# Patient Record
Sex: Female | Born: 1945 | Race: White | Hispanic: No | Smoking: Never smoker
Health system: Southern US, Community
[De-identification: ages and names within clinical notes are randomized; demographics above are authoritative.]

## PROBLEM LIST (undated history)

## (undated) DIAGNOSIS — M1990 Unspecified osteoarthritis, unspecified site: Secondary | ICD-10-CM

## (undated) DIAGNOSIS — G629 Polyneuropathy, unspecified: Secondary | ICD-10-CM

## (undated) DIAGNOSIS — H409 Unspecified glaucoma: Secondary | ICD-10-CM

## (undated) DIAGNOSIS — E785 Hyperlipidemia, unspecified: Secondary | ICD-10-CM

## (undated) DIAGNOSIS — M5412 Radiculopathy, cervical region: Secondary | ICD-10-CM

## (undated) DIAGNOSIS — F419 Anxiety disorder, unspecified: Secondary | ICD-10-CM

## (undated) DIAGNOSIS — E669 Obesity, unspecified: Secondary | ICD-10-CM

## (undated) DIAGNOSIS — F32A Depression, unspecified: Secondary | ICD-10-CM

## (undated) DIAGNOSIS — I10 Essential (primary) hypertension: Secondary | ICD-10-CM

## (undated) DIAGNOSIS — E119 Type 2 diabetes mellitus without complications: Secondary | ICD-10-CM

## (undated) DIAGNOSIS — T7840XA Allergy, unspecified, initial encounter: Secondary | ICD-10-CM

## (undated) DIAGNOSIS — M109 Gout, unspecified: Secondary | ICD-10-CM

## (undated) DIAGNOSIS — G4730 Sleep apnea, unspecified: Secondary | ICD-10-CM

## (undated) DIAGNOSIS — I129 Hypertensive chronic kidney disease with stage 1 through stage 4 chronic kidney disease, or unspecified chronic kidney disease: Secondary | ICD-10-CM

## (undated) DIAGNOSIS — K219 Gastro-esophageal reflux disease without esophagitis: Secondary | ICD-10-CM

## (undated) DIAGNOSIS — E1129 Type 2 diabetes mellitus with other diabetic kidney complication: Secondary | ICD-10-CM

## (undated) DIAGNOSIS — E042 Nontoxic multinodular goiter: Secondary | ICD-10-CM

## (undated) DIAGNOSIS — N189 Chronic kidney disease, unspecified: Secondary | ICD-10-CM

## (undated) HISTORY — DX: Type 2 diabetes mellitus without complications: E11.9

## (undated) HISTORY — PX: EYE SURGERY: SHX253

## (undated) HISTORY — DX: Depression, unspecified: F32.A

## (undated) HISTORY — DX: Essential (primary) hypertension: I10

## (undated) HISTORY — DX: Unspecified glaucoma: H40.9

## (undated) HISTORY — DX: Type 2 diabetes mellitus with other diabetic kidney complication: E11.29

## (undated) HISTORY — PX: CHOLECYSTECTOMY: SHX55

## (undated) HISTORY — DX: Chronic kidney disease, unspecified: N18.9

## (undated) HISTORY — DX: Hyperlipidemia, unspecified: E78.5

## (undated) HISTORY — PX: BREAST CYST ASPIRATION: SHX578

## (undated) HISTORY — PX: HERNIA REPAIR: SHX51

## (undated) HISTORY — DX: Sleep apnea, unspecified: G47.30

## (undated) HISTORY — DX: Anxiety disorder, unspecified: F41.9

## (undated) HISTORY — DX: Obesity, unspecified: E66.9

## (undated) HISTORY — PX: ABDOMINAL HYSTERECTOMY: SHX81

## (undated) HISTORY — DX: Gout, unspecified: M10.9

## (undated) HISTORY — DX: Allergy, unspecified, initial encounter: T78.40XA

## (undated) HISTORY — PX: SPINE SURGERY: SHX786

## (undated) HISTORY — DX: Hypertensive chronic kidney disease with stage 1 through stage 4 chronic kidney disease, or unspecified chronic kidney disease: I12.9

## (undated) HISTORY — PX: THUMB FUSION: SUR636

## (undated) HISTORY — DX: Gastro-esophageal reflux disease without esophagitis: K21.9

---

## 2009-08-09 LAB — HM COLONOSCOPY

## 2011-06-15 ENCOUNTER — Ambulatory Visit: Payer: Self-pay | Admitting: Nephrology

## 2011-12-17 ENCOUNTER — Ambulatory Visit: Payer: Self-pay

## 2012-06-16 ENCOUNTER — Ambulatory Visit: Payer: Self-pay

## 2013-01-29 DIAGNOSIS — H5200 Hypermetropia, unspecified eye: Secondary | ICD-10-CM | POA: Diagnosis not present

## 2013-01-29 DIAGNOSIS — H00029 Hordeolum internum unspecified eye, unspecified eyelid: Secondary | ICD-10-CM | POA: Diagnosis not present

## 2013-01-29 DIAGNOSIS — E119 Type 2 diabetes mellitus without complications: Secondary | ICD-10-CM | POA: Diagnosis not present

## 2013-04-15 DIAGNOSIS — Z23 Encounter for immunization: Secondary | ICD-10-CM | POA: Diagnosis not present

## 2013-04-15 DIAGNOSIS — Z1331 Encounter for screening for depression: Secondary | ICD-10-CM | POA: Diagnosis not present

## 2013-04-15 DIAGNOSIS — Z0000 Encounter for general adult medical examination without abnormal findings: Secondary | ICD-10-CM | POA: Diagnosis not present

## 2013-04-15 DIAGNOSIS — E119 Type 2 diabetes mellitus without complications: Secondary | ICD-10-CM | POA: Diagnosis not present

## 2013-04-15 DIAGNOSIS — E785 Hyperlipidemia, unspecified: Secondary | ICD-10-CM | POA: Diagnosis not present

## 2013-04-15 DIAGNOSIS — I10 Essential (primary) hypertension: Secondary | ICD-10-CM | POA: Diagnosis not present

## 2013-06-16 DIAGNOSIS — H3530 Unspecified macular degeneration: Secondary | ICD-10-CM | POA: Diagnosis not present

## 2013-06-17 ENCOUNTER — Ambulatory Visit: Payer: Self-pay

## 2013-06-17 DIAGNOSIS — Z1231 Encounter for screening mammogram for malignant neoplasm of breast: Secondary | ICD-10-CM | POA: Diagnosis not present

## 2013-09-21 DIAGNOSIS — I10 Essential (primary) hypertension: Secondary | ICD-10-CM | POA: Diagnosis not present

## 2013-09-21 DIAGNOSIS — E1165 Type 2 diabetes mellitus with hyperglycemia: Secondary | ICD-10-CM | POA: Diagnosis not present

## 2013-09-21 DIAGNOSIS — E1129 Type 2 diabetes mellitus with other diabetic kidney complication: Secondary | ICD-10-CM | POA: Diagnosis not present

## 2013-09-21 DIAGNOSIS — E119 Type 2 diabetes mellitus without complications: Secondary | ICD-10-CM | POA: Diagnosis not present

## 2013-09-21 DIAGNOSIS — I129 Hypertensive chronic kidney disease with stage 1 through stage 4 chronic kidney disease, or unspecified chronic kidney disease: Secondary | ICD-10-CM | POA: Diagnosis not present

## 2013-09-21 DIAGNOSIS — N181 Chronic kidney disease, stage 1: Secondary | ICD-10-CM | POA: Diagnosis not present

## 2013-09-21 DIAGNOSIS — E785 Hyperlipidemia, unspecified: Secondary | ICD-10-CM | POA: Diagnosis not present

## 2013-12-02 DIAGNOSIS — E119 Type 2 diabetes mellitus without complications: Secondary | ICD-10-CM | POA: Diagnosis not present

## 2013-12-02 DIAGNOSIS — H353190 Nonexudative age-related macular degeneration, unspecified eye, stage unspecified: Secondary | ICD-10-CM | POA: Diagnosis not present

## 2013-12-02 DIAGNOSIS — H2510 Age-related nuclear cataract, unspecified eye: Secondary | ICD-10-CM | POA: Diagnosis not present

## 2013-12-02 DIAGNOSIS — H25019 Cortical age-related cataract, unspecified eye: Secondary | ICD-10-CM | POA: Diagnosis not present

## 2013-12-22 DIAGNOSIS — H2510 Age-related nuclear cataract, unspecified eye: Secondary | ICD-10-CM | POA: Diagnosis not present

## 2013-12-22 DIAGNOSIS — H259 Unspecified age-related cataract: Secondary | ICD-10-CM | POA: Diagnosis not present

## 2013-12-22 DIAGNOSIS — H269 Unspecified cataract: Secondary | ICD-10-CM | POA: Diagnosis not present

## 2013-12-23 DIAGNOSIS — H2510 Age-related nuclear cataract, unspecified eye: Secondary | ICD-10-CM | POA: Diagnosis not present

## 2013-12-24 DIAGNOSIS — M79609 Pain in unspecified limb: Secondary | ICD-10-CM | POA: Diagnosis not present

## 2013-12-24 DIAGNOSIS — M659 Synovitis and tenosynovitis, unspecified: Secondary | ICD-10-CM | POA: Diagnosis not present

## 2014-01-18 DIAGNOSIS — H2510 Age-related nuclear cataract, unspecified eye: Secondary | ICD-10-CM | POA: Diagnosis not present

## 2014-01-26 DIAGNOSIS — H2510 Age-related nuclear cataract, unspecified eye: Secondary | ICD-10-CM | POA: Diagnosis not present

## 2014-02-16 DIAGNOSIS — H35359 Cystoid macular degeneration, unspecified eye: Secondary | ICD-10-CM | POA: Diagnosis not present

## 2014-04-20 DIAGNOSIS — Z23 Encounter for immunization: Secondary | ICD-10-CM | POA: Diagnosis not present

## 2014-04-20 DIAGNOSIS — Z0000 Encounter for general adult medical examination without abnormal findings: Secondary | ICD-10-CM | POA: Diagnosis not present

## 2014-04-20 DIAGNOSIS — E1121 Type 2 diabetes mellitus with diabetic nephropathy: Secondary | ICD-10-CM | POA: Diagnosis not present

## 2014-04-20 DIAGNOSIS — R000 Tachycardia, unspecified: Secondary | ICD-10-CM | POA: Diagnosis not present

## 2014-04-20 DIAGNOSIS — E669 Obesity, unspecified: Secondary | ICD-10-CM | POA: Diagnosis not present

## 2014-04-20 DIAGNOSIS — I129 Hypertensive chronic kidney disease with stage 1 through stage 4 chronic kidney disease, or unspecified chronic kidney disease: Secondary | ICD-10-CM | POA: Diagnosis not present

## 2014-06-08 DIAGNOSIS — H40013 Open angle with borderline findings, low risk, bilateral: Secondary | ICD-10-CM | POA: Diagnosis not present

## 2014-06-08 DIAGNOSIS — H3531 Nonexudative age-related macular degeneration: Secondary | ICD-10-CM | POA: Diagnosis not present

## 2014-06-08 LAB — HM DIABETES EYE EXAM

## 2014-07-20 DIAGNOSIS — G4733 Obstructive sleep apnea (adult) (pediatric): Secondary | ICD-10-CM | POA: Diagnosis not present

## 2014-07-20 DIAGNOSIS — I129 Hypertensive chronic kidney disease with stage 1 through stage 4 chronic kidney disease, or unspecified chronic kidney disease: Secondary | ICD-10-CM | POA: Diagnosis not present

## 2014-07-29 ENCOUNTER — Ambulatory Visit: Payer: Self-pay

## 2014-07-29 DIAGNOSIS — Z1231 Encounter for screening mammogram for malignant neoplasm of breast: Secondary | ICD-10-CM | POA: Diagnosis not present

## 2014-07-29 LAB — HM MAMMOGRAPHY

## 2014-11-02 DIAGNOSIS — E1121 Type 2 diabetes mellitus with diabetic nephropathy: Secondary | ICD-10-CM | POA: Diagnosis not present

## 2014-11-02 DIAGNOSIS — I129 Hypertensive chronic kidney disease with stage 1 through stage 4 chronic kidney disease, or unspecified chronic kidney disease: Secondary | ICD-10-CM | POA: Diagnosis not present

## 2014-11-02 DIAGNOSIS — E785 Hyperlipidemia, unspecified: Secondary | ICD-10-CM | POA: Diagnosis not present

## 2014-11-02 DIAGNOSIS — N181 Chronic kidney disease, stage 1: Secondary | ICD-10-CM | POA: Diagnosis not present

## 2014-11-02 LAB — HM DIABETES FOOT EXAM

## 2014-11-02 LAB — HEMOGLOBIN A1C: HEMOGLOBIN A1C: 6.7 % — AB (ref 4.0–6.0)

## 2014-11-02 LAB — BASIC METABOLIC PANEL: Glucose: 163 mg/dL

## 2014-12-01 ENCOUNTER — Encounter: Payer: Self-pay | Admitting: *Deleted

## 2014-12-01 ENCOUNTER — Encounter: Payer: Medicare Other | Attending: Unknown Physician Specialty | Admitting: *Deleted

## 2014-12-01 VITALS — BP 116/80 | Ht 65.0 in | Wt 222.3 lb

## 2014-12-01 DIAGNOSIS — E119 Type 2 diabetes mellitus without complications: Secondary | ICD-10-CM | POA: Diagnosis not present

## 2014-12-01 NOTE — Patient Instructions (Signed)
Check blood sugars 1 x day before breakfast or 2 hrs after supper every day  Exercise:  Continue walking for 15-30  minutes 1-2 days a week and gradually increase to 30 minutes 5 x week  Eat 3 meals day,   1-2  snacks a day Space meals 4-6 hours apart  Bring blood sugar records to the next class  Return for appointment/classes on:

## 2014-12-01 NOTE — Progress Notes (Signed)
Diabetes Self-Management Education  Visit Type: First/Initial  Appt. Start Time: 1020 Appt. End Time: 1130  12/01/2014  Catherine Hinton, identified by name and date of birth, is a 69 y.o. female with a diagnosis of Diabetes: Type 2.    ASSESSMENT Blood pressure 116/80, height 5\' 5"  (1.651 m), weight 222 lb 4.8 oz (100.835 kg). Body mass index is 36.99 kg/(m^2).  Initial Visit Information: Are you currently following a meal plan?: Yes What type of meal plan do you follow?: Limiting sweets and carbs Are you taking your medications as prescribed?: Yes Are you checking your feet?: Yes How many days per week are you checking your feet?: 7 How often do you need to have someone help you when you read instructions, pamphlets, or other written materials from your doctor or pharmacy?: 1 - Never What is the last grade level you completed in school?: college  Psychosocial: Patient Belief/Attitude about Diabetes: Other (comment) (Reports she needs to start making changes to help her health) Self-care barriers: None Self-management support: Family Patient Concerns: Nutrition/Meal planning, Monitoring, Healthy Lifestyle, Problem Solving, Glycemic Control, Weight Control Special Needs: None Preferred Learning Style: Auditory, Visual Learning Readiness: Ready  Complications:   Last HgB A1C per patient/outside source: 6.7 % (11/02/14) How often do you check your blood sugar?: 1-2 times/day Fasting Blood glucose range (mg/dL): 130-179 (Pt reports FBG's 130-140's with reading of 136 today) Have you had a dilated eye exam in the past 12 months?: Yes Have you had a dental exam in the past 12 months?: Yes  Diet Intake: Breakfast: cereal and milk; oatmeal; eggs bacon or sausage with toast Lunch: chicken, spaghetti, vegetables Dinner: left overs or cereal and milk; boiled egg Snack (evening): 0-1 snacks/day Beverage(s): mostly water  Exercise: Exercise: Light (walking) Light Exercise  amount of time (min / week): 40  Individualized Plan for Diabetes Self-Management Training:  Learning Objective:  Patient will have a greater understanding of diabetes self-management.  Education Topics Reviewed with Patient Today: Explored patient's options for treatment of their diabetes Role of diet in the treatment of diabetes and the relationship between the three main macronutrients and blood glucose level, Food label reading, portion sizes and measuring food. Role of exercise on diabetes management, blood pressure control and cardiac health. Reviewed patients medication for diabetes, action, purpose, timing of dose and side effects. Purpose and frequency of SMBG., Identified appropriate SMBG and/or A1C goals. Relationship between chronic complications and blood glucose control Identified and addressed patients feelings and concerns about diabetes  PATIENTS GOALS/Plan (Developed by the patient): Improve blood sugars Prevent diabetes complications Lose weight Lead a healthier lifestyle Become more fit  Plan:   Patient Instructions  Check blood sugars 1 x day before breakfast or 2 hrs after supper every day Exercise:  Continue walking for 15-30  minutes 1-2 days a week and gradually increase to 30 minutes 5 x week Eat 3 meals day,   1-2  snacks a day Space meals 4-6 hours apart Bring blood sugar records to the next class  Expected Outcomes:  Demonstrated interest in learning. Expect positive outcomes  Education material provided: General Meal Planning Guidelines  If problems or questions, patient to contact team via:  Catherine Hinton, [place], [place], CDE [phone]   Future DSME appointment:  Class 1 December 09, 2014

## 2014-12-09 ENCOUNTER — Ambulatory Visit: Payer: No Typology Code available for payment source

## 2014-12-09 ENCOUNTER — Encounter: Payer: Self-pay | Admitting: Dietician

## 2014-12-09 ENCOUNTER — Encounter: Payer: Medicare Other | Admitting: Dietician

## 2014-12-09 VITALS — Wt 217.4 lb

## 2014-12-09 DIAGNOSIS — E119 Type 2 diabetes mellitus without complications: Secondary | ICD-10-CM | POA: Diagnosis not present

## 2014-12-09 NOTE — Progress Notes (Signed)
Appt. Start Time: 9 Appt. End Time: 12  Class 1 Diabetes Overview - define DM; state own type of DM; identify functions of pancreas and insulin; define insulin deficiency vs insulin resistance  Psychosocial - identify DM as a source of stress; state the effects of stress on BG control; verbalize appropriate stress management techniques; identify personal stress issues   Nutritional Management - describe effects of food on blood glucose; identify sources of carbohydrate, protein and fat; verbalize the importance of balance meals in controlling blood glucose; identify meals as well balanced or not; estimate servings of carbohydrate from menus; use food labels to identify servings size, content of carbohydrate, fiber, protein, fat, saturated fat and sodium; recognize food sources of fat, saturated fat, trans fat, sodium and verbalize goals for intake; describe healthful appropriate food choices when dining out   Exercise - describe the effects of exercise on blood glucose and importance of regular exercise in controlling diabetes; state a plan for personal exercise; verbalize contraindications for exercise  Self-Monitoring - state importance of HBGM and demo procedure accurately; use HBGM results to effectively manage diabetes; identify importance of regular HbA1C testing and goals for results  Acute Complications/Sick Day Guidelines - recognize hyperglycemia and hypoglycemia with causes and effects; identify blood glucose results as high, low or in control; list steps in treating and preventing high and low blood glucose; state appropriate measure to manage blood glucose when ill (need for meds, HBGM plan, when to call physician, need for fluids)  Chronic Complications/Foot, Skin, Eye Dental Care - identify possible long-term complications of diabetes (retinopathy, neuropathy, nephropathy, cardiovascular disease, infections); explain steps in prevention and treatment of chronic complications; state  importance of daily self-foot exams; describe how to examine feet and what to look for; explain appropriate eye and dental care  Lifestyle Changes/Goals & Health/Community Resources - state benefits of making appropriate lifestyle changes; identify habits that need to change (meals, tobacco, alcohol); identify strategies to reduce risk factors for personal health; set goals for proper diabetes care; state need for and frequency of healthcare follow-up; describe appropriate community resources for good health (ADA, web sites, apps)   Pregnancy/Sexual Health - define gestational diabetes; state importance of good blood glucose control and birth control prior to pregnancy; state importance of good blood glucose control in preventing sexual problems (impotence, vaginal dryness, infections, loss of desire); state relationship of blood glucose control and pregnancy outcome; describe risk of maternal and fetal complications  Teaching Materials Used: Class 1 Slides/Notebook Diabetes Booklet ID Card  Medic Alert/Medic ID Forms Sleep Evaluation Exercise Handout Daily Food Record Planning a Balanced Meal Goals for Class 1

## 2014-12-15 DIAGNOSIS — H3531 Nonexudative age-related macular degeneration: Secondary | ICD-10-CM | POA: Diagnosis not present

## 2014-12-15 DIAGNOSIS — H40003 Preglaucoma, unspecified, bilateral: Secondary | ICD-10-CM | POA: Diagnosis not present

## 2014-12-15 LAB — HM DIABETES EYE EXAM

## 2014-12-16 ENCOUNTER — Encounter: Payer: Medicare Other | Admitting: *Deleted

## 2014-12-16 ENCOUNTER — Encounter: Payer: Self-pay | Admitting: *Deleted

## 2014-12-16 VITALS — Wt 214.5 lb

## 2014-12-16 DIAGNOSIS — E119 Type 2 diabetes mellitus without complications: Secondary | ICD-10-CM | POA: Diagnosis not present

## 2014-12-16 NOTE — Progress Notes (Signed)

## 2014-12-29 ENCOUNTER — Telehealth: Payer: Self-pay | Admitting: Unknown Physician Specialty

## 2014-12-29 NOTE — Telephone Encounter (Signed)
Refill will be filled out by provider and faxed back to company.

## 2014-12-29 NOTE — Telephone Encounter (Signed)
E-fax came through for refill on diabetic supplies. Copy of Rx in basket

## 2014-12-30 ENCOUNTER — Encounter: Payer: Medicare Other | Attending: Unknown Physician Specialty | Admitting: Dietician

## 2014-12-30 VITALS — BP 100/62 | Ht 65.0 in | Wt 209.9 lb

## 2014-12-30 DIAGNOSIS — E119 Type 2 diabetes mellitus without complications: Secondary | ICD-10-CM | POA: Insufficient documentation

## 2014-12-30 NOTE — Progress Notes (Signed)

## 2014-12-31 ENCOUNTER — Other Ambulatory Visit: Payer: Self-pay | Admitting: Unknown Physician Specialty

## 2015-01-04 ENCOUNTER — Encounter: Payer: Self-pay | Admitting: *Deleted

## 2015-01-21 DIAGNOSIS — H018 Other specified inflammations of eyelid: Secondary | ICD-10-CM | POA: Diagnosis not present

## 2015-01-21 DIAGNOSIS — H16223 Keratoconjunctivitis sicca, not specified as Sjogren's, bilateral: Secondary | ICD-10-CM | POA: Diagnosis not present

## 2015-02-01 ENCOUNTER — Other Ambulatory Visit: Payer: Self-pay | Admitting: Unknown Physician Specialty

## 2015-02-08 ENCOUNTER — Other Ambulatory Visit: Payer: Self-pay | Admitting: Unknown Physician Specialty

## 2015-02-09 ENCOUNTER — Other Ambulatory Visit: Payer: Self-pay | Admitting: Unknown Physician Specialty

## 2015-03-29 DIAGNOSIS — H04129 Dry eye syndrome of unspecified lacrimal gland: Secondary | ICD-10-CM | POA: Diagnosis not present

## 2015-04-11 ENCOUNTER — Other Ambulatory Visit: Payer: Self-pay

## 2015-04-11 MED ORDER — CYCLOBENZAPRINE HCL 10 MG PO TABS: 10.0000 mg | ORAL_TABLET | Freq: Every day | ORAL | Status: DC

## 2015-04-11 NOTE — Telephone Encounter (Signed)
Patient was last seen 11/02/14 and has follow-up scheduled in November. [facility] number is [phone] and pharmacy is [facility] in [place].

## 2015-04-18 ENCOUNTER — Other Ambulatory Visit: Payer: Self-pay | Admitting: Unknown Physician Specialty

## 2015-04-22 DIAGNOSIS — G4733 Obstructive sleep apnea (adult) (pediatric): Secondary | ICD-10-CM

## 2015-04-22 DIAGNOSIS — G4700 Insomnia, unspecified: Secondary | ICD-10-CM | POA: Insufficient documentation

## 2015-04-22 DIAGNOSIS — N181 Chronic kidney disease, stage 1: Secondary | ICD-10-CM

## 2015-04-22 DIAGNOSIS — E785 Hyperlipidemia, unspecified: Secondary | ICD-10-CM | POA: Insufficient documentation

## 2015-04-22 DIAGNOSIS — R000 Tachycardia, unspecified: Secondary | ICD-10-CM

## 2015-04-22 DIAGNOSIS — I129 Hypertensive chronic kidney disease with stage 1 through stage 4 chronic kidney disease, or unspecified chronic kidney disease: Secondary | ICD-10-CM

## 2015-04-22 DIAGNOSIS — E1140 Type 2 diabetes mellitus with diabetic neuropathy, unspecified: Secondary | ICD-10-CM | POA: Insufficient documentation

## 2015-04-22 DIAGNOSIS — E1121 Type 2 diabetes mellitus with diabetic nephropathy: Secondary | ICD-10-CM

## 2015-04-22 DIAGNOSIS — E669 Obesity, unspecified: Secondary | ICD-10-CM

## 2015-04-22 DIAGNOSIS — E1169 Type 2 diabetes mellitus with other specified complication: Secondary | ICD-10-CM | POA: Insufficient documentation

## 2015-04-25 ENCOUNTER — Encounter: Payer: Self-pay | Admitting: Unknown Physician Specialty

## 2015-04-25 ENCOUNTER — Ambulatory Visit (INDEPENDENT_AMBULATORY_CARE_PROVIDER_SITE_OTHER): Payer: Medicare Other | Admitting: Unknown Physician Specialty

## 2015-04-25 VITALS — BP 126/79 | HR 106 | Temp 98.8°F | Ht 64.9 in | Wt 197.4 lb

## 2015-04-25 DIAGNOSIS — N189 Chronic kidney disease, unspecified: Secondary | ICD-10-CM | POA: Insufficient documentation

## 2015-04-25 DIAGNOSIS — Z23 Encounter for immunization: Secondary | ICD-10-CM

## 2015-04-25 DIAGNOSIS — G4700 Insomnia, unspecified: Secondary | ICD-10-CM

## 2015-04-25 DIAGNOSIS — E785 Hyperlipidemia, unspecified: Secondary | ICD-10-CM | POA: Diagnosis not present

## 2015-04-25 DIAGNOSIS — E1121 Type 2 diabetes mellitus with diabetic nephropathy: Secondary | ICD-10-CM

## 2015-04-25 DIAGNOSIS — E669 Obesity, unspecified: Secondary | ICD-10-CM | POA: Diagnosis not present

## 2015-04-25 DIAGNOSIS — N181 Chronic kidney disease, stage 1: Secondary | ICD-10-CM

## 2015-04-25 DIAGNOSIS — Z0000 Encounter for general adult medical examination without abnormal findings: Secondary | ICD-10-CM | POA: Diagnosis not present

## 2015-04-25 DIAGNOSIS — E2839 Other primary ovarian failure: Secondary | ICD-10-CM | POA: Diagnosis not present

## 2015-04-25 DIAGNOSIS — I129 Hypertensive chronic kidney disease with stage 1 through stage 4 chronic kidney disease, or unspecified chronic kidney disease: Secondary | ICD-10-CM | POA: Diagnosis not present

## 2015-04-25 HISTORY — DX: Chronic kidney disease, unspecified: N18.9

## 2015-04-25 LAB — LIPID PANEL PICCOLO, WAIVED
Chol/HDL Ratio Piccolo,Waive: 3.6 mg/dL
Cholesterol Piccolo, Waived: 150 mg/dL (ref <200)
HDL CHOL PICCOLO, WAIVED: 42 mg/dL — AB (ref >59)
LDL Chol Calc Piccolo Waived: 82 mg/dL (ref <100)
Triglycerides Piccolo,Waived: 134 mg/dL (ref <150)
VLDL Chol Calc Piccolo,Waive: 27 mg/dL (ref <30)

## 2015-04-25 LAB — MICROALBUMIN, URINE WAIVED
CREATININE, URINE WAIVED: 50 mg/dL (ref 10–300)
Microalb, Ur Waived: 30 mg/L — ABNORMAL HIGH (ref 0–19)

## 2015-04-25 LAB — BAYER DCA HB A1C WAIVED: HB A1C (BAYER DCA - WAIVED): 5.7 % (ref <7.0)

## 2015-04-25 MED ORDER — CYCLOBENZAPRINE HCL 10 MG PO TABS: 10.0000 mg | ORAL_TABLET | Freq: Every day | ORAL | Status: DC

## 2015-04-25 NOTE — Assessment & Plan Note (Signed)
Stable, continue present medications.   

## 2015-04-25 NOTE — Assessment & Plan Note (Signed)
Hgb A1-C is 5.7

## 2015-04-25 NOTE — Assessment & Plan Note (Signed)
Reviewed lipid panel.  LDL was 89.  Continue present medications.

## 2015-04-25 NOTE — Progress Notes (Signed)
BP 126/79 mmHg  Pulse 106  Temp(Src) 98.8 F (37.1 C)  Ht 5' 4.9" (1.648 m)  Wt 197 lb 6.4 oz (89.54 kg)  BMI 32.97 kg/m2  SpO2 95%  LMP  (LMP Unknown)   Subjective:    [name], female    DOB: 1946/04/06, 69 y.o.   MRN: [id number]  HPI: Catherine Hinton is a 69 y.o. female  Chief Complaint  Patient presents with  . Medicare Wellness   Functional Status Survey: Is the patient deaf or have difficulty hearing?: No Does the patient have difficulty seeing, even when wearing glasses/contacts?: No (pt states she has a lot of problems with dry eye- sees Dr. [name]) Does the patient have difficulty concentrating, remembering, or making decisions?: No Does the patient have difficulty walking or climbing stairs?: No Does the patient have difficulty dressing or bathing?: No Does the patient have difficulty doing errands alone such as visiting a doctor's office or shopping?: No  Depression screen [facility] 2/9 04/25/2015 12/01/2014  Decreased Interest 0 0  Down, Depressed, Hopeless 0 0  PHQ - 2 Score 0 0    Fall Risk  04/25/2015 12/09/2014 12/01/2014  Falls in the past year? No No No    Diabetes:  Using medications without difficulties: Hypoglycemic episodes: none Hyperglycemic episodes: none Feet problems: "numbness gradually getting worse" and take Tylenol on occasion for pain Blood Sugars averaging: 107 or below Eye exam within last year: 2 weeks ago  Hypertension    Using medication without problems or lightheadedness Chest pain with exertion: no Edema: none Short of breath: no Monitoring BP at home? 120/70-80 Average home BPs:  Elevated Cholesterol: Using medications without problems Muscle aches: no Diet compliance: good Exercise: was good this summer Supplements? none Other complaints: no   Relevant past medical, surgical, family and social history reviewed and updated as indicated. Interim medical history since our last visit  reviewed. Allergies and medications reviewed and updated.  Review of Systems  Per HPI unless specifically indicated above     Objective:    BP 126/79 mmHg  Pulse 106  Temp(Src) 98.8 F (37.1 C)  Ht 5' 4.9" (1.648 m)  Wt 197 lb 6.4 oz (89.54 kg)  BMI 32.97 kg/m2  SpO2 95%  LMP  (LMP Unknown)  Wt Readings from Last 3 Encounters:  04/25/15 197 lb 6.4 oz (89.54 kg)  11/02/14 222 lb (100.699 kg)  12/30/14 209 lb 14.4 oz (95.21 kg)    Physical Exam  Constitutional: She is oriented to person, place, and time. She appears well-developed and well-nourished.  HENT:  Head: Normocephalic and atraumatic.  Eyes: Pupils are equal, round, and reactive to light. Right eye exhibits no discharge. Left eye exhibits no discharge. No scleral icterus.  Neck: Normal range of motion. Neck supple. Carotid bruit is not present. No thyromegaly present.  Cardiovascular: Normal rate, regular rhythm and normal heart sounds.  Exam reveals no gallop and no friction rub.   No murmur heard. Pulmonary/Chest: Effort normal and breath sounds normal. No respiratory distress. She has no wheezes. She has no rales.  Abdominal: Soft. Bowel sounds are normal. There is no tenderness. There is no rebound.  Genitourinary: No breast swelling, tenderness or discharge.  Musculoskeletal: Normal range of motion.  Lymphadenopathy:    She has no cervical adenopathy.  Neurological: She is alert and oriented to person, place, and time.  Skin: Skin is warm, dry and intact. No rash noted.  Psychiatric: She has a normal mood and affect.  Her speech is normal and behavior is normal. Judgment and thought content normal. Cognition and memory are normal.   DNR and MOST form completed.      Assessment & Plan:   Problem List Items Addressed This Visit      Unprioritized   Diabetes mellitus with renal manifestation ([place])    Hgb A1C is 5.7      Relevant Orders   Bayer DCA Hb A1c Waived   Hyperlipemia    Reviewed lipid panel.   LDL was 89.  Continue present medications.         Relevant Orders   Lipid Panel Piccolo, Waived   Obesity   Insomnia    Refill Flexeril.  Risks and benefits discussed and pt opted to stay on it.        Hypertensive kidney disease, stage 1    Stable, continue present medications.       Chronic kidney disease   Relevant Orders   Microalbumin, Urine Waived    Other Visit Diagnoses    Immunization due    -  Primary    Relevant Orders    Flu Vaccine QUAD 36+ mos IM (Completed)    Hypertensive kidney disease, stage 1-4 or unspecified chronic kidney disease ([place])        Relevant Orders    Uric acid    Comprehensive metabolic panel    Routine general medical examination at a health care facility        Relevant Orders    Hepatitis C antibody         Follow up plan: No Follow-up on file.

## 2015-04-25 NOTE — Assessment & Plan Note (Signed)
Refill Flexeril.  Risks and benefits discussed and pt opted to stay on it.

## 2015-04-26 ENCOUNTER — Encounter: Payer: Self-pay | Admitting: Unknown Physician Specialty

## 2015-04-26 LAB — COMPREHENSIVE METABOLIC PANEL
ALBUMIN: 4.6 g/dL (ref 3.6–4.8)
ALT: 17 IU/L (ref 0–32)
AST: 20 IU/L (ref 0–40)
Albumin/Globulin Ratio: 2 (ref 1.1–2.5)
Alkaline Phosphatase: 50 IU/L (ref 39–117)
BUN / CREAT RATIO: 25 (ref 11–26)
BUN: 15 mg/dL (ref 8–27)
Bilirubin Total: 0.4 mg/dL (ref 0.0–1.2)
CHLORIDE: 96 mmol/L — AB (ref 97–106)
CO2: 28 mmol/L (ref 18–29)
CREATININE: 0.6 mg/dL (ref 0.57–1.00)
Calcium: 9.7 mg/dL (ref 8.7–10.3)
GFR calc non Af Amer: 95 mL/min/{1.73_m2} (ref >59)
GFR, EST AFRICAN AMERICAN: 109 mL/min/{1.73_m2} (ref >59)
GLUCOSE: 97 mg/dL (ref 65–99)
Globulin, Total: 2.3 g/dL (ref 1.5–4.5)
POTASSIUM: 3.9 mmol/L (ref 3.5–5.2)
Sodium: 140 mmol/L (ref 136–144)
TOTAL PROTEIN: 6.9 g/dL (ref 6.0–8.5)

## 2015-04-26 LAB — URIC ACID: Uric Acid: 5.4 mg/dL (ref 2.5–7.1)

## 2015-04-26 LAB — HEPATITIS C ANTIBODY

## 2015-04-26 NOTE — Progress Notes (Signed)
Quick Note:  Normal labs. Patient notified by letter. ______ 

## 2015-05-16 ENCOUNTER — Other Ambulatory Visit: Payer: Self-pay | Admitting: Unknown Physician Specialty

## 2015-06-08 ENCOUNTER — Other Ambulatory Visit: Payer: Self-pay | Admitting: Unknown Physician Specialty

## 2015-07-21 DIAGNOSIS — H01021 Squamous blepharitis right upper eyelid: Secondary | ICD-10-CM | POA: Diagnosis not present

## 2015-07-21 DIAGNOSIS — H0102A Squamous blepharitis right eye, upper and lower eyelids: Secondary | ICD-10-CM | POA: Insufficient documentation

## 2015-07-21 DIAGNOSIS — Z7982 Long term (current) use of aspirin: Secondary | ICD-10-CM | POA: Diagnosis not present

## 2015-07-21 DIAGNOSIS — H01022 Squamous blepharitis right lower eyelid: Secondary | ICD-10-CM | POA: Diagnosis not present

## 2015-07-21 DIAGNOSIS — H5213 Myopia, bilateral: Secondary | ICD-10-CM | POA: Diagnosis not present

## 2015-07-21 DIAGNOSIS — I129 Hypertensive chronic kidney disease with stage 1 through stage 4 chronic kidney disease, or unspecified chronic kidney disease: Secondary | ICD-10-CM | POA: Diagnosis not present

## 2015-07-21 DIAGNOSIS — H04123 Dry eye syndrome of bilateral lacrimal glands: Secondary | ICD-10-CM | POA: Diagnosis not present

## 2015-07-21 DIAGNOSIS — Z79899 Other long term (current) drug therapy: Secondary | ICD-10-CM | POA: Diagnosis not present

## 2015-07-21 DIAGNOSIS — H01024 Squamous blepharitis left upper eyelid: Secondary | ICD-10-CM | POA: Diagnosis not present

## 2015-07-21 DIAGNOSIS — Z9841 Cataract extraction status, right eye: Secondary | ICD-10-CM | POA: Diagnosis not present

## 2015-07-21 DIAGNOSIS — Z961 Presence of intraocular lens: Secondary | ICD-10-CM | POA: Diagnosis not present

## 2015-07-21 DIAGNOSIS — H0289 Other specified disorders of eyelid: Secondary | ICD-10-CM | POA: Diagnosis not present

## 2015-07-21 DIAGNOSIS — H02834 Dermatochalasis of left upper eyelid: Secondary | ICD-10-CM | POA: Diagnosis not present

## 2015-07-21 DIAGNOSIS — Z7984 Long term (current) use of oral hypoglycemic drugs: Secondary | ICD-10-CM | POA: Diagnosis not present

## 2015-07-21 DIAGNOSIS — N181 Chronic kidney disease, stage 1: Secondary | ICD-10-CM | POA: Diagnosis not present

## 2015-07-21 DIAGNOSIS — H02831 Dermatochalasis of right upper eyelid: Secondary | ICD-10-CM | POA: Diagnosis not present

## 2015-07-21 DIAGNOSIS — H26491 Other secondary cataract, right eye: Secondary | ICD-10-CM | POA: Diagnosis not present

## 2015-07-21 DIAGNOSIS — Z882 Allergy status to sulfonamides status: Secondary | ICD-10-CM | POA: Diagnosis not present

## 2015-07-21 DIAGNOSIS — H01025 Squamous blepharitis left lower eyelid: Secondary | ICD-10-CM | POA: Diagnosis not present

## 2015-07-21 DIAGNOSIS — H26492 Other secondary cataract, left eye: Secondary | ICD-10-CM | POA: Diagnosis not present

## 2015-07-21 DIAGNOSIS — H26493 Other secondary cataract, bilateral: Secondary | ICD-10-CM | POA: Diagnosis not present

## 2015-07-21 DIAGNOSIS — E1122 Type 2 diabetes mellitus with diabetic chronic kidney disease: Secondary | ICD-10-CM | POA: Diagnosis not present

## 2015-07-21 DIAGNOSIS — Z9842 Cataract extraction status, left eye: Secondary | ICD-10-CM | POA: Diagnosis not present

## 2015-07-29 ENCOUNTER — Encounter: Payer: Self-pay | Admitting: *Deleted

## 2015-08-18 ENCOUNTER — Other Ambulatory Visit: Payer: Self-pay | Admitting: Unknown Physician Specialty

## 2015-08-18 DIAGNOSIS — Z1231 Encounter for screening mammogram for malignant neoplasm of breast: Secondary | ICD-10-CM

## 2015-08-22 ENCOUNTER — Other Ambulatory Visit: Payer: Self-pay | Admitting: Unknown Physician Specialty

## 2015-08-22 ENCOUNTER — Ambulatory Visit
Admission: RE | Admit: 2015-08-22 | Discharge: 2015-08-22 | Disposition: A | Discharge status: Home / Self Care | Payer: Medicare Other | Source: Ambulatory Visit | Attending: Unknown Physician Specialty | Admitting: Unknown Physician Specialty

## 2015-08-22 DIAGNOSIS — Z1231 Encounter for screening mammogram for malignant neoplasm of breast: Secondary | ICD-10-CM | POA: Diagnosis not present

## 2015-09-06 ENCOUNTER — Other Ambulatory Visit: Payer: Self-pay | Admitting: Unknown Physician Specialty

## 2015-09-22 ENCOUNTER — Other Ambulatory Visit: Payer: Self-pay | Admitting: Unknown Physician Specialty

## 2015-09-22 NOTE — Telephone Encounter (Signed)
Your patient 

## 2015-10-03 ENCOUNTER — Other Ambulatory Visit: Payer: Self-pay | Admitting: Unknown Physician Specialty

## 2015-10-24 ENCOUNTER — Other Ambulatory Visit: Payer: Self-pay | Admitting: Family Medicine

## 2015-10-26 ENCOUNTER — Encounter: Payer: Self-pay | Admitting: Unknown Physician Specialty

## 2015-10-26 ENCOUNTER — Ambulatory Visit (INDEPENDENT_AMBULATORY_CARE_PROVIDER_SITE_OTHER): Payer: Medicare Other | Admitting: Unknown Physician Specialty

## 2015-10-26 VITALS — BP 123/78 | HR 116 | Temp 98.3°F | Ht 64.3 in | Wt 206.6 lb

## 2015-10-26 DIAGNOSIS — I10 Essential (primary) hypertension: Secondary | ICD-10-CM

## 2015-10-26 DIAGNOSIS — N181 Chronic kidney disease, stage 1: Secondary | ICD-10-CM

## 2015-10-26 DIAGNOSIS — Z23 Encounter for immunization: Secondary | ICD-10-CM

## 2015-10-26 DIAGNOSIS — I152 Hypertension secondary to endocrine disorders: Secondary | ICD-10-CM | POA: Insufficient documentation

## 2015-10-26 DIAGNOSIS — E785 Hyperlipidemia, unspecified: Secondary | ICD-10-CM

## 2015-10-26 DIAGNOSIS — E1122 Type 2 diabetes mellitus with diabetic chronic kidney disease: Secondary | ICD-10-CM

## 2015-10-26 LAB — LIPID PANEL PICCOLO, WAIVED
CHOL/HDL RATIO PICCOLO,WAIVE: 3.5 mg/dL
Cholesterol Piccolo, Waived: 140 mg/dL (ref <200)
HDL CHOL PICCOLO, WAIVED: 40 mg/dL — AB (ref >59)
LDL CHOL CALC PICCOLO WAIVED: 74 mg/dL (ref <100)
Triglycerides Piccolo,Waived: 128 mg/dL (ref <150)
VLDL CHOL CALC PICCOLO,WAIVE: 26 mg/dL (ref <30)

## 2015-10-26 LAB — BAYER DCA HB A1C WAIVED: HB A1C (BAYER DCA - WAIVED): 5.7 % (ref <7.0)

## 2015-10-26 NOTE — Assessment & Plan Note (Addendum)
Hgb A1C is 5.7.  Continue present medications

## 2015-10-26 NOTE — Assessment & Plan Note (Signed)
Stable, continue present medications.   

## 2015-10-26 NOTE — Patient Instructions (Signed)
Pneumococcal Polysaccharide Vaccine: What You Need to Know  1. Why get vaccinated?  Vaccination can protect older adults (and some children and younger adults) from pneumococcal disease.  Pneumococcal disease is caused by bacteria that can spread from person to person through close contact. It can cause ear infections, and it can also lead to more serious infections of the:   · Lungs (pneumonia),  · Blood (bacteremia), and  · Covering of the brain and spinal cord (meningitis). Meningitis can cause deafness and brain damage, and it can be fatal.  Anyone can get pneumococcal disease, but children under 2 years of age, people with certain medical conditions, adults over 65 years of age, and cigarette smokers are at the highest risk.  About 18,000 older adults die each year from pneumococcal disease in the [place].  Treatment of pneumococcal infections with penicillin and other drugs used to be more effective. But some strains of the disease have become resistant to these drugs. This makes prevention of the disease, through vaccination, even more important.  2. Pneumococcal polysaccharide vaccine (PPSV23)  Pneumococcal polysaccharide vaccine (PPSV23) protects against 23 types of pneumococcal bacteria. It will not prevent all pneumococcal disease.  PPSV23 is recommended for:  · All adults 65 years of age and older,  · Anyone 2 through 68 years of age with certain long-term health problems,  · Anyone 2 through 68 years of age with a weakened immune system,  · Adults 19 through 68 years of age who smoke cigarettes or have asthma.  Most people need only one dose of PPSV. A second dose is recommended for certain high-risk groups. People 65 and older should get a dose even if they have gotten one or more doses of the vaccine before they turned 65.  Your healthcare provider can give you more information about these recommendations.  Most healthy adults develop protection within 2 to 3 weeks of getting the shot.  3. Some  people should not get this vaccine  · Anyone who has had a life-threatening allergic reaction to PPSV should not get another dose.  · Anyone who has a severe allergy to any component of PPSV should not receive it. Tell your provider if you have any severe allergies.  · Anyone who is moderately or severely ill when the shot is scheduled may be asked to wait until they recover before getting the vaccine. Someone with a mild illness can usually be vaccinated.  · Children less than 2 years of age should not receive this vaccine.  · There is no evidence that PPSV is harmful to either a pregnant woman or to her fetus. However, as a precaution, women who need the vaccine should be vaccinated before becoming pregnant, if possible.  4. Risks of a vaccine reaction  With any medicine, including vaccines, there is a chance of side effects. These are usually mild and go away on their own, but serious reactions are also possible.  About half of people who get PPSV have mild side effects, such as redness or pain where the shot is given, which go away within about two days.  Less than 1 out of 100 people develop a fever, muscle aches, or more severe local reactions.  Problems that could happen after any vaccine:  · People sometimes faint after a medical procedure, including vaccination. Sitting or lying down for about 15 minutes can help prevent fainting, and injuries caused by a fall. Tell your doctor if you feel dizzy, or have vision changes or   ringing in the ears.  · Some people get severe pain in the shoulder and have difficulty moving the arm where a shot was given. This happens very rarely.  · Any medication can cause a severe allergic reaction. Such reactions from a vaccine are very rare, estimated at about 1 in a million doses, and would happen within a few minutes to a few hours after the vaccination.  As with any medicine, there is a very remote chance of a vaccine causing a serious injury or death.  The safety of  vaccines is always being monitored. For more information, visit: www.cdc.gov/vaccinesafety/  5. What if there is a serious reaction?  What should I look for?  Look for anything that concerns you, such as signs of a severe allergic reaction, very high fever, or unusual behavior.   Signs of a severe allergic reaction can include hives, swelling of the face and throat, difficulty breathing, a fast heartbeat, dizziness, and weakness. These would usually start a few minutes to a few hours after the vaccination.  What should I do?  If you think it is a severe allergic reaction or other emergency that can't wait, call 9-1-1 or get to the nearest hospital. Otherwise, call your doctor.  Afterward, the reaction should be reported to the Vaccine Adverse Event Reporting System (VAERS). Your doctor might file this report, or you can do it yourself through the VAERS web site at www.vaers.hhs.gov, or by calling 1-800-822-7967.   VAERS does not give medical advice.  6. How can I learn more?  · Ask your doctor. He or she can give you the vaccine package insert or suggest other sources of information.  · Call your local or state health department.  · Contact the Centers for Disease Control and Prevention (CDC):    Call 1-800-232-4636 (1-800-CDC-INFO) or    Visit CDC's website at www.cdc.gov/vaccines  CDC Pneumococcal Polysaccharide Vaccine VIS (10/09/13)     This information is not intended to replace advice given to you by your health care provider. Make sure you discuss any questions you have with your health care provider.     Document Released: 04/01/2006 Document Revised: 06/25/2014 Document Reviewed: 10/12/2013  Elsevier Interactive Patient Education ©2016 [employer] Inc[employer]

## 2015-10-26 NOTE — Progress Notes (Signed)
BP 123/78 mmHg  Pulse 116  Temp(Src) 98.3 F (36.8 C)  Ht 5' 4.3" (1.633 m)  Wt 206 lb 9.6 oz (93.713 kg)  BMI 35.14 kg/m2  SpO2 95%  LMP  (LMP Unknown)   Subjective:    [name], female    DOB: [birth year], 70 y.o.   MRN: [id number]  HPI: Catherine Hinton is a 70 y.o. female  Chief Complaint  Patient presents with  . Diabetes    pt states she had eye exam in Nov 2016, will fax paper to them  . Hypertension   Diabetes:  Using medications without difficulties No hypoglycemic episodes: No hyperglycemic episodes: Feet problems:none Blood Sugars averaging: 110 in the AM eye exam within last year  Hypertension:  Using medications without difficulty Average home BPs 124/72   Using medication without problems or lightheadedness No chest pain with exertion or shortness of breath No Edema  Elevated Cholesterol: Using medications without problems No Muscle aches Diet compliance: Eats well most of the time Exercise: stopped walking  Relevant past medical, surgical, family and social history reviewed and updated as indicated. Interim medical history since our last visit reviewed. Allergies and medications reviewed and updated.  Review of Systems  Per HPI unless specifically indicated above     Objective:    BP 123/78 mmHg  Pulse 116  Temp(Src) 98.3 F (36.8 C)  Ht 5' 4.3" (1.633 m)  Wt 206 lb 9.6 oz (93.713 kg)  BMI 35.14 kg/m2  SpO2 95%  LMP  (LMP Unknown)  Wt Readings from Last 3 Encounters:  10/26/15 206 lb 9.6 oz (93.713 kg)  04/25/15 197 lb 6.4 oz (89.54 kg)  11/02/14 222 lb (100.699 kg)    Physical Exam  Constitutional: She is oriented to person, place, and time. She appears well-developed and well-nourished. No distress.  HENT:  Head: Normocephalic and atraumatic.  Eyes: Conjunctivae and lids are normal. Right eye exhibits no discharge. Left eye exhibits no discharge. No scleral icterus.  Neck: Normal range of motion. Neck  supple. No JVD present. Carotid bruit is not present.  Cardiovascular: Normal rate, regular rhythm and normal heart sounds.   Pulmonary/Chest: Effort normal and breath sounds normal.  Abdominal: Normal appearance. There is no splenomegaly or hepatomegaly.  Musculoskeletal: Normal range of motion.  Neurological: She is alert and oriented to person, place, and time.  Skin: Skin is warm, dry and intact. No rash noted. No pallor.  Psychiatric: She has a normal mood and affect. Her behavior is normal. Judgment and thought content normal.    Results for orders placed or performed in visit on 04/25/15  Bayer DCA Hb A1c Waived  Result Value Ref Range   Bayer DCA Hb A1c Waived 5.7 <7.0 %  Lipid Panel Piccolo, Waived  Result Value Ref Range   Cholesterol Piccolo, Waived 150 <200 mg/dL   HDL Chol Piccolo, Waived 42 (L) >59 mg/dL   Triglycerides Piccolo,Waived 134 <150 mg/dL   Chol/HDL Ratio Piccolo,Waive 3.6 mg/dL   LDL Chol Calc Piccolo Waived 82 <100 mg/dL   VLDL Chol Calc Piccolo,Waive 27 <30 mg/dL  Microalbumin, Urine Waived  Result Value Ref Range   Microalb, Ur Waived 30 (H) 0 - 19 mg/L   Creatinine, Urine Waived 50 10 - 300 mg/dL   Microalb/Creat Ratio 30-300 (H) <30 mg/g  Uric acid  Result Value Ref Range   Uric Acid 5.4 2.5 - 7.1 mg/dL  Comprehensive metabolic panel  Result Value Ref Range   Glucose 97  65 - 99 mg/dL   BUN 15 8 - 27 mg/dL   Creatinine, Ser 0.60 0.57 - 1.00 mg/dL   GFR calc non Af Amer 95 >59 mL/min/1.73   GFR calc Af Amer 109 >59 mL/min/1.73   BUN/Creatinine Ratio 25 11 - 26   Sodium 140 136 - 144 mmol/L   Potassium 3.9 3.5 - 5.2 mmol/L   Chloride 96 (L) 97 - 106 mmol/L   CO2 28 18 - 29 mmol/L   Calcium 9.7 8.7 - 10.3 mg/dL   Total Protein 6.9 6.0 - 8.5 g/dL   Albumin 4.6 3.6 - 4.8 g/dL   Globulin, Total 2.3 1.5 - 4.5 g/dL   Albumin/Globulin Ratio 2.0 1.1 - 2.5   Bilirubin Total 0.4 0.0 - 1.2 mg/dL   Alkaline Phosphatase 50 39 - 117 IU/L   AST 20 0 - 40  IU/L   ALT 17 0 - 32 IU/L  Hepatitis C antibody  Result Value Ref Range   Hep C Virus Ab <0.1 0.0 - 0.9 s/co ratio      Assessment & Plan:   Problem List Items Addressed This Visit      Unprioritized   Diabetes mellitus with renal manifestation ([facility])    Hgb A1C is 5.7.  Continue present medications      Relevant Orders   Bayer DCA Hb A1c Waived   Hyperlipemia    Triglycerides 128 and LDL 73.  Continue presend meds      Relevant Orders   Lipid Panel Piccolo, Waived   Hypertension    Stable, continue present medications.        Relevant Orders   Comprehensive metabolic panel    Other Visit Diagnoses    Need for pneumococcal vaccination    -  Primary    Relevant Orders    Pneumococcal polysaccharide vaccine 23-valent greater than or equal to 2yo subcutaneous/IM (Completed)        Follow up plan: Return in about 6 months (around 04/27/2016) for PE.

## 2015-10-26 NOTE — Assessment & Plan Note (Signed)
Triglycerides 128 and LDL 73.  Continue presend meds

## 2015-10-27 LAB — COMPREHENSIVE METABOLIC PANEL
A/G RATIO: 2 (ref 1.2–2.2)
ALT: 16 IU/L (ref 0–32)
AST: 17 IU/L (ref 0–40)
Albumin: 4.7 g/dL (ref 3.6–4.8)
Alkaline Phosphatase: 49 IU/L (ref 39–117)
BUN/Creatinine Ratio: 22 (ref 12–28)
BUN: 13 mg/dL (ref 8–27)
Bilirubin Total: 0.5 mg/dL (ref 0.0–1.2)
CALCIUM: 9.9 mg/dL (ref 8.7–10.3)
CHLORIDE: 96 mmol/L (ref 96–106)
CO2: 29 mmol/L (ref 18–29)
CREATININE: 0.6 mg/dL (ref 0.57–1.00)
GFR, EST AFRICAN AMERICAN: 109 mL/min/{1.73_m2} (ref >59)
GFR, EST NON AFRICAN AMERICAN: 95 mL/min/{1.73_m2} (ref >59)
GLOBULIN, TOTAL: 2.3 g/dL (ref 1.5–4.5)
Glucose: 167 mg/dL — ABNORMAL HIGH (ref 65–99)
POTASSIUM: 4.4 mmol/L (ref 3.5–5.2)
SODIUM: 142 mmol/L (ref 134–144)
TOTAL PROTEIN: 7 g/dL (ref 6.0–8.5)

## 2015-12-12 DIAGNOSIS — H401130 Primary open-angle glaucoma, bilateral, stage unspecified: Secondary | ICD-10-CM | POA: Diagnosis not present

## 2015-12-12 DIAGNOSIS — H018 Other specified inflammations of eyelid: Secondary | ICD-10-CM | POA: Diagnosis not present

## 2015-12-12 DIAGNOSIS — H40019 Open angle with borderline findings, low risk, unspecified eye: Secondary | ICD-10-CM | POA: Diagnosis not present

## 2015-12-12 DIAGNOSIS — H04129 Dry eye syndrome of unspecified lacrimal gland: Secondary | ICD-10-CM | POA: Diagnosis not present

## 2015-12-12 DIAGNOSIS — H353131 Nonexudative age-related macular degeneration, bilateral, early dry stage: Secondary | ICD-10-CM | POA: Diagnosis not present

## 2016-01-24 ENCOUNTER — Other Ambulatory Visit: Payer: Self-pay | Admitting: Unknown Physician Specialty

## 2016-01-31 DIAGNOSIS — H353130 Nonexudative age-related macular degeneration, bilateral, stage unspecified: Secondary | ICD-10-CM | POA: Diagnosis not present

## 2016-01-31 DIAGNOSIS — H018 Other specified inflammations of eyelid: Secondary | ICD-10-CM | POA: Diagnosis not present

## 2016-01-31 DIAGNOSIS — H40019 Open angle with borderline findings, low risk, unspecified eye: Secondary | ICD-10-CM | POA: Diagnosis not present

## 2016-01-31 DIAGNOSIS — H04129 Dry eye syndrome of unspecified lacrimal gland: Secondary | ICD-10-CM | POA: Diagnosis not present

## 2016-01-31 LAB — HM DIABETES EYE EXAM

## 2016-02-08 ENCOUNTER — Other Ambulatory Visit: Payer: Self-pay | Admitting: Unknown Physician Specialty

## 2016-02-08 NOTE — Telephone Encounter (Signed)
Your patient 

## 2016-04-02 ENCOUNTER — Other Ambulatory Visit: Payer: Self-pay | Admitting: Unknown Physician Specialty

## 2016-04-06 ENCOUNTER — Encounter (INDEPENDENT_AMBULATORY_CARE_PROVIDER_SITE_OTHER): Payer: Self-pay

## 2016-04-06 NOTE — Telephone Encounter (Signed)
Your patient 

## 2016-04-25 ENCOUNTER — Telehealth: Payer: Self-pay

## 2016-04-25 ENCOUNTER — Ambulatory Visit (INDEPENDENT_AMBULATORY_CARE_PROVIDER_SITE_OTHER): Payer: Medicare Other | Admitting: Unknown Physician Specialty

## 2016-04-25 ENCOUNTER — Encounter: Payer: Self-pay | Admitting: Unknown Physician Specialty

## 2016-04-25 VITALS — BP 130/78 | HR 105 | Temp 98.2°F | Ht 64.7 in | Wt 209.4 lb

## 2016-04-25 DIAGNOSIS — Z23 Encounter for immunization: Secondary | ICD-10-CM

## 2016-04-25 DIAGNOSIS — Z0000 Encounter for general adult medical examination without abnormal findings: Secondary | ICD-10-CM | POA: Diagnosis not present

## 2016-04-25 DIAGNOSIS — E7800 Pure hypercholesterolemia, unspecified: Secondary | ICD-10-CM

## 2016-04-25 DIAGNOSIS — I10 Essential (primary) hypertension: Secondary | ICD-10-CM

## 2016-04-25 DIAGNOSIS — G4733 Obstructive sleep apnea (adult) (pediatric): Secondary | ICD-10-CM | POA: Diagnosis not present

## 2016-04-25 DIAGNOSIS — E1140 Type 2 diabetes mellitus with diabetic neuropathy, unspecified: Secondary | ICD-10-CM

## 2016-04-25 LAB — MICROALBUMIN, URINE WAIVED
Creatinine, Urine Waived: 50 mg/dL (ref 10–300)
Microalb, Ur Waived: 10 mg/L (ref 0–19)
Microalb/Creat Ratio: <30 mg/g (ref <30)

## 2016-04-25 LAB — BAYER DCA HB A1C WAIVED: HB A1C: 6 % (ref <7.0)

## 2016-04-25 NOTE — Assessment & Plan Note (Addendum)
AM blood sugars are good.  Check Hgb A1C

## 2016-04-25 NOTE — Patient Instructions (Addendum)

## 2016-04-25 NOTE — Progress Notes (Signed)
BP 130/78 (BP Location: Left Arm, Patient Position: Sitting, Cuff Size: Large)   Pulse (!) 105   Temp 98.2 F (36.8 C)   Ht 5' 4.7" (1.643 m)   Wt 209 lb 6.4 oz (95 kg)   LMP  (LMP Unknown)   SpO2 97%   BMI 35.17 kg/m    Subjective:    [name], female    DOB: May 07, 1946, 70 y.o.   MRN: [id number]  HPI: Catherine Hinton is a 70 y.o. female  Chief Complaint  Patient presents with  . Medicare Wellness   Functional Status Survey: Is the patient deaf or have difficulty hearing?: No Does the patient have difficulty seeing, even when wearing glasses/contacts?: No Does the patient have difficulty concentrating, remembering, or making decisions?: No Does the patient have difficulty walking or climbing stairs?: No Does the patient have difficulty dressing or bathing?: No Does the patient have difficulty doing errands alone such as visiting a doctor's office or shopping?: No  Fall Risk  04/25/2016 04/25/2015 12/09/2014 12/01/2014  Falls in the past year? No No No No   Depression screen [facility] 2/9 04/25/2016 04/25/2015 12/01/2014  Decreased Interest 0 0 0  Down, Depressed, Hopeless 0 0 0  PHQ - 2 Score 0 0 0  Altered sleeping 0 - -  Tired, decreased energy 1 - -  Change in appetite 1 - -  Feeling bad or failure about yourself  0 - -  Trouble concentrating 0 - -  Moving slowly or fidgety/restless 0 - -  Suicidal thoughts 0 - -  PHQ-9 Score 2 - -   minicog is negative  Diabetes: Using medications without difficulties No hypoglycemic episodes No hyperglycemic episodes Feet problems:"hurt all the time" Blood Sugars averaging:110 eye exam within last year Last Hgb A1C:  5.7  Hypertension  Using medications without difficulty Average home BPs Not checking   Using medication without problems or lightheadedness No chest pain with exertion or shortness of breath No Edema  Elevated Cholesterol Using medications without problems No Muscle aches  Diet: Not good  but doing better Exercise:Not exercising daily.  Maybe 2 days/week  Relevant past medical, surgical, family and social history reviewed and updated as indicated. Interim medical history since our last visit reviewed. Allergies and medications reviewed and updated.  Review of Systems  Per HPI unless specifically indicated above     Objective:    BP 130/78 (BP Location: Left Arm, Patient Position: Sitting, Cuff Size: Large)   Pulse (!) 105   Temp 98.2 F (36.8 C)   Ht 5' 4.7" (1.643 m)   Wt 209 lb 6.4 oz (95 kg)   LMP  (LMP Unknown)   SpO2 97%   BMI 35.17 kg/m   Wt Readings from Last 3 Encounters:  04/25/16 209 lb 6.4 oz (95 kg)  10/26/15 206 lb 9.6 oz (93.7 kg)  04/25/15 197 lb 6.4 oz (89.5 kg)    Physical Exam  Constitutional: She is oriented to person, place, and time. She appears well-developed and well-nourished.  HENT:  Head: Normocephalic and atraumatic.  Eyes: Pupils are equal, round, and reactive to light. Right eye exhibits no discharge. Left eye exhibits no discharge. No scleral icterus.  Neck: Normal range of motion. Neck supple. Carotid bruit is not present. No thyromegaly present.  Cardiovascular: Normal rate, regular rhythm and normal heart sounds.  Exam reveals no gallop and no friction rub.   No murmur heard. Pulmonary/Chest: Effort normal and breath sounds normal. No respiratory  distress. She has no wheezes. She has no rales.  Abdominal: Soft. Bowel sounds are normal. There is no tenderness. There is no rebound.  Genitourinary: No breast swelling, tenderness or discharge.  Musculoskeletal: Normal range of motion.  Lymphadenopathy:    She has no cervical adenopathy.  Neurological: She is alert and oriented to person, place, and time.  Skin: Skin is warm, dry and intact. No rash noted.  Psychiatric: She has a normal mood and affect. Her speech is normal and behavior is normal. Judgment and thought content normal. Cognition and memory are normal.    Results  for orders placed or performed in visit on 10/28/15  HM DIABETES EYE EXAM  Result Value Ref Range   HM Diabetic Eye Exam No Retinopathy No Retinopathy      Assessment & Plan:   Problem List Items Addressed This Visit      Unprioritized   Controlled type 2 diabetes with neuropathy ([place])    AM blood sugars are good.  Check Hgb A1C      Relevant Orders   Bayer DCA Hb A1c Waived   Microalbumin, Urine Waived   Hyperlipemia    Check lipid panel.  Good cholesterol reading and discussed guidelines      Relevant Orders   Lipid Panel w/o Chol/HDL Ratio   Hypertension    Stable, continue present medications.        Relevant Orders   Comprehensive metabolic panel   Obstructive sleep apnea    Using Cpap       Other Visit Diagnoses    Need for influenza vaccination    -  Primary   Relevant Orders   Flu vaccine HIGH DOSE PF (Completed)   Routine general medical examination at a health care facility           Follow up plan: Return in about 6 months (around 10/23/2016).

## 2016-04-25 NOTE — Assessment & Plan Note (Addendum)
Check lipid panel.  Good cholesterol reading and discussed guidelines

## 2016-04-25 NOTE — Assessment & Plan Note (Signed)
Using Cpap

## 2016-04-25 NOTE — Assessment & Plan Note (Signed)
Stable, continue present medications.   

## 2016-04-25 NOTE — Telephone Encounter (Signed)
Patient came in for physical and stated she had an eye exam in August. I had faxed the request form to Dr. Waynetta Sandy office previously with no response so I called and asked when the patient's last eye exam was and they said it was 01/31/16. Documented in chart.

## 2016-04-26 ENCOUNTER — Encounter: Payer: Self-pay | Admitting: Unknown Physician Specialty

## 2016-04-26 LAB — LIPID PANEL W/O CHOL/HDL RATIO
Cholesterol, Total: 141 mg/dL (ref 100–199)
HDL: 40 mg/dL (ref >39)
LDL Calculated: 76 mg/dL (ref 0–99)
TRIGLYCERIDES: 126 mg/dL (ref 0–149)
VLDL Cholesterol Cal: 25 mg/dL (ref 5–40)

## 2016-04-26 LAB — COMPREHENSIVE METABOLIC PANEL
A/G RATIO: 1.9 (ref 1.2–2.2)
ALT: 16 IU/L (ref 0–32)
AST: 16 IU/L (ref 0–40)
Albumin: 4.7 g/dL (ref 3.6–4.8)
Alkaline Phosphatase: 52 IU/L (ref 39–117)
BILIRUBIN TOTAL: 0.4 mg/dL (ref 0.0–1.2)
BUN/Creatinine Ratio: 19 (ref 12–28)
BUN: 11 mg/dL (ref 8–27)
CHLORIDE: 98 mmol/L (ref 96–106)
CO2: 28 mmol/L (ref 18–29)
Calcium: 9.8 mg/dL (ref 8.7–10.3)
Creatinine, Ser: 0.58 mg/dL (ref 0.57–1.00)
GFR calc Af Amer: 110 mL/min/{1.73_m2} (ref >59)
GFR calc non Af Amer: 95 mL/min/{1.73_m2} (ref >59)
GLOBULIN, TOTAL: 2.5 g/dL (ref 1.5–4.5)
Glucose: 93 mg/dL (ref 65–99)
POTASSIUM: 4.5 mmol/L (ref 3.5–5.2)
SODIUM: 142 mmol/L (ref 134–144)
Total Protein: 7.2 g/dL (ref 6.0–8.5)

## 2016-08-08 DIAGNOSIS — H04129 Dry eye syndrome of unspecified lacrimal gland: Secondary | ICD-10-CM | POA: Diagnosis not present

## 2016-08-08 DIAGNOSIS — H401130 Primary open-angle glaucoma, bilateral, stage unspecified: Secondary | ICD-10-CM | POA: Diagnosis not present

## 2016-08-08 DIAGNOSIS — H018 Other specified inflammations of eyelid: Secondary | ICD-10-CM | POA: Diagnosis not present

## 2016-08-08 DIAGNOSIS — H353131 Nonexudative age-related macular degeneration, bilateral, early dry stage: Secondary | ICD-10-CM | POA: Diagnosis not present

## 2016-08-08 DIAGNOSIS — H40019 Open angle with borderline findings, low risk, unspecified eye: Secondary | ICD-10-CM | POA: Diagnosis not present

## 2016-08-08 LAB — HM DIABETES EYE EXAM

## 2016-08-16 ENCOUNTER — Other Ambulatory Visit: Payer: Self-pay | Admitting: Unknown Physician Specialty

## 2016-08-16 DIAGNOSIS — Z1231 Encounter for screening mammogram for malignant neoplasm of breast: Secondary | ICD-10-CM

## 2016-08-22 ENCOUNTER — Other Ambulatory Visit: Payer: Self-pay | Admitting: Family Medicine

## 2016-08-22 ENCOUNTER — Other Ambulatory Visit: Payer: Self-pay | Admitting: Unknown Physician Specialty

## 2016-09-12 ENCOUNTER — Ambulatory Visit
Admission: RE | Admit: 2016-09-12 | Discharge: 2016-09-12 | Disposition: A | Discharge status: Home / Self Care | Payer: Medicare Other | Source: Ambulatory Visit | Attending: Unknown Physician Specialty | Admitting: Unknown Physician Specialty

## 2016-09-12 DIAGNOSIS — Z1231 Encounter for screening mammogram for malignant neoplasm of breast: Secondary | ICD-10-CM | POA: Diagnosis not present

## 2016-09-13 ENCOUNTER — Other Ambulatory Visit: Payer: Self-pay | Admitting: Unknown Physician Specialty

## 2016-09-13 DIAGNOSIS — R928 Other abnormal and inconclusive findings on diagnostic imaging of breast: Secondary | ICD-10-CM

## 2016-09-13 DIAGNOSIS — N6489 Other specified disorders of breast: Secondary | ICD-10-CM

## 2016-09-28 ENCOUNTER — Ambulatory Visit
Admission: RE | Admit: 2016-09-28 | Discharge: 2016-09-28 | Disposition: A | Discharge status: Home / Self Care | Payer: Medicare Other | Source: Ambulatory Visit | Attending: Unknown Physician Specialty | Admitting: Unknown Physician Specialty

## 2016-09-28 DIAGNOSIS — N6489 Other specified disorders of breast: Secondary | ICD-10-CM

## 2016-09-28 DIAGNOSIS — N6311 Unspecified lump in the right breast, upper outer quadrant: Secondary | ICD-10-CM | POA: Diagnosis not present

## 2016-09-28 DIAGNOSIS — R918 Other nonspecific abnormal finding of lung field: Secondary | ICD-10-CM | POA: Diagnosis not present

## 2016-09-28 DIAGNOSIS — R928 Other abnormal and inconclusive findings on diagnostic imaging of breast: Secondary | ICD-10-CM | POA: Diagnosis present

## 2016-10-17 ENCOUNTER — Other Ambulatory Visit: Payer: Self-pay | Admitting: Unknown Physician Specialty

## 2016-10-23 ENCOUNTER — Ambulatory Visit (INDEPENDENT_AMBULATORY_CARE_PROVIDER_SITE_OTHER): Payer: Medicare Other | Admitting: Unknown Physician Specialty

## 2016-10-23 ENCOUNTER — Encounter: Payer: Self-pay | Admitting: Unknown Physician Specialty

## 2016-10-23 VITALS — BP 116/73 | HR 116 | Temp 98.9°F | Ht 64.7 in | Wt 212.8 lb

## 2016-10-23 DIAGNOSIS — R7301 Impaired fasting glucose: Secondary | ICD-10-CM | POA: Diagnosis not present

## 2016-10-23 DIAGNOSIS — G6289 Other specified polyneuropathies: Secondary | ICD-10-CM

## 2016-10-23 DIAGNOSIS — I10 Essential (primary) hypertension: Secondary | ICD-10-CM

## 2016-10-23 DIAGNOSIS — E1140 Type 2 diabetes mellitus with diabetic neuropathy, unspecified: Secondary | ICD-10-CM | POA: Diagnosis not present

## 2016-10-23 DIAGNOSIS — G629 Polyneuropathy, unspecified: Secondary | ICD-10-CM | POA: Insufficient documentation

## 2016-10-23 LAB — LIPID PANEL PICCOLO, WAIVED
CHOLESTEROL PICCOLO, WAIVED: 123 mg/dL (ref <200)
Chol/HDL Ratio Piccolo,Waive: 3.2 mg/dL
HDL CHOL PICCOLO, WAIVED: 38 mg/dL — AB (ref >59)
LDL Chol Calc Piccolo Waived: 50 mg/dL (ref <100)
Triglycerides Piccolo,Waived: 172 mg/dL — ABNORMAL HIGH (ref <150)
VLDL Chol Calc Piccolo,Waive: 34 mg/dL — ABNORMAL HIGH (ref <30)

## 2016-10-23 LAB — BAYER DCA HB A1C WAIVED: HB A1C (BAYER DCA - WAIVED): 5.8 % (ref <7.0)

## 2016-10-23 NOTE — Progress Notes (Signed)
BP 116/73   Pulse (!) 116   Temp 98.9 F (37.2 C)   Ht 5' 4.7" (1.643 m)   Wt 212 lb 12.8 oz (96.5 kg)   LMP  (LMP Unknown)   SpO2 95%   BMI 35.74 kg/m    Subjective:    [name], female    DOB: 08/15/1945, 71 y.o.   MRN: [id number]  HPI: Catherine Hinton is a 71 y.o. female  Chief Complaint  Patient presents with  . Diabetes  . Hyperlipidemia  . Hypertension   Diabetes: Using medications without difficulties No hypoglycemic episodes No hyperglycemic episodes Feet problems: none Blood Sugars averaging: 95-124 eye exam within last year Last Hgb A1C: 6.0  Hypertension  Using medications without difficulty Average home BPs "always the same"   Using medication without problems or lightheadedness No chest pain with exertion or shortness of breath No Edema  No Muscle aches  Diet: Exercise: Keeps up with a 68 year old and has a garden  Relevant past medical, surgical, family and social history reviewed and updated as indicated. Interim medical history since our last visit reviewed. Allergies and medications reviewed and updated.  Review of Systems  Per HPI unless specifically indicated above     Objective:    BP 116/73   Pulse (!) 116   Temp 98.9 F (37.2 C)   Ht 5' 4.7" (1.643 m)   Wt 212 lb 12.8 oz (96.5 kg)   LMP  (LMP Unknown)   SpO2 95%   BMI 35.74 kg/m   Wt Readings from Last 3 Encounters:  10/23/16 212 lb 12.8 oz (96.5 kg)  04/25/16 209 lb 6.4 oz (95 kg)  10/26/15 206 lb 9.6 oz (93.7 kg)    Physical Exam  Constitutional: She is oriented to person, place, and time. She appears well-developed and well-nourished.  HENT:  Head: Normocephalic and atraumatic.  Eyes: Pupils are equal, round, and reactive to light. Right eye exhibits no discharge. Left eye exhibits no discharge. No scleral icterus.  Neck: Normal range of motion. Neck supple. Carotid bruit is not present. No thyromegaly present.  Cardiovascular: Normal rate,  regular rhythm and normal heart sounds.  Exam reveals no gallop and no friction rub.   No murmur heard. Pulmonary/Chest: Effort normal and breath sounds normal. No respiratory distress. She has no wheezes. She has no rales.  Abdominal: Soft. Bowel sounds are normal. There is no tenderness. There is no rebound.  Genitourinary: No breast swelling, tenderness or discharge.  Musculoskeletal: Normal range of motion.  Lymphadenopathy:    She has no cervical adenopathy.  Neurological: She is alert and oriented to person, place, and time.  Skin: Skin is warm, dry and intact. No rash noted.  Psychiatric: She has a normal mood and affect. Her speech is normal and behavior is normal. Judgment and thought content normal. Cognition and memory are normal.    Results for orders placed or performed in visit on 08/14/16  HM DIABETES EYE EXAM  Result Value Ref Range   HM Diabetic Eye Exam No Retinopathy No Retinopathy      Assessment & Plan:   Problem List Items Addressed This Visit      Unprioritized   Hypertension    Stable, continue present medications.        Relevant Orders   Comprehensive metabolic panel   Lipid Panel Piccolo, Waived   IFG (impaired fasting glucose) - Primary    Hgb A1Cis 5.8.  Continue with Metformin due to  Neuropathy      Relevant Orders   Bayer DCA Hb A1c Waived   Comprehensive metabolic panel   Lipid Panel Piccolo, Waived   Peripheral neuropathy    Bilateral feet.            Follow up plan: Return in about 6 months (around 04/25/2017).

## 2016-10-23 NOTE — Assessment & Plan Note (Signed)
Bilateral feet

## 2016-10-23 NOTE — Assessment & Plan Note (Signed)
Stable, continue present medications.   

## 2016-10-23 NOTE — Assessment & Plan Note (Signed)
Hgb A1Cis 5.8.  Continue with Metformin due to Neuropathy

## 2016-10-24 ENCOUNTER — Encounter: Payer: Self-pay | Admitting: Unknown Physician Specialty

## 2016-10-24 LAB — COMPREHENSIVE METABOLIC PANEL
A/G RATIO: 2 (ref 1.2–2.2)
ALBUMIN: 4.6 g/dL (ref 3.6–4.8)
ALK PHOS: 48 IU/L (ref 39–117)
ALT: 20 IU/L (ref 0–32)
AST: 23 IU/L (ref 0–40)
BUN / CREAT RATIO: 21 (ref 12–28)
BUN: 11 mg/dL (ref 8–27)
Bilirubin Total: 0.4 mg/dL (ref 0.0–1.2)
CO2: 25 mmol/L (ref 18–29)
Calcium: 10 mg/dL (ref 8.7–10.3)
Chloride: 98 mmol/L (ref 96–106)
Creatinine, Ser: 0.53 mg/dL — ABNORMAL LOW (ref 0.57–1.00)
GFR calc Af Amer: 113 mL/min/{1.73_m2} (ref >59)
GFR, EST NON AFRICAN AMERICAN: 98 mL/min/{1.73_m2} (ref >59)
GLOBULIN, TOTAL: 2.3 g/dL (ref 1.5–4.5)
Glucose: 124 mg/dL — ABNORMAL HIGH (ref 65–99)
POTASSIUM: 4.2 mmol/L (ref 3.5–5.2)
SODIUM: 140 mmol/L (ref 134–144)
Total Protein: 6.9 g/dL (ref 6.0–8.5)

## 2016-12-14 ENCOUNTER — Encounter: Payer: Self-pay | Admitting: Family Medicine

## 2017-01-18 ENCOUNTER — Other Ambulatory Visit: Payer: Self-pay | Admitting: Unknown Physician Specialty

## 2017-01-18 ENCOUNTER — Other Ambulatory Visit: Payer: Self-pay | Admitting: Family Medicine

## 2017-01-18 NOTE — Telephone Encounter (Signed)
Last OV: 10/23/16 Next OV: 04/26/17  Lab Results  Component Value Date   CHOL 123 10/23/2016   HDL 40 04/25/2016   LDLCALC 76 04/25/2016   TRIG 172 (H) 10/23/2016   Lab Results  Component Value Date   CREATININE 0.53 (L) 10/23/2016   BUN 11 10/23/2016   NA 140 10/23/2016   K 4.2 10/23/2016   CL 98 10/23/2016   CO2 25 10/23/2016

## 2017-02-04 DIAGNOSIS — E119 Type 2 diabetes mellitus without complications: Secondary | ICD-10-CM | POA: Diagnosis not present

## 2017-02-04 DIAGNOSIS — H04129 Dry eye syndrome of unspecified lacrimal gland: Secondary | ICD-10-CM | POA: Diagnosis not present

## 2017-02-04 DIAGNOSIS — H353131 Nonexudative age-related macular degeneration, bilateral, early dry stage: Secondary | ICD-10-CM | POA: Diagnosis not present

## 2017-02-04 DIAGNOSIS — H018 Other specified inflammations of eyelid: Secondary | ICD-10-CM | POA: Diagnosis not present

## 2017-02-04 DIAGNOSIS — H40019 Open angle with borderline findings, low risk, unspecified eye: Secondary | ICD-10-CM | POA: Diagnosis not present

## 2017-02-04 DIAGNOSIS — H401130 Primary open-angle glaucoma, bilateral, stage unspecified: Secondary | ICD-10-CM | POA: Diagnosis not present

## 2017-02-21 ENCOUNTER — Telehealth: Payer: Self-pay | Admitting: Unknown Physician Specialty

## 2017-02-21 ENCOUNTER — Other Ambulatory Visit: Payer: Self-pay

## 2017-02-21 DIAGNOSIS — N6310 Unspecified lump in the right breast, unspecified quadrant: Secondary | ICD-10-CM

## 2017-02-21 NOTE — Telephone Encounter (Signed)
Right breast ultrasound order entered for 6 month f/up on breast mass (see report under imaging). Will call patient and let her know.

## 2017-02-21 NOTE — Telephone Encounter (Signed)
Called and let patient know that the order for her ultrasound has been entered.

## 2017-03-08 ENCOUNTER — Other Ambulatory Visit: Payer: Self-pay | Admitting: Unknown Physician Specialty

## 2017-03-13 ENCOUNTER — Other Ambulatory Visit: Payer: Self-pay | Admitting: Family Medicine

## 2017-04-04 ENCOUNTER — Ambulatory Visit (INDEPENDENT_AMBULATORY_CARE_PROVIDER_SITE_OTHER): Payer: Medicare Other

## 2017-04-04 VITALS — BP 135/83 | HR 105 | Temp 98.5°F | Resp 16 | Ht 65.0 in | Wt 219.9 lb

## 2017-04-04 DIAGNOSIS — Z23 Encounter for immunization: Secondary | ICD-10-CM | POA: Diagnosis not present

## 2017-04-04 DIAGNOSIS — Z0000 Encounter for general adult medical examination without abnormal findings: Secondary | ICD-10-CM | POA: Diagnosis not present

## 2017-04-04 NOTE — Patient Instructions (Addendum)
Catherine Hinton , Thank you for taking time to come for your Medicare Wellness Visit. I appreciate your ongoing commitment to your health goals. Please review the following plan we discussed and let me know if I can assist you in the future.   Screening recommendations/referrals: Colonoscopy: completed 12/13/2010 Mammogram: completed 09/28/2016 Bone Density: due now- declined Recommended yearly ophthalmology/optometry visit for glaucoma screening and checkup Recommended yearly dental visit for hygiene and checkup  Vaccinations: Influenza vaccine: done today Pneumococcal vaccine: up to date Tdap vaccine: up to date Shingles vaccine: up to date  Advanced directives: Please bring a copy of your health care power of attorney and living will to the office at your convenience.  Conditions/risks identified: Exercise 150 minutes per week (moderate activity)  Next appointment: Follow up on 04/26/2017 at 11:30am with Regino Schultze. Follow up in one year for your annual wellness exam.    Preventive Care 65 Years and Older, Female Preventive care refers to lifestyle choices and visits with your health care provider that can promote health and wellness. What does preventive care include?  A yearly physical exam. This is also called an annual well check.  Dental exams once or twice a year.  Routine eye exams. Ask your health care provider how often you should have your eyes checked.  Personal lifestyle choices, including:  Daily care of your teeth and gums.  Regular physical activity.  Eating a healthy diet.  Avoiding tobacco and drug use.  Limiting alcohol use.  Practicing safe sex.  Taking low-dose aspirin every day.  Taking vitamin and mineral supplements as recommended by your health care provider. What happens during an annual well check? The services and screenings done by your health care provider during your annual well check will depend on your age, overall health, lifestyle  risk factors, and family history of disease. Counseling  Your health care provider may ask you questions about your:  Alcohol use.  Tobacco use.  Drug use.  Emotional well-being.  Home and relationship well-being.  Sexual activity.  Eating habits.  History of falls.  Memory and ability to understand (cognition).  Work and work Statistician.  Reproductive health. Screening  You may have the following tests or measurements:  Height, weight, and BMI.  Blood pressure.  Lipid and cholesterol levels. These may be checked every 5 years, or more frequently if you are over 37 years old.  Skin check.  Lung cancer screening. You may have this screening every year starting at age 30 if you have a 30-pack-year history of smoking and currently smoke or have quit within the past 15 years.  Fecal occult blood test (FOBT) of the stool. You may have this test every year starting at age 71.  Flexible sigmoidoscopy or colonoscopy. You may have a sigmoidoscopy every 5 years or a colonoscopy every 10 years starting at age 40.  Hepatitis C blood test.  Hepatitis B blood test.  Sexually transmitted disease (STD) testing.  Diabetes screening. This is done by checking your blood sugar (glucose) after you have not eaten for a while (fasting). You may have this done every 1-3 years.  Bone density scan. This is done to screen for osteoporosis. You may have this done starting at age 56.  Mammogram. This may be done every 1-2 years. Talk to your health care provider about how often you should have regular mammograms. Talk with your health care provider about your test results, treatment options, and if necessary, the need for more tests. Vaccines  Your health care provider may recommend certain vaccines, such as:  Influenza vaccine. This is recommended every year.  Tetanus, diphtheria, and acellular pertussis (Tdap, Td) vaccine. You may need a Td booster every 10 years.  Zoster vaccine.  You may need this after age 24.  Pneumococcal 13-valent conjugate (PCV13) vaccine. One dose is recommended after age 26.  Pneumococcal polysaccharide (PPSV23) vaccine. One dose is recommended after age 69. Talk to your health care provider about which screenings and vaccines you need and how often you need them. This information is not intended to replace advice given to you by your health care provider. Make sure you discuss any questions you have with your health care provider. Document Released: 07/01/2015 Document Revised: 02/22/2016 Document Reviewed: 04/05/2015 Elsevier Interactive Patient Education  2017 [place] Prevention in the Home Falls can cause injuries. They can happen to people of all ages. There are many things you can do to make your home safe and to help prevent falls. What can I do on the outside of my home?  Regularly fix the edges of walkways and driveways and fix any cracks.  Remove anything that might make you trip as you walk through a door, such as a raised step or threshold.  Trim any bushes or trees on the path to your home.  Use bright outdoor lighting.  Clear any walking paths of anything that might make someone trip, such as rocks or tools.  Regularly check to see if handrails are loose or broken. Make sure that both sides of any steps have handrails.  Any raised decks and porches should have guardrails on the edges.  Have any leaves, snow, or ice cleared regularly.  Use sand or salt on walking paths during winter.  Clean up any spills in your garage right away. This includes oil or grease spills. What can I do in the bathroom?  Use night lights.  Install grab bars by the toilet and in the tub and shower. Do not use towel bars as grab bars.  Use non-skid mats or decals in the tub or shower.  If you need to sit down in the shower, use a plastic, non-slip stool.  Keep the floor dry. Clean up any water that spills on the floor as soon  as it happens.  Remove soap buildup in the tub or shower regularly.  Attach bath mats securely with double-sided non-slip rug tape.  Do not have throw rugs and other things on the floor that can make you trip. What can I do in the bedroom?  Use night lights.  Make sure that you have a light by your bed that is easy to reach.  Do not use any sheets or blankets that are too big for your bed. They should not hang down onto the floor.  Have a firm chair that has side arms. You can use this for support while you get dressed.  Do not have throw rugs and other things on the floor that can make you trip. What can I do in the kitchen?  Clean up any spills right away.  Avoid walking on wet floors.  Keep items that you use a lot in easy-to-reach places.  If you need to reach something above you, use a strong step stool that has a grab bar.  Keep electrical cords out of the way.  Do not use floor polish or wax that makes floors slippery. If you must use wax, use non-skid floor wax.  Do  not have throw rugs and other things on the floor that can make you trip. What can I do with my stairs?  Do not leave any items on the stairs.  Make sure that there are handrails on both sides of the stairs and use them. Fix handrails that are broken or loose. Make sure that handrails are as long as the stairways.  Check any carpeting to make sure that it is firmly attached to the stairs. Fix any carpet that is loose or worn.  Avoid having throw rugs at the top or bottom of the stairs. If you do have throw rugs, attach them to the floor with carpet tape.  Make sure that you have a light switch at the top of the stairs and the bottom of the stairs. If you do not have them, ask someone to add them for you. What else can I do to help prevent falls?  Wear shoes that:  Do not have high heels.  Have rubber bottoms.  Are comfortable and fit you well.  Are closed at the toe. Do not wear sandals.  If  you use a stepladder:  Make sure that it is fully opened. Do not climb a closed stepladder.  Make sure that both sides of the stepladder are locked into place.  Ask someone to hold it for you, if possible.  Clearly mark and make sure that you can see:  Any grab bars or handrails.  First and last steps.  Where the edge of each step is.  Use tools that help you move around (mobility aids) if they are needed. These include:  Canes.  Walkers.  Scooters.  Crutches.  Turn on the lights when you go into a dark area. Replace any light bulbs as soon as they burn out.  Set up your furniture so you have a clear path. Avoid moving your furniture around.  If any of your floors are uneven, fix them.  If there are any pets around you, be aware of where they are.  Review your medicines with your doctor. Some medicines can make you feel dizzy. This can increase your chance of falling. Ask your doctor what other things that you can do to help prevent falls. This information is not intended to replace advice given to you by your health care provider. Make sure you discuss any questions you have with your health care provider. Document Released: 03/31/2009 Document Revised: 11/10/2015 Document Reviewed: 07/09/2014 Elsevier Interactive Patient Education  2017 [employer].

## 2017-04-04 NOTE — Progress Notes (Signed)
Subjective:   Catherine Hinton is a 71 y.o. female who presents for Medicare Annual (Subsequent) preventive examination.  Review of Systems:  Cardiac Risk Factors include: advanced age (>35men, >42 women);obesity (BMI >30kg/m2);diabetes mellitus;dyslipidemia;hypertension     Objective:     Vitals: BP 135/83 (BP Location: Left Arm, Patient Position: Sitting)   Pulse (!) 105   Temp 98.5 F (36.9 C) (Oral)   Resp 16   Ht 5\' 5"  (1.651 m)   Wt 219 lb 14.4 oz (99.7 kg)   LMP  (LMP Unknown)   BMI 36.59 kg/m   Body mass index is 36.59 kg/m.   Tobacco History  Smoking Status  . Never Smoker  Smokeless Tobacco  . Never Used     Counseling given: Not Answered   Past Medical History:  Diagnosis Date  . Diabetes mellitus with renal manifestation ([place])   . Diabetes mellitus without complication ([place])   . Gout   . Hyperlipidemia   . Hypertension   . Hypertensive CKD (chronic kidney disease)   . Obesity   . Sleep apnea    CPAP   Past Surgical History:  Procedure Laterality Date  . ABDOMINAL HYSTERECTOMY    . CHOLECYSTECTOMY    . HERNIA REPAIR    . THUMB FUSION     Family History  Problem Relation Age of Onset  . Diabetes Mother   . Cancer Mother        Pituitary tumor  . Hyperlipidemia Mother   . Hypertension Mother   . Thyroid disease Mother   . Stroke Mother   . Cancer Father        Lung  . Cancer Brother        Oral  . Hypertension Son   . Cancer Maternal Grandmother        colon  . Blindness Maternal Grandfather   . Arthritis Paternal Grandmother   . Arthritis Daughter        RA  . Breast cancer Neg Hx    History  Sexual Activity  . Sexual activity: No    Outpatient Encounter Prescriptions as of 04/04/2017  Medication Sig  . allopurinol (ZYLOPRIM) 300 MG tablet TAKE 1 TABLET EVERY DAY  . aspirin 81 MG tablet Take 81 mg by mouth daily.  . Cholecalciferol (VITAMIN D-1000 MAX ST) 1000 UNITS tablet Take 1,000 Units by mouth daily.  .  cyclobenzaprine (FLEXERIL) 10 MG tablet TAKE 1 TABLET AT BEDTIME  . DHA-EPA-Flaxseed Oil-Vitamin E (THERA TEARS NUTRITION PO) Take 1,200 mg by mouth. 3 soft gels daily  . gemfibrozil (LOPID) 600 MG tablet TAKE 1 TABLET TWICE DAILY  . metFORMIN (GLUCOPHAGE) 500 MG tablet TAKE 1 TABLET TWICE DAILY  . metoprolol succinate (TOPROL-XL) 25 MG 24 hr tablet TAKE 1 TABLET EVERY DAY  . valsartan-hydrochlorothiazide (DIOVAN-HCT) 160-12.5 MG tablet TAKE 1 TABLET EVERY DAY  . XIIDRA 5 % SOLN   . Multiple Vitamins-Minerals (EYE VITAMINS PO) Take 1 tablet by mouth daily.   No facility-administered encounter medications on file as of 04/04/2017.     Activities of Daily Living In your present state of health, do you have any difficulty performing the following activities: 04/04/2017 04/25/2016  Hearing? N N  Vision? N N  Difficulty concentrating or making decisions? N N  Walking or climbing stairs? N N  Dressing or bathing? N N  Doing errands, shopping? N N  Preparing Food and eating ? N -  Using the Toilet? N -  In the past six months,  have you accidently leaked urine? N -  Do you have problems with loss of bowel control? N -  Managing your Medications? N -  Managing your Finances? N -  Housekeeping or managing your Housekeeping? N -  Some recent data might be hidden    Patient Care Team: Kathrine Haddock, NP as PCP - General (Nurse Practitioner) Anell Barr, OD (Optometry)    Assessment:     Exercise Activities and Dietary recommendations Current Exercise Habits: The patient does not participate in regular exercise at present, Exercise limited by: None identified  Goals    . Exercise 150 minutes per week (moderate activity)      Fall Risk Fall Risk  04/04/2017 04/25/2016 04/25/2015 12/09/2014 12/01/2014  Falls in the past year? No No No No No   Depression Screen PHQ 2/9 Scores 04/04/2017 04/25/2016 04/25/2015 12/01/2014  PHQ - 2 Score 0 0 0 0  PHQ- 9 Score - 2 - -     Cognitive  Function     6CIT Screen 04/04/2017  What Year? 0 points  What month? 0 points  What time? 0 points  Count back from 20 0 points  Months in reverse 0 points  Repeat phrase 0 points  Total Score 0    Immunization History  Administered Date(s) Administered  . Influenza, High Dose Seasonal PF 04/25/2016, 04/04/2017  . Influenza,inj,Quad PF,6+ Mos 04/25/2015  . Influenza-Unspecified 04/18/2012  . Pneumococcal Conjugate-13 04/20/2014  . Pneumococcal Polysaccharide-23 10/26/2015  . Pneumococcal-Unspecified 06/18/2006  . Tdap 12/12/2010  . Zoster 09/21/2013   Screening Tests Health Maintenance  Topic Date Due  . MAMMOGRAM  03/30/2017  . DEXA SCAN  04/04/2018 (Originally 02/03/2013)  . HEMOGLOBIN A1C  04/25/2017  . OPHTHALMOLOGY EXAM  08/08/2017  . FOOT EXAM  10/23/2017  . COLONOSCOPY  08/10/2019  . TETANUS/TDAP  12/11/2020  . INFLUENZA VACCINE  Completed  . Hepatitis C Screening  Completed  . PNA vac Low Risk Adult  Completed      Plan:     I have personally reviewed and addressed the Medicare Annual Wellness questionnaire and have noted the following in the patient's chart:  A. Medical and social history B. Use of alcohol, tobacco or illicit drugs  C. Current medications and supplements D. Functional ability and status E.  Nutritional status F.  Physical activity G. Advance directives H. List of other physicians I.  Hospitalizations, surgeries, and ER visits in previous 12 months J.  [place] such as hearing and vision if needed, cognitive and depression L. Referrals and appointments   In addition, I have reviewed and discussed with patient certain preventive protocols, quality metrics, and best practice recommendations. A written personalized care plan for preventive services as well as general preventive health recommendations were provided to patient.   Signed,  Tyler Aas, LPN Nurse Health Advisor   MD Recommendations: none

## 2017-04-16 ENCOUNTER — Ambulatory Visit
Admission: RE | Admit: 2017-04-16 | Discharge: 2017-04-16 | Disposition: A | Discharge status: Home / Self Care | Payer: Medicare Other | Source: Ambulatory Visit | Attending: Unknown Physician Specialty | Admitting: Unknown Physician Specialty

## 2017-04-16 DIAGNOSIS — N6311 Unspecified lump in the right breast, upper outer quadrant: Secondary | ICD-10-CM | POA: Diagnosis not present

## 2017-04-16 DIAGNOSIS — R928 Other abnormal and inconclusive findings on diagnostic imaging of breast: Secondary | ICD-10-CM | POA: Insufficient documentation

## 2017-04-16 DIAGNOSIS — N6310 Unspecified lump in the right breast, unspecified quadrant: Secondary | ICD-10-CM

## 2017-04-16 LAB — HM MAMMOGRAPHY

## 2017-04-26 ENCOUNTER — Ambulatory Visit (INDEPENDENT_AMBULATORY_CARE_PROVIDER_SITE_OTHER): Payer: Medicare Other | Admitting: Unknown Physician Specialty

## 2017-04-26 ENCOUNTER — Encounter: Payer: Self-pay | Admitting: Unknown Physician Specialty

## 2017-04-26 VITALS — BP 129/79 | HR 108 | Temp 99.0°F | Ht 65.0 in | Wt 216.3 lb

## 2017-04-26 DIAGNOSIS — I10 Essential (primary) hypertension: Secondary | ICD-10-CM | POA: Diagnosis not present

## 2017-04-26 DIAGNOSIS — G4733 Obstructive sleep apnea (adult) (pediatric): Secondary | ICD-10-CM

## 2017-04-26 DIAGNOSIS — E7800 Pure hypercholesterolemia, unspecified: Secondary | ICD-10-CM

## 2017-04-26 DIAGNOSIS — R7301 Impaired fasting glucose: Secondary | ICD-10-CM | POA: Diagnosis not present

## 2017-04-26 LAB — BAYER DCA HB A1C WAIVED: HB A1C (BAYER DCA - WAIVED): 6.2 % (ref <7.0)

## 2017-04-26 NOTE — Assessment & Plan Note (Signed)
Work on diet and exercise 

## 2017-04-26 NOTE — Assessment & Plan Note (Addendum)
Nightly use and feels she is doing well

## 2017-04-26 NOTE — Assessment & Plan Note (Signed)
Stable, continue present medications.   

## 2017-04-26 NOTE — Assessment & Plan Note (Signed)
Hgb A1C is 6.2.  Continue present treatment.  Will work on diet and exercise

## 2017-04-26 NOTE — Progress Notes (Signed)
BP 129/79   Pulse (!) 108   Temp 99 F (37.2 C) (Oral)   Ht 5\' 5"  (1.651 m)   Wt 216 lb 4.8 oz (98.1 kg)   LMP  (LMP Unknown)   SpO2 96%   BMI 35.99 kg/m    Subjective:    [name], female    DOB: April 01, 1946, 71 y.o.   MRN: [id number]  HPI: Catherine Hinton is a 71 y.o. female  Chief Complaint  Patient presents with  . Annual Exam    pt has wellness exam 04/04/17 with [facility]   . Diabetes  . Hypertension   Diabetes: Using medications without difficulties No hypoglycemic episodes No hyperglycemic episodes Feet problems:none Blood Sugars averaging:120-130 eye exam within last year Last Hgb A1C: 5.8  Hypertension  Using medications without difficulty Average home BPs 120/78   Using medication without problems or lightheadedness No chest pain with exertion or shortness of breath No Edema  Elevated Cholesterol Using medications without problems No Muscle aches  Diet: Exercise: Working on it  Relevant past medical, surgical, family and social history reviewed and updated as indicated. Interim medical history since our last visit reviewed. Allergies and medications reviewed and updated.  Review of Systems  Constitutional: Negative.   HENT: Negative.   Eyes: Negative.   Respiratory: Negative.   Cardiovascular: Negative.   Gastrointestinal: Negative.   Endocrine: Negative.   Genitourinary: Negative.   Musculoskeletal: Negative.   Skin: Negative.   Allergic/Immunologic: Negative.   Neurological: Negative.   Hematological: Negative.   Psychiatric/Behavioral: Negative.     Per HPI unless specifically indicated above     Objective:    BP 129/79   Pulse (!) 108   Temp 99 F (37.2 C) (Oral)   Ht 5\' 5"  (1.651 m)   Wt 216 lb 4.8 oz (98.1 kg)   LMP  (LMP Unknown)   SpO2 96%   BMI 35.99 kg/m   Wt Readings from Last 3 Encounters:  04/26/17 216 lb 4.8 oz (98.1 kg)  04/04/17 219 lb 14.4 oz (99.7 kg)  10/23/16 212 lb 12.8 oz (96.5 kg)    Physical Exam  Constitutional: She is oriented to person, place, and time. She appears well-developed and well-nourished.  HENT:  Head: Normocephalic and atraumatic.  Eyes: Pupils are equal, round, and reactive to light. Right eye exhibits no discharge. Left eye exhibits no discharge. No scleral icterus.  Neck: Normal range of motion. Neck supple. Carotid bruit is not present. No thyromegaly present.  Cardiovascular: Normal rate, regular rhythm and normal heart sounds. Exam reveals no gallop and no friction rub.  No murmur heard. Pulmonary/Chest: Effort normal and breath sounds normal. No respiratory distress. She has no wheezes. She has no rales.  Abdominal: Soft. Bowel sounds are normal. There is no tenderness. There is no rebound.  Genitourinary: No breast swelling, tenderness or discharge.  Musculoskeletal: Normal range of motion.  Lymphadenopathy:    She has no cervical adenopathy.  Neurological: She is alert and oriented to person, place, and time.  Skin: Skin is warm, dry and intact. No rash noted.  Psychiatric: She has a normal mood and affect. Her speech is normal and behavior is normal. Judgment and thought content normal. Cognition and memory are normal.    Results for orders placed or performed in visit on 10/23/16  Bayer DCA Hb A1c Waived  Result Value Ref Range   Bayer DCA Hb A1c Waived 5.8 <7.0 %  Comprehensive metabolic panel  Result Value Ref  Range   Glucose 124 (H) 65 - 99 mg/dL   BUN 11 8 - 27 mg/dL   Creatinine, Ser 0.53 (L) 0.57 - 1.00 mg/dL   GFR calc non Af Amer 98 >59 mL/min/1.73   GFR calc Af Amer 113 >59 mL/min/1.73   BUN/Creatinine Ratio 21 12 - 28   Sodium 140 134 - 144 mmol/L   Potassium 4.2 3.5 - 5.2 mmol/L   Chloride 98 96 - 106 mmol/L   CO2 25 18 - 29 mmol/L   Calcium 10.0 8.7 - 10.3 mg/dL   Total Protein 6.9 6.0 - 8.5 g/dL   Albumin 4.6 3.6 - 4.8 g/dL   Globulin, Total 2.3 1.5 - 4.5 g/dL   Albumin/Globulin Ratio 2.0 1.2 - 2.2   Bilirubin  Total 0.4 0.0 - 1.2 mg/dL   Alkaline Phosphatase 48 39 - 117 IU/L   AST 23 0 - 40 IU/L   ALT 20 0 - 32 IU/L  Lipid Panel Piccolo, Waived  Result Value Ref Range   Cholesterol Piccolo, Waived 123 <200 mg/dL   HDL Chol Piccolo, Waived 38 (L) >59 mg/dL   Triglycerides Piccolo,Waived 172 (H) <150 mg/dL   Chol/HDL Ratio Piccolo,Waive 3.2 mg/dL   LDL Chol Calc Piccolo Waived 50 <100 mg/dL   VLDL Chol Calc Piccolo,Waive 34 (H) <30 mg/dL      Assessment & Plan:   Problem List Items Addressed This Visit      Unprioritized   Hyperlipemia    Stable, continue present medications.        Hypertension - Primary    Stable, continue present medications.        Relevant Orders   Comprehensive metabolic panel   IFG (impaired fasting glucose)    Hgb A1C is 6.2.  Continue present treatment.  Will work on diet and exercise      Relevant Orders   Bayer DCA Hb A1c Waived   Obstructive sleep apnea    Nightly use.          Follow up plan: Return in about 6 months (around 10/24/2017).

## 2017-04-27 LAB — COMPREHENSIVE METABOLIC PANEL
A/G RATIO: 2 (ref 1.2–2.2)
ALBUMIN: 4.6 g/dL (ref 3.6–4.8)
ALK PHOS: 45 IU/L (ref 39–117)
ALT: 17 IU/L (ref 0–32)
AST: 18 IU/L (ref 0–40)
BUN / CREAT RATIO: 17 (ref 12–28)
BUN: 9 mg/dL (ref 8–27)
Bilirubin Total: 0.3 mg/dL (ref 0.0–1.2)
CALCIUM: 9.9 mg/dL (ref 8.7–10.3)
CO2: 26 mmol/L (ref 20–29)
Chloride: 99 mmol/L (ref 96–106)
Creatinine, Ser: 0.53 mg/dL — ABNORMAL LOW (ref 0.57–1.00)
GFR calc Af Amer: 112 mL/min/{1.73_m2} (ref >59)
GFR, EST NON AFRICAN AMERICAN: 97 mL/min/{1.73_m2} (ref >59)
GLOBULIN, TOTAL: 2.3 g/dL (ref 1.5–4.5)
GLUCOSE: 130 mg/dL — AB (ref 65–99)
POTASSIUM: 4.2 mmol/L (ref 3.5–5.2)
SODIUM: 142 mmol/L (ref 134–144)
Total Protein: 6.9 g/dL (ref 6.0–8.5)

## 2017-04-29 ENCOUNTER — Encounter: Payer: Self-pay | Admitting: Unknown Physician Specialty

## 2017-05-21 ENCOUNTER — Other Ambulatory Visit: Payer: Self-pay | Admitting: Unknown Physician Specialty

## 2017-07-29 ENCOUNTER — Other Ambulatory Visit: Payer: Self-pay | Admitting: Unknown Physician Specialty

## 2017-07-31 ENCOUNTER — Encounter: Payer: Self-pay | Admitting: Unknown Physician Specialty

## 2017-07-31 DIAGNOSIS — H04129 Dry eye syndrome of unspecified lacrimal gland: Secondary | ICD-10-CM | POA: Diagnosis not present

## 2017-07-31 DIAGNOSIS — H02883 Meibomian gland dysfunction of right eye, unspecified eyelid: Secondary | ICD-10-CM | POA: Diagnosis not present

## 2017-07-31 DIAGNOSIS — H40019 Open angle with borderline findings, low risk, unspecified eye: Secondary | ICD-10-CM | POA: Diagnosis not present

## 2017-07-31 DIAGNOSIS — H26492 Other secondary cataract, left eye: Secondary | ICD-10-CM | POA: Diagnosis not present

## 2017-07-31 DIAGNOSIS — E119 Type 2 diabetes mellitus without complications: Secondary | ICD-10-CM | POA: Diagnosis not present

## 2017-07-31 DIAGNOSIS — H353130 Nonexudative age-related macular degeneration, bilateral, stage unspecified: Secondary | ICD-10-CM | POA: Diagnosis not present

## 2017-07-31 DIAGNOSIS — H401130 Primary open-angle glaucoma, bilateral, stage unspecified: Secondary | ICD-10-CM | POA: Diagnosis not present

## 2017-08-12 LAB — HM DIABETES EYE EXAM

## 2017-09-16 ENCOUNTER — Telehealth: Payer: Self-pay | Admitting: Unknown Physician Specialty

## 2017-09-16 DIAGNOSIS — R928 Other abnormal and inconclusive findings on diagnostic imaging of breast: Secondary | ICD-10-CM

## 2017-09-16 NOTE — Telephone Encounter (Signed)
Called and let patient know that orders have been entered for her.

## 2017-09-16 NOTE — Telephone Encounter (Signed)
Reason for CRM: pt needs order for diagnostic mammogram - [place]  Cb is [phone]

## 2017-09-16 NOTE — Telephone Encounter (Signed)
Routing to provider for orders. According to mammogram in chart, patient needs orders for bilateral diagnostic mammogram and right breast ultrasound.

## 2017-09-16 NOTE — Telephone Encounter (Signed)
Copied from [place] [id number]. Topic: Referral - Request >> Sep 16, 2017  3:34 PM Arletha Grippe wrote: Reason for CRM: pt needs order for diagnostic mammogram - [place]  Cb is [phone]

## 2017-09-17 ENCOUNTER — Other Ambulatory Visit: Payer: Self-pay | Admitting: Unknown Physician Specialty

## 2017-09-17 DIAGNOSIS — R928 Other abnormal and inconclusive findings on diagnostic imaging of breast: Secondary | ICD-10-CM

## 2017-09-18 NOTE — Telephone Encounter (Signed)
LOV  04/26/17 [employer]

## 2017-10-08 ENCOUNTER — Ambulatory Visit
Admission: RE | Admit: 2017-10-08 | Discharge: 2017-10-08 | Disposition: A | Discharge status: Home / Self Care | Payer: Medicare Other | Source: Ambulatory Visit | Attending: Unknown Physician Specialty | Admitting: Unknown Physician Specialty

## 2017-10-08 DIAGNOSIS — R928 Other abnormal and inconclusive findings on diagnostic imaging of breast: Secondary | ICD-10-CM

## 2017-10-08 DIAGNOSIS — N6311 Unspecified lump in the right breast, upper outer quadrant: Secondary | ICD-10-CM | POA: Diagnosis not present

## 2017-10-08 DIAGNOSIS — N6310 Unspecified lump in the right breast, unspecified quadrant: Secondary | ICD-10-CM | POA: Diagnosis not present

## 2017-10-25 ENCOUNTER — Ambulatory Visit: Payer: Medicare Other | Admitting: Unknown Physician Specialty

## 2017-11-01 ENCOUNTER — Ambulatory Visit: Payer: Medicare Other | Admitting: Unknown Physician Specialty

## 2017-11-06 ENCOUNTER — Ambulatory Visit (INDEPENDENT_AMBULATORY_CARE_PROVIDER_SITE_OTHER): Payer: Medicare Other | Admitting: Unknown Physician Specialty

## 2017-11-06 ENCOUNTER — Encounter: Payer: Self-pay | Admitting: Unknown Physician Specialty

## 2017-11-06 VITALS — BP 106/76 | HR 111 | Temp 97.8°F | Ht 65.0 in | Wt 220.4 lb

## 2017-11-06 DIAGNOSIS — I10 Essential (primary) hypertension: Secondary | ICD-10-CM

## 2017-11-06 DIAGNOSIS — E1141 Type 2 diabetes mellitus with diabetic mononeuropathy: Secondary | ICD-10-CM | POA: Insufficient documentation

## 2017-11-06 DIAGNOSIS — E1140 Type 2 diabetes mellitus with diabetic neuropathy, unspecified: Secondary | ICD-10-CM | POA: Diagnosis not present

## 2017-11-06 DIAGNOSIS — E785 Hyperlipidemia, unspecified: Secondary | ICD-10-CM | POA: Diagnosis not present

## 2017-11-06 DIAGNOSIS — E1142 Type 2 diabetes mellitus with diabetic polyneuropathy: Secondary | ICD-10-CM | POA: Insufficient documentation

## 2017-11-06 DIAGNOSIS — R7301 Impaired fasting glucose: Secondary | ICD-10-CM | POA: Diagnosis not present

## 2017-11-06 LAB — HEMOGLOBIN A1C: Hemoglobin A1C: 6.8

## 2017-11-06 LAB — BAYER DCA HB A1C WAIVED: HB A1C: 6.8 % (ref <7.0)

## 2017-11-06 NOTE — Assessment & Plan Note (Signed)
Stable, continue present medications.   

## 2017-11-06 NOTE — Progress Notes (Signed)
BP 106/76   Pulse (!) 111   Temp 97.8 F (36.6 C) (Oral)   Ht 5\' 5"  (1.651 m)   Wt 220 lb 6.4 oz (100 kg)   LMP  (LMP Unknown)   SpO2 98%   BMI 36.68 kg/m    Subjective:    [name], female    DOB: September 02, 1945, 72 y.o.   MRN: [id number]  HPI: Catherine Hinton is a 72 y.o. female  Chief Complaint  Patient presents with  . Diabetes  . Hyperlipidemia  . Hypertension   Diabetes: Using medications without difficulties No hypoglycemic episodes No hyperglycemic episodes Feet problems: "stay half-way numb"   Blood Sugars averaging: 128 this AM eye exam within last year Last Hgb A1C: 6.2%  Hypertension  Using medications without difficulty Average home BPs "always good"   Using medication without problems or lightheadedness No chest pain with exertion or shortness of breath No Edema  Elevated Cholesterol Using medications without problems No Muscle aches  Diet: Exercise: Doesn't walk but stays active  Relevant past medical, surgical, family and social history reviewed and updated as indicated. Interim medical history since our last visit reviewed. Allergies and medications reviewed and updated.  Review of Systems  Per HPI unless specifically indicated above     Objective:    BP 106/76   Pulse (!) 111   Temp 97.8 F (36.6 C) (Oral)   Ht 5\' 5"  (1.651 m)   Wt 220 lb 6.4 oz (100 kg)   LMP  (LMP Unknown)   SpO2 98%   BMI 36.68 kg/m   Wt Readings from Last 3 Encounters:  11/06/17 220 lb 6.4 oz (100 kg)  04/26/17 216 lb 4.8 oz (98.1 kg)  04/04/17 219 lb 14.4 oz (99.7 kg)    Physical Exam  Constitutional: She is oriented to person, place, and time. She appears well-developed and well-nourished. No distress.  HENT:  Head: Normocephalic and atraumatic.  Eyes: Conjunctivae and lids are normal. Right eye exhibits no discharge. Left eye exhibits no discharge. No scleral icterus.  Neck: Normal range of motion. Neck supple. No JVD present.  Carotid bruit is not present.  Cardiovascular: Normal rate, regular rhythm and normal heart sounds.  Pulmonary/Chest: Effort normal and breath sounds normal.  Abdominal: Normal appearance. There is no splenomegaly or hepatomegaly.  Musculoskeletal: Normal range of motion.  Neurological: She is alert and oriented to person, place, and time.  Skin: Skin is warm, dry and intact. No rash noted. No pallor.  Psychiatric: She has a normal mood and affect. Her behavior is normal. Judgment and thought content normal.    Results for orders placed or performed in visit on 11/06/17  HM DIABETES EYE EXAM  Result Value Ref Range   HM Diabetic Eye Exam No Retinopathy No Retinopathy      Assessment & Plan:   Problem List Items Addressed This Visit      Unprioritized   Hyperlipemia    Stable, continue present medications.        Relevant Orders   Lipid Panel w/o Chol/HDL Ratio   Hypertension    Stable, continue present medications.        Relevant Orders   Comprehensive metabolic panel   RESOLVED: IFG (impaired fasting glucose) - Primary   Relevant Orders   Bayer DCA Hb A1c Waived   Type 2 diabetes mellitus with diabetic neuropathy, unspecified (HCC)    Hgb A1C is 6.8%  This is worsening.  Encouraged working on diet.  Pt feels there are changes she can make          Follow up plan: Return in about 3 months (around 02/06/2018).

## 2017-11-06 NOTE — Assessment & Plan Note (Addendum)
Hgb A1C is 6.8%  This is worsening.  Encouraged working on diet.  Pt feels there are changes she can make

## 2017-11-07 LAB — COMPREHENSIVE METABOLIC PANEL
A/G RATIO: 2.3 — AB (ref 1.2–2.2)
ALBUMIN: 4.8 g/dL (ref 3.6–4.8)
ALT: 22 IU/L (ref 0–32)
AST: 21 IU/L (ref 0–40)
Alkaline Phosphatase: 50 IU/L (ref 39–117)
BILIRUBIN TOTAL: 0.4 mg/dL (ref 0.0–1.2)
BUN / CREAT RATIO: 15 (ref 12–28)
BUN: 8 mg/dL (ref 8–27)
CHLORIDE: 98 mmol/L (ref 96–106)
CO2: 25 mmol/L (ref 20–29)
CREATININE: 0.55 mg/dL — AB (ref 0.57–1.00)
Calcium: 9.8 mg/dL (ref 8.7–10.3)
GFR calc Af Amer: 111 mL/min/{1.73_m2} (ref >59)
GFR calc non Af Amer: 96 mL/min/{1.73_m2} (ref >59)
GLOBULIN, TOTAL: 2.1 g/dL (ref 1.5–4.5)
Glucose: 126 mg/dL — ABNORMAL HIGH (ref 65–99)
POTASSIUM: 4 mmol/L (ref 3.5–5.2)
SODIUM: 141 mmol/L (ref 134–144)
Total Protein: 6.9 g/dL (ref 6.0–8.5)

## 2017-11-07 LAB — LIPID PANEL W/O CHOL/HDL RATIO
CHOLESTEROL TOTAL: 139 mg/dL (ref 100–199)
HDL: 34 mg/dL — ABNORMAL LOW (ref >39)
LDL CALC: 74 mg/dL (ref 0–99)
Triglycerides: 155 mg/dL — ABNORMAL HIGH (ref 0–149)
VLDL Cholesterol Cal: 31 mg/dL (ref 5–40)

## 2017-11-08 ENCOUNTER — Encounter: Payer: Self-pay | Admitting: Unknown Physician Specialty

## 2017-11-29 ENCOUNTER — Other Ambulatory Visit: Payer: Self-pay | Admitting: Unknown Physician Specialty

## 2017-11-29 ENCOUNTER — Other Ambulatory Visit: Payer: Self-pay | Admitting: Family Medicine

## 2017-11-29 NOTE — Telephone Encounter (Signed)
metoprolol refill Last Refill:01/18/17 # 90 Last OV: 11/06/17 PCP: Kathrine Haddock NP Pharmacy:[facility]

## 2017-11-29 NOTE — Telephone Encounter (Signed)
Valsartan-HCTZ refill Last Refill:07/29/17 # 90 No RF Last OV: 11/06/17 PCP: Kathrine Haddock NP Pharmacy:[facility]

## 2017-12-20 ENCOUNTER — Other Ambulatory Visit: Payer: Self-pay | Admitting: Unknown Physician Specialty

## 2017-12-20 NOTE — Telephone Encounter (Signed)
Allopurinol refill Last Refill:07/29/17 # 90 1 RF Last OV: 11/06/17 PCP: Kathrine Haddock NP [place]  Gemfibrozil refill Last Refill:07/29/17 # 180 1 RF Last OV: 11/06/17    Metformin refill Last Refill:07/29/17 # 180 1 RF Last OV: 11/06/17  Last Hgb A1C: 11/06/17

## 2018-01-03 ENCOUNTER — Encounter: Payer: Self-pay | Admitting: Unknown Physician Specialty

## 2018-01-21 DIAGNOSIS — H04129 Dry eye syndrome of unspecified lacrimal gland: Secondary | ICD-10-CM | POA: Diagnosis not present

## 2018-01-21 DIAGNOSIS — H02883 Meibomian gland dysfunction of right eye, unspecified eyelid: Secondary | ICD-10-CM | POA: Diagnosis not present

## 2018-01-21 DIAGNOSIS — H26492 Other secondary cataract, left eye: Secondary | ICD-10-CM | POA: Diagnosis not present

## 2018-01-21 DIAGNOSIS — H02886 Meibomian gland dysfunction of left eye, unspecified eyelid: Secondary | ICD-10-CM | POA: Diagnosis not present

## 2018-01-21 DIAGNOSIS — E119 Type 2 diabetes mellitus without complications: Secondary | ICD-10-CM | POA: Diagnosis not present

## 2018-01-21 DIAGNOSIS — H353131 Nonexudative age-related macular degeneration, bilateral, early dry stage: Secondary | ICD-10-CM | POA: Diagnosis not present

## 2018-01-21 DIAGNOSIS — H401131 Primary open-angle glaucoma, bilateral, mild stage: Secondary | ICD-10-CM | POA: Diagnosis not present

## 2018-01-21 DIAGNOSIS — H40019 Open angle with borderline findings, low risk, unspecified eye: Secondary | ICD-10-CM | POA: Diagnosis not present

## 2018-02-07 ENCOUNTER — Ambulatory Visit: Payer: Medicare Other | Admitting: Unknown Physician Specialty

## 2018-02-10 ENCOUNTER — Ambulatory Visit: Payer: Medicare Other | Admitting: Physician Assistant

## 2018-02-18 ENCOUNTER — Ambulatory Visit (INDEPENDENT_AMBULATORY_CARE_PROVIDER_SITE_OTHER): Payer: Medicare Other | Admitting: Physician Assistant

## 2018-02-18 ENCOUNTER — Encounter: Payer: Self-pay | Admitting: Physician Assistant

## 2018-02-18 VITALS — BP 134/78 | HR 108 | Temp 99.0°F | Ht 65.0 in | Wt 218.2 lb

## 2018-02-18 DIAGNOSIS — E1140 Type 2 diabetes mellitus with diabetic neuropathy, unspecified: Secondary | ICD-10-CM

## 2018-02-18 DIAGNOSIS — E7800 Pure hypercholesterolemia, unspecified: Secondary | ICD-10-CM | POA: Diagnosis not present

## 2018-02-18 DIAGNOSIS — I10 Essential (primary) hypertension: Secondary | ICD-10-CM

## 2018-02-18 DIAGNOSIS — Z23 Encounter for immunization: Secondary | ICD-10-CM | POA: Diagnosis not present

## 2018-02-18 LAB — BAYER DCA HB A1C WAIVED: HB A1C (BAYER DCA - WAIVED): 6.2 % (ref <7.0)

## 2018-02-18 NOTE — Progress Notes (Signed)
Subjective:    [name], female    DOB: 1946/06/03, 72 y.o.   MRN: [id number]  Catherine Hinton is a 72 y.o. female presenting on 02/18/2018 for Diabetes   HPI   Hyperlipidemia: Patient presents with hyperlipidemia.negative for chest pain, heart attack, stroke. There is a family history of hyperlipidemia.. Statin was deferred by PCP, does have DM.   Lipid Panel     Component Value Date/Time   CHOL 139 11/06/2017 1106   CHOL 123 10/23/2016 1016   TRIG 155 (H) 11/06/2017 1106   TRIG 172 (H) 10/23/2016 1016   HDL 34 (L) 11/06/2017 1106   VLDL 34 (H) 10/23/2016 1016   LDLCALC 74 11/06/2017 1106     Hypertension: Patient here for follow-up of elevated blood pressure. She is not exercising and is not adherent to low salt diet.  Blood pressure is well controlled at home. Cardiac symptoms none. Patient denies chest pain, chest pressure/discomfort, dyspnea and irregular heart beat.  She does have lower extremity edema which is normal for her. Cardiovascular risk factors: advanced age (older than 46 for men, 53 for women), diabetes mellitus, dyslipidemia, hypertension and obesity (BMI >= 30 kg/m2). Use of agents associated with hypertension: none. History of target organ damage: none.  Diabetes Mellitus Type II, Follow-up: Patient here for follow-up of Type 2 diabetes mellitus.  Current symptoms/problems include none and have been stable.   Known diabetic complications: none Cardiovascular risk factors: advanced age (older than 55 for men, 68 for women), diabetes mellitus, dyslipidemia, hypertension, obesity (BMI >= 30 kg/m2) and sedentary lifestyle Current diabetic medications include oral agent (monotherapy): metformin (generic). 500 mg BID metformin  Eye exam current (within one year): yes Weight trend: stable Prior visit with dietician: no Current diet: not asked Current exercise: none  Any episodes of hypoglycemia? no  Is She on ACE inhibitor or angiotensin II  receptor blocker?  Yes  valsartan + HCTZ (Diovan HCT)    Social History   Tobacco Use  . Smoking status: Never Smoker  . Smokeless tobacco: Never Used  Substance Use Topics  . Alcohol use: No    Alcohol/week: 0.0 standard drinks  . Drug use: No    Review of Systems Per HPI unless specifically indicated above     Objective:    BP 134/78   Pulse (!) 108   Temp 99 F (37.2 C) (Oral)   Ht 5\' 5"  (1.651 m)   Wt 218 lb 3.2 oz (99 kg)   LMP  (LMP Unknown)   SpO2 98%   BMI 36.31 kg/m   Wt Readings from Last 3 Encounters:  02/18/18 218 lb 3.2 oz (99 kg)  11/06/17 220 lb 6.4 oz (100 kg)  04/26/17 216 lb 4.8 oz (98.1 kg)    Physical Exam  Constitutional: She is oriented to person, place, and time. She appears well-developed and well-nourished.  Cardiovascular: Normal rate and regular rhythm.  Pulmonary/Chest: Effort normal and breath sounds normal.  Musculoskeletal: She exhibits edema.  Neurological: She is alert and oriented to person, place, and time.  Skin: Skin is warm and dry.  Psychiatric: She has a normal mood and affect. Her behavior is normal.   Results for orders placed or performed in visit on 02/18/18  Bayer DCA Hb A1c Waived (STAT)  Result Value Ref Range   HB A1C (BAYER DCA - WAIVED) 6.2 <7.0 %      Assessment & Plan:  1. Essential hypertension  Continue metoprolol succinate 25 mg daily  and Diovan HCT 160-12.5 mg.   2. Type 2 diabetes mellitus with diabetic neuropathy, without long-term current use of insulin (HCC)  Continue 500 mg metformin BID. A1c controlled at 6.2%.   - Bayer DCA Hb A1c Waived (STAT)  3. Pure hypercholesterolemia  Lopid 600 mg daily. Statin had been deferred.  4. Need for influenza vaccination  - Flu vaccine HIGH DOSE PF    Follow up plan: Return in about 3 months (around 05/20/2018) for HTN, HLD, DM II .  Carles Collet, PA-C [facility] 02/18/2018, 4:39 PM

## 2018-02-18 NOTE — Patient Instructions (Signed)

## 2018-03-18 ENCOUNTER — Other Ambulatory Visit: Payer: Self-pay | Admitting: Unknown Physician Specialty

## 2018-03-18 DIAGNOSIS — N63 Unspecified lump in unspecified breast: Secondary | ICD-10-CM

## 2018-03-20 ENCOUNTER — Other Ambulatory Visit: Payer: Self-pay | Admitting: Unknown Physician Specialty

## 2018-03-20 NOTE — Telephone Encounter (Signed)
Requested medication (s) are due for refill today: yes  Requested medication (s) are on the active medication list: yes    Last refill: 09/18/17  #90  1 refill  Future visit scheduled yes  3 weeks  Notes to clinic:  Requested Prescriptions  Pending Prescriptions Disp Refills   cyclobenzaprine (FLEXERIL) 10 MG tablet [Pharmacy Med Name: CYCLOBENZAPRINE HYDROCHLORIDE 10 MG Tablet] 90 tablet 1    Sig: TAKE 1 TABLET AT BEDTIME     Not Delegated - Analgesics:  Muscle Relaxants Failed - 03/20/2018  3:32 PM      Failed - This refill cannot be delegated      Passed - Valid encounter within last 6 months    Recent Outpatient Visits          1 month ago Essential hypertension   [facility], Adriana M, PA-C   4 months ago IFG (impaired fasting glucose)   [facility] Kathrine Haddock, NP   10 months ago Essential hypertension   [facility] Kathrine Haddock, NP   1 year ago IFG (impaired fasting glucose)   [facility] Kathrine Haddock, NP   1 year ago Need for influenza vaccination   [facility] Kathrine Haddock, NP      Future Appointments            In 3 weeks  [place], [place]   In 1 month Cannady, Barbaraann Faster, NP [employer]

## 2018-04-09 ENCOUNTER — Ambulatory Visit: Payer: Medicare Other

## 2018-04-10 ENCOUNTER — Ambulatory Visit (INDEPENDENT_AMBULATORY_CARE_PROVIDER_SITE_OTHER): Payer: Medicare Other

## 2018-04-10 VITALS — BP 142/80 | HR 94 | Temp 97.6°F | Resp 16 | Ht 66.0 in | Wt 220.3 lb

## 2018-04-10 DIAGNOSIS — Z0000 Encounter for general adult medical examination without abnormal findings: Secondary | ICD-10-CM

## 2018-04-10 NOTE — Progress Notes (Signed)
Subjective:   Catherine Hinton is a 72 y.o. female who presents for Medicare Annual (Subsequent) preventive examination.  Review of Systems:  Cardiac Risk Factors include: hypertension;dyslipidemia;diabetes mellitus;advanced age (>46men, >46 women);obesity (BMI >30kg/m2)     Objective:     Vitals: BP (!) 142/80 (BP Location: Left Arm, Patient Position: Sitting, Cuff Size: Normal)   Pulse 94   Temp 97.6 F (36.4 C) (Temporal)   Resp 16   Ht 5\' 6"  (1.676 m)   Wt 220 lb 4.8 oz (99.9 kg)   LMP  (LMP Unknown)   BMI 35.56 kg/m   Body mass index is 35.56 kg/m.  Advanced Directives 04/10/2018 04/04/2017 04/25/2016 04/25/2015 04/25/2015 04/25/2015 12/01/2014  Does Patient Have a Medical Advance Directive? Yes Yes Yes - Yes Yes No  Type of Advance Directive [place];Living will [place];Living will - - - [place];Living will -  Does patient want to make changes to medical advance directive? (No Data) - - - Yes - information given - -  Copy of [place] in Chart? - No - copy requested - - No - copy requested - -  Would patient like information on creating a medical advance directive? - - - - No - patient declined information - Yes - Educational materials given  Pre-existing out of facility DNR order (yellow form or pink MOST form) - - - Yellow form placed in chart (order not valid for inpatient use) - - -    Tobacco Social History   Tobacco Use  Smoking Status Never Smoker  Smokeless Tobacco Never Used     Counseling given: Not Answered   Clinical Intake:  Pre-visit preparation completed: Yes  Pain : 0-10 Pain Score: 1  Pain Type: Chronic pain Pain Location: Foot Pain Orientation: Right, Left Pain Descriptors / Indicators: Tingling Pain Onset: More than a month ago Pain Frequency: Constant     Nutritional Status: BMI > 30  Obese Nutritional Risks: None Diabetes: Yes CBG done?: No Did pt.  bring in CBG monitor from home?: No   Nutrition Risk Assessment:  Has the patient had any N/V/D within the last 2 months?  No  Does the patient have any non-healing wounds?  No  Has the patient had any unintentional weight loss or weight gain?  No   Diabetes:  Is the patient diabetic?  Yes  If diabetic, was a CBG obtained today?  No  Did the patient bring in their glucometer from home?  No  How often do you monitor your CBG's? 3 times per week.   Financial Strains and Diabetes Management:  Are you having any financial strains with the device, your supplies or your medication? No .  Would the patient like to be referred to a Nutritionist or for Diabetic Management?  No   Diabetic Exams:  Diabetic Eye Exam: Completed 08/12/17. Completed by Dr. [name].  Diabetic Foot Exam: Completed 11/06/17.   How often do you need to have someone help you when you read instructions, pamphlets, or other written materials from your doctor or pharmacy?: 1 - Never What is the last grade level you completed in school?: associate's degree  Interpreter Needed?: No.  Information entered by :: Clemetine Marker LPN  Past Medical History:  Diagnosis Date  . Diabetes mellitus with renal manifestation ([place])   . Diabetes mellitus without complication ([place])   . Gout   . Hyperlipidemia   . Hypertension   . Hypertensive CKD (  chronic kidney disease)   . Obesity   . Sleep apnea    CPAP   Past Surgical History:  Procedure Laterality Date  . ABDOMINAL HYSTERECTOMY    . CHOLECYSTECTOMY    . HERNIA REPAIR    . THUMB FUSION     Family History  Problem Relation Age of Onset  . Diabetes Mother   . Cancer Mother        Pituitary tumor  . Hyperlipidemia Mother   . Hypertension Mother   . Thyroid disease Mother   . Stroke Mother   . Cancer Father        Lung  . Cancer Brother        Oral  . Hypertension Son   . Cancer Maternal Grandmother        colon  . Blindness Maternal Grandfather   . Arthritis  Paternal Grandmother   . Arthritis Daughter        RA  . Breast cancer Neg Hx    Social History   Socioeconomic History  . Marital status: Divorced    Spouse name: Not on file  . Number of children: Not on file  . Years of education: Not on file  . Highest education level: Associate degree: academic program  Occupational History  . Occupation: retired  Scientific laboratory technician  . Financial resource strain: Not hard at all  . Food insecurity:    Worry: Never true    Inability: Never true  . Transportation needs:    Medical: No    Non-medical: No  Tobacco Use  . Smoking status: Never Smoker  . Smokeless tobacco: Never Used  Substance and Sexual Activity  . Alcohol use: No    Alcohol/week: 0.0 standard drinks  . Drug use: No  . Sexual activity: Never  Lifestyle  . Physical activity:    Days per week: 0 days    Minutes per session: 0 min  . Stress: Patient refused  Relationships  . Social connections:    Talks on phone: More than three times a week    Gets together: More than three times a week    Attends religious service: More than 4 times per year    Active member of club or organization: No    Attends meetings of clubs or organizations: Never    Relationship status: Divorced  Other Topics Concern  . Not on file  Social History Narrative  . Not on file    Outpatient Encounter Medications as of 04/10/2018  Medication Sig  . allopurinol (ZYLOPRIM) 300 MG tablet TAKE 1 TABLET EVERY DAY  . aspirin 81 MG tablet Take 81 mg by mouth daily.  . Cholecalciferol (VITAMIN D-1000 MAX ST) 1000 UNITS tablet Take 1,000 Units by mouth daily.  . cyclobenzaprine (FLEXERIL) 10 MG tablet TAKE 1 TABLET AT BEDTIME  . DHA-EPA-Flaxseed Oil-Vitamin E (THERA TEARS NUTRITION PO) Take 1,200 mg by mouth. 3 soft gels daily  . gemfibrozil (LOPID) 600 MG tablet TAKE 1 TABLET TWICE DAILY  . metFORMIN (GLUCOPHAGE) 500 MG tablet TAKE 1 TABLET TWICE DAILY  . metoprolol succinate (TOPROL-XL) 25 MG 24 hr  tablet TAKE 1 TABLET EVERY DAY  . Multiple Vitamins-Minerals (EYE VITAMINS PO) Take 1 tablet by mouth daily.  . valsartan-hydrochlorothiazide (DIOVAN-HCT) 160-12.5 MG tablet TAKE 1 TABLET EVERY DAY  . XIIDRA 5 % SOLN    No facility-administered encounter medications on file as of 04/10/2018.     Activities of Daily Living In your present state of health, do  you have any difficulty performing the following activities: 04/10/2018  Hearing? N  Comment declines hearing aids  Vision? N  Comment wears glasses  Difficulty concentrating or making decisions? N  Walking or climbing stairs? N  Dressing or bathing? N  Doing errands, shopping? N  Preparing Food and eating ? N  Using the Toilet? N  In the past six months, have you accidently leaked urine? N  Do you have problems with loss of bowel control? N  Managing your Medications? N  Managing your Finances? N  Housekeeping or managing your Housekeeping? N  Some recent data might be hidden    Patient Care Team: Kathrine Haddock, NP as PCP - General (Nurse Practitioner) Anell Barr, OD (Optometry)    Assessment:   This is a routine wellness examination for [place].  Exercise Activities and Dietary recommendations Current Exercise Habits: The patient does not participate in regular exercise at present, Exercise limited by: None identified  Goals    . Exercise 150 minutes per week (moderate activity)       Fall Risk Fall Risk  04/10/2018 04/04/2017 04/25/2016 04/25/2015 12/09/2014  Falls in the past year? Yes No No No No  Number falls in past yr: 1 - - - -  Injury with Fall? No - - - -  Follow up Falls evaluation completed - - - -   FALL RISK PREVENTION PERTAINING TO THE HOME:  Any stairs in or around the home WITH handrails? No  Home free of loose throw rugs in walkways, pet beds, electrical cords, etc? Yes  Adequate lighting in your home to reduce risk of falls? Yes   ASSISTIVE DEVICES UTILIZED TO PREVENT FALLS:  Life  alert? No  Use of a cane, walker or w/c? No  Grab bars in the bathroom? No Shower chair or bench in shower? No  Elevated toilet seat or a handicapped toilet? No   DME ORDERS:  DME order needed?  No   TIMED UP AND GO:  Was the test performed? Yes .  Length of time to ambulate 10 feet: 8 sec.   GAIT:  Appearance of gait: Gait stead-fast and without the use of an assistive device. Education: Fall risk prevention has been discussed.  Intervention(s) required? No   Depression Screen PHQ 2/9 Scores 04/10/2018 04/04/2017 04/25/2016 04/25/2015  PHQ - 2 Score 0 0 0 0  PHQ- 9 Score 5 - 2 -     Cognitive Function     6CIT Screen 04/10/2018 04/04/2017  What Year? 0 points 0 points  What month? 0 points 0 points  What time? 0 points 0 points  Count back from 20 0 points 0 points  Months in reverse 0 points 0 points  Repeat phrase 0 points 0 points  Total Score 0 0    Immunization History  Administered Date(s) Administered  . Influenza, High Dose Seasonal PF 04/25/2016, 04/04/2017, 02/18/2018  . Influenza,inj,Quad PF,6+ Mos 04/25/2015  . Influenza-Unspecified 04/18/2012  . Pneumococcal Conjugate-13 04/20/2014  . Pneumococcal Polysaccharide-23 10/26/2015  . Pneumococcal-Unspecified 06/18/2006  . Tdap 12/12/2010  . Zoster 09/21/2013   Shingles Vaccine: Yes  Due for Shingrix. Education has been provided regarding the importance of this vaccine. Pt has been advised to call insurance company to determine out of pocket expense. Advised may also receive vaccine at local pharmacy or Health Dept. Verbalized acceptance and understanding.  Tdap: complete 12/12/10  Flu Vaccine: complete 02/18/18  Pneumococcal Vaccine: complete 10/26/15  Screening Tests Health Maintenance  Topic Date  Due  . MAMMOGRAM  04/09/2018  . DEXA SCAN  04/11/2019 (Originally 02/03/2013)  . OPHTHALMOLOGY EXAM  08/12/2018  . HEMOGLOBIN A1C  08/19/2018  . FOOT EXAM  11/07/2018  . COLONOSCOPY  08/10/2019  .  TETANUS/TDAP  12/11/2020  . INFLUENZA VACCINE  Completed  . Hepatitis C Screening  Completed  . PNA vac Low Risk Adult  Completed    Cancer Screenings:  Colorectal Screening: Completed 08/09/09. Repeat every 10 years.  Mammogram: Completed 10/08/17. Repeat every 6 months. Scheduled 04/15/18 10:40 am  Bone Density: declined  Lung Cancer Screening: (Low Dose CT Chest recommended if Age 58-80 years, 30 pack-year currently smoking OR have quit w/in 15years.) does not qualify.    Additional Screening:  Hepatitis C Screening: does qualify; Completed 04/25/15  Vision Screening: Recommended annual ophthalmology exams for early detection of glaucoma and other disorders of the eye. Is the patient up to date with their annual eye exam?  Yes  Who is the provider or what is the name of the office in which the pt attends annual eye exams? Dr. [name]  Dental Screening: Recommended annual dental exams for proper oral hygiene  Community Resource Referral:  CRR required this visit?  No       Plan:    I have personally reviewed and addressed the Medicare Annual Wellness questionnaire and have noted the following in the patient's chart:  A. Medical and social history B. Use of alcohol, tobacco or illicit drugs  C. Current medications and supplements D. Functional ability and status E.  Nutritional status F.  Physical activity G. Advance directives H. List of other physicians I.  Hospitalizations, surgeries, and ER visits in previous 12 months J.  [place] such as hearing and vision if needed, cognitive and depression L. Referrals and appointments   In addition, I have reviewed and discussed with patient certain preventive protocols, quality metrics, and best practice recommendations. A written personalized care plan for preventive services as well as general preventive health recommendations were provided to patient.   Signed,  Clemetine Marker, LPN Nurse Health  Advisor   Nurse Notes:

## 2018-04-10 NOTE — Patient Instructions (Addendum)
Catherine Hinton , Thank you for taking time to come for your Medicare Wellness Visit. I appreciate your ongoing commitment to your health goals. Please review the following plan we discussed and let me know if I can assist you in the future.   Screening recommendations/referrals: Colonoscopy: completed  Mammogram: scheudled Bone Density: due now- declined Recommended yearly ophthalmology/optometry visit for glaucoma screening and checkup Recommended yearly dental visit for hygiene and checkup  Vaccinations: Influenza vaccine: up to date  Pneumococcal vaccine: completed series  Tdap vaccine: up to date  Shingles vaccine: shingrix eligible, check with your insurance company for coverage   Advanced directives: Please bring a copy of your health care power of attorney and living will to the office at your convenience.  Conditions/risks identified: water intake recommend 6-8 glasses of water per day  Next appointment: Follow up in one year for your annual wellness exam.    Preventive Care 70 Years and Older, Female Preventive care refers to lifestyle choices and visits with your health care provider that can promote health and wellness. What does preventive care include?  A yearly physical exam. This is also called an annual well check.  Dental exams once or twice a year.  Routine eye exams. Ask your health care provider how often you should have your eyes checked.  Personal lifestyle choices, including:  Daily care of your teeth and gums.  Regular physical activity.  Eating a healthy diet.  Avoiding tobacco and drug use.  Limiting alcohol use.  Practicing safe sex.  Taking low-dose aspirin every day.  Taking vitamin and mineral supplements as recommended by your health care provider. What happens during an annual well check? The services and screenings done by your health care provider during your annual well check will depend on your age, overall health, lifestyle risk  factors, and family history of disease. Counseling  Your health care provider may ask you questions about your:  Alcohol use.  Tobacco use.  Drug use.  Emotional well-being.  Home and relationship well-being.  Sexual activity.  Eating habits.  History of falls.  Memory and ability to understand (cognition).  Work and work Statistician.  Reproductive health. Screening  You may have the following tests or measurements:  Height, weight, and BMI.  Blood pressure.  Lipid and cholesterol levels. These may be checked every 5 years, or more frequently if you are over 70 years old.  Skin check.  Lung cancer screening. You may have this screening every year starting at age 70 if you have a 30-pack-year history of smoking and currently smoke or have quit within the past 15 years.  Fecal occult blood test (FOBT) of the stool. You may have this test every year starting at age 70.  Flexible sigmoidoscopy or colonoscopy. You may have a sigmoidoscopy every 5 years or a colonoscopy every 10 years starting at age 70.  Hepatitis C blood test.  Hepatitis B blood test.  Sexually transmitted disease (STD) testing.  Diabetes screening. This is done by checking your blood sugar (glucose) after you have not eaten for a while (fasting). You may have this done every 1-3 years.  Bone density scan. This is done to screen for osteoporosis. You may have this done starting at age 70.  Mammogram. This may be done every 1-2 years. Talk to your health care provider about how often you should have regular mammograms. Talk with your health care provider about your test results, treatment options, and if necessary, the need for more tests.  Vaccines  Your health care provider may recommend certain vaccines, such as:  Influenza vaccine. This is recommended every year.  Tetanus, diphtheria, and acellular pertussis (Tdap, Td) vaccine. You may need a Td booster every 10 years.  Zoster vaccine. You  may need this after age 70.  Pneumococcal 13-valent conjugate (PCV13) vaccine. One dose is recommended after age 70.  Pneumococcal polysaccharide (PPSV23) vaccine. One dose is recommended after age 70. Talk to your health care provider about which screenings and vaccines you need and how often you need them. This information is not intended to replace advice given to you by your health care provider. Make sure you discuss any questions you have with your health care provider. Document Released: 07/01/2015 Document Revised: 02/22/2016 Document Reviewed: 04/05/2015 Elsevier Interactive Patient Education  2017 [place] Prevention in the Home Falls can cause injuries. They can happen to people of all ages. There are many things you can do to make your home safe and to help prevent falls. What can I do on the outside of my home?  Regularly fix the edges of walkways and driveways and fix any cracks.  Remove anything that might make you trip as you walk through a door, such as a raised step or threshold.  Trim any bushes or trees on the path to your home.  Use bright outdoor lighting.  Clear any walking paths of anything that might make someone trip, such as rocks or tools.  Regularly check to see if handrails are loose or broken. Make sure that both sides of any steps have handrails.  Any raised decks and porches should have guardrails on the edges.  Have any leaves, snow, or ice cleared regularly.  Use sand or salt on walking paths during winter.  Clean up any spills in your garage right away. This includes oil or grease spills. What can I do in the bathroom?  Use night lights.  Install grab bars by the toilet and in the tub and shower. Do not use towel bars as grab bars.  Use non-skid mats or decals in the tub or shower.  If you need to sit down in the shower, use a plastic, non-slip stool.  Keep the floor dry. Clean up any water that spills on the floor as soon as  it happens.  Remove soap buildup in the tub or shower regularly.  Attach bath mats securely with double-sided non-slip rug tape.  Do not have throw rugs and other things on the floor that can make you trip. What can I do in the bedroom?  Use night lights.  Make sure that you have a light by your bed that is easy to reach.  Do not use any sheets or blankets that are too big for your bed. They should not hang down onto the floor.  Have a firm chair that has side arms. You can use this for support while you get dressed.  Do not have throw rugs and other things on the floor that can make you trip. What can I do in the kitchen?  Clean up any spills right away.  Avoid walking on wet floors.  Keep items that you use a lot in easy-to-reach places.  If you need to reach something above you, use a strong step stool that has a grab bar.  Keep electrical cords out of the way.  Do not use floor polish or wax that makes floors slippery. If you must use wax, use non-skid floor wax.  Do not have throw rugs and other things on the floor that can make you trip. What can I do with my stairs?  Do not leave any items on the stairs.  Make sure that there are handrails on both sides of the stairs and use them. Fix handrails that are broken or loose. Make sure that handrails are as long as the stairways.  Check any carpeting to make sure that it is firmly attached to the stairs. Fix any carpet that is loose or worn.  Avoid having throw rugs at the top or bottom of the stairs. If you do have throw rugs, attach them to the floor with carpet tape.  Make sure that you have a light switch at the top of the stairs and the bottom of the stairs. If you do not have them, ask someone to add them for you. What else can I do to help prevent falls?  Wear shoes that:  Do not have high heels.  Have rubber bottoms.  Are comfortable and fit you well.  Are closed at the toe. Do not wear sandals.  If  you use a stepladder:  Make sure that it is fully opened. Do not climb a closed stepladder.  Make sure that both sides of the stepladder are locked into place.  Ask someone to hold it for you, if possible.  Clearly mark and make sure that you can see:  Any grab bars or handrails.  First and last steps.  Where the edge of each step is.  Use tools that help you move around (mobility aids) if they are needed. These include:  Canes.  Walkers.  Scooters.  Crutches.  Turn on the lights when you go into a dark area. Replace any light bulbs as soon as they burn out.  Set up your furniture so you have a clear path. Avoid moving your furniture around.  If any of your floors are uneven, fix them.  If there are any pets around you, be aware of where they are.  Review your medicines with your doctor. Some medicines can make you feel dizzy. This can increase your chance of falling. Ask your doctor what other things that you can do to help prevent falls. This information is not intended to replace advice given to you by your health care provider. Make sure you discuss any questions you have with your health care provider. Document Released: 03/31/2009 Document Revised: 11/10/2015 Document Reviewed: 07/09/2014 Elsevier Interactive Patient Education  2017 [employer].

## 2018-04-11 ENCOUNTER — Other Ambulatory Visit: Payer: No Typology Code available for payment source

## 2018-04-15 ENCOUNTER — Other Ambulatory Visit: Payer: Self-pay | Admitting: Family Medicine

## 2018-04-15 ENCOUNTER — Ambulatory Visit
Admission: RE | Admit: 2018-04-15 | Discharge: 2018-04-15 | Disposition: A | Discharge status: Home / Self Care | Payer: Medicare Other | Source: Ambulatory Visit | Attending: Unknown Physician Specialty | Admitting: Unknown Physician Specialty

## 2018-04-15 ENCOUNTER — Encounter: Payer: Self-pay | Admitting: Family Medicine

## 2018-04-15 DIAGNOSIS — N63 Unspecified lump in unspecified breast: Secondary | ICD-10-CM

## 2018-04-15 DIAGNOSIS — R928 Other abnormal and inconclusive findings on diagnostic imaging of breast: Secondary | ICD-10-CM

## 2018-04-15 DIAGNOSIS — N6310 Unspecified lump in the right breast, unspecified quadrant: Secondary | ICD-10-CM | POA: Diagnosis not present

## 2018-04-15 DIAGNOSIS — N6311 Unspecified lump in the right breast, upper outer quadrant: Secondary | ICD-10-CM | POA: Diagnosis not present

## 2018-04-23 ENCOUNTER — Other Ambulatory Visit: Payer: Self-pay | Admitting: Unknown Physician Specialty

## 2018-04-23 ENCOUNTER — Other Ambulatory Visit: Payer: Self-pay | Admitting: Nurse Practitioner

## 2018-04-23 MED ORDER — CYCLOBENZAPRINE HCL 10 MG PO TABS: 10.0000 mg | ORAL_TABLET | Freq: Every day | ORAL | 0 refills | Status: DC

## 2018-04-23 NOTE — Progress Notes (Signed)
Patient requesting refill on Flexeril.  Last prescribed for 30 days supply on 03/20/18.  Original prescription in April 2019.  Will provide 30 day supply and discuss use of medication with patient at 05/06/18 visit and discuss alternate options.

## 2018-04-23 NOTE — Telephone Encounter (Signed)
Requested medication (s) are due for refill today: yes  Requested medication (s) are on the active medication list: yes  Last refill:  03/20/18  Future visit scheduled: yes  Notes to clinic:  Sending back to provider- muscle relaxer   Requested Prescriptions  Pending Prescriptions Disp Refills   cyclobenzaprine (FLEXERIL) 10 MG tablet [Pharmacy Med Name: CYCLOBENZAPRINE HYDROCHLORIDE 10 MG Tablet] 90 tablet     Sig: TAKE 1 TABLET AT BEDTIME     Not Delegated - Analgesics:  Muscle Relaxants Failed - 04/23/2018 10:37 AM      Failed - This refill cannot be delegated      Passed - Valid encounter within last 6 months    Recent Outpatient Visits          2 months ago Essential hypertension   [place], Adriana M, PA-C   5 months ago IFG (impaired fasting glucose)   [facility] Kathrine Haddock, NP   12 months ago Essential hypertension   [facility] Kathrine Haddock, NP   1 year ago IFG (impaired fasting glucose)   [facility] Kathrine Haddock, NP   1 year ago Need for influenza vaccination   [facility] Kathrine Haddock, NP      Future Appointments            In 1 week Cannady, Barbaraann Faster, NP [facility]   In 27 months  [facility]         Signed Prescriptions Disp Refills   valsartan-hydrochlorothiazide (DIOVAN-HCT) 160-12.5 MG tablet 90 tablet 0    Sig: TAKE 1 TABLET EVERY DAY     Cardiovascular: ARB + Diuretic Combos Failed - 04/23/2018 10:37 AM      Failed - Cr in normal range and within 180 days    Creatinine, Ser  Date Value Ref Range Status  11/06/2017 0.55 (L) 0.57 - 1.00 mg/dL Final         Failed - Last BP in normal range    BP Readings from Last 1 Encounters:  04/10/18 (!) 142/80         Passed - K in normal range and within 180 days    Potassium  Date Value Ref Range Status  11/06/2017 4.0 3.5 - 5.2 mmol/L Final         Passed - Na in normal range  and within 180 days    Sodium  Date Value Ref Range Status  11/06/2017 141 134 - 144 mmol/L Final         Passed - Ca in normal range and within 180 days    Calcium  Date Value Ref Range Status  11/06/2017 9.8 8.7 - 10.3 mg/dL Final         Passed - Patient is not pregnant      Passed - Valid encounter within last 6 months    Recent Outpatient Visits          2 months ago Essential hypertension   [place], Adriana M, PA-C   5 months ago IFG (impaired fasting glucose)   [facility] Kathrine Haddock, NP   12 months ago Essential hypertension   [facility] Kathrine Haddock, NP   1 year ago IFG (impaired fasting glucose)   [facility] Kathrine Haddock, NP   1 year ago Need for influenza vaccination   [facility] Kathrine Haddock, NP      Future Appointments  In 1 week Cannady, Barbaraann Faster, NP [facility]   In 11 months  [facility]          metoprolol succinate (TOPROL-XL) 25 MG 24 hr tablet 90 tablet 0    Sig: TAKE 1 TABLET EVERY DAY     Cardiovascular:  Beta Blockers Failed - 04/23/2018 10:37 AM      Failed - Last BP in normal range    BP Readings from Last 1 Encounters:  04/10/18 (!) 142/80         Passed - Last Heart Rate in normal range    Pulse Readings from Last 1 Encounters:  04/10/18 94         Passed - Valid encounter within last 6 months    Recent Outpatient Visits          2 months ago Essential hypertension   [place], Adriana M, PA-C   5 months ago IFG (impaired fasting glucose)   [facility] Kathrine Haddock, NP   12 months ago Essential hypertension   [facility] Kathrine Haddock, NP   1 year ago IFG (impaired fasting glucose)   [facility] Kathrine Haddock, NP   1 year ago Need for influenza vaccination   [facility] Kathrine Haddock, NP      Future Appointments             In 1 week Cannady, Barbaraann Faster, NP [facility]   In 71 months  [facility]

## 2018-04-23 NOTE — Telephone Encounter (Signed)
Requested Prescriptions  Pending Prescriptions Disp Refills  . cyclobenzaprine (FLEXERIL) 10 MG tablet [Pharmacy Med Name: CYCLOBENZAPRINE HYDROCHLORIDE 10 MG Tablet] 90 tablet     Sig: TAKE 1 TABLET AT BEDTIME     Not Delegated - Analgesics:  Muscle Relaxants Failed - 04/23/2018 10:37 AM      Failed - This refill cannot be delegated      Passed - Valid encounter within last 6 months    Recent Outpatient Visits          2 months ago Essential hypertension   [place], Adriana M, PA-C   5 months ago IFG (impaired fasting glucose)   [facility] Kathrine Haddock, NP   12 months ago Essential hypertension   [place] Kathrine Haddock, NP   1 year ago IFG (impaired fasting glucose)   [name] Kathrine Haddock, NP   1 year ago Need for influenza vaccination   [facility] Kathrine Haddock, NP      Future Appointments            In 1 week Cannady, Barbaraann Faster, NP [facility]   In 10 months  [facility]         . valsartan-hydrochlorothiazide (DIOVAN-HCT) 160-12.5 MG tablet [Pharmacy Med Name: VALSARTAN/HYDROCHLOROTHIAZIDE 160-12.5 MG Tablet] 90 tablet 0    Sig: TAKE 1 TABLET EVERY DAY     Cardiovascular: ARB + Diuretic Combos Failed - 04/23/2018 10:37 AM      Failed - Cr in normal range and within 180 days    Creatinine, Ser  Date Value Ref Range Status  11/06/2017 0.55 (L) 0.57 - 1.00 mg/dL Final         Failed - Last BP in normal range    BP Readings from Last 1 Encounters:  04/10/18 (!) 142/80         Passed - K in normal range and within 180 days    Potassium  Date Value Ref Range Status  11/06/2017 4.0 3.5 - 5.2 mmol/L Final         Passed - Na in normal range and within 180 days    Sodium  Date Value Ref Range Status  11/06/2017 141 134 - 144 mmol/L Final         Passed - Ca in normal range and within 180 days    Calcium  Date Value Ref Range Status   11/06/2017 9.8 8.7 - 10.3 mg/dL Final         Passed - Patient is not pregnant      Passed - Valid encounter within last 6 months    Recent Outpatient Visits          2 months ago Essential hypertension   [place], Adriana M, PA-C   5 months ago IFG (impaired fasting glucose)   [facility] Kathrine Haddock, NP   12 months ago Essential hypertension   [facility] Kathrine Haddock, NP   1 year ago IFG (impaired fasting glucose)   [facility] Kathrine Haddock, NP   1 year ago Need for influenza vaccination   [facility] Kathrine Haddock, NP      Future Appointments            In 1 week Cannady, Barbaraann Faster, NP [facility]   In 60 months  [facility]         . metoprolol succinate (  TOPROL-XL) 25 MG 24 hr tablet [Pharmacy Med Name: METOPROLOL SUCCINATE ER 25 MG Tablet Extended Release 24 Hour] 90 tablet 0    Sig: TAKE 1 TABLET EVERY DAY     Cardiovascular:  Beta Blockers Failed - 04/23/2018 10:37 AM      Failed - Last BP in normal range    BP Readings from Last 1 Encounters:  04/10/18 (!) 142/80         Passed - Last Heart Rate in normal range    Pulse Readings from Last 1 Encounters:  04/10/18 94         Passed - Valid encounter within last 6 months    Recent Outpatient Visits          2 months ago Essential hypertension   [place], Adriana M, PA-C   5 months ago IFG (impaired fasting glucose)   [facility] Kathrine Haddock, NP   12 months ago Essential hypertension   [facility] Kathrine Haddock, NP   1 year ago IFG (impaired fasting glucose)   [facility] Kathrine Haddock, NP   1 year ago Need for influenza vaccination   [facility] Kathrine Haddock, NP      Future Appointments            In 1 week Cannady, Barbaraann Faster, NP [facility]   In 68 months  [facility]

## 2018-05-02 ENCOUNTER — Other Ambulatory Visit: Payer: Self-pay

## 2018-05-05 ENCOUNTER — Encounter: Payer: Self-pay | Admitting: Nurse Practitioner

## 2018-05-05 ENCOUNTER — Ambulatory Visit (INDEPENDENT_AMBULATORY_CARE_PROVIDER_SITE_OTHER): Payer: Medicare Other | Admitting: Nurse Practitioner

## 2018-05-05 ENCOUNTER — Other Ambulatory Visit: Payer: Self-pay

## 2018-05-05 VITALS — BP 116/76 | HR 109 | Temp 98.2°F | Ht 64.5 in | Wt 220.0 lb

## 2018-05-05 DIAGNOSIS — E559 Vitamin D deficiency, unspecified: Secondary | ICD-10-CM | POA: Insufficient documentation

## 2018-05-05 DIAGNOSIS — Z6837 Body mass index (BMI) 37.0-37.9, adult: Secondary | ICD-10-CM | POA: Diagnosis not present

## 2018-05-05 DIAGNOSIS — E1140 Type 2 diabetes mellitus with diabetic neuropathy, unspecified: Secondary | ICD-10-CM | POA: Diagnosis not present

## 2018-05-05 DIAGNOSIS — E538 Deficiency of other specified B group vitamins: Secondary | ICD-10-CM | POA: Diagnosis not present

## 2018-05-05 DIAGNOSIS — G6289 Other specified polyneuropathies: Secondary | ICD-10-CM | POA: Diagnosis not present

## 2018-05-05 DIAGNOSIS — Z0000 Encounter for general adult medical examination without abnormal findings: Secondary | ICD-10-CM | POA: Diagnosis not present

## 2018-05-05 DIAGNOSIS — E7800 Pure hypercholesterolemia, unspecified: Secondary | ICD-10-CM

## 2018-05-05 DIAGNOSIS — I10 Essential (primary) hypertension: Secondary | ICD-10-CM

## 2018-05-05 LAB — BAYER DCA HB A1C WAIVED: HB A1C: 6.4 % (ref <7.0)

## 2018-05-05 NOTE — Patient Instructions (Signed)
Diabetes Mellitus and Nutrition When you have diabetes (diabetes mellitus), it is very important to have healthy eating habits because your blood sugar (glucose) levels are greatly affected by what you eat and drink. Eating healthy foods in the appropriate amounts, at about the same times every day, can help you:  Control your blood glucose.  Lower your risk of heart disease.  Improve your blood pressure.  Reach or maintain a healthy weight.  Every person with diabetes is different, and each person has different needs for a meal plan. Your health care provider may recommend that you work with a diet and nutrition specialist (dietitian) to make a meal plan that is best for you. Your meal plan may vary depending on factors such as:  The calories you need.  The medicines you take.  Your weight.  Your blood glucose, blood pressure, and cholesterol levels.  Your activity level.  Other health conditions you have, such as heart or kidney disease.  How do carbohydrates affect me? Carbohydrates affect your blood glucose level more than any other type of food. Eating carbohydrates naturally increases the amount of glucose in your blood. Carbohydrate counting is a method for keeping track of how many carbohydrates you eat. Counting carbohydrates is important to keep your blood glucose at a healthy level, especially if you use insulin or take certain oral diabetes medicines. It is important to know how many carbohydrates you can safely have in each meal. This is different for every person. Your dietitian can help you calculate how many carbohydrates you should have at each meal and for snack. Foods that contain carbohydrates include:  Bread, cereal, rice, pasta, and crackers.  Potatoes and corn.  Peas, beans, and lentils.  Milk and yogurt.  Fruit and juice.  Desserts, such as cakes, cookies, ice cream, and candy.  How does alcohol affect me? Alcohol can cause a sudden decrease in blood  glucose (hypoglycemia), especially if you use insulin or take certain oral diabetes medicines. Hypoglycemia can be a life-threatening condition. Symptoms of hypoglycemia (sleepiness, dizziness, and confusion) are similar to symptoms of having too much alcohol. If your health care provider says that alcohol is safe for you, follow these guidelines:  Limit alcohol intake to no more than 1 drink per day for nonpregnant women and 2 drinks per day for men. One drink equals 12 oz of beer, 5 oz of wine, or 1 oz of hard liquor.  Do not drink on an empty stomach.  Keep yourself hydrated with water, diet soda, or unsweetened iced tea.  Keep in mind that regular soda, juice, and other mixers may contain a lot of sugar and must be counted as carbohydrates.  What are tips for following this plan? Reading food labels  Start by checking the serving size on the label. The amount of calories, carbohydrates, fats, and other nutrients listed on the label are based on one serving of the food. Many foods contain more than one serving per package.  Check the total grams (g) of carbohydrates in one serving. You can calculate the number of servings of carbohydrates in one serving by dividing the total carbohydrates by 15. For example, if a food has 30 g of total carbohydrates, it would be equal to 2 servings of carbohydrates.  Check the number of grams (g) of saturated and trans fats in one serving. Choose foods that have low or no amount of these fats.  Check the number of milligrams (mg) of sodium in one serving. Most people   should limit total sodium intake to less than 2,300 mg per day.  Always check the nutrition information of foods labeled as "low-fat" or "nonfat". These foods may be higher in added sugar or refined carbohydrates and should be avoided.  Talk to your dietitian to identify your daily goals for nutrients listed on the label. Shopping  Avoid buying canned, premade, or processed foods. These  foods tend to be high in fat, sodium, and added sugar.  Shop around the outside edge of the grocery store. This includes fresh fruits and vegetables, bulk grains, fresh meats, and fresh dairy. Cooking  Use low-heat cooking methods, such as baking, instead of high-heat cooking methods like deep frying.  Cook using healthy oils, such as olive, canola, or sunflower oil.  Avoid cooking with butter, cream, or high-fat meats. Meal planning  Eat meals and snacks regularly, preferably at the same times every day. Avoid going long periods of time without eating.  Eat foods high in fiber, such as fresh fruits, vegetables, beans, and whole grains. Talk to your dietitian about how many servings of carbohydrates you can eat at each meal.  Eat 4-6 ounces of lean protein each day, such as lean meat, chicken, fish, eggs, or tofu. 1 ounce is equal to 1 ounce of meat, chicken, or fish, 1 egg, or 1/4 cup of tofu.  Eat some foods each day that contain healthy fats, such as avocado, nuts, seeds, and fish. Lifestyle   Check your blood glucose regularly.  Exercise at least 30 minutes 5 or more days each week, or as told by your health care provider.  Take medicines as told by your health care provider.  Do not use any products that contain nicotine or tobacco, such as cigarettes and e-cigarettes. If you need help quitting, ask your health care provider.  Work with a counselor or diabetes educator to identify strategies to manage stress and any emotional and social challenges. What are some questions to ask my health care provider?  Do I need to meet with a diabetes educator?  Do I need to meet with a dietitian?  What number can I call if I have questions?  When are the best times to check my blood glucose? Where to find more information:  American Diabetes Association: diabetes.org/food-and-fitness/food  Academy of Nutrition and Dietetics:  www.eatright.org/resources/health/diseases-and-conditions/diabetes  National Institute of Diabetes and Digestive and Kidney Diseases (NIH): www.niddk.nih.gov/health-information/diabetes/overview/diet-eating-physical-activity Summary  A healthy meal plan will help you control your blood glucose and maintain a healthy lifestyle.  Working with a diet and nutrition specialist (dietitian) can help you make a meal plan that is best for you.  Keep in mind that carbohydrates and alcohol have immediate effects on your blood glucose levels. It is important to count carbohydrates and to use alcohol carefully. This information is not intended to replace advice given to you by your health care provider. Make sure you discuss any questions you have with your health care provider. Document Released: 03/01/2005 Document Revised: 07/09/2016 Document Reviewed: 07/09/2016 Elsevier Interactive Patient Education  2018 [employer]  

## 2018-05-05 NOTE — Assessment & Plan Note (Signed)
Chronic, stable on current medication regimen.  CMP and CBC today.

## 2018-05-05 NOTE — Assessment & Plan Note (Addendum)
Annual labs today.

## 2018-05-05 NOTE — Assessment & Plan Note (Addendum)
Chronic, ongoing.  Continues on Metformin.  Last A1C 6.8 and today 6.4%.  Consider reduction of Metformin if continues to decrease A1C level.

## 2018-05-05 NOTE — Assessment & Plan Note (Signed)
Chronic, stable.  Continue on current Lopid.  Lipid panel today.

## 2018-05-05 NOTE — Assessment & Plan Note (Signed)
Chronic, ongoing.  Continue to focus on diet and exercise.

## 2018-05-05 NOTE — Progress Notes (Signed)
BP 116/76   Pulse (!) 109   Temp 98.2 F (36.8 C) (Oral)   Ht 5' 4.5" (1.638 m)   Wt 220 lb (99.8 kg)   LMP  (LMP Unknown)   SpO2 97%   BMI 37.18 kg/m    Subjective:    Patient ID: [name], female    DOB: 1946/02/22, 72 y.o.   MRN: [id number]  HPI: Micalah Cabezas is a 73 y.o. female presents for annual physical  Chief Complaint  Patient presents with  . Annual Exam    no concerns   Continues to care for grandson daily, while her daughter works.  She reports this brings her fulfillment on a daily basis and overall improvement in mood.  HYPERTENSION / HYPERLIPIDEMIA Continues on Lopid for cholesterol treatment.  Metoprolol and Valsartan-HCTZ for BP control.   Satisfied with current treatment? yes Duration of hypertension: chronic BP monitoring frequency: rarely BP range: 110's over 70's BP medication side effects: no Past BP meds:none Duration of hyperlipidemia: chronic Cholesterol medication side effects: no Cholesterol supplements: none Past cholesterol medications:none Medication compliance: excellent compliance Aspirin: yes Recent stressors: no Recurrent headaches: no Visual changes: no Palpitations: no Dyspnea: no Chest pain: no Lower extremity edema: no Dizzy/lightheaded: no   DIABETES WITH NEUROPATHY Continues on Metformin without issue.  Has some neuropathy pain to bilateral feet with L>R.  Takes Flexeril at night for sleep and foot discomfort.   Hypoglycemic episodes:no Polydipsia/polyuria: no Visual disturbance: no Chest pain: no Paresthesias: no Glucose Monitoring: yes  Accucheck frequency: 3-5 times a week  Fasting glucose: 121 this morning, usually in 120 in morning  Post prandial:  Evening:  Before meals: Taking Insulin?: no  Long acting insulin:  Short acting insulin: Blood Pressure Monitoring: rarely Retinal Examination: Up to Date Foot Exam: Up to Date Diabetic Education: Completed Pneumovax: Up to Date Influenza: Up to  Date Aspirin: yes  VITAMIN D DEFICIENCY: Continues on daily Vitamin D.  No recent falls or fractures.  Denies increase in fatigue.    Depression screen [facility] 2/9 05/05/2018 04/10/2018 04/04/2017 04/25/2016 04/25/2015  Decreased Interest 0 0 0 0 0  Down, Depressed, Hopeless 0 0 0 0 0  PHQ - 2 Score 0 0 0 0 0  Altered sleeping 1 2 - 0 -  Tired, decreased energy 1 1 - 1 -  Change in appetite 2 2 - 1 -  Feeling bad or failure about yourself  0 0 - 0 -  Trouble concentrating 0 0 - 0 -  Moving slowly or fidgety/restless 0 0 - 0 -  Suicidal thoughts 0 0 - 0 -  PHQ-9 Score 4 5 - 2 -  Difficult doing work/chores Not difficult at all - - - -   GAD 7 : Generalized Anxiety Score 05/05/2018  Nervous, Anxious, on Edge 0  Control/stop worrying 0  Worry too much - different things 0  Trouble relaxing 0  Restless 0  Easily annoyed or irritable 0  Afraid - awful might happen 0  Total GAD 7 Score 0   Functional Status Survey: Is the patient deaf or have difficulty hearing?: No Does the patient have difficulty seeing, even when wearing glasses/contacts?: Yes Does the patient have difficulty concentrating, remembering, or making decisions?: No Does the patient have difficulty walking or climbing stairs?: No Does the patient have difficulty dressing or bathing?: No Does the patient have difficulty doing errands alone such as visiting a doctor's office or shopping?: No   Social History  Socioeconomic History  . Marital status: Divorced    Spouse name: Not on file  . Number of children: Not on file  . Years of education: Not on file  . Highest education level: Associate degree: academic program  Occupational History  . Occupation: retired  Scientific laboratory technician  . Financial resource strain: Not hard at all  . Food insecurity:    Worry: Never true    Inability: Never true  . Transportation needs:    Medical: No    Non-medical: No  Tobacco Use  . Smoking status: Never Smoker  . Smokeless tobacco:  Never Used  Substance and Sexual Activity  . Alcohol use: No    Alcohol/week: 0.0 standard drinks  . Drug use: No  . Sexual activity: Never  Lifestyle  . Physical activity:    Days per week: 0 days    Minutes per session: 0 min  . Stress: Patient refused  Relationships  . Social connections:    Talks on phone: More than three times a week    Gets together: More than three times a week    Attends religious service: More than 4 times per year    Active member of club or organization: No    Attends meetings of clubs or organizations: Never    Relationship status: Divorced  . Intimate partner violence:    Fear of current or ex partner: No    Emotionally abused: No    Physically abused: No    Forced sexual activity: No  Other Topics Concern  . Not on file  Social History Narrative  . Not on file    Relevant past medical, surgical, family and social history reviewed and updated as indicated. Interim medical history since our last visit reviewed. Allergies and medications reviewed and updated.  Review of Systems  Constitutional: Negative for activity change, appetite change, diaphoresis, fatigue and fever.  HENT: Negative.   Eyes: Negative.   Respiratory: Negative for cough, chest tightness and shortness of breath.   Cardiovascular: Negative for chest pain, palpitations and leg swelling.  Gastrointestinal: Negative for abdominal distention, abdominal pain, constipation, diarrhea, nausea and vomiting.  Endocrine: Negative for cold intolerance, heat intolerance, polydipsia, polyphagia and polyuria.  Genitourinary: Negative.   Musculoskeletal: Negative for back pain, joint swelling, myalgias, neck pain and neck stiffness.  Skin: Negative.   Allergic/Immunologic: Negative.   Neurological: Negative for dizziness, tremors, weakness, light-headedness, numbness and headaches.  Hematological: Negative.   Psychiatric/Behavioral: Negative for behavioral problems, decreased concentration  and sleep disturbance. The patient is not nervous/anxious.     Per HPI unless specifically indicated above     Objective:    BP 116/76   Pulse (!) 109   Temp 98.2 F (36.8 C) (Oral)   Ht 5' 4.5" (1.638 m)   Wt 220 lb (99.8 kg)   LMP  (LMP Unknown)   SpO2 97%   BMI 37.18 kg/m   Wt Readings from Last 3 Encounters:  05/05/18 220 lb (99.8 kg)  04/10/18 220 lb 4.8 oz (99.9 kg)  02/18/18 218 lb 3.2 oz (99 kg)    Physical Exam  Constitutional: She is oriented to person, place, and time. She appears well-developed and well-nourished.  HENT:  Head: Normocephalic and atraumatic.  Right Ear: Hearing, tympanic membrane, external ear and ear canal normal.  Left Ear: Hearing, tympanic membrane, external ear and ear canal normal.  Nose: Nose normal. Right sinus exhibits no maxillary sinus tenderness and no frontal sinus tenderness. Left sinus exhibits no  maxillary sinus tenderness and no frontal sinus tenderness.  Mouth/Throat: Oropharynx is clear and moist.  Eyes: Pupils are equal, round, and reactive to light. Conjunctivae and EOM are normal. Right eye exhibits no discharge. Left eye exhibits no discharge.  Neck: Normal range of motion. Neck supple. No JVD present. Carotid bruit is not present. No thyromegaly present.  Cardiovascular: Normal rate, regular rhythm, normal heart sounds and intact distal pulses.  Pulmonary/Chest: Effort normal and breath sounds normal.  Abdominal: Soft. Bowel sounds are normal. There is no splenomegaly or hepatomegaly.  Musculoskeletal: Normal range of motion.  Lymphadenopathy:    She has no cervical adenopathy.  Neurological: She is alert and oriented to person, place, and time. She has normal strength and normal reflexes. No cranial nerve deficit or sensory deficit. She displays a negative Romberg sign.  Reflex Scores:      Brachioradialis reflexes are 2+ on the right side and 2+ on the left side.      Patellar reflexes are 2+ on the right side and 2+ on  the left side. Skin: Skin is warm and dry.  Psychiatric: She has a normal mood and affect. Her speech is normal and behavior is normal. Judgment and thought content normal. Cognition and memory are normal.   Diabetic Foot Exam - Simple   Simple Foot Form Visual Inspection No deformities, no ulcerations, no other skin breakdown bilaterally:  Yes Sensation Testing See comments:  Yes Pulse Check Posterior Tibialis and Dorsalis pulse intact bilaterally:  Yes Comments Slight decrease sensation bilateral feet with L>R 8/10.     Results for orders placed or performed in visit on 02/18/18  Bayer DCA Hb A1c Waived (STAT)  Result Value Ref Range   HB A1C (BAYER DCA - WAIVED) 6.2 <7.0 %      Assessment & Plan:   Problem List Items Addressed This Visit      Cardiovascular and Mediastinum   Hypertension    Chronic, stable on current medication regimen.  CMP and CBC today.      Relevant Orders   CBC with Differential/Platelet     Endocrine   Type 2 diabetes mellitus with diabetic neuropathy, unspecified (HCC)    Chronic, ongoing.  Continues on Metformin.  Last A1C 6.8 and today 6.4%.  Consider reduction of Metformin if continues to decrease A1C level.      Relevant Orders   CBC with Differential/Platelet   Bayer DCA Hb A1c Waived (STAT)   B12     Nervous and Auditory   Peripheral neuropathy    Chronic with T2DM.  B12 level today, as has been on Metformin for several years, risk for B12 deficiency.  Continue Flexeril at HS.      Relevant Orders   CBC with Differential/Platelet   B12     Other   Hyperlipemia    Chronic, stable.  Continue on current Lopid.  Lipid panel today.      Obesity    Chronic, ongoing.  Continue to focus on diet and exercise.      Vitamin D deficiency    Chronic, ongoing.  On daily supplement.  Vit D level today.      Relevant Orders   VITAMIN D 25 Hydroxy (Vit-D Deficiency, Fractures)    Other Visit Diagnoses    Annual physical exam    -   Primary   Relevant Orders   Comprehensive metabolic panel   Lipid Panel w/o Chol/HDL Ratio   TSH       Follow  up plan: Return in about 6 months (around 11/03/2018) for T2DM, HTN/HLD.

## 2018-05-05 NOTE — Assessment & Plan Note (Signed)
eererer

## 2018-05-05 NOTE — Assessment & Plan Note (Addendum)
Chronic with T2DM.  B12 level today, as has been on Metformin for several years, risk for B12 deficiency.  Continue Flexeril at HS.

## 2018-05-05 NOTE — Assessment & Plan Note (Addendum)
Chronic, ongoing.  On daily supplement.  Vit D level today.

## 2018-05-06 LAB — LIPID PANEL W/O CHOL/HDL RATIO
Cholesterol, Total: 136 mg/dL (ref 100–199)
HDL: 36 mg/dL — ABNORMAL LOW
LDL Calculated: 72 mg/dL (ref 0–99)
Triglycerides: 139 mg/dL (ref 0–149)
VLDL Cholesterol Cal: 28 mg/dL (ref 5–40)

## 2018-05-06 LAB — COMPREHENSIVE METABOLIC PANEL
ALBUMIN: 4.3 g/dL (ref 3.5–4.8)
ALK PHOS: 48 IU/L (ref 39–117)
ALT: 17 IU/L (ref 0–32)
AST: 21 IU/L (ref 0–40)
Albumin/Globulin Ratio: 1.7 (ref 1.2–2.2)
BUN / CREAT RATIO: 18 (ref 12–28)
BUN: 10 mg/dL (ref 8–27)
Bilirubin Total: 0.3 mg/dL (ref 0.0–1.2)
CALCIUM: 9.3 mg/dL (ref 8.7–10.3)
CO2: 24 mmol/L (ref 20–29)
CREATININE: 0.56 mg/dL — AB (ref 0.57–1.00)
Chloride: 98 mmol/L (ref 96–106)
GFR calc Af Amer: 109 mL/min/{1.73_m2} (ref >59)
GFR calc non Af Amer: 95 mL/min/{1.73_m2} (ref >59)
GLUCOSE: 180 mg/dL — AB (ref 65–99)
Globulin, Total: 2.5 g/dL (ref 1.5–4.5)
Potassium: 3.7 mmol/L (ref 3.5–5.2)
Sodium: 141 mmol/L (ref 134–144)
Total Protein: 6.8 g/dL (ref 6.0–8.5)

## 2018-05-06 LAB — CBC WITH DIFFERENTIAL/PLATELET
Basophils Absolute: 0 10*3/uL (ref 0.0–0.2)
Basos: 1 %
EOS (ABSOLUTE): 0.1 10*3/uL (ref 0.0–0.4)
Eos: 2 %
Hematocrit: 36.1 % (ref 34.0–46.6)
Hemoglobin: 12.3 g/dL (ref 11.1–15.9)
Immature Grans (Abs): 0 10*3/uL (ref 0.0–0.1)
Immature Granulocytes: 0 %
Lymphocytes Absolute: 1.8 10*3/uL (ref 0.7–3.1)
Lymphs: 33 %
MCH: 29.5 pg (ref 26.6–33.0)
MCHC: 34.1 g/dL (ref 31.5–35.7)
MCV: 87 fL (ref 79–97)
Monocytes Absolute: 0.4 10*3/uL (ref 0.1–0.9)
Monocytes: 6 %
Neutrophils Absolute: 3.2 10*3/uL (ref 1.4–7.0)
Neutrophils: 58 %
Platelets: 270 10*3/uL (ref 150–450)
RBC: 4.17 x10E6/uL (ref 3.77–5.28)
RDW: 13 % (ref 12.3–15.4)
WBC: 5.5 10*3/uL (ref 3.4–10.8)

## 2018-05-06 LAB — VITAMIN D 25 HYDROXY (VIT D DEFICIENCY, FRACTURES): Vit D, 25-Hydroxy: 37.9 ng/mL (ref 30.0–100.0)

## 2018-05-06 LAB — VITAMIN B12: Vitamin B-12: 534 pg/mL (ref 232–1245)

## 2018-05-06 LAB — TSH: TSH: 1.05 u[IU]/mL (ref 0.450–4.500)

## 2018-05-28 ENCOUNTER — Other Ambulatory Visit: Payer: Self-pay | Admitting: Family Medicine

## 2018-05-28 ENCOUNTER — Other Ambulatory Visit: Payer: Self-pay | Admitting: Unknown Physician Specialty

## 2018-05-29 NOTE — Telephone Encounter (Signed)
Requested medication (s) are due for refill today: Yes  Requested medication (s) are on the active medication list: Yes  Last refill:  12/20/17  Future visit scheduled: Yes  Notes to clinic:  Unable to refill per protocol, failed uric acid, last uric acid 04/25/15.     Requested Prescriptions  Pending Prescriptions Disp Refills   allopurinol (ZYLOPRIM) 300 MG tablet [Pharmacy Med Name: ALLOPURINOL 300 MG Tablet] 90 tablet 0    Sig: TAKE 1 TABLET EVERY DAY     Endocrinology:  Gout Agents Failed - 05/28/2018  9:41 AM      Failed - Uric Acid in normal range and within 360 days    Uric Acid  Date Value Ref Range Status  04/25/2015 5.4 2.5 - 7.1 mg/dL Final    Comment:               Therapeutic target for gout patients: <6.0         Failed - Cr in normal range and within 360 days    Creatinine, Ser  Date Value Ref Range Status  05/05/2018 0.56 (L) 0.57 - 1.00 mg/dL Final         Passed - Valid encounter within last 12 months    Recent Outpatient Visits          3 weeks ago Annual physical exam   [place], Henrine Screws T, NP   3 months ago Essential hypertension   [place], Adriana M, PA-C   6 months ago IFG (impaired fasting glucose)   [facility] Kathrine Haddock, NP   1 year ago Essential hypertension   [facility] Kathrine Haddock, NP   1 year ago IFG (impaired fasting glucose)   [facility] Kathrine Haddock, NP      Future Appointments            In 5 months Cannady, Barbaraann Faster, NP [facility]   In 65 months  [facility]         Signed Prescriptions Disp Refills   gemfibrozil (LOPID) 600 MG tablet 180 tablet 3    Sig: TAKE 1 TABLET TWICE DAILY     Cardiovascular:  Antilipid - Fibric Acid Derivatives Failed - 05/28/2018  9:41 AM      Failed - HDL in normal range and within 360 days    HDL  Date Value Ref Range Status  05/05/2018 36 (L) >39 mg/dL  Final         Failed - Cr in normal range and within 180 days    Creatinine, Ser  Date Value Ref Range Status  05/05/2018 0.56 (L) 0.57 - 1.00 mg/dL Final         Passed - Total Cholesterol in normal range and within 360 days    Cholesterol, Total  Date Value Ref Range Status  05/05/2018 136 100 - 199 mg/dL Final   Cholesterol Piccolo, Waived  Date Value Ref Range Status  10/23/2016 123 <200 mg/dL Final    Comment:                            Desirable                <200                         Borderline High      200- 239  High                     >239          Passed - LDL in normal range and within 360 days    LDL Calculated  Date Value Ref Range Status  05/05/2018 72 0 - 99 mg/dL Final         Passed - Triglycerides in normal range and within 360 days    Triglycerides  Date Value Ref Range Status  05/05/2018 139 0 - 149 mg/dL Final   Triglycerides Piccolo,Waived  Date Value Ref Range Status  10/23/2016 172 (H) <150 mg/dL Final    Comment:                            Normal                   <150                         Borderline High     150 - 199                         High                200 - 499                         Very High                >499          Passed - ALT in normal range and within 180 days    ALT  Date Value Ref Range Status  05/05/2018 17 0 - 32 IU/L Final         Passed - AST in normal range and within 180 days    AST  Date Value Ref Range Status  05/05/2018 21 0 - 40 IU/L Final         Passed - eGFR in normal range and within 180 days    GFR calc Af Amer  Date Value Ref Range Status  05/05/2018 109 >59 mL/min/1.73 Final   GFR calc non Af Amer  Date Value Ref Range Status  05/05/2018 95 >59 mL/min/1.73 Final         Passed - Valid encounter within last 12 months    Recent Outpatient Visits          3 weeks ago Annual physical exam   [place], Henrine Screws T, NP   3 months ago  Essential hypertension   [place], Adriana M, PA-C   6 months ago IFG (impaired fasting glucose)   [facility] Kathrine Haddock, NP   1 year ago Essential hypertension   [facility] Kathrine Haddock, NP   1 year ago IFG (impaired fasting glucose)   [facility] Kathrine Haddock, NP      Future Appointments            In 5 months Cannady, Barbaraann Faster, NP [facility]   In 3 months  [facility]          metFORMIN (GLUCOPHAGE) 500 MG tablet 180 tablet 1    Sig: TAKE 1 TABLET TWICE DAILY     Endocrinology:  Diabetes - Biguanides Failed - 05/28/2018  9:41 AM      Failed - Cr in normal range and within 360 days    Creatinine, Ser  Date Value Ref Range Status  05/05/2018 0.56 (L) 0.57 - 1.00 mg/dL Final         Passed - HBA1C is between 0 and 7.9 and within 180 days    Hemoglobin A1C  Date Value Ref Range Status  11/06/2017 6.8  Final         Passed - eGFR in normal range and within 360 days    GFR calc Af Amer  Date Value Ref Range Status  05/05/2018 109 >59 mL/min/1.73 Final   GFR calc non Af Amer  Date Value Ref Range Status  05/05/2018 95 >59 mL/min/1.73 Final         Passed - Valid encounter within last 6 months    Recent Outpatient Visits          3 weeks ago Annual physical exam   [place] T, NP   3 months ago Essential hypertension   [place], Adriana M, PA-C   6 months ago IFG (impaired fasting glucose)   [facility] Kathrine Haddock, NP   1 year ago Essential hypertension   [facility] Kathrine Haddock, NP   1 year ago IFG (impaired fasting glucose)   [facility] Kathrine Haddock, NP      Future Appointments            In 5 months Cannady, Barbaraann Faster, NP [facility]   In 96 months  [facility], [place]

## 2018-08-04 ENCOUNTER — Other Ambulatory Visit: Payer: Self-pay | Admitting: Nurse Practitioner

## 2018-08-04 ENCOUNTER — Other Ambulatory Visit: Payer: Self-pay | Admitting: Unknown Physician Specialty

## 2018-08-04 NOTE — Telephone Encounter (Signed)
Requested medication (s) are due for refill today: Yes  Requested medication (s) are on the active medication list: Yes  Last refill:  05/30/18  Future visit scheduled: Yes  Notes to clinic:  Unable to refill per protocol cannot delegate and failed items on protocol     Requested Prescriptions  Pending Prescriptions Disp Refills   cyclobenzaprine (FLEXERIL) 10 MG tablet [Pharmacy Med Name: CYCLOBENZAPRINE HYDROCHLORIDE 10 MG Tablet] 30 tablet 0    Sig: TAKE 1 TABLET AT BEDTIME     Not Delegated - Analgesics:  Muscle Relaxants Failed - 08/04/2018  4:16 PM      Failed - This refill cannot be delegated      Passed - Valid encounter within last 6 months    Recent Outpatient Visits          3 months ago Annual physical exam   [place], Henrine Screws T, NP   5 months ago Essential hypertension   [place], Adriana M, PA-C   9 months ago IFG (impaired fasting glucose)   [facility] Kathrine Haddock, NP   1 year ago Essential hypertension   [place] Kathrine Haddock, NP   1 year ago IFG (impaired fasting glucose)   [facility] Kathrine Haddock, NP      Future Appointments            In 3 months Cannady, Barbaraann Faster, NP [facility]   In 8 months  [facility]          allopurinol (ZYLOPRIM) 300 MG tablet [employer] Med Name: ALLOPURINOL 300 MG Tablet] 90 tablet 0    Sig: TAKE [place]     Endocrinology:  Gout Agents Failed - 08/04/2018  4:16 PM      Failed - Uric Acid in normal range and within 360 days    Uric Acid  Date Value Ref Range Status  04/25/2015 5.4 2.5 - 7.1 mg/dL Final    Comment:               Therapeutic target for gout patients: <6.0         Failed - Cr in normal range and within 360 days    Creatinine, Ser  Date Value Ref Range Status  05/05/2018 0.56 (L) 0.57 - 1.00 mg/dL Final         Passed - Valid encounter within last 12 months     Recent Outpatient Visits          3 months ago Annual physical exam   [place], Henrine Screws T, NP   5 months ago Essential hypertension   [place], PA-C   9 months ago IFG (impaired fasting glucose)   [facility] Kathrine Haddock, NP   1 year ago Essential hypertension   [place] Kathrine Haddock, NP   1 year ago IFG (impaired fasting glucose)   [facility] Kathrine Haddock, NP      Future Appointments            In 3 months Cannady, Barbaraann Faster, NP [facility]   In 8 months  [facility]

## 2018-08-05 DIAGNOSIS — H401131 Primary open-angle glaucoma, bilateral, mild stage: Secondary | ICD-10-CM | POA: Diagnosis not present

## 2018-08-05 DIAGNOSIS — H353130 Nonexudative age-related macular degeneration, bilateral, stage unspecified: Secondary | ICD-10-CM | POA: Diagnosis not present

## 2018-08-05 DIAGNOSIS — H02886 Meibomian gland dysfunction of left eye, unspecified eyelid: Secondary | ICD-10-CM | POA: Diagnosis not present

## 2018-08-05 DIAGNOSIS — H26492 Other secondary cataract, left eye: Secondary | ICD-10-CM | POA: Diagnosis not present

## 2018-08-05 DIAGNOSIS — H04129 Dry eye syndrome of unspecified lacrimal gland: Secondary | ICD-10-CM | POA: Diagnosis not present

## 2018-08-05 DIAGNOSIS — E119 Type 2 diabetes mellitus without complications: Secondary | ICD-10-CM | POA: Diagnosis not present

## 2018-08-05 DIAGNOSIS — H02883 Meibomian gland dysfunction of right eye, unspecified eyelid: Secondary | ICD-10-CM | POA: Diagnosis not present

## 2018-09-12 ENCOUNTER — Other Ambulatory Visit: Payer: Self-pay | Admitting: Nurse Practitioner

## 2018-09-12 NOTE — Telephone Encounter (Signed)
Last filled 02/20 for #30

## 2018-09-17 ENCOUNTER — Telehealth: Payer: Self-pay | Admitting: Nurse Practitioner

## 2018-09-17 DIAGNOSIS — Z1239 Encounter for other screening for malignant neoplasm of breast: Secondary | ICD-10-CM

## 2018-09-17 DIAGNOSIS — R928 Other abnormal and inconclusive findings on diagnostic imaging of breast: Secondary | ICD-10-CM

## 2018-09-17 NOTE — Telephone Encounter (Signed)
Copied from [place] [id number]. Topic: Referral - Status >> Sep 17, 2018  2:45 PM Parke Poisson wrote: Reason for CRM: Per [place] [facility] all diagnostic mammograms need to be TOMO.Pt will need an order for diagnostic bilateral TOMO [id number] and breast ultrasound will need to be changed to limited [id number] instead of complete

## 2018-09-17 NOTE — Telephone Encounter (Signed)
Orders placed.

## 2018-09-18 NOTE — Telephone Encounter (Signed)
Sarah notified.  

## 2018-09-18 NOTE — Addendum Note (Signed)
Addended by: Gerda Diss A on: 09/18/2018 09:29 AM   Modules accepted: Orders

## 2018-10-01 IMAGING — US ULTRASOUND RIGHT BREAST LIMITED
1 series · 7 of 7 positions shown · non-contrast
Comparison: Previous exam(s).

CLINICAL DATA: Followup probably benign complicated cyst in the 10
o'clock position of the right breast.

EXAM:
ULTRASOUND OF THE RIGHT BREAST

[Series 1: ultrasound right breast limited · 0.06mm/px · 7 of 7 slices shown]
[im 1/7]
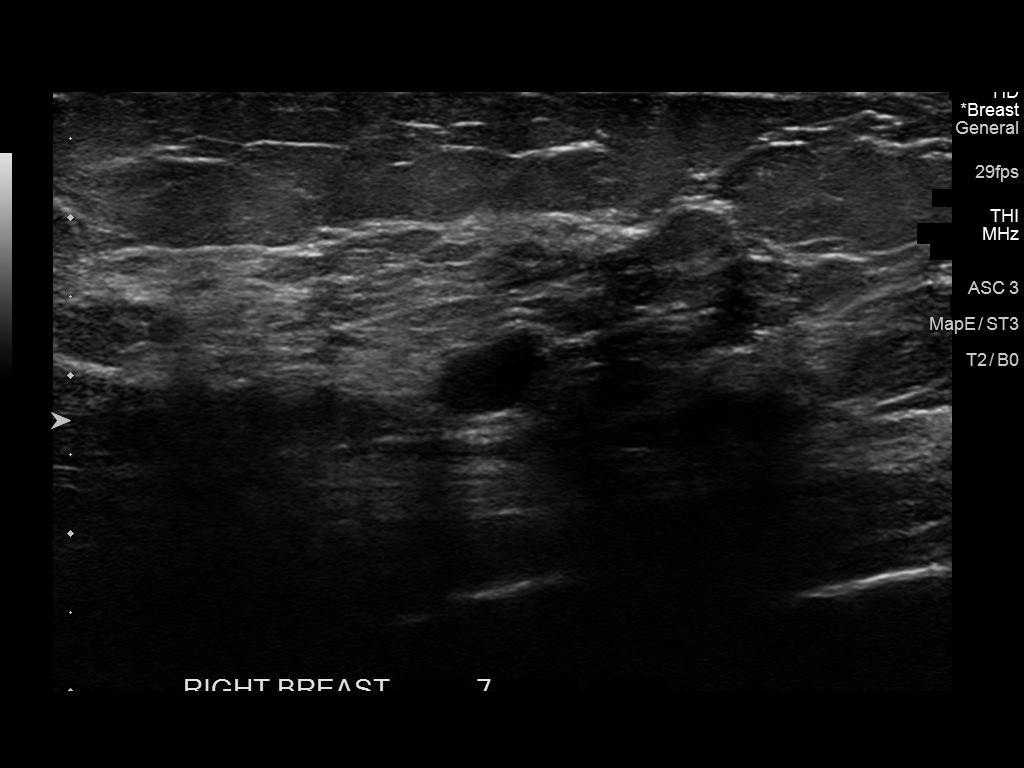
[im 2/7]
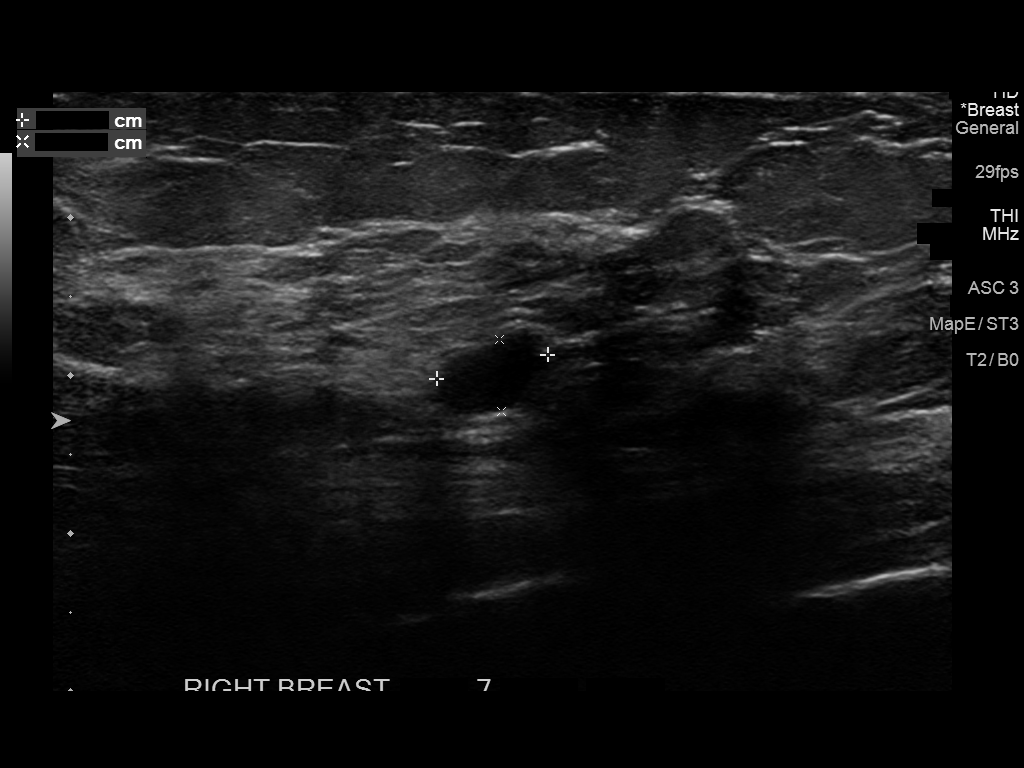
[im 3/7]
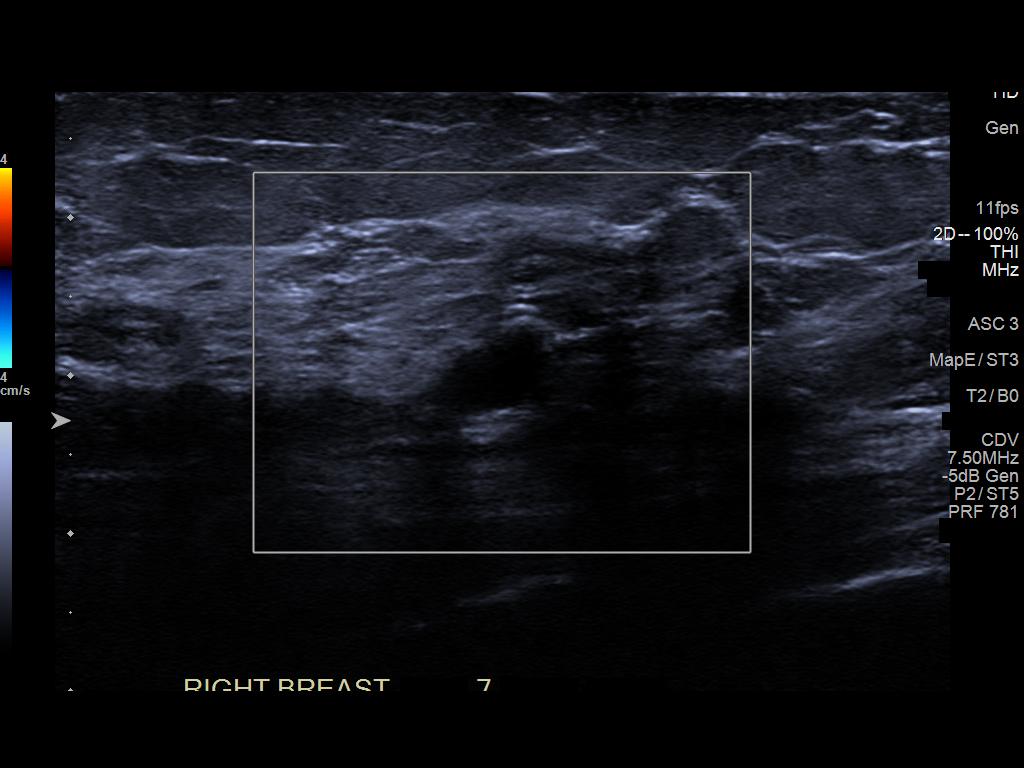
[im 4/7]
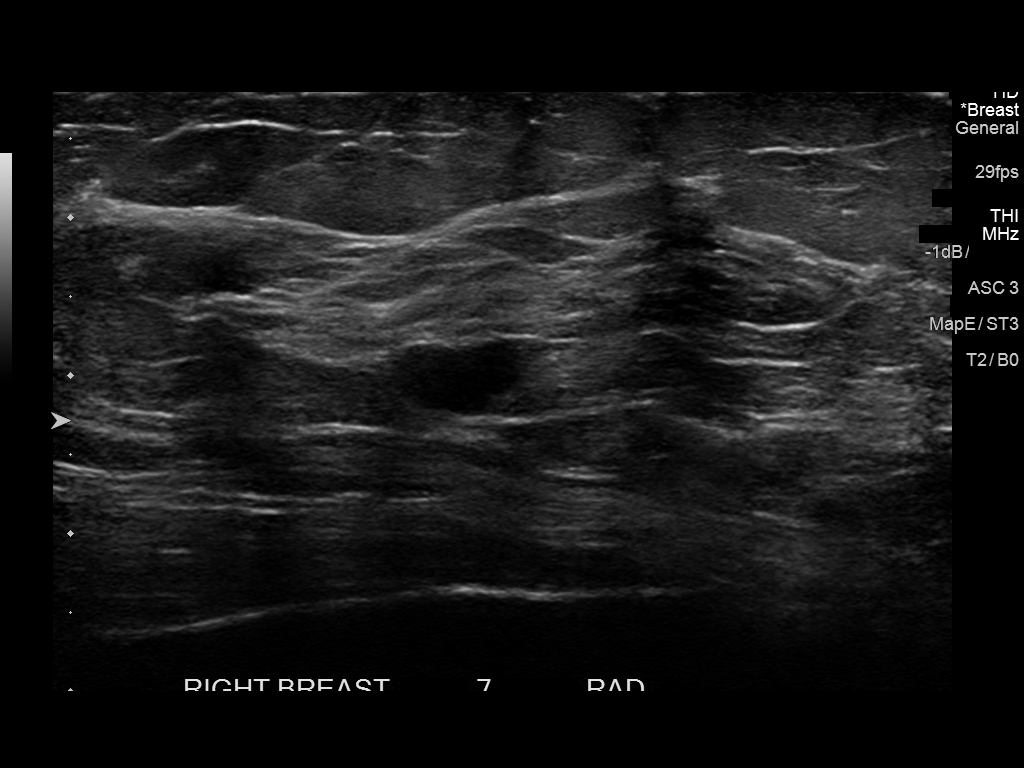
[im 5/7]
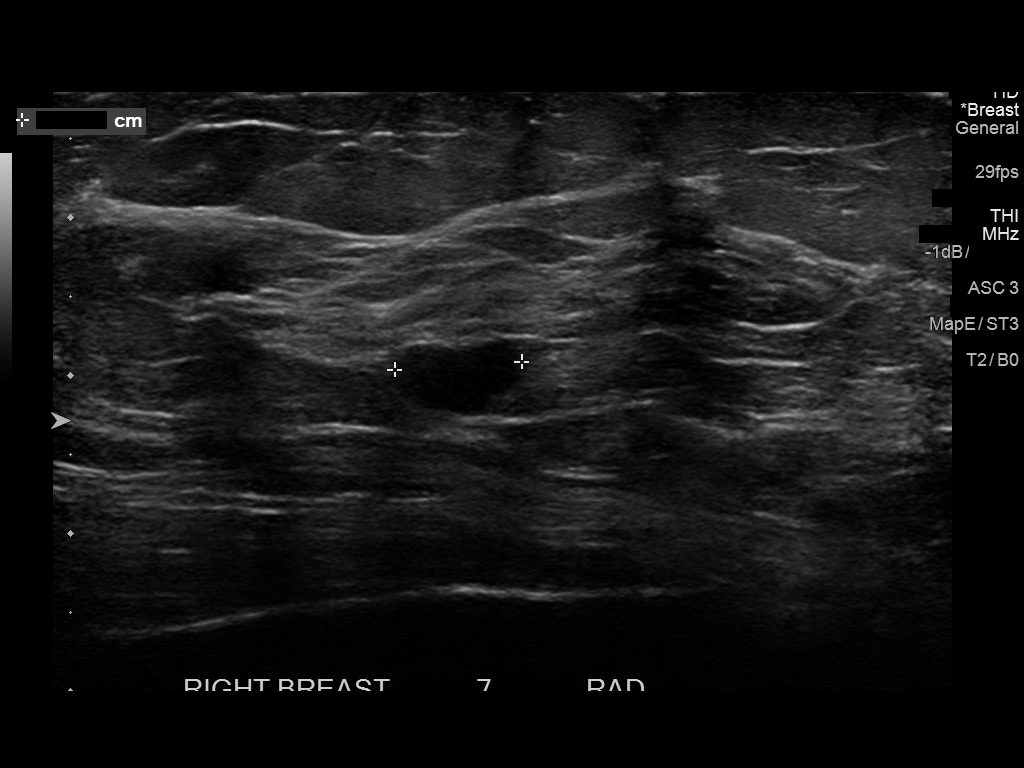
[im 6/7]
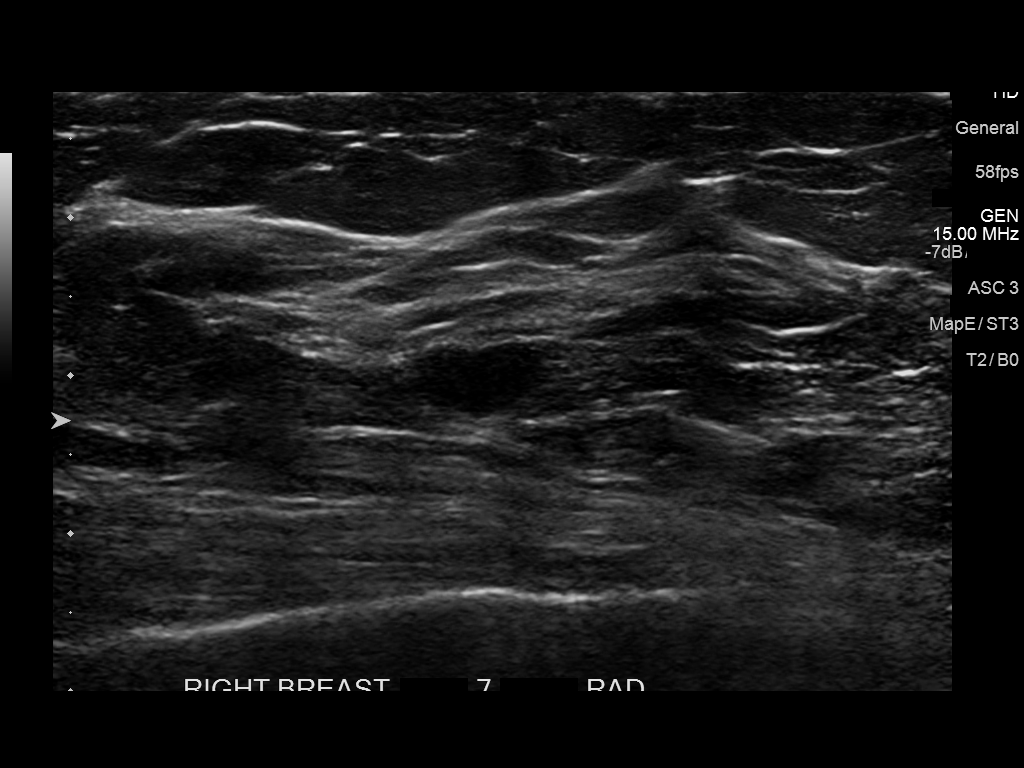
[im 7/7]
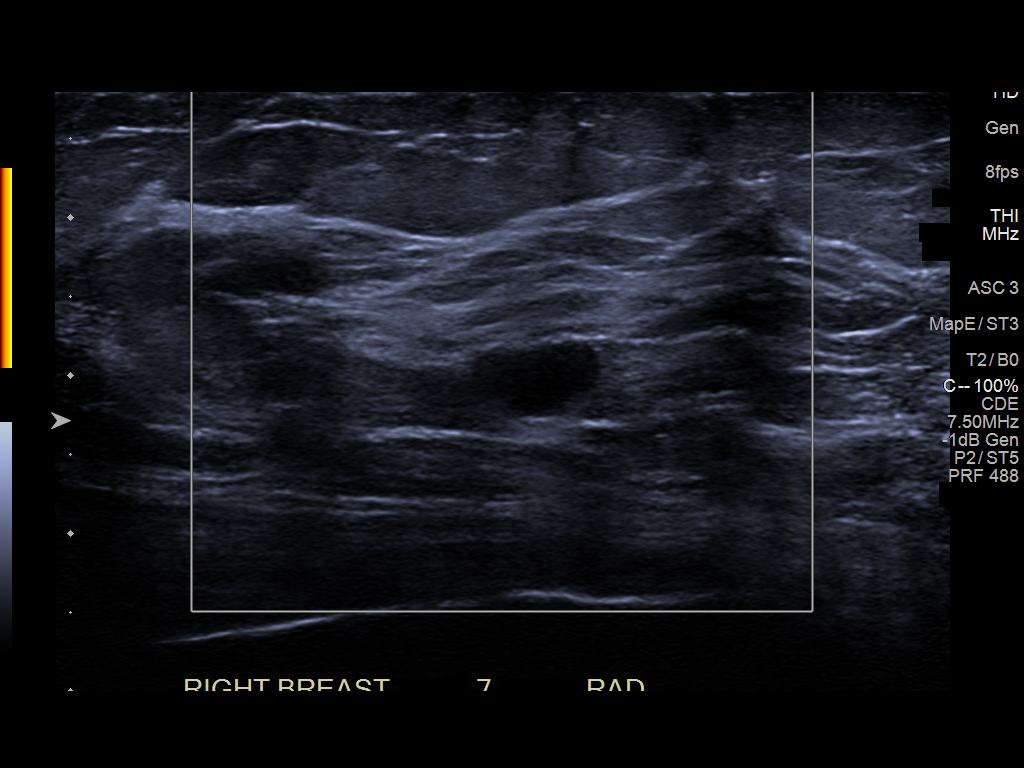

[7 of 7 positions shown; findings below may reference images not displayed]

FINDINGS: On physical exam, no mass is palpable in the upper outer right
breast.

Targeted ultrasound is performed, showing an 8 x 7 x 5 mm oval,
horizontally oriented, hypoechoic mass in the 10 o'clock position of
the right breast, 7 cm from the nipple. This exhibits increased
through transmission of sound with no internal blood flow seen with
color Doppler. This measured 8 x 8 x 5 mm on 09/28/2016.
IMPRESSION: No significant change in an 8 mm probably benign complicated cyst in
the 10 o'clock position of the right breast.

RECOMMENDATION:
Bilateral diagnostic mammogram and right breast ultrasound in 6
months.

I have discussed the findings and recommendations with the patient.
Results were also provided in writing at the conclusion of the
visit. If applicable, a reminder letter will be sent to the patient
regarding the next appointment.

BI-RADS CATEGORY  3: Probably benign.

## 2018-10-21 ENCOUNTER — Other Ambulatory Visit: Payer: Self-pay | Admitting: Nurse Practitioner

## 2018-10-21 ENCOUNTER — Other Ambulatory Visit: Payer: Self-pay | Admitting: Unknown Physician Specialty

## 2018-10-21 NOTE — Telephone Encounter (Signed)
Requested medication (s) are due for refill today: Yes  Requested medication (s) are on the active medication list: Yes  Last refill:  09/12/18  Future visit scheduled: Yes  Notes to clinic:  See request    Requested Prescriptions  Pending Prescriptions Disp Refills   cyclobenzaprine (FLEXERIL) 10 MG tablet [Pharmacy Med Name: CYCLOBENZAPRINE HYDROCHLORIDE 10 MG Tablet] 30 tablet 0    Sig: TAKE 1 TABLET AT BEDTIME     Not Delegated - Analgesics:  Muscle Relaxants Failed - 10/21/2018  1:46 PM      Failed - This refill cannot be delegated      Passed - Valid encounter within last 6 months    Recent Outpatient Visits          5 months ago Annual physical exam   [place], Henrine Screws T, NP   8 months ago Essential hypertension   [place], Adriana M, PA-C   11 months ago IFG (impaired fasting glucose)   [facility] Kathrine Haddock, NP   1 year ago Essential hypertension   [place] Kathrine Haddock, NP   1 year ago IFG (impaired fasting glucose)   [facility] Kathrine Haddock, NP      Future Appointments            In 1 week Cannady, Barbaraann Faster, NP [facility]   In 5 months  [facility]         Signed Prescriptions Disp Refills   allopurinol (ZYLOPRIM) 300 MG tablet 90 tablet 0    Sig: TAKE 1 TABLET EVERY DAY     Endocrinology:  Gout Agents Failed - 10/21/2018  1:46 PM      Failed - Uric Acid in normal range and within 360 days    Uric Acid  Date Value Ref Range Status  04/25/2015 5.4 2.5 - 7.1 mg/dL Final    Comment:               Therapeutic target for gout patients: <6.0         Failed - Cr in normal range and within 360 days    Creatinine, Ser  Date Value Ref Range Status  05/05/2018 0.56 (L) 0.57 - 1.00 mg/dL Final         Passed - Valid encounter within last 12 months    Recent Outpatient Visits          5 months ago Annual physical exam   [place], Henrine Screws T, NP   8 months ago Essential hypertension   [place], PA-C   11 months ago IFG (impaired fasting glucose)   [facility] Kathrine Haddock, NP   1 year ago Essential hypertension   [place] Kathrine Haddock, NP   1 year ago IFG (impaired fasting glucose)   [facility] Kathrine Haddock, NP      Future Appointments            In 1 week Cannady, Barbaraann Faster, NP [facility]   In 5 months  [facility], [place]

## 2018-11-03 ENCOUNTER — Ambulatory Visit: Payer: Medicare Other | Admitting: Nurse Practitioner

## 2018-11-18 ENCOUNTER — Ambulatory Visit
Admission: RE | Admit: 2018-11-18 | Discharge: 2018-11-18 | Disposition: A | Discharge status: Home / Self Care | Payer: Medicare Other | Source: Ambulatory Visit | Attending: Nurse Practitioner | Admitting: Nurse Practitioner

## 2018-11-18 ENCOUNTER — Other Ambulatory Visit: Payer: Self-pay

## 2018-11-18 ENCOUNTER — Other Ambulatory Visit: Payer: Self-pay | Admitting: Nurse Practitioner

## 2018-11-18 DIAGNOSIS — N6311 Unspecified lump in the right breast, upper outer quadrant: Secondary | ICD-10-CM | POA: Diagnosis not present

## 2018-11-18 DIAGNOSIS — N6310 Unspecified lump in the right breast, unspecified quadrant: Secondary | ICD-10-CM

## 2018-11-18 DIAGNOSIS — R928 Other abnormal and inconclusive findings on diagnostic imaging of breast: Secondary | ICD-10-CM | POA: Diagnosis not present

## 2018-11-20 ENCOUNTER — Ambulatory Visit: Payer: No Typology Code available for payment source

## 2018-11-20 ENCOUNTER — Other Ambulatory Visit: Payer: Self-pay

## 2018-11-20 ENCOUNTER — Ambulatory Visit
Admission: RE | Admit: 2018-11-20 | Discharge: 2018-11-20 | Disposition: A | Discharge status: Home / Self Care | Payer: Medicare Other | Source: Ambulatory Visit | Attending: Nurse Practitioner | Admitting: Nurse Practitioner

## 2018-11-20 DIAGNOSIS — N6001 Solitary cyst of right breast: Secondary | ICD-10-CM | POA: Diagnosis not present

## 2018-11-20 DIAGNOSIS — R928 Other abnormal and inconclusive findings on diagnostic imaging of breast: Secondary | ICD-10-CM

## 2018-11-20 DIAGNOSIS — N6310 Unspecified lump in the right breast, unspecified quadrant: Secondary | ICD-10-CM | POA: Insufficient documentation

## 2018-12-04 ENCOUNTER — Ambulatory Visit: Payer: Medicare Other | Admitting: Nurse Practitioner

## 2018-12-05 ENCOUNTER — Other Ambulatory Visit: Payer: Self-pay | Admitting: Nurse Practitioner

## 2018-12-05 NOTE — Telephone Encounter (Signed)
Please advise 

## 2018-12-08 ENCOUNTER — Other Ambulatory Visit: Payer: Self-pay

## 2018-12-08 ENCOUNTER — Ambulatory Visit (INDEPENDENT_AMBULATORY_CARE_PROVIDER_SITE_OTHER): Payer: Medicare Other | Admitting: Nurse Practitioner

## 2018-12-08 ENCOUNTER — Encounter: Payer: Self-pay | Admitting: Nurse Practitioner

## 2018-12-08 VITALS — BP 106/61

## 2018-12-08 DIAGNOSIS — E1140 Type 2 diabetes mellitus with diabetic neuropathy, unspecified: Secondary | ICD-10-CM

## 2018-12-08 DIAGNOSIS — I10 Essential (primary) hypertension: Secondary | ICD-10-CM

## 2018-12-08 DIAGNOSIS — E785 Hyperlipidemia, unspecified: Secondary | ICD-10-CM

## 2018-12-08 DIAGNOSIS — E7800 Pure hypercholesterolemia, unspecified: Secondary | ICD-10-CM

## 2018-12-08 NOTE — Assessment & Plan Note (Signed)
Chronic, ongoing.  Continues on Metformin. Last A1C 6.4%.  Consider reduction of Metformin if continues to decrease A1C level. Obtain outpatient labs.  Return in 3 months.

## 2018-12-08 NOTE — Assessment & Plan Note (Signed)
Chronic, stable with BP below goal.  Continue current medication regimen.  Obtain outpatient labs.

## 2018-12-08 NOTE — Assessment & Plan Note (Signed)
Chronic, stable.  Continue current medication regimen and adjust as needed.  Obtain outpatient labs.

## 2018-12-08 NOTE — Progress Notes (Signed)
BP 106/61 Comment: pt reported  LMP  (LMP Unknown)    Subjective:    [name], female    DOB: December 09, 1945, 73 y.o.   MRN: [id number]  HPI: Catherine Hinton is a 74 y.o. female  Chief Complaint  Patient presents with  . Diabetes  . Hyperlipidemia  . Hypertension    . This visit was completed via telephone due to the restrictions of the COVID-19 pandemic. All issues as above were discussed and addressed but no physical exam was performed. If it was felt that the patient should be evaluated in the office, they were directed there. The patient verbally consented to this visit. Patient was unable to complete an audio/visual visit due to Lack of equipment. Due to the catastrophic nature of the COVID-19 pandemic, this visit was done through audio contact only. . Location of the patient: home . Location of the provider: home . Those involved with this call:  . Provider: Marnee Guarneri, DNP . CMA: Yvonna Alanis, CMA . Front Desk/Registration: Jill Side  . Time spent on call: 15 minutes on the phone discussing health concerns. 5 minutes total spent in review of patient's record and preparation of their chart. I verified patient identity using two factors (patient name and date of birth). Patient consents verbally to being seen via telemedicine visit today.   HYPERTENSION / HYPERLIPIDEMIA Continues on Metoprolol, Valsartan-HCTZ, Gemifibrozil, and ASA. Satisfied with current treatment? yes Duration of hypertension: chronic BP monitoring frequency: a few times a week BP range:  BP medication side effects: no Duration of hyperlipidemia: chronic Cholesterol medication side effects: no Cholesterol supplements: none Medication compliance: good compliance Aspirin: yes Recent stressors: no Recurrent headaches: no Visual changes: no Palpitations: no Dyspnea: no Chest pain: no Lower extremity edema: no Dizzy/lightheaded: no   DIABETES Last A1C was 6.4% in  November, she missed a 3 month visit.  Continues on Metformin 500 MG twice daily. Hypoglycemic episodes:no Polydipsia/polyuria: no Visual disturbance: no Chest pain: no Paresthesias: no Glucose Monitoring: yes  Accucheck frequency: Daily  Fasting glucose: 138 this morning, often average 130-135 in morning  Post prandial:  Evening:  Before meals: Taking Insulin?: no  Long acting insulin:  Short acting insulin: Blood Pressure Monitoring: a few times a week Retinal Examination: Not up to Date Foot Exam: Not up to Date Pneumovax: Up to Date Influenza: Up to Date Aspirin: yes  Relevant past medical, surgical, family and social history reviewed and updated as indicated. Interim medical history since our last visit reviewed. Allergies and medications reviewed and updated.  Review of Systems  Constitutional: Negative for activity change, appetite change, diaphoresis, fatigue and fever.  Respiratory: Negative for cough, chest tightness and shortness of breath.   Cardiovascular: Negative for chest pain, palpitations and leg swelling.  Gastrointestinal: Negative for abdominal distention, abdominal pain, constipation, diarrhea, nausea and vomiting.  Endocrine: Negative for cold intolerance, heat intolerance, polydipsia, polyphagia and polyuria.  Neurological: Negative for dizziness, syncope, weakness, light-headedness, numbness and headaches.  Psychiatric/Behavioral: Negative.     Per HPI unless specifically indicated above     Objective:    BP 106/61 Comment: pt reported  LMP  (LMP Unknown)   Wt Readings from Last 3 Encounters:  05/05/18 220 lb (99.8 kg)  04/10/18 220 lb 4.8 oz (99.9 kg)  02/18/18 218 lb 3.2 oz (99 kg)    Physical Exam   No physical exam due to telephone visit only.  Results for orders placed or performed in visit on 05/05/18  CBC with Differential/Platelet  Result Value Ref Range   WBC 5.5 3.4 - 10.8 x10E3/uL   RBC 4.17 3.77 - 5.28 x10E6/uL   Hemoglobin  12.3 11.1 - 15.9 g/dL   Hematocrit 36.1 34.0 - 46.6 %   MCV 87 79 - 97 fL   MCH 29.5 26.6 - 33.0 pg   MCHC 34.1 31.5 - 35.7 g/dL   RDW 13.0 12.3 - 15.4 %   Platelets 270 150 - 450 x10E3/uL   Neutrophils 58 Not Estab. %   Lymphs 33 Not Estab. %   Monocytes 6 Not Estab. %   Eos 2 Not Estab. %   Basos 1 Not Estab. %   Neutrophils Absolute 3.2 1.4 - 7.0 x10E3/uL   Lymphocytes Absolute 1.8 0.7 - 3.1 x10E3/uL   Monocytes Absolute 0.4 0.1 - 0.9 x10E3/uL   EOS (ABSOLUTE) 0.1 0.0 - 0.4 x10E3/uL   Basophils Absolute 0.0 0.0 - 0.2 x10E3/uL   Immature Granulocytes 0 Not Estab. %   Immature Grans (Abs) 0.0 0.0 - 0.1 x10E3/uL  Comprehensive metabolic panel  Result Value Ref Range   Glucose 180 (H) 65 - 99 mg/dL   BUN 10 8 - 27 mg/dL   Creatinine, Ser 0.56 (L) 0.57 - 1.00 mg/dL   GFR calc non Af Amer 95 >59 mL/min/1.73   GFR calc Af Amer 109 >59 mL/min/1.73   BUN/Creatinine Ratio 18 12 - 28   Sodium 141 134 - 144 mmol/L   Potassium 3.7 3.5 - 5.2 mmol/L   Chloride 98 96 - 106 mmol/L   CO2 24 20 - 29 mmol/L   Calcium 9.3 8.7 - 10.3 mg/dL   Total Protein 6.8 6.0 - 8.5 g/dL   Albumin 4.3 3.5 - 4.8 g/dL   Globulin, Total 2.5 1.5 - 4.5 g/dL   Albumin/Globulin Ratio 1.7 1.2 - 2.2   Bilirubin Total 0.3 0.0 - 1.2 mg/dL   Alkaline Phosphatase 48 39 - 117 IU/L   AST 21 0 - 40 IU/L   ALT 17 0 - 32 IU/L  Lipid Panel w/o Chol/HDL Ratio  Result Value Ref Range   Cholesterol, Total 136 100 - 199 mg/dL   Triglycerides 139 0 - 149 mg/dL   HDL 36 (L) >39 mg/dL   VLDL Cholesterol Cal 28 5 - 40 mg/dL   LDL Calculated 72 0 - 99 mg/dL  TSH  Result Value Ref Range   TSH 1.050 0.450 - 4.500 uIU/mL  VITAMIN D 25 Hydroxy (Vit-D Deficiency, Fractures)  Result Value Ref Range   Vit D, 25-Hydroxy 37.9 30.0 - 100.0 ng/mL  Bayer DCA Hb A1c Waived (STAT)  Result Value Ref Range   HB A1C (BAYER DCA - WAIVED) 6.4 <7.0 %  B12  Result Value Ref Range   Vitamin B-12 534 232 - 1,245 pg/mL      Assessment &  Plan:   Problem List Items Addressed This Visit      Cardiovascular and Mediastinum   Hypertension    Chronic, stable with BP below goal.  Continue current medication regimen.  Obtain outpatient labs.      Relevant Orders   Comprehensive metabolic panel     Nervous and Auditory   Type 2 diabetes mellitus with diabetic neuropathy, unspecified ([place]) - Primary    Chronic, ongoing.  Continues on Metformin. Last A1C 6.4%.  Consider reduction of Metformin if continues to decrease A1C level. Obtain outpatient labs.  Return in 3 months.      Relevant Orders   Bayer  DCA Hb A1c Waived   Comprehensive metabolic panel   Microalbumin, Urine Waived     Other   Hyperlipemia    Chronic, stable.  Continue current medication regimen and adjust as needed.  Obtain outpatient labs.        Relevant Orders   Comprehensive metabolic panel   Lipid Panel [place], Waived      I discussed the assessment and treatment plan with the patient. The patient was provided an opportunity to ask questions and all were answered. The patient agreed with the plan and demonstrated an understanding of the instructions.   The patient was advised to call back or seek an in-person evaluation if the symptoms worsen or if the condition fails to improve as anticipated.   I provided 15 minutes of time during this encounter.  Follow up plan: Return in about 3 months (around 03/10/2019) for T2DM, HTN/HLD, neuropathy.

## 2018-12-08 NOTE — Patient Instructions (Signed)
Carbohydrate Counting for Diabetes Mellitus, Adult  Carbohydrate counting is a method of keeping track of how many carbohydrates you eat. Eating carbohydrates naturally increases the amount of sugar (glucose) in the blood. Counting how many carbohydrates you eat helps keep your blood glucose within normal limits, which helps you manage your diabetes (diabetes mellitus). It is important to know how many carbohydrates you can safely have in each meal. This is different for every person. A diet and nutrition specialist (registered dietitian) can help you make a meal plan and calculate how many carbohydrates you should have at each meal and snack. Carbohydrates are found in the following foods:  Grains, such as breads and cereals.  Dried beans and soy products.  Starchy vegetables, such as potatoes, peas, and corn.  Fruit and fruit juices.  Milk and yogurt.  Sweets and snack foods, such as cake, cookies, candy, chips, and soft drinks. How do I count carbohydrates? There are two ways to count carbohydrates in food. You can use either of the methods or a combination of both. Reading "Nutrition Facts" on packaged food The "Nutrition Facts" list is included on the labels of almost all packaged foods and beverages in the [place] It includes:  The serving size.  Information about nutrients in each serving, including the grams (g) of carbohydrate per serving. To use the "Nutrition Facts":  Decide how many servings you will have.  Multiply the number of servings by the number of carbohydrates per serving.  The resulting number is the total amount of carbohydrates that you will be having. Learning standard serving sizes of other foods When you eat carbohydrate foods that are not packaged or do not include "Nutrition Facts" on the label, you need to measure the servings in order to count the amount of carbohydrates:  Measure the foods that you will eat with a food scale or measuring cup, if needed.   Decide how many standard-size servings you will eat.  Multiply the number of servings by 15. Most carbohydrate-rich foods have about 15 g of carbohydrates per serving. ? For example, if you eat 8 oz (170 g) of strawberries, you will have eaten 2 servings and 30 g of carbohydrates (2 servings x 15 g = 30 g).  For foods that have more than one food mixed, such as soups and casseroles, you must count the carbohydrates in each food that is included. The following list contains standard serving sizes of common carbohydrate-rich foods. Each of these servings has about 15 g of carbohydrates:   hamburger bun or  English muffin.   oz (15 mL) syrup.   oz (14 g) jelly.  1 slice of bread.  1 six-inch tortilla.  3 oz (85 g) cooked rice or pasta.  4 oz (113 g) cooked dried beans.  4 oz (113 g) starchy vegetable, such as peas, corn, or potatoes.  4 oz (113 g) hot cereal.  4 oz (113 g) mashed potatoes or  of a large baked potato.  4 oz (113 g) canned or frozen fruit.  4 oz (120 mL) fruit juice.  4-6 crackers.  6 chicken nuggets.  6 oz (170 g) unsweetened dry cereal.  6 oz (170 g) plain fat-free yogurt or yogurt sweetened with artificial sweeteners.  8 oz (240 mL) milk.  8 oz (170 g) fresh fruit or one small piece of fruit.  24 oz (680 g) popped popcorn. Example of carbohydrate counting Sample meal  3 oz (85 g) chicken breast.  6 oz (170 g)   brown rice.  4 oz (113 g) corn.  8 oz (240 mL) milk.  8 oz (170 g) strawberries with sugar-free whipped topping. Carbohydrate calculation 1. Identify the foods that contain carbohydrates: ? Rice. ? Corn. ? Milk. ? Strawberries. 2. Calculate how many servings you have of each food: ? 2 servings rice. ? 1 serving corn. ? 1 serving milk. ? 1 serving strawberries. 3. Multiply each number of servings by 15 g: ? 2 servings rice x 15 g = 30 g. ? 1 serving corn x 15 g = 15 g. ? 1 serving milk x 15 g = 15 g. ? 1 serving  strawberries x 15 g = 15 g. 4. Add together all of the amounts to find the total grams of carbohydrates eaten: ? 30 g + 15 g + 15 g + 15 g = 75 g of carbohydrates total. Summary  Carbohydrate counting is a method of keeping track of how many carbohydrates you eat.  Eating carbohydrates naturally increases the amount of sugar (glucose) in the blood.  Counting how many carbohydrates you eat helps keep your blood glucose within normal limits, which helps you manage your diabetes.  A diet and nutrition specialist (registered dietitian) can help you make a meal plan and calculate how many carbohydrates you should have at each meal and snack. This information is not intended to replace advice given to you by your health care provider. Make sure you discuss any questions you have with your health care provider. Document Released: 06/04/2005 Document Revised: 12/12/2016 Document Reviewed: 11/16/2015 Elsevier Interactive Patient Education  2019 Elsevier Inc.  

## 2018-12-09 ENCOUNTER — Other Ambulatory Visit: Payer: Medicare Other

## 2018-12-09 ENCOUNTER — Other Ambulatory Visit: Payer: Self-pay

## 2018-12-09 DIAGNOSIS — E7800 Pure hypercholesterolemia, unspecified: Secondary | ICD-10-CM | POA: Diagnosis not present

## 2018-12-09 DIAGNOSIS — E1140 Type 2 diabetes mellitus with diabetic neuropathy, unspecified: Secondary | ICD-10-CM | POA: Diagnosis not present

## 2018-12-09 DIAGNOSIS — I10 Essential (primary) hypertension: Secondary | ICD-10-CM

## 2018-12-09 LAB — MICROALBUMIN, URINE WAIVED
Creatinine, Urine Waived: 200 mg/dL (ref 10–300)
Microalb, Ur Waived: 80 mg/L — ABNORMAL HIGH (ref 0–19)

## 2018-12-09 LAB — LIPID PANEL PICCOLO, WAIVED
Chol/HDL Ratio Piccolo,Waive: 2.3 mg/dL
Cholesterol Piccolo, Waived: 225 mg/dL — ABNORMAL HIGH (ref <200)
HDL Chol Piccolo, Waived: 98 mg/dL (ref >59)
LDL Chol Calc Piccolo Waived: 104 mg/dL — ABNORMAL HIGH (ref <100)
Triglycerides Piccolo,Waived: 115 mg/dL (ref <150)
VLDL Chol Calc Piccolo,Waive: 23 mg/dL (ref <30)

## 2018-12-09 LAB — BAYER DCA HB A1C WAIVED: HB A1C (BAYER DCA - WAIVED): 7.2 % — ABNORMAL HIGH (ref <7.0)

## 2018-12-10 LAB — COMPREHENSIVE METABOLIC PANEL
ALT: 16 IU/L (ref 0–32)
AST: 19 IU/L (ref 0–40)
Albumin/Globulin Ratio: 1.9 (ref 1.2–2.2)
Albumin: 4.4 g/dL (ref 3.8–4.8)
Alkaline Phosphatase: 48 IU/L (ref 39–117)
BUN/Creatinine Ratio: 16 (ref 12–28)
BUN: 8 mg/dL (ref 8–27)
Bilirubin Total: 0.6 mg/dL (ref 0.0–1.2)
CO2: 24 mmol/L (ref 20–29)
Calcium: 9.3 mg/dL (ref 8.7–10.3)
Chloride: 98 mmol/L (ref 96–106)
Creatinine, Ser: 0.51 mg/dL — ABNORMAL LOW (ref 0.57–1.00)
GFR calc Af Amer: 113 mL/min/{1.73_m2} (ref >59)
GFR calc non Af Amer: 98 mL/min/{1.73_m2} (ref >59)
Globulin, Total: 2.3 g/dL (ref 1.5–4.5)
Glucose: 153 mg/dL — ABNORMAL HIGH (ref 65–99)
Potassium: 3.7 mmol/L (ref 3.5–5.2)
Sodium: 140 mmol/L (ref 134–144)
Total Protein: 6.7 g/dL (ref 6.0–8.5)

## 2019-01-17 ENCOUNTER — Other Ambulatory Visit: Payer: Self-pay | Admitting: Nurse Practitioner

## 2019-01-19 NOTE — Telephone Encounter (Signed)
Requested medications are due for refill today?  Yes  Requested medications are on the active medication list?  Yes  Last refill 12/05/2018  Future visit scheduled?  Yes - 03/08/2019  Notes to clinic   Requested Prescriptions  Pending Prescriptions Disp Refills   cyclobenzaprine (FLEXERIL) 10 MG tablet [Pharmacy Med Name: CYCLOBENZAPRINE HYDROCHLORIDE 10 MG Tablet] 30 tablet     Sig: TAKE 1 TABLET AT BEDTIME     Not Delegated - Analgesics:  Muscle Relaxants Failed - 01/17/2019  7:17 PM      Failed - This refill cannot be delegated      Passed - Valid encounter within last 6 months    Recent Outpatient Visits          1 month ago Type 2 diabetes mellitus with diabetic neuropathy, without long-term current use of insulin ([place])   [place], Kamas T, NP   8 months ago Annual physical exam   [employer], [place] T, NP   11 months ago Essential hypertension   [place], Adriana M, PA-C   1 year ago IFG (impaired fasting glucose)   [facility] Kathrine Haddock, NP   1 year ago Essential hypertension   [facility] Kathrine Haddock, NP      Future Appointments            In 1 month Cann[place], Barbaraann Faster, NP [facility]   In 2 months  [facility]           Signed Prescriptions Disp Refills   allopurinol (ZYLOPRIM) 300 MG tablet 90 tablet 0    Sig: TAKE 1 TABLET EVERY DAY     Endocrinology:  Gout Agents Failed - 01/17/2019  7:17 PM      Failed - Uric Acid in normal range and within 360 days    Uric Acid  Date Value Ref Range Status  04/25/2015 5.4 2.5 - 7.1 mg/dL Final    Comment:               Therapeutic target for gout patients: <6.0         Failed - Cr in normal range and within 360 days    Creatinine, Ser  Date Value Ref Range Status  12/09/2018 0.51 (L) 0.57 - 1.00 mg/dL Final         Passed - Valid encounter within last 12 months    Recent  Outpatient Visits          1 month ago Type 2 diabetes mellitus with diabetic neuropathy, without long-term current use of insulin ([place])   [place], [place] T, NP   8 months ago Annual physical exam   [place], Henrine Screws T, NP   11 months ago Essential hypertension   [place], Adriana M, PA-C   1 year ago IFG (impaired fasting glucose)   [facility] Kathrine Haddock, NP   1 year ago Essential hypertension   [facility] Kathrine Haddock, NP      Future Appointments            In 1 month Cannady, Henrine Screws T, NP [facility]   In 2 months  [facility]            metoprolol succinate (TOPROL-XL) 25 MG 24 hr tablet 90 tablet 0    Sig: TAKE 1 TABLET EVERY DAY  Cardiovascular:  Beta Blockers Passed - 01/17/2019  7:17 PM      Passed - Last BP in normal range    BP Readings from Last 1 Encounters:  12/08/18 106/61         Passed - Last Heart Rate in normal range    Pulse Readings from Last 1 Encounters:  05/05/18 (!) 109         Passed - Valid encounter within last 6 months    Recent Outpatient Visits          1 month ago Type 2 diabetes mellitus with diabetic neuropathy, without long-term current use of insulin ([place])   [place], [place] T, NP   8 months ago Annual physical exam   [employer], [place] T, NP   11 months ago Essential hypertension   [place], Adriana M, PA-C   1 year ago IFG (impaired fasting glucose)   [facility] Kathrine Haddock, NP   1 year ago Essential hypertension   [place], [place], NP      Future Appointments            In 1 month Cannady, Barbaraann Faster, NP [facility]   In 2 months  [facility]            valsartan-hydrochlorothiazide (DIOVAN-HCT) 160-12.5 MG tablet 90 tablet 0    Sig:  TAKE 1 TABLET EVERY DAY     Cardiovascular: ARB + Diuretic Combos Failed - 01/17/2019  7:17 PM      Failed - Cr in normal range and within 180 days    Creatinine, Ser  Date Value Ref Range Status  12/09/2018 0.51 (L) 0.57 - 1.00 mg/dL Final         Passed - K in normal range and within 180 days    Potassium  Date Value Ref Range Status  12/09/2018 3.7 3.5 - 5.2 mmol/L Final         Passed - Na in normal range and within 180 days    Sodium  Date Value Ref Range Status  12/09/2018 140 134 - 144 mmol/L Final         Passed - Ca in normal range and within 180 days    Calcium  Date Value Ref Range Status  12/09/2018 9.3 8.7 - 10.3 mg/dL Final         Passed - Patient is not pregnant      Passed - Last BP in normal range    BP Readings from Last 1 Encounters:  12/08/18 106/61         Passed - Valid encounter within last 6 months    Recent Outpatient Visits          1 month ago Type 2 diabetes mellitus with diabetic neuropathy, without long-term current use of insulin ([place])   [place], [place] T, NP   8 months ago Annual physical exam   [place], Barbaraann Faster, NP   11 months ago Essential hypertension   [place], Adriana M, PA-C   1 year ago IFG (impaired fasting glucose)   [facility] Kathrine Haddock, NP   1 year ago Essential hypertension   [facility] Kathrine Haddock, NP      Future Appointments            In 1 month Cannady, Barbaraann Faster, NP [facility]  In 2 months  [place]            metFORMIN (GLUCOPHAGE) 500 MG tablet 180 tablet 0    Sig: Take 1 tablet (500 mg total) by mouth 2 (two) times a day.     Endocrinology:  Diabetes - Biguanides Failed - 01/17/2019  7:17 PM      Failed - Cr in normal range and within 360 days    Creatinine, Ser  Date Value Ref Range Status  12/09/2018 0.51 (L) 0.57 - 1.00 mg/dL Final         Passed -  HBA1C is between 0 and 7.9 and within 180 days    Hemoglobin A1C  Date Value Ref Range Status  11/06/2017 6.8  Final   HB A1C (BAYER DCA - WAIVED)  Date Value Ref Range Status  12/09/2018 7.2 (H) <7.0 % Final    Comment:                                          Diabetic Adult            <7.0                                       Healthy Adult        4.3 - 5.7                                                           (DCCT/NGSP) American Diabetes Association's Summary of Glycemic Recommendations for Adults with Diabetes: Hemoglobin A1c <7.0%. More stringent glycemic goals (A1c <6.0%) may further reduce complications at the cost of increased risk of hypoglycemia.          Passed - eGFR in normal range and within 360 days    GFR calc Af Amer  Date Value Ref Range Status  12/09/2018 113 >59 mL/min/1.73 Final   GFR calc non Af Amer  Date Value Ref Range Status  12/09/2018 98 >59 mL/min/1.73 Final         Passed - Valid encounter within last 6 months    Recent Outpatient Visits          1 month ago Type 2 diabetes mellitus with diabetic neuropathy, without long-term current use of insulin ([place])   [place], [place] T, NP   8 months ago Annual physical exam   [place], Barbaraann Faster, NP   11 months ago Essential hypertension   [place], Adriana M, PA-C   1 year ago IFG (impaired fasting glucose)   [facility] Kathrine Haddock, NP   1 year ago Essential hypertension   [place] Kathrine Haddock, NP      Future Appointments            In 1 month Cann[place], Barbaraann Faster, NP [facility]   In 2 months  [facility]

## 2019-01-19 NOTE — Telephone Encounter (Signed)
Requested Prescriptions  Pending Prescriptions Disp Refills  . allopurinol (ZYLOPRIM) 300 MG tablet [Pharmacy Med Name: ALLOPURINOL 300 MG Tablet] 90 tablet 0    Sig: TAKE 1 TABLET EVERY DAY     Endocrinology:  Gout Agents Failed - 01/17/2019  7:17 PM      Failed - Uric Acid in normal range and within 360 days    Uric Acid  Date Value Ref Range Status  04/25/2015 5.4 2.5 - 7.1 mg/dL Final    Comment:               Therapeutic target for gout patients: <6.0         Failed - Cr in normal range and within 360 days    Creatinine, Ser  Date Value Ref Range Status  12/09/2018 0.51 (L) 0.57 - 1.00 mg/dL Final         Passed - Valid encounter within last 12 months    Recent Outpatient Visits          1 month ago Type 2 diabetes mellitus with diabetic neuropathy, without long-term current use of insulin ([place])   [place], Barbaraann Faster, NP   8 months ago Annual physical exam   [place], Barbaraann Faster, NP   11 months ago Essential hypertension   [employer], Adriana M, PA-C   1 year ago IFG (impaired fasting glucose)   [facility] Kathrine Haddock, NP   1 year ago Essential hypertension   [facility] Kathrine Haddock, NP      Future Appointments            In 1 month Cannady, Barbaraann Faster, NP [employer]   In 2 months  [employer]           . metoprolol succinate (TOPROL-XL) 25 MG 24 hr tablet [Pharmacy Med Name: METOPROLOL SUCCINATE ER 25 MG Tablet Extended Release 24 Hour] 90 tablet 0    Sig: TAKE 1 TABLET EVERY DAY     Cardiovascular:  Beta Blockers Passed - 01/17/2019  7:17 PM      Passed - Last BP in normal range    BP Readings from Last 1 Encounters:  12/08/18 106/61         Passed - Last Heart Rate in normal range    Pulse Readings from Last 1 Encounters:  05/05/18 (!) 109         Passed - Valid encounter within last 6 months    Recent Outpatient Visits           1 month ago Type 2 diabetes mellitus with diabetic neuropathy, without long-term current use of insulin ([place])   [place], [place] T, NP   8 months ago Annual physical exam   [place], [place] T, NP   11 months ago Essential hypertension   [place], Adriana M, PA-C   1 year ago IFG (impaired fasting glucose)   [facility] Kathrine Haddock, NP   1 year ago Essential hypertension   [facility] Kathrine Haddock, NP      Future Appointments            In 1 month Cannady, Barbaraann Faster, NP [employer]   In 2 months  [employer] [facility]           . valsartan-hydrochlorothiazide (DIOVAN-HCT) 160-12.5 MG tablet [Pharmacy Med Name: VALSARTAN/HYDROCHLOROTHIAZIDE 160-12.5 MG  Tablet] 90 tablet 0    Sig: TAKE 1 TABLET EVERY DAY     Cardiovascular: ARB + Diuretic Combos Failed - 01/17/2019  7:17 PM      Failed - Cr in normal range and within 180 days    Creatinine, Ser  Date Value Ref Range Status  12/09/2018 0.51 (L) 0.57 - 1.00 mg/dL Final         Passed - K in normal range and within 180 days    Potassium  Date Value Ref Range Status  12/09/2018 3.7 3.5 - 5.2 mmol/L Final         Passed - Na in normal range and within 180 days    Sodium  Date Value Ref Range Status  12/09/2018 140 134 - 144 mmol/L Final         Passed - Ca in normal range and within 180 days    Calcium  Date Value Ref Range Status  12/09/2018 9.3 8.7 - 10.3 mg/dL Final         Passed - Patient is not pregnant      Passed - Last BP in normal range    BP Readings from Last 1 Encounters:  12/08/18 106/61         Passed - Valid encounter within last 6 months    Recent Outpatient Visits          1 month ago Type 2 diabetes mellitus with diabetic neuropathy, without long-term current use of insulin ([place])   [place], Barbaraann Faster, NP   8 months ago Annual physical  exam   [place], Barbaraann Faster, NP   11 months ago Essential hypertension   [employer], Adriana M, PA-C   1 year ago IFG (impaired fasting glucose)   [facility] [place] Kathrine Haddock, NP   1 year ago Essential hypertension   [facility] Kathrine Haddock, NP      Future Appointments            In 1 month Cannady, Henrine Screws T, NP [employer] [facility]   In 2 months  [facility] [facility]           . cyclobenzaprine (FLEXERIL) 10 MG tablet [Pharmacy Med Name: CYCLOBENZAPRINE HYDROCHLORIDE 10 MG Tablet] 30 tablet     Sig: TAKE 1 TABLET AT BEDTIME     Not Delegated - Analgesics:  Muscle Relaxants Failed - 01/17/2019  7:17 PM      Failed - This refill cannot be delegated      Passed - Valid encounter within last 6 months    Recent Outpatient Visits          1 month ago Type 2 diabetes mellitus with diabetic neuropathy, without long-term current use of insulin ([place])   [place], Triana T, NP   8 months ago Annual physical exam   [place], Barbaraann Faster, NP   11 months ago Essential hypertension   [place], Adriana M, PA-C   1 year ago IFG (impaired fasting glucose)   [facility] Kathrine Haddock, NP   1 year ago Essential hypertension   [facility] Kathrine Haddock, NP      Future Appointments            In 1 month Cannady, Barbaraann Faster, NP [employer] [facility]   In 2 months  [employer] [facility]           .  metFORMIN (GLUCOPHAGE) 500 MG tablet [Pharmacy Med Name: METFORMIN HYDROCHLORIDE 500 MG Tablet] 180 tablet 0    Sig: Take 1 tablet (500 mg total) by mouth 2 (two) times a day.     Endocrinology:  Diabetes - Biguanides Failed - 01/17/2019  7:17 PM      Failed - Cr in normal range and within 360 days    Creatinine, Ser  Date Value Ref Range Status  12/09/2018 0.51 (L) 0.57 - 1.00 mg/dL Final          Passed - HBA1C is between 0 and 7.9 and within 180 days    Hemoglobin A1C  Date Value Ref Range Status  11/06/2017 6.8  Final   HB A1C (BAYER DCA - WAIVED)  Date Value Ref Range Status  12/09/2018 7.2 (H) <7.0 % Final    Comment:                                          Diabetic Adult            <7.0                                       Healthy Adult        4.3 - 5.7                                                           (DCCT/NGSP) American Diabetes Association's Summary of Glycemic Recommendations for Adults with Diabetes: Hemoglobin A1c <7.0%. More stringent glycemic goals (A1c <6.0%) may further reduce complications at the cost of increased risk of hypoglycemia.          Passed - eGFR in normal range and within 360 days    GFR calc Af Amer  Date Value Ref Range Status  12/09/2018 113 >59 mL/min/1.73 Final   GFR calc non Af Amer  Date Value Ref Range Status  12/09/2018 98 >59 mL/min/1.73 Final         Passed - Valid encounter within last 6 months    Recent Outpatient Visits          1 month ago Type 2 diabetes mellitus with diabetic neuropathy, without long-term current use of insulin ([place])   [place], [place] T, NP   8 months ago Annual physical exam   [place], Barbaraann Faster, NP   11 months ago Essential hypertension   [place], Adriana M, PA-C   1 year ago IFG (impaired fasting glucose)   [facility] Kathrine Haddock, NP   1 year ago Essential hypertension   [place] Kathrine Haddock, NP      Future Appointments            In 1 month Cannady, Barbaraann Faster, NP [facility] [facility]   In 2 months  [facility]           Patient has follow up appointment 03/08/2019.

## 2019-02-02 DIAGNOSIS — H02886 Meibomian gland dysfunction of left eye, unspecified eyelid: Secondary | ICD-10-CM | POA: Diagnosis not present

## 2019-02-02 DIAGNOSIS — H401131 Primary open-angle glaucoma, bilateral, mild stage: Secondary | ICD-10-CM | POA: Diagnosis not present

## 2019-02-02 DIAGNOSIS — H02883 Meibomian gland dysfunction of right eye, unspecified eyelid: Secondary | ICD-10-CM | POA: Diagnosis not present

## 2019-02-02 DIAGNOSIS — H353130 Nonexudative age-related macular degeneration, bilateral, stage unspecified: Secondary | ICD-10-CM | POA: Diagnosis not present

## 2019-02-02 DIAGNOSIS — H04129 Dry eye syndrome of unspecified lacrimal gland: Secondary | ICD-10-CM | POA: Diagnosis not present

## 2019-02-02 DIAGNOSIS — H26492 Other secondary cataract, left eye: Secondary | ICD-10-CM | POA: Diagnosis not present

## 2019-02-02 DIAGNOSIS — E119 Type 2 diabetes mellitus without complications: Secondary | ICD-10-CM | POA: Diagnosis not present

## 2019-02-02 LAB — HM DIABETES EYE EXAM

## 2019-02-16 DIAGNOSIS — H04129 Dry eye syndrome of unspecified lacrimal gland: Secondary | ICD-10-CM | POA: Diagnosis not present

## 2019-02-16 DIAGNOSIS — H16229 Keratoconjunctivitis sicca, not specified as Sjogren's, unspecified eye: Secondary | ICD-10-CM | POA: Diagnosis not present

## 2019-03-02 DIAGNOSIS — H04129 Dry eye syndrome of unspecified lacrimal gland: Secondary | ICD-10-CM | POA: Diagnosis not present

## 2019-03-02 DIAGNOSIS — H04123 Dry eye syndrome of bilateral lacrimal glands: Secondary | ICD-10-CM | POA: Diagnosis not present

## 2019-03-09 ENCOUNTER — Ambulatory Visit (INDEPENDENT_AMBULATORY_CARE_PROVIDER_SITE_OTHER): Payer: Medicare Other | Admitting: Nurse Practitioner

## 2019-03-09 ENCOUNTER — Other Ambulatory Visit: Payer: Self-pay

## 2019-03-09 ENCOUNTER — Encounter: Payer: Self-pay | Admitting: Nurse Practitioner

## 2019-03-09 VITALS — BP 110/71 | HR 96 | Temp 99.0°F

## 2019-03-09 DIAGNOSIS — I10 Essential (primary) hypertension: Secondary | ICD-10-CM

## 2019-03-09 DIAGNOSIS — E1169 Type 2 diabetes mellitus with other specified complication: Secondary | ICD-10-CM | POA: Diagnosis not present

## 2019-03-09 DIAGNOSIS — R509 Fever, unspecified: Secondary | ICD-10-CM

## 2019-03-09 DIAGNOSIS — M25569 Pain in unspecified knee: Secondary | ICD-10-CM | POA: Insufficient documentation

## 2019-03-09 DIAGNOSIS — E785 Hyperlipidemia, unspecified: Secondary | ICD-10-CM

## 2019-03-09 DIAGNOSIS — M25562 Pain in left knee: Secondary | ICD-10-CM | POA: Diagnosis not present

## 2019-03-09 DIAGNOSIS — E1140 Type 2 diabetes mellitus with diabetic neuropathy, unspecified: Secondary | ICD-10-CM | POA: Diagnosis not present

## 2019-03-09 DIAGNOSIS — R6889 Other general symptoms and signs: Secondary | ICD-10-CM | POA: Diagnosis not present

## 2019-03-09 DIAGNOSIS — Z20822 Contact with and (suspected) exposure to covid-19: Secondary | ICD-10-CM

## 2019-03-09 MED ORDER — MUPIROCIN 2 % EX OINT: 1.0000 "application " | TOPICAL_OINTMENT | Freq: Two times a day (BID) | CUTANEOUS | 0 refills | Status: DC

## 2019-03-09 MED ORDER — DOXYCYCLINE HYCLATE 100 MG PO TABS: 100.0000 mg | ORAL_TABLET | Freq: Two times a day (BID) | ORAL | 0 refills | Status: DC

## 2019-03-09 NOTE — Assessment & Plan Note (Signed)
Chronic, stable.  Continue current medication regimen and adjust as needed.  Obtain outpatient labs.   

## 2019-03-09 NOTE — Assessment & Plan Note (Signed)
No current URI or UTI symptoms, she has been out at eye doctor and hairdresser over past week.  Due to current Covid pandemic will obtain Covid testing.  Have recommend she self quarantine until results return.  If negative and no further refer for at least 3 days may return to daily activities, but if positive must maintain quarantine for at least 14 days.  She was able to verbalize this back to provider and reports understanding.

## 2019-03-09 NOTE — Patient Instructions (Signed)

## 2019-03-09 NOTE — Assessment & Plan Note (Signed)
Acute after fall, full ROM present. X 2 small abrasions with erythema and mild swelling around exterior.  Script for Doxycycline and Mupirocin ointment sent.  Recommend to monitor area daily and notify provider if worsening.  If ongoing pain will consider imaging, at this time she wishes to defer this as pain is improving. Return for worsening or continued issues.

## 2019-03-09 NOTE — Assessment & Plan Note (Signed)
Chronic, ongoing.  Continues on Metformin. Last A1C 7.2%.  Consider increase of Metformin if continues to have elevation in A1C level above goal. Obtain outpatient labs.  Return in 3 months.

## 2019-03-09 NOTE — Assessment & Plan Note (Addendum)
Chronic, stable with BP below goal.  Continue current medication regimen and adjust as needed based on BP readings.  Obtain outpatient labs.

## 2019-03-09 NOTE — Progress Notes (Signed)
BP 110/71   Pulse 96 Comment: apical  Temp 99 F (37.2 C) (Oral)   LMP  (LMP Unknown)   SpO2 97%    Subjective:    [name], female    DOB: [birth year], 73 y.o.   MRN: [id number]  HPI: Catherine Hinton is a 73 y.o. female  Chief Complaint  Patient presents with  . Diabetes  . Hyperlipidemia  . Hypertension  . Leg Injury    pt states she fell last Monday and has been having pain in left leg since then    DIABETES Last A1C was 7.2% in June.  Continues on Metformin 500 MG twice daily. Hypoglycemic episodes:no Polydipsia/polyuria: no Visual disturbance: no Chest pain: no Paresthesias: no Glucose Monitoring: yes  Accucheck frequency: Daily  Fasting glucose: 155 this morning, on average have been in 130 to 160 range  Post prandial:  Evening:  Before meals: Taking Insulin?: no  Long acting insulin:  Short acting insulin: Blood Pressure Monitoring: daily Retinal Examination: Up to Date Foot Exam: Up to Date Pneumovax: Up to Date Influenza: Up to Date Aspirin: yes   HYPERTENSION / HYPERLIPIDEMIA Continues on Metoprolol and Valsartan-HCTZ, ASA and Lopid. Satisfied with current treatment? yes Duration of hypertension: chronic BP monitoring frequency: daily BP range: 110-120/70 at home BP medication side effects: no Duration of hyperlipidemia: chronic Cholesterol medication side effects: no Cholesterol supplements: none Medication compliance: good compliance Aspirin: yes Recent stressors: no Recurrent headaches: no Visual changes: no Palpitations: no Dyspnea: no Chest pain: no Lower extremity edema: no Dizzy/lightheaded: no   LEG PAIN (LEFT) Was coming out of eye doctor last Monday and step up to sidewalk and tripped.  When she fell she landed on left knee and left elbow.  Had abrasions which are now improving.  States ongoing mild discomfort to left knee, especially noted when has to sit down low on toilet.  Does not notice pain when walks,  only if going downstairs notices mild discomfort.  Reports that pain is overall improving, but wanted to alert provider. Duration: days Location: anterior knee, upper aspect Mechanism of injury: trauma Onset: gradual Severity: 3/10  Quality:  dull and aching Frequency: intermittent Radiation: no Aggravating factors: stairs and bending  Alleviating factors: ice, APAP and rest  Status: stable Treatments attempted: rest, ice and APAP  Relief with NSAIDs?:  moderate Swelling: yes Redness: no  Warmth: no Trauma: yes Chest pain: no  Shortness of breath: no  Fever: no Decreased sensation: no Paresthesias: no Weakness: no   FEVER: Has only been to eye doctor and to get haircut recently (over past 7 days), other than that has not been out at stores and has been having delivery only.  Did not realize she had fever.  Denies exposure to Covid + person or recent travel overseas.  Denies any urinary symptoms.  Elevation in temp noted at visit today fluctuating from 99 to 100.6.  Denies any URI symptoms.  Reports she has been feeling good and "had I known I had a fever I would not have come in today". Fever: yes Cough: no Shortness of breath: no Wheezing: no Chest pain: no Chest tightness: no Chest congestion: no Nasal congestion: no Runny nose: no Post nasal drip: no Sneezing: no Sore throat: no Swollen glands: no Sinus pressure: no Headache: no Face pain: no Toothache: no Ear pain: none Ear pressure: none Eyes red/itching:no Eye drainage/crusting: no  Vomiting: no Rash: no Fatigue: no Sick contacts: no  Relevant past medical, surgical,  family and social history reviewed and updated as indicated. Interim medical history since our last visit reviewed. Allergies and medications reviewed and updated.  Review of Systems  Constitutional: Positive for fever. Negative for activity change, appetite change, diaphoresis and fatigue.  Respiratory: Negative for cough, chest tightness,  shortness of breath and wheezing.   Cardiovascular: Negative for chest pain, palpitations and leg swelling.  Gastrointestinal: Negative for abdominal distention, abdominal pain, constipation, diarrhea, nausea and vomiting.  Endocrine: Negative for cold intolerance, heat intolerance, polydipsia, polyphagia and polyuria.  Genitourinary: Negative.   Skin: Positive for wound.  Neurological: Negative for dizziness, syncope, weakness, light-headedness, numbness and headaches.  Psychiatric/Behavioral: Negative.     Per HPI unless specifically indicated above     Objective:    BP 110/71   Pulse 96 Comment: apical  Temp 99 F (37.2 C) (Oral)   LMP  (LMP Unknown)   SpO2 97%   Wt Readings from Last 3 Encounters:  05/05/18 220 lb (99.8 kg)  04/10/18 220 lb 4.8 oz (99.9 kg)  02/18/18 218 lb 3.2 oz (99 kg)    Physical Exam Vitals signs and nursing note reviewed.  Constitutional:      General: She is awake. She is not in acute distress.    Appearance: She is well-developed. She is obese. She is not ill-appearing.  HENT:     Head: Normocephalic.     Right Ear: Hearing, tympanic membrane and ear canal normal. No drainage.     Left Ear: Hearing, tympanic membrane, ear canal and external ear normal. No drainage.     Nose: Nose normal.     Mouth/Throat:     Mouth: Mucous membranes are moist.     Pharynx: Oropharynx is clear.  Eyes:     General: Lids are normal.        Right eye: No discharge.        Left eye: No discharge.     Conjunctiva/sclera: Conjunctivae normal.     Pupils: Pupils are equal, round, and reactive to light.  Neck:     Musculoskeletal: Normal range of motion and neck supple.     Vascular: No carotid bruit.  Cardiovascular:     Rate and Rhythm: Normal rate and regular rhythm.     Heart sounds: Normal heart sounds. No murmur. No gallop.   Pulmonary:     Effort: Pulmonary effort is normal. No accessory muscle usage or respiratory distress.     Breath sounds: Normal  breath sounds.  Abdominal:     General: Bowel sounds are normal.     Palpations: Abdomen is soft. There is no hepatomegaly or splenomegaly.     Tenderness: There is no abdominal tenderness. There is no right CVA tenderness or left CVA tenderness.  Musculoskeletal:     Right knee: She exhibits normal range of motion, no swelling, no ecchymosis, no laceration and no erythema. No tenderness found.     Left knee: She exhibits laceration. She exhibits normal range of motion, no swelling and no ecchymosis. No tenderness found.     Right lower leg: No edema.     Left lower leg: No edema.     Comments: Full ROM without report discomfort or crepitus noted to left knee.  X 2 small, round abrasions noted to knee with mild erythema to exterior and edema to wound sites.  No warmth.  Wounds with serous drainage and initial crusting present.    Lymphadenopathy:     Head:     Right  side of head: No submental, submandibular, tonsillar, preauricular or posterior auricular adenopathy.     Left side of head: No submental, submandibular, tonsillar, preauricular or posterior auricular adenopathy.     Cervical: No cervical adenopathy.  Skin:    General: Skin is warm and dry.  Neurological:     Mental Status: She is alert and oriented to person, place, and time.  Psychiatric:        Attention and Perception: Attention normal.        Mood and Affect: Mood normal.        Behavior: Behavior normal. Behavior is cooperative.        Thought Content: Thought content normal.        Judgment: Judgment normal.     Results for orders placed or performed in visit on 02/02/19  HM DIABETES EYE EXAM  Result Value Ref Range   HM Diabetic Eye Exam No Retinopathy No Retinopathy      Assessment & Plan:   Problem List Items Addressed This Visit      Cardiovascular and Mediastinum   Hypertension    Chronic, stable with BP below goal.  Continue current medication regimen and adjust as needed based on BP readings.  Obtain  outpatient labs.      Relevant Orders   Comprehensive metabolic panel     Endocrine   Hyperlipidemia associated with type 2 diabetes mellitus (HCC)    Chronic, stable.  Continue current medication regimen and adjust as needed.  Obtain outpatient labs.        Relevant Orders   Lipid Panel w/o Chol/HDL Ratio   Comprehensive metabolic panel     Nervous and Auditory   Type 2 diabetes mellitus with diabetic neuropathy, unspecified ([place]) - Primary    Chronic, ongoing.  Continues on Metformin. Last A1C 7.2%.  Consider increase of Metformin if continues to have elevation in A1C level above goal. Obtain outpatient labs.  Return in 3 months.      Relevant Orders   Comprehensive metabolic panel   Bayer DCA Hb A1c Waived     Other   Knee pain, acute    Acute after fall, full ROM present. X 2 small abrasions with erythema and mild swelling around exterior.  Script for Doxycycline and Mupirocin ointment sent.  Recommend to monitor area daily and notify provider if worsening.  If ongoing pain will consider imaging, at this time she wishes to defer this as pain is improving. Return for worsening or continued issues.      Fever    No current URI or UTI symptoms, she has been out at eye doctor and hairdresser over past week.  Due to current Covid pandemic will obtain Covid testing.  Have recommend she self quarantine until results return.  If negative and no further refer for at least 3 days may return to daily activities, but if positive must maintain quarantine for at least 14 days.  She was able to verbalize this back to provider and reports understanding.          Follow up plan: Return in about 3 months (around 06/08/2019) for T2DM, HTN/HLD + outpatient lab visit need.

## 2019-03-10 ENCOUNTER — Other Ambulatory Visit: Payer: Self-pay | Admitting: Nurse Practitioner

## 2019-03-10 NOTE — Telephone Encounter (Signed)
Routing to provider  

## 2019-03-10 NOTE — Telephone Encounter (Signed)
Requested medication (s) are due for refill today: yes  Requested medication (s) are on the active medication list: yes   Last refill: This refill cannot be delegated   Future visit scheduled: no  Notes to clinic:  Refill cannot be delegated   Requested Prescriptions  Pending Prescriptions Disp Refills   cyclobenzaprine (FLEXERIL) 10 MG tablet [Pharmacy Med Name: CYCLOBENZAPRINE HYDROCHLORIDE 10 MG Tablet] 60 tablet     Sig: TAKE 1 TABLET AT BEDTIME     Not Delegated - Analgesics:  Muscle Relaxants Failed - 03/10/2019 10:16 AM      Failed - This refill cannot be delegated      Passed - Valid encounter within last 6 months    Recent Outpatient Visits          Yesterday Type 2 diabetes mellitus with diabetic neuropathy, without long-term current use of insulin ([place])   [place], [name] T, NP   3 months ago Type 2 diabetes mellitus with diabetic neuropathy, without long-term current use of insulin ([place])   [place], Barbaraann Faster, NP   10 months ago Annual physical exam   [place], Henrine Screws T, NP   1 year ago Essential hypertension   [place], Adriana M, PA-C   1 year ago IFG (impaired fasting glucose)   [facility] Kathrine Haddock, NP      Future Appointments            In 1 month [place]

## 2019-03-11 LAB — NOVEL CORONAVIRUS, NAA: SARS-CoV-2, NAA: NOT DETECTED

## 2019-03-24 ENCOUNTER — Other Ambulatory Visit: Payer: Self-pay

## 2019-03-24 ENCOUNTER — Other Ambulatory Visit: Payer: Medicare Other

## 2019-03-24 ENCOUNTER — Ambulatory Visit (INDEPENDENT_AMBULATORY_CARE_PROVIDER_SITE_OTHER): Payer: Medicare Other

## 2019-03-24 DIAGNOSIS — E785 Hyperlipidemia, unspecified: Secondary | ICD-10-CM

## 2019-03-24 DIAGNOSIS — E1140 Type 2 diabetes mellitus with diabetic neuropathy, unspecified: Secondary | ICD-10-CM

## 2019-03-24 DIAGNOSIS — Z23 Encounter for immunization: Secondary | ICD-10-CM

## 2019-03-24 DIAGNOSIS — I10 Essential (primary) hypertension: Secondary | ICD-10-CM

## 2019-03-24 DIAGNOSIS — E1169 Type 2 diabetes mellitus with other specified complication: Secondary | ICD-10-CM | POA: Diagnosis not present

## 2019-03-24 LAB — BAYER DCA HB A1C WAIVED: HB A1C (BAYER DCA - WAIVED): 6.6 % (ref <7.0)

## 2019-03-25 LAB — COMPREHENSIVE METABOLIC PANEL
ALT: 17 IU/L (ref 0–32)
AST: 21 IU/L (ref 0–40)
Albumin/Globulin Ratio: 2 (ref 1.2–2.2)
Albumin: 4.5 g/dL (ref 3.7–4.7)
Alkaline Phosphatase: 49 IU/L (ref 39–117)
BUN/Creatinine Ratio: 19 (ref 12–28)
BUN: 10 mg/dL (ref 8–27)
Bilirubin Total: 0.5 mg/dL (ref 0.0–1.2)
CO2: 26 mmol/L (ref 20–29)
Calcium: 9.3 mg/dL (ref 8.7–10.3)
Chloride: 99 mmol/L (ref 96–106)
Creatinine, Ser: 0.53 mg/dL — ABNORMAL LOW (ref 0.57–1.00)
GFR calc Af Amer: 110 mL/min/{1.73_m2} (ref >59)
GFR calc non Af Amer: 96 mL/min/{1.73_m2} (ref >59)
Globulin, Total: 2.2 g/dL (ref 1.5–4.5)
Glucose: 134 mg/dL — ABNORMAL HIGH (ref 65–99)
Potassium: 3.9 mmol/L (ref 3.5–5.2)
Sodium: 142 mmol/L (ref 134–144)
Total Protein: 6.7 g/dL (ref 6.0–8.5)

## 2019-03-25 LAB — LIPID PANEL W/O CHOL/HDL RATIO
Cholesterol, Total: 124 mg/dL (ref 100–199)
HDL: 34 mg/dL — ABNORMAL LOW (ref >39)
LDL Chol Calc (NIH): 65 mg/dL (ref 0–99)
Triglycerides: 139 mg/dL (ref 0–149)
VLDL Cholesterol Cal: 25 mg/dL (ref 5–40)

## 2019-03-29 ENCOUNTER — Other Ambulatory Visit: Payer: Self-pay | Admitting: Nurse Practitioner

## 2019-04-13 ENCOUNTER — Ambulatory Visit: Payer: Medicare Other

## 2019-05-12 ENCOUNTER — Other Ambulatory Visit: Payer: Self-pay | Admitting: Nurse Practitioner

## 2019-05-12 MED ORDER — TIZANIDINE HCL 2 MG PO TABS: 2.0000 mg | ORAL_TABLET | Freq: Four times a day (QID) | ORAL | 1 refills | Status: DC | PRN

## 2019-05-12 NOTE — Telephone Encounter (Signed)
Patient notified. Please refuse cyclobenazeprine so we can close the encounter

## 2019-05-12 NOTE — Telephone Encounter (Signed)
Please let patient know I am switching Flexeril to Tizanidine which is better muscle relaxer for her age group.  Will send fill on this now.

## 2019-05-12 NOTE — Telephone Encounter (Signed)
Requested medication (s) are due for refill today: yes  Requested medication (s) are on the active medication list: yes  Last refill:  03/12/2019  Future visit scheduled: yes  Notes to clinic:  Refill cannot be delegated    Requested Prescriptions  Pending Prescriptions Disp Refills   cyclobenzaprine (FLEXERIL) 10 MG tablet [Pharmacy Med Name: CYCLOBENZAPRINE HYDROCHLORIDE 10 MG Tablet] 60 tablet 0    Sig: TAKE 1 TABLET AT BEDTIME     Not Delegated - Analgesics:  Muscle Relaxants Failed - 05/12/2019  1:10 PM      Failed - This refill cannot be delegated      Passed - Valid encounter within last 6 months    Recent Outpatient Visits          2 months ago Type 2 diabetes mellitus with diabetic neuropathy, without long-term current use of insulin ([place])   [place], [name] T, NP   5 months ago Type 2 diabetes mellitus with diabetic neuropathy, without long-term current use of insulin ([place])   [place], [place] T, NP   1 year ago Annual physical exam   [place], Henrine Screws T, NP   1 year ago Essential hypertension   [place] [employer], Adriana M, PA-C   1 year ago IFG (impaired fasting glucose)   [facility] Kathrine Haddock, NP      Future Appointments            In 1 month Cann[place], Barbaraann Faster, NP [employer]           Signed Prescriptions Disp Refills   metFORMIN (GLUCOPHAGE) 500 MG tablet 180 tablet 0    Sig: TAKE 1 TABLET TWICE DAILY     Endocrinology:  Diabetes - Biguanides Failed - 05/12/2019  1:10 PM      Failed - Cr in normal range and within 360 days    Creatinine, Ser  Date Value Ref Range Status  03/24/2019 0.53 (L) 0.57 - 1.00 mg/dL Final         Passed - HBA1C is between 0 and 7.9 and within 180 days    Hemoglobin A1C  Date Value Ref Range Status  11/06/2017 6.8  Final   HB A1C (BAYER DCA - WAIVED)  Date Value Ref Range Status  03/24/2019 6.6  <7.0 % Final    Comment:                                          Diabetic Adult            <7.0                                       Healthy Adult        4.3 - 5.7                                                           (DCCT/NGSP) American Diabetes Association's Summary of Glycemic Recommendations for Adults with Diabetes: Hemoglobin A1c <7.0%. More stringent glycemic goals (A1c <6.0%) may further reduce complications at the  cost of increased risk of hypoglycemia.          Passed - eGFR in normal range and within 360 days    GFR calc Af Amer  Date Value Ref Range Status  03/24/2019 110 >59 mL/min/1.73 Final   GFR calc non Af Amer  Date Value Ref Range Status  03/24/2019 96 >59 mL/min/1.73 Final         Passed - Valid encounter within last 6 months    Recent Outpatient Visits          2 months ago Type 2 diabetes mellitus with diabetic neuropathy, without long-term current use of insulin ([place])   [place], [name] T, NP   5 months ago Type 2 diabetes mellitus with diabetic neuropathy, without long-term current use of insulin ([place])   [place], [place] T, NP   1 year ago Annual physical exam   [place], Henrine Screws T, NP   1 year ago Essential hypertension   [place], Adriana M, PA-C   1 year ago IFG (impaired fasting glucose)   [facility] Kathrine Haddock, NP      Future Appointments            In 1 month Cannady, Barbaraann Faster, NP [employer]            valsartan-hydrochlorothiazide (DIOVAN-HCT) 160-12.5 MG tablet 90 tablet 0    Sig: TAKE 1 TABLET EVERY DAY     Cardiovascular: ARB + Diuretic Combos Failed - 05/12/2019  1:10 PM      Failed - Cr in normal range and within 180 days    Creatinine, Ser  Date Value Ref Range Status  03/24/2019 0.53 (L) 0.57 - 1.00 mg/dL Final         Passed - K in normal range and within 180 days    Potassium  Date  Value Ref Range Status  03/24/2019 3.9 3.5 - 5.2 mmol/L Final         Passed - Na in normal range and within 180 days    Sodium  Date Value Ref Range Status  03/24/2019 142 134 - 144 mmol/L Final         Passed - Ca in normal range and within 180 days    Calcium  Date Value Ref Range Status  03/24/2019 9.3 8.7 - 10.3 mg/dL Final         Passed - Patient is not pregnant      Passed - Last BP in normal range    BP Readings from Last 1 Encounters:  03/09/19 110/71         Passed - Valid encounter within last 6 months    Recent Outpatient Visits          2 months ago Type 2 diabetes mellitus with diabetic neuropathy, without long-term current use of insulin ([place])   [place], [name] T, NP   5 months ago Type 2 diabetes mellitus with diabetic neuropathy, without long-term current use of insulin ([place])   [place], [place] T, NP   1 year ago Annual physical exam   [place], Henrine Screws T, NP   1 year ago Essential hypertension   [place], Adriana M, PA-C   1 year ago IFG (impaired fasting glucose)   [facility] Kathrine Haddock, NP      Future Appointments  In 1 month Cannady, Barbaraann Faster, NP [facility]            metoprolol succinate (TOPROL-XL) 25 MG 24 hr tablet 90 tablet 0    Sig: TAKE 1 TABLET EVERY DAY     Cardiovascular:  Beta Blockers Passed - 05/12/2019  1:10 PM      Passed - Last BP in normal range    BP Readings from Last 1 Encounters:  03/09/19 110/71         Passed - Last Heart Rate in normal range    Pulse Readings from Last 1 Encounters:  03/09/19 96         Passed - Valid encounter within last 6 months    Recent Outpatient Visits          2 months ago Type 2 diabetes mellitus with diabetic neuropathy, without long-term current use of insulin ([place])   [place], [place] T, NP   5 months ago Type 2  diabetes mellitus with diabetic neuropathy, without long-term current use of insulin ([place])   [place], [place] T, NP   1 year ago Annual physical exam   [place], Barbaraann Faster, NP   1 year ago Essential hypertension   [place], Adriana M, PA-C   1 year ago IFG (impaired fasting glucose)   [facility] Kathrine Haddock, NP      Future Appointments            In 1 month Cannady, Barbaraann Faster, NP [facility] [facility]

## 2019-06-21 ENCOUNTER — Encounter: Payer: Self-pay | Admitting: Nurse Practitioner

## 2019-06-21 DIAGNOSIS — Z6836 Body mass index (BMI) 36.0-36.9, adult: Secondary | ICD-10-CM | POA: Insufficient documentation

## 2019-06-21 DIAGNOSIS — Z6839 Body mass index (BMI) 39.0-39.9, adult: Secondary | ICD-10-CM | POA: Insufficient documentation

## 2019-06-24 ENCOUNTER — Ambulatory Visit (INDEPENDENT_AMBULATORY_CARE_PROVIDER_SITE_OTHER): Payer: Medicare Other | Admitting: Nurse Practitioner

## 2019-06-24 ENCOUNTER — Other Ambulatory Visit: Payer: Self-pay

## 2019-06-24 ENCOUNTER — Encounter: Payer: Self-pay | Admitting: Nurse Practitioner

## 2019-06-24 VITALS — BP 105/73 | HR 80 | Temp 97.8°F

## 2019-06-24 DIAGNOSIS — M1A3610 Chronic gout due to renal impairment, right knee, without tophus (tophi): Secondary | ICD-10-CM

## 2019-06-24 DIAGNOSIS — E1159 Type 2 diabetes mellitus with other circulatory complications: Secondary | ICD-10-CM | POA: Diagnosis not present

## 2019-06-24 DIAGNOSIS — E785 Hyperlipidemia, unspecified: Secondary | ICD-10-CM

## 2019-06-24 DIAGNOSIS — I10 Essential (primary) hypertension: Secondary | ICD-10-CM

## 2019-06-24 DIAGNOSIS — E1140 Type 2 diabetes mellitus with diabetic neuropathy, unspecified: Secondary | ICD-10-CM | POA: Diagnosis not present

## 2019-06-24 DIAGNOSIS — I152 Hypertension secondary to endocrine disorders: Secondary | ICD-10-CM

## 2019-06-24 DIAGNOSIS — M109 Gout, unspecified: Secondary | ICD-10-CM | POA: Insufficient documentation

## 2019-06-24 DIAGNOSIS — E1169 Type 2 diabetes mellitus with other specified complication: Secondary | ICD-10-CM | POA: Diagnosis not present

## 2019-06-24 NOTE — Patient Instructions (Signed)
Carbohydrate Counting for Diabetes Mellitus, Adult  Carbohydrate counting is a method of keeping track of how many carbohydrates you eat. Eating carbohydrates naturally increases the amount of sugar (glucose) in the blood. Counting how many carbohydrates you eat helps keep your blood glucose within normal limits, which helps you manage your diabetes (diabetes mellitus). It is important to know how many carbohydrates you can safely have in each meal. This is different for every person. A diet and nutrition specialist (registered dietitian) can help you make a meal plan and calculate how many carbohydrates you should have at each meal and snack. Carbohydrates are found in the following foods:  Grains, such as breads and cereals.  Dried beans and soy products.  Starchy vegetables, such as potatoes, peas, and corn.  Fruit and fruit juices.  Milk and yogurt.  Sweets and snack foods, such as cake, cookies, candy, chips, and soft drinks. How do I count carbohydrates? There are two ways to count carbohydrates in food. You can use either of the methods or a combination of both. Reading "Nutrition Facts" on packaged food The "Nutrition Facts" list is included on the labels of almost all packaged foods and beverages in the [place] It includes:  The serving size.  Information about nutrients in each serving, including the grams (g) of carbohydrate per serving. To use the "Nutrition Facts":  Decide how many servings you will have.  Multiply the number of servings by the number of carbohydrates per serving.  The resulting number is the total amount of carbohydrates that you will be having. Learning standard serving sizes of other foods When you eat carbohydrate foods that are not packaged or do not include "Nutrition Facts" on the label, you need to measure the servings in order to count the amount of carbohydrates:  Measure the foods that you will eat with a food scale or measuring cup, if  needed.  Decide how many standard-size servings you will eat.  Multiply the number of servings by 15. Most carbohydrate-rich foods have about 15 g of carbohydrates per serving. ? For example, if you eat 8 oz (170 g) of strawberries, you will have eaten 2 servings and 30 g of carbohydrates (2 servings x 15 g = 30 g).  For foods that have more than one food mixed, such as soups and casseroles, you must count the carbohydrates in each food that is included. The following list contains standard serving sizes of common carbohydrate-rich foods. Each of these servings has about 15 g of carbohydrates:   hamburger bun or  English muffin.   oz (15 mL) syrup.   oz (14 g) jelly.  1 slice of bread.  1 six-inch tortilla.  3 oz (85 g) cooked rice or pasta.  4 oz (113 g) cooked dried beans.  4 oz (113 g) starchy vegetable, such as peas, corn, or potatoes.  4 oz (113 g) hot cereal.  4 oz (113 g) mashed potatoes or  of a large baked potato.  4 oz (113 g) canned or frozen fruit.  4 oz (120 mL) fruit juice.  4-6 crackers.  6 chicken nuggets.  6 oz (170 g) unsweetened dry cereal.  6 oz (170 g) plain fat-free yogurt or yogurt sweetened with artificial sweeteners.  8 oz (240 mL) milk.  8 oz (170 g) fresh fruit or one small piece of fruit.  24 oz (680 g) popped popcorn. Example of carbohydrate counting Sample meal  3 oz (85 g) chicken breast.  6 oz (170 g)   brown rice.  4 oz (113 g) corn.  8 oz (240 mL) milk.  8 oz (170 g) strawberries with sugar-free whipped topping. Carbohydrate calculation 1. Identify the foods that contain carbohydrates: ? Rice. ? Corn. ? Milk. ? Strawberries. 2. Calculate how many servings you have of each food: ? 2 servings rice. ? 1 serving corn. ? 1 serving milk. ? 1 serving strawberries. 3. Multiply each number of servings by 15 g: ? 2 servings rice x 15 g = 30 g. ? 1 serving corn x 15 g = 15 g. ? 1 serving milk x 15 g = 15 g. ? 1  serving strawberries x 15 g = 15 g. 4. Add together all of the amounts to find the total grams of carbohydrates eaten: ? 30 g + 15 g + 15 g + 15 g = 75 g of carbohydrates total. Summary  Carbohydrate counting is a method of keeping track of how many carbohydrates you eat.  Eating carbohydrates naturally increases the amount of sugar (glucose) in the blood.  Counting how many carbohydrates you eat helps keep your blood glucose within normal limits, which helps you manage your diabetes.  A diet and nutrition specialist (registered dietitian) can help you make a meal plan and calculate how many carbohydrates you should have at each meal and snack. This information is not intended to replace advice given to you by your health care provider. Make sure you discuss any questions you have with your health care provider. Document Revised: 12/27/2016 Document Reviewed: 11/16/2015 Elsevier Patient Education  2020 Elsevier Inc.  

## 2019-06-24 NOTE — Progress Notes (Signed)
BP 105/73   Pulse 80   Temp 97.8 F (36.6 C) (Oral)   LMP  (LMP Unknown)    Subjective:    [name], female    DOB: [birth year], 74 y.o.   MRN: [id number]  HPI: Catherine Hinton is a 74 y.o. female  Chief Complaint  Patient presents with  . Diabetes  . Hyperlipidemia  . Hypertension    . This visit was completed via telephone due to the restrictions of the COVID-19 pandemic. All issues as above were discussed and addressed but no physical exam was performed. If it was felt that the patient should be evaluated in the office, they were directed there. The patient verbally consented to this visit. Patient was unable to complete an audio/visual visit due to Lack of equipment. Due to the catastrophic nature of the COVID-19 pandemic, this visit was done through audio contact only. . Location of the patient: home . Location of the provider: work . Those involved with this call:  . Provider: Marnee Guarneri, DNP . CMA: Yvonna Alanis, CMA . Front Desk/Registration: Don Perking  . Time spent on call: 15 minutes on the phone discussing health concerns. 10 minutes total spent in review of patient's record and preparation of their chart.  . I verified patient identity using two factors (patient name and date of birth). Patient consents verbally to being seen via telemedicine visit today.    DIABETES Last A1C was 6.6% in October.  Continues on Metformin 500 MG twice daily. Reports poor diet over Christmas, was lonely Christmas.  However, has started focusing on diet again.   Hypoglycemic episodes:no Polydipsia/polyuria: no Visual disturbance: no Chest pain: no Paresthesias: no Glucose Monitoring: yes  Accucheck frequency: Daily  Fasting glucose: average 170-190, 184 this morning  Post prandial:  Evening:  Before meals: Taking Insulin?: no  Long acting insulin:  Short acting insulin: Blood Pressure Monitoring: daily Retinal Examination: Up to Date Foot  Exam: Up to Date Pneumovax: Up to Date Influenza: Up to Date Aspirin: yes   HYPERTENSION / HYPERLIPIDEMIA Continues on Metoprolol and Valsartan-HCTZ, ASA and Lopid. Satisfied with current treatment? yes Duration of hypertension: chronic BP monitoring frequency: daily BP range: 110-120/70 at home BP medication side effects: no Duration of hyperlipidemia: chronic Cholesterol medication side effects: no Cholesterol supplements: none Medication compliance: good compliance Aspirin: yes Recent stressors: no Recurrent headaches: no Visual changes: no Palpitations: no Dyspnea: no Chest pain: no Lower extremity edema: no Dizzy/lightheaded: no   GOUT Last flare was years ago.  Continues on Allopurinol.   Duration:chronic Swelling: no Redness: no Trauma: no Recent dietary change or indiscretion: no Fevers: no Nausea/vomiting: no Status:  stable  Relevant past medical, surgical, family and social history reviewed and updated as indicated. Interim medical history since our last visit reviewed. Allergies and medications reviewed and updated.  Review of Systems  Constitutional: Negative for activity change, appetite change, diaphoresis, fatigue and fever.  Respiratory: Negative for cough, chest tightness, shortness of breath and wheezing.   Cardiovascular: Negative for chest pain, palpitations and leg swelling.  Gastrointestinal: Negative.   Endocrine: Negative for cold intolerance, heat intolerance, polydipsia, polyphagia and polyuria.  Neurological: Negative.   Psychiatric/Behavioral: Negative.     Per HPI unless specifically indicated above     Objective:    BP 105/73   Pulse 80   Temp 97.8 F (36.6 C) (Oral)   LMP  (LMP Unknown)   Wt Readings from Last 3 Encounters:  05/05/18 220 lb (99.8  kg)  04/10/18 220 lb 4.8 oz (99.9 kg)  02/18/18 218 lb 3.2 oz (99 kg)    Physical Exam   Unable to perform due to telephone visit only  Results for orders placed or  performed in visit on 03/24/19  Bayer DCA Hb A1c Waived  Result Value Ref Range   HB A1C (BAYER DCA - WAIVED) 6.6 <7.0 %  Comprehensive metabolic panel  Result Value Ref Range   Glucose 134 (H) 65 - 99 mg/dL   BUN 10 8 - 27 mg/dL   Creatinine, Ser 0.53 (L) 0.57 - 1.00 mg/dL   GFR calc non Af Amer 96 >59 mL/min/1.73   GFR calc Af Amer 110 >59 mL/min/1.73   BUN/Creatinine Ratio 19 12 - 28   Sodium 142 134 - 144 mmol/L   Potassium 3.9 3.5 - 5.2 mmol/L   Chloride 99 96 - 106 mmol/L   CO2 26 20 - 29 mmol/L   Calcium 9.3 8.7 - 10.3 mg/dL   Total Protein 6.7 6.0 - 8.5 g/dL   Albumin 4.5 3.7 - 4.7 g/dL   Globulin, Total 2.2 1.5 - 4.5 g/dL   Albumin/Globulin Ratio 2.0 1.2 - 2.2   Bilirubin Total 0.5 0.0 - 1.2 mg/dL   Alkaline Phosphatase 49 39 - 117 IU/L   AST 21 0 - 40 IU/L   ALT 17 0 - 32 IU/L  Lipid Panel w/o Chol/HDL Ratio  Result Value Ref Range   Cholesterol, Total 124 100 - 199 mg/dL   Triglycerides 139 0 - 149 mg/dL   HDL 34 (L) >39 mg/dL   VLDL Cholesterol Cal 25 5 - 40 mg/dL   LDL Chol Calc (NIH) 65 0 - 99 mg/dL      Assessment & Plan:   Problem List Items Addressed This Visit      Cardiovascular and Mediastinum   Hypertension associated with diabetes ([place])    Chronic, stable with BP below goal on home readings.  Continue current medication regimen and adjust as needed based on BP readings.  Obtain outpatient labs.  Could consider discontinuation of HCTZ at upcoming visit, as BP well controlled and history of gout.        Endocrine   Hyperlipidemia associated with type 2 diabetes mellitus (HCC)    Chronic, stable.  Continue current medication regimen and adjust as needed.  Obtain outpatient labs.        Type 2 diabetes mellitus with diabetic neuropathy, unspecified (HCC) - Primary    Chronic, ongoing.  Continues on Metformin. Last A1C 6.6%.  Consider increase of Metformin if  elevation in A1C level above goal, she would prefer to return to focus on diet which has  worked in past. [place] outpatient labs.  Return in 3 months.      Relevant Orders   Microalbumin, Urine Waived   Bayer DCA Hb A1c Waived   Uric acid     Other   Gout    Chronic, ongoing.  Continue Allopurinol at this time, but may consider reduction in future.  Obtain outpatient labs.         I discussed the assessment and treatment plan with the patient. The patient was provided an opportunity to ask questions and all were answered. The patient agreed with the plan and demonstrated an understanding of the instructions.   The patient was advised to call back or seek an in-person evaluation if the symptoms worsen or if the condition fails to improve as anticipated.   I provided 15  minutes of time during this encounter.  Follow up plan: Return in about 3 months (around 09/22/2019) for Annual physical.

## 2019-06-24 NOTE — Assessment & Plan Note (Signed)
Chronic, ongoing.  Continue Allopurinol at this time, but may consider reduction in future.  Obtain outpatient labs.

## 2019-06-24 NOTE — Assessment & Plan Note (Signed)
Chronic, stable with BP below goal on home readings.  Continue current medication regimen and adjust as needed based on BP readings.  Obtain outpatient labs.  Could consider discontinuation of HCTZ at upcoming visit, as BP well controlled and history of gout.

## 2019-06-24 NOTE — Assessment & Plan Note (Signed)
Chronic, ongoing.  Continues on Metformin. Last A1C 6.6%.  Consider increase of Metformin if  elevation in A1C level above goal, she would prefer to return to focus on diet which has worked in past. [place] outpatient labs.  Return in 3 months.

## 2019-06-24 NOTE — Assessment & Plan Note (Signed)
Chronic, stable.  Continue current medication regimen and adjust as needed.  Obtain outpatient labs.

## 2019-07-10 ENCOUNTER — Other Ambulatory Visit: Payer: Self-pay

## 2019-07-10 ENCOUNTER — Other Ambulatory Visit: Payer: Medicare Other

## 2019-07-10 DIAGNOSIS — E1140 Type 2 diabetes mellitus with diabetic neuropathy, unspecified: Secondary | ICD-10-CM

## 2019-07-10 LAB — BAYER DCA HB A1C WAIVED: HB A1C (BAYER DCA - WAIVED): 8 % — ABNORMAL HIGH (ref <7.0)

## 2019-07-10 LAB — MICROALBUMIN, URINE WAIVED
Creatinine, Urine Waived: 50 mg/dL (ref 10–300)
Microalb, Ur Waived: 10 mg/L (ref 0–19)
Microalb/Creat Ratio: <30 mg/g (ref <30)

## 2019-07-11 LAB — URIC ACID: Uric Acid: 4.1 mg/dL (ref 3.1–7.9)

## 2019-07-13 ENCOUNTER — Other Ambulatory Visit: Payer: Self-pay | Admitting: Nurse Practitioner

## 2019-07-13 ENCOUNTER — Telehealth: Payer: Self-pay | Admitting: Nurse Practitioner

## 2019-07-13 MED ORDER — METFORMIN HCL 500 MG PO TABS: 1000.0000 mg | ORAL_TABLET | Freq: Two times a day (BID) | ORAL | 2 refills | Status: DC

## 2019-07-13 NOTE — Telephone Encounter (Signed)
Spoke to patient about A1C elevation from 6.6 to 8.0%.  She does endorse poor diet and not exercising over past months due to Covid.  Will increase Metformin to 1000 MG BID and recheck A1C in 3 months.  BS this morning was 180.  She does endorse occasional neuropathic foot pain and is to call if and be seen sooner if this worsens.

## 2019-07-13 NOTE — Progress Notes (Signed)
Spoke to patient via telephone, review telephone note 07/13/19

## 2019-08-13 ENCOUNTER — Other Ambulatory Visit: Payer: Self-pay | Admitting: Nurse Practitioner

## 2019-08-25 DIAGNOSIS — H353131 Nonexudative age-related macular degeneration, bilateral, early dry stage: Secondary | ICD-10-CM | POA: Diagnosis not present

## 2019-08-25 DIAGNOSIS — H26492 Other secondary cataract, left eye: Secondary | ICD-10-CM | POA: Diagnosis not present

## 2019-08-25 DIAGNOSIS — H40019 Open angle with borderline findings, low risk, unspecified eye: Secondary | ICD-10-CM | POA: Diagnosis not present

## 2019-08-25 DIAGNOSIS — H02886 Meibomian gland dysfunction of left eye, unspecified eyelid: Secondary | ICD-10-CM | POA: Diagnosis not present

## 2019-08-25 DIAGNOSIS — E119 Type 2 diabetes mellitus without complications: Secondary | ICD-10-CM | POA: Diagnosis not present

## 2019-08-25 DIAGNOSIS — H02883 Meibomian gland dysfunction of right eye, unspecified eyelid: Secondary | ICD-10-CM | POA: Diagnosis not present

## 2019-08-25 DIAGNOSIS — H04129 Dry eye syndrome of unspecified lacrimal gland: Secondary | ICD-10-CM | POA: Diagnosis not present

## 2019-08-25 LAB — HM DIABETES EYE EXAM

## 2019-09-18 ENCOUNTER — Other Ambulatory Visit: Payer: Self-pay

## 2019-09-18 ENCOUNTER — Other Ambulatory Visit: Payer: Self-pay | Admitting: Family Medicine

## 2019-09-18 ENCOUNTER — Ambulatory Visit
Admission: RE | Admit: 2019-09-18 | Discharge: 2019-09-18 | Disposition: A | Discharge status: Home / Self Care | Payer: Medicare Other | Source: Ambulatory Visit | Attending: Family Medicine | Admitting: Family Medicine

## 2019-09-18 ENCOUNTER — Ambulatory Visit: Payer: Self-pay

## 2019-09-18 ENCOUNTER — Ambulatory Visit (INDEPENDENT_AMBULATORY_CARE_PROVIDER_SITE_OTHER): Payer: Medicare Other | Admitting: Family Medicine

## 2019-09-18 ENCOUNTER — Encounter: Payer: Self-pay | Admitting: Family Medicine

## 2019-09-18 VITALS — BP 115/75 | HR 114 | Temp 98.6°F | Ht 65.35 in | Wt 215.2 lb

## 2019-09-18 DIAGNOSIS — R1084 Generalized abdominal pain: Secondary | ICD-10-CM

## 2019-09-18 DIAGNOSIS — N200 Calculus of kidney: Secondary | ICD-10-CM | POA: Diagnosis not present

## 2019-09-18 LAB — POCT I-STAT CREATININE: Creatinine, Ser: 0.5 mg/dL (ref 0.44–1.00)

## 2019-09-18 LAB — CBC WITH DIFFERENTIAL/PLATELET
Hematocrit: 39.4 % (ref 34.0–46.6)
Hemoglobin: 13.8 g/dL (ref 11.1–15.9)
Lymphocytes Absolute: 2.1 10*3/uL (ref 0.7–3.1)
Lymphs: 31 %
MCH: 30.1 pg (ref 26.6–33.0)
MCHC: 35 g/dL (ref 31.5–35.7)
MCV: 86 fL (ref 79–97)
MID (Absolute): 0.3 10*3/uL (ref 0.1–1.6)
MID: 5 %
Neutrophils Absolute: 4.3 10*3/uL (ref 1.4–7.0)
Neutrophils: 64 %
Platelets: 291 10*3/uL (ref 150–450)
RBC: 4.58 x10E6/uL (ref 3.77–5.28)
RDW: 14.1 % (ref 11.7–15.4)
WBC: 6.7 10*3/uL (ref 3.4–10.8)

## 2019-09-18 MED ORDER — MAGNESIUM CITRATE PO SOLN: 1.0000 | Freq: Once | ORAL | 1 refills | Status: AC

## 2019-09-18 MED ORDER — IOHEXOL 300 MG/ML  SOLN
100.0000 mL | Freq: Once | INTRAMUSCULAR | Status: AC | PRN
  Administered 2019-09-18: 100 mL via INTRAVENOUS

## 2019-09-18 NOTE — Telephone Encounter (Signed)
Pt. Reports she started having right sided abdominal pain 1 month ago. "Twinges of pain. I have had some constipation issues recently and I have a history of hernias." Pain seems worse at night. No fever, vomiting or diarrhea. States she is eating and drinking well.Pain is 4/10. No availability in office with her PCP. Appointment made for today.   Reason for Disposition . [1] MODERATE pain (e.g., interferes with normal activities) AND [2] pain comes and goes (cramps) AND [3] present > 24 hours  (Exception: pain with Vomiting or Diarrhea - see that Guideline)  Answer Assessment - Initial Assessment Questions 1. LOCATION: "Where does it hurt?"      Right side 2. RADIATION: "Does the pain shoot anywhere else?" (e.g., chest, back)     No 3. ONSET: "When did the pain begin?" (e.g., minutes, hours or days ago)      Started 1 month ago, but now worse 4. SUDDEN: "Gradual or sudden onset?"     Gradual 5. PATTERN "Does the pain come and go, or is it constant?"    - If constant: "Is it getting better, staying the same, or worsening?"      (Note: Constant means the pain never goes away completely; most serious pain is constant and it progresses)     - If intermittent: "How long does it last?" "Do you have pain now?"     (Note: Intermittent means the pain goes away completely between bouts)     Worse at night 6. SEVERITY: "How bad is the pain?"  (e.g., Scale 1-10; mild, moderate, or severe)   - MILD (1-3): doesn't interfere with normal activities, abdomen soft and not tender to touch    - MODERATE (4-7): interferes with normal activities or awakens from sleep, tender to touch    - SEVERE (8-10): excruciating pain, doubled over, unable to do any normal activities      4 7. RECURRENT SYMPTOM: "Have you ever had this type of abdominal pain before?" If so, ask: "When was the last time?" and "What happened that time?"      No 8. CAUSE: "What do you think is causing the abdominal pain?"     unsure 9.  RELIEVING/AGGRAVATING FACTORS: "What makes it better or worse?" (e.g., movement, antacids, bowel movement)     No 10. OTHER SYMPTOMS: "Has there been any vomiting, diarrhea, constipation, or urine problems?"       Some constipation 11. PREGNANCY: "Is there any chance you are pregnant?" "When was your last menstrual period?"       No  Protocols used: ABDOMINAL PAIN - [facility]

## 2019-09-18 NOTE — Progress Notes (Signed)
Called patient and let her know that her CT was normal. Will send through some mag citrate to try to clean her out. Follow up with PCP as scheduled and call if not getting better or getting worse.

## 2019-09-18 NOTE — Addendum Note (Signed)
Addended by: Valerie Roys on: 09/18/2019 11:52 AM   Modules accepted: Orders

## 2019-09-18 NOTE — Progress Notes (Signed)
BP 115/75 (BP Location: Left Arm, Patient Position: Sitting, Cuff Size: Normal)   Pulse (!) 114   Temp 98.6 F (37 C) (Oral)   Ht 5' 5.35" (1.66 m)   Wt 215 lb 3.2 oz (97.6 kg)   LMP  (LMP Unknown)   SpO2 97%   BMI 35.42 kg/m    Subjective:    [name], female    DOB: [birth year], 74 y.o.   MRN: [id number]  HPI: Catherine Hinton is a 74 y.o. female  Chief Complaint  Patient presents with  . Abdominal Pain   ABDOMINAL PAIN  Duration: off and on for about a month and worse in the last week.  Onset: sudden Severity: moderate Quality: dull, burning pain Location:  RUQ and LLQ  Episode duration:  Radiation: no Frequency: intermittent Alleviating factors: having a bowel movement Aggravating factors: nothing Status: worse Treatments attempted: laxatives- has been taking them without going Fever: no Nausea: no Vomiting: no Weight loss: yes Decreased appetite: yes Diarrhea: no Constipation: yes Blood in stool: no Heartburn: no Jaundice: no Rash: no Dysuria/urinary frequency: no Hematuria: no History of sexually transmitted disease: no Recurrent NSAID use: no   Relevant past medical, surgical, family and social history reviewed and updated as indicated. Interim medical history since our last visit reviewed. Allergies and medications reviewed and updated.  Review of Systems  Constitutional: Negative.   Respiratory: Negative.   Cardiovascular: Negative.   Gastrointestinal: Positive for abdominal pain and constipation. Negative for abdominal distention, anal bleeding, blood in stool, diarrhea, nausea, rectal pain and vomiting.  Genitourinary: Negative.   Musculoskeletal: Negative.   Skin: Negative.   Hematological: Negative.   Psychiatric/Behavioral: Negative.     Per HPI unless specifically indicated above     Objective:    BP 115/75 (BP Location: Left Arm, Patient Position: Sitting, Cuff Size: Normal)   Pulse (!) 114   Temp 98.6 F  (37 C) (Oral)   Ht 5' 5.35" (1.66 m)   Wt 215 lb 3.2 oz (97.6 kg)   LMP  (LMP Unknown)   SpO2 97%   BMI 35.42 kg/m   Wt Readings from Last 3 Encounters:  09/18/19 215 lb 3.2 oz (97.6 kg)  05/05/18 220 lb (99.8 kg)  04/10/18 220 lb 4.8 oz (99.9 kg)    Physical Exam Vitals and nursing note reviewed.  Constitutional:      General: She is not in acute distress.    Appearance: Normal appearance. She is obese. She is not ill-appearing, toxic-appearing or diaphoretic.  HENT:     Head: Normocephalic and atraumatic.     Right Ear: External ear normal.     Left Ear: External ear normal.     Nose: Nose normal.     Mouth/Throat:     Mouth: Mucous membranes are moist.     Pharynx: Oropharynx is clear.  Eyes:     General: No scleral icterus.       Right eye: No discharge.        Left eye: No discharge.     Extraocular Movements: Extraocular movements intact.     Conjunctiva/sclera: Conjunctivae normal.     Pupils: Pupils are equal, round, and reactive to light.  Cardiovascular:     Rate and Rhythm: Normal rate and regular rhythm.     Pulses: Normal pulses.     Heart sounds: Normal heart sounds. No murmur. No friction rub. No gallop.   Pulmonary:     Effort: Pulmonary effort is normal.  No respiratory distress.     Breath sounds: Normal breath sounds. No stridor. No wheezing, rhonchi or rales.  Chest:     Chest wall: No tenderness.  Abdominal:     General: Abdomen is flat. Bowel sounds are normal.     Palpations: Abdomen is soft.     Tenderness: There is abdominal tenderness in the right upper quadrant and epigastric area. There is guarding. There is no right CVA tenderness, left CVA tenderness or rebound. Negative signs include Murphy's sign, Rovsing's sign, McBurney's sign, psoas sign and obturator sign.  Musculoskeletal:        General: Normal range of motion.     Cervical back: Normal range of motion and neck supple.  Skin:    General: Skin is warm and dry.     Capillary  Refill: Capillary refill takes less than 2 seconds.     Coloration: Skin is not jaundiced or pale.     Findings: No bruising, erythema, lesion or rash.  Neurological:     General: No focal deficit present.     Mental Status: She is alert and oriented to person, place, and time. Mental status is at baseline.  Psychiatric:        Mood and Affect: Mood normal.        Behavior: Behavior normal.        Thought Content: Thought content normal.        Judgment: Judgment normal.     Results for orders placed or performed in visit on 08/28/19  [facility] DIABETES EYE EXAM  Result Value Ref Range   HM Diabetic Eye Exam No Retinopathy No Retinopathy      Assessment & Plan:   Problem List Items Addressed This Visit    None    Visit Diagnoses    Generalized abdominal pain    -  Primary   Worsening abdominal pain x 1 month- worse in the last week. Feeling very ill. Significant tenderness epigastric and wt loss. Stat CT abdomen without r/o mass.   Relevant Orders   CBC With Differential/Platelet   Comprehensive metabolic panel   CT Abdomen Pelvis W Contrast       Follow up plan: Return Pending results.

## 2019-09-19 LAB — COMPREHENSIVE METABOLIC PANEL
ALT: 17 IU/L (ref 0–32)
AST: 19 IU/L (ref 0–40)
Albumin/Globulin Ratio: 2.1 (ref 1.2–2.2)
Albumin: 4.8 g/dL — ABNORMAL HIGH (ref 3.7–4.7)
Alkaline Phosphatase: 48 IU/L (ref 39–117)
BUN/Creatinine Ratio: 16 (ref 12–28)
BUN: 10 mg/dL (ref 8–27)
Bilirubin Total: 0.4 mg/dL (ref 0.0–1.2)
CO2: 23 mmol/L (ref 20–29)
Calcium: 10.2 mg/dL (ref 8.7–10.3)
Chloride: 99 mmol/L (ref 96–106)
Creatinine, Ser: 0.62 mg/dL (ref 0.57–1.00)
GFR calc Af Amer: 105 mL/min/{1.73_m2} (ref >59)
GFR calc non Af Amer: 91 mL/min/{1.73_m2} (ref >59)
Globulin, Total: 2.3 g/dL (ref 1.5–4.5)
Glucose: 128 mg/dL — ABNORMAL HIGH (ref 65–99)
Potassium: 3.8 mmol/L (ref 3.5–5.2)
Sodium: 141 mmol/L (ref 134–144)
Total Protein: 7.1 g/dL (ref 6.0–8.5)

## 2019-09-21 ENCOUNTER — Encounter: Payer: Self-pay | Admitting: Family Medicine

## 2019-10-02 ENCOUNTER — Ambulatory Visit (INDEPENDENT_AMBULATORY_CARE_PROVIDER_SITE_OTHER): Payer: Medicare Other | Admitting: Nurse Practitioner

## 2019-10-02 ENCOUNTER — Other Ambulatory Visit: Payer: Self-pay

## 2019-10-02 ENCOUNTER — Encounter: Payer: Self-pay | Admitting: Nurse Practitioner

## 2019-10-02 VITALS — BP 127/74 | HR 98 | Temp 97.4°F | Ht 65.26 in | Wt 219.0 lb

## 2019-10-02 DIAGNOSIS — E1140 Type 2 diabetes mellitus with diabetic neuropathy, unspecified: Secondary | ICD-10-CM

## 2019-10-02 DIAGNOSIS — E785 Hyperlipidemia, unspecified: Secondary | ICD-10-CM

## 2019-10-02 DIAGNOSIS — I10 Essential (primary) hypertension: Secondary | ICD-10-CM | POA: Diagnosis not present

## 2019-10-02 DIAGNOSIS — E538 Deficiency of other specified B group vitamins: Secondary | ICD-10-CM

## 2019-10-02 DIAGNOSIS — E1159 Type 2 diabetes mellitus with other circulatory complications: Secondary | ICD-10-CM | POA: Diagnosis not present

## 2019-10-02 DIAGNOSIS — E559 Vitamin D deficiency, unspecified: Secondary | ICD-10-CM

## 2019-10-02 DIAGNOSIS — Z0000 Encounter for general adult medical examination without abnormal findings: Secondary | ICD-10-CM | POA: Diagnosis not present

## 2019-10-02 DIAGNOSIS — E1169 Type 2 diabetes mellitus with other specified complication: Secondary | ICD-10-CM

## 2019-10-02 DIAGNOSIS — G4733 Obstructive sleep apnea (adult) (pediatric): Secondary | ICD-10-CM

## 2019-10-02 DIAGNOSIS — M1A3610 Chronic gout due to renal impairment, right knee, without tophus (tophi): Secondary | ICD-10-CM

## 2019-10-02 DIAGNOSIS — Z6836 Body mass index (BMI) 36.0-36.9, adult: Secondary | ICD-10-CM

## 2019-10-02 LAB — BAYER DCA HB A1C WAIVED: HB A1C (BAYER DCA - WAIVED): 6.5 % (ref <7.0)

## 2019-10-02 MED ORDER — GABAPENTIN 300 MG PO CAPS: 300.0000 mg | ORAL_CAPSULE | Freq: Three times a day (TID) | ORAL | 3 refills | Status: DC

## 2019-10-02 MED ORDER — VALSARTAN-HYDROCHLOROTHIAZIDE 160-12.5 MG PO TABS: 1.0000 | ORAL_TABLET | Freq: Every day | ORAL | 4 refills | Status: DC

## 2019-10-02 MED ORDER — METOPROLOL SUCCINATE ER 25 MG PO TB24: 25.0000 mg | ORAL_TABLET | Freq: Every day | ORAL | 4 refills | Status: DC

## 2019-10-02 MED ORDER — ALLOPURINOL 300 MG PO TABS: 300.0000 mg | ORAL_TABLET | Freq: Every day | ORAL | 4 refills | Status: DC

## 2019-10-02 NOTE — Patient Instructions (Signed)
Gabapentin capsules or tablets What is this medicine? GABAPENTIN (GA ba pen tin) is used to control seizures in certain types of epilepsy. It is also used to treat certain types of nerve pain. This medicine may be used for other purposes; ask your health care provider or pharmacist if you have questions. COMMON BRAND NAME(S): Active-PAC with Gabapentin, Gabarone, Neurontin What should I tell my health care provider before I take this medicine? They need to know if you have any of these conditions:  history of drug abuse or alcohol abuse problem  kidney disease  lung or breathing disease  suicidal thoughts, plans, or attempt; a previous suicide attempt by you or a family member  an unusual or allergic reaction to gabapentin, other medicines, foods, dyes, or preservatives  pregnant or trying to get pregnant  breast-feeding How should I use this medicine? Take this medicine by mouth with a glass of water. Follow the directions on the prescription label. You can take it with or without food. If it upsets your stomach, take it with food. Take your medicine at regular intervals. Do not take it more often than directed. Do not stop taking except on your doctor's advice. If you are directed to break the 600 or 800 mg tablets in half as part of your dose, the extra half tablet should be used for the next dose. If you have not used the extra half tablet within 28 days, it should be thrown away. A special MedGuide will be given to you by the pharmacist with each prescription and refill. Be sure to read this information carefully each time. Talk to your pediatrician regarding the use of this medicine in children. While this drug may be prescribed for children as young as 3 years for selected conditions, precautions do apply. Overdosage: If you think you have taken too much of this medicine contact a poison control center or emergency room at once. NOTE: This medicine is only for you. Do not share this  medicine with others. What if I miss a dose? If you miss a dose, take it as soon as you can. If it is almost time for your next dose, take only that dose. Do not take double or extra doses. What may interact with this medicine? This medicine may interact with the following medications:  alcohol  antihistamines for allergy, cough, and cold  certain medicines for anxiety or sleep  certain medicines for depression like amitriptyline, fluoxetine, sertraline  certain medicines for seizures like phenobarbital, primidone  certain medicines for stomach problems  general anesthetics like halothane, isoflurane, methoxyflurane, propofol  local anesthetics like lidocaine, pramoxine, tetracaine  medicines that relax muscles for surgery  narcotic medicines for pain  phenothiazines like chlorpromazine, mesoridazine, prochlorperazine, thioridazine This list may not describe all possible interactions. Give your health care provider a list of all the medicines, herbs, non-prescription drugs, or dietary supplements you use. Also tell them if you smoke, drink alcohol, or use illegal drugs. Some items may interact with your medicine. What should I watch for while using this medicine? Visit your doctor or health care provider for regular checks on your progress. You may want to keep a record at home of how you feel your condition is responding to treatment. You may want to share this information with your doctor or health care provider at each visit. You should contact your doctor or health care provider if your seizures get worse or if you have any new types of seizures. Do not stop taking   this medicine or any of your seizure medicines unless instructed by your doctor or health care provider. Stopping your medicine suddenly can increase your seizures or their severity. This medicine may cause serious skin reactions. They can happen weeks to months after starting the medicine. Contact your health care  provider right away if you notice fevers or flu-like symptoms with a rash. The rash may be red or purple and then turn into blisters or peeling of the skin. Or, you might notice a red rash with swelling of the face, lips or lymph nodes in your neck or under your arms. Wear a medical identification bracelet or chain if you are taking this medicine for seizures, and carry a card that lists all your medications. You may get drowsy, dizzy, or have blurred vision. Do not drive, use machinery, or do anything that needs mental alertness until you know how this medicine affects you. To reduce dizzy or fainting spells, do not sit or stand up quickly, especially if you are an older patient. Alcohol can increase drowsiness and dizziness. Avoid alcoholic drinks. Your mouth may get dry. Chewing sugarless gum or sucking hard candy, and drinking plenty of water will help. The use of this medicine may increase the chance of suicidal thoughts or actions. Pay special attention to how you are responding while on this medicine. Any worsening of mood, or thoughts of suicide or dying should be reported to your health care provider right away. Women who become pregnant while using this medicine may enroll in the [place] Pregnancy Registry by calling [phone]. This registry collects information about the safety of antiepileptic drug use during pregnancy. What side effects may I notice from receiving this medicine? Side effects that you should report to your doctor or health care professional as soon as possible:  allergic reactions like skin rash, itching or hives, swelling of the face, lips, or tongue  breathing problems  rash, fever, and swollen lymph nodes  redness, blistering, peeling or loosening of the skin, including inside the mouth  suicidal thoughts, mood changes Side effects that usually do not require medical attention (report to your doctor or health care professional if they  continue or are bothersome):  dizziness  drowsiness  headache  nausea, vomiting  swelling of ankles, feet, hands  tiredness This list may not describe all possible side effects. Call your doctor for medical advice about side effects. You may report side effects to FDA at 1-800-FDA-1088. Where should I keep my medicine? Keep out of reach of children. This medicine may cause accidental overdose and death if it taken by other adults, children, or pets. Mix any unused medicine with a substance like cat litter or coffee grounds. Then throw the medicine away in a sealed container like a sealed bag or a coffee can with a lid. Do not use the medicine after the expiration date. Store at room temperature between 15 and 30 degrees C (59 and 86 degrees F). NOTE: This sheet is a summary. It may not cover all possible information. If you have questions about this medicine, talk to your doctor, pharmacist, or health care provider.  2020 Elsevier/Gold Standard (2018-09-05 14:16:43) Neuropathic Pain Neuropathic pain is pain caused by damage to the nerves that are responsible for certain sensations in your body (sensory nerves). The pain can be caused by:  Damage to the sensory nerves that send signals to your spinal cord and brain (peripheral nervous system).  Damage to the sensory nerves in your brain  or spinal cord (central nervous system). Neuropathic pain can make you more sensitive to pain. Even a minor sensation can feel very painful. This is usually a long-term condition that can be difficult to treat. The type of pain differs from person to person. It may:  Start suddenly (acute), or it may develop slowly and last for a long time (chronic).  Come and go as damaged nerves heal, or it may stay at the same level for years.  Cause emotional distress, loss of sleep, and a lower quality of life. What are the causes? The most common cause of this condition is diabetes. Many other diseases and  conditions can also cause neuropathic pain. Causes of neuropathic pain can be classified as:  Toxic. This is caused by medicines and chemicals. The most common cause of toxic neuropathic pain is damage from cancer treatments (chemotherapy).  Metabolic. This can be caused by: ? Diabetes. This is the most common disease that damages the nerves. ? Lack of vitamin B from long-term alcohol abuse.  Traumatic. Any injury that cuts, crushes, or stretches a nerve can cause damage and pain. A common example is feeling pain after losing an arm or leg (phantom limb pain).  Compression-related. If a sensory nerve gets trapped or compressed for a long period of time, the blood supply to the nerve can be cut off.  Vascular. Many blood vessel diseases can cause neuropathic pain by decreasing blood supply and oxygen to nerves.  Autoimmune. This type of pain results from diseases in which the body's defense system (immune system) mistakenly attacks sensory nerves. Examples of autoimmune diseases that can cause neuropathic pain include lupus and multiple sclerosis.  Infectious. Many types of viral infections can damage sensory nerves and cause pain. Shingles infection is a common cause of this type of pain.  Inherited. Neuropathic pain can be a symptom of many diseases that are passed down through families (genetic). What increases the risk? You are more likely to develop this condition if:  You have diabetes.  You smoke.  You drink too much alcohol.  You are taking certain medicines, including medicines that kill cancer cells (chemotherapy) or that treat immune system disorders. What are the signs or symptoms? The main symptom is pain. Neuropathic pain is often described as:  Burning.  Shock-like.  Stinging.  Hot or cold.  Itching. How is this diagnosed? No single test can diagnose neuropathic pain. It is diagnosed based on:  Physical exam and your symptoms. Your health care provider will  ask you about your pain. You may be asked to use a pain scale to describe how bad your pain is.  Tests. These may be done to see if you have a high sensitivity to pain and to help find the cause and location of any sensory nerve damage. They include: ? Nerve conduction studies to test how well nerve signals travel through your sensory nerves (electrodiagnostic testing). ? Stimulating your sensory nerves through electrodes on your skin and measuring the response in your spinal cord and brain (somatosensory evoked potential).  Imaging studies, such as: ? X-rays. ? CT scan. ? MRI. How is this treated? Treatment for neuropathic pain may change over time. You may need to try different treatment options or a combination of treatments. Some options include:  Treating the underlying cause of the neuropathy, such as diabetes, kidney disease, or vitamin deficiencies.  Stopping medicines that can cause neuropathy, such as chemotherapy.  Medicine to relieve pain. Medicines may include: ? Prescription or  over-the-counter pain medicine. ? Anti-seizure medicine. ? Antidepressant medicines. ? Pain-relieving patches that are applied to painful areas of skin. ? A medicine to numb the area (local anesthetic), which can be injected as a nerve block.  Transcutaneous nerve stimulation. This uses electrical currents to block painful nerve signals. The treatment is painless.  Alternative treatments, such as: ? Acupuncture. ? Meditation. ? Massage. ? Physical therapy. ? Pain management programs. ? Counseling. Follow these instructions at home: Medicines   Take over-the-counter and prescription medicines only as told by your health care provider.  Do not drive or use heavy machinery while taking prescription pain medicine.  If you are taking prescription pain medicine, take actions to prevent or treat constipation. Your health care provider may recommend that you: ? Drink enough fluid to keep your  urine pale yellow. ? Eat foods that are high in fiber, such as fresh fruits and vegetables, whole grains, and beans. ? Limit foods that are high in fat and processed sugars, such as fried or sweet foods. ? Take an over-the-counter or prescription medicine for constipation. Lifestyle   Have a good support system at home.  Consider joining a chronic pain support group.  Do not use any products that contain nicotine or tobacco, such as cigarettes and e-cigarettes. If you need help quitting, ask your health care provider.  Do not drink alcohol. General instructions  Learn as much as you can about your condition.  Work closely with all your health care providers to find the treatment plan that works best for you.  Ask your health care provider what activities are safe for you.  Keep all follow-up visits as told by your health care provider. This is important. Contact a health care provider if:  Your pain treatments are not working.  You are having side effects from your medicines.  You are struggling with tiredness (fatigue), mood changes, depression, or anxiety. Summary  Neuropathic pain is pain caused by damage to the nerves that are responsible for certain sensations in your body (sensory nerves).  Neuropathic pain may come and go as damaged nerves heal, or it may stay at the same level for years.  Neuropathic pain is usually a long-term condition that can be difficult to treat. Consider joining a chronic pain support group. This information is not intended to replace advice given to you by your health care provider. Make sure you discuss any questions you have with your health care provider. Document Revised: 09/25/2018 Document Reviewed: 06/21/2017 Elsevier Patient Education  [place].

## 2019-10-02 NOTE — Assessment & Plan Note (Signed)
With T2DM.  Recommend heavy focus on healthy diet and regular exercise regimen. 

## 2019-10-02 NOTE — Assessment & Plan Note (Addendum)
Chronic, ongoing.  A1C 6.5% today, downward trend from previous A1C 8%.  Continue Metformin 1000 MG BID and adjust regimen as needed.  Focus on diabetic diet at home and regular exercise.  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  Script for Gabapentin 300 MG sent.  Check B12 level with chronic Metformin use.  Return in 3 months.

## 2019-10-02 NOTE — Assessment & Plan Note (Signed)
Recommended eating smaller high protein, low fat meals more frequently and exercising 30 mins a day 5 times a week with a goal of 10-15lb weight loss in the next 3 months. Patient voiced their understanding and motivation to adhere to these recommendations.  

## 2019-10-02 NOTE — Assessment & Plan Note (Signed)
Chronic, stable.  Continue current medication regimen and adjust as needed.  Lipid panel today. 

## 2019-10-02 NOTE — Assessment & Plan Note (Signed)
Continue nightly use, praised for this.

## 2019-10-02 NOTE — Assessment & Plan Note (Signed)
Chronic, stable.  Continue daily supplement and check Vit D level today.  DEXA in upcoming months.

## 2019-10-02 NOTE — Progress Notes (Signed)
BP 127/74 (BP Location: Right Arm, Cuff Size: Normal)   Pulse 98   Temp (!) 97.4 F (36.3 C) (Oral)   Ht 5' 5.26" (1.658 m)   Wt 219 lb (99.3 kg)   LMP  (LMP Unknown)   SpO2 96%   BMI 36.16 kg/m    Subjective:    [name], female    DOB: [birth year], 74 y.o.   MRN: [id number]  HPI: Catherine Hinton is a 74 y.o. female presenting on 10/02/2019 for comprehensive medical examination. Current medical complaints include:none  She currently lives with: self Menopausal Symptoms: no   DIABETES Last A1C was 8% in January. Continues on Metformin 1000 MG twice daily, increased to this dosing last visit.  Does report improvement in diet and that she is beginning to walk more.  Hypoglycemic episodes:no Polydipsia/polyuria: no Visual disturbance: no Chest pain: no Paresthesias: no Glucose Monitoring: yes             Accucheck frequency: Daily             Fasting glucose: average 138 to 150             Post prandial:             Evening:             Before meals: Taking Insulin?: no             Long acting insulin:             Short acting insulin: Blood Pressure Monitoring: daily Retinal Examination: Up to Date Foot Exam: Up to Date Pneumovax: Up to Date Influenza: Up to Date Aspirin: yes   HYPERTENSION / HYPERLIPIDEMIA Continues on Metoprolol and Valsartan-HCTZ, ASA and Lopid.  Continues on 1000 units Vit D for Vit D deficiency.  Has CPAP and uses this nightly. Satisfied with current treatment? yes Duration of hypertension: chronic BP monitoring frequency: a few times a week BP range: 110-120/70 at home BP medication side effects: no Duration of hyperlipidemia: chronic Cholesterol medication side effects: no Cholesterol supplements: none Medication compliance: good compliance Aspirin: yes Recent stressors: no Recurrent headaches: no Visual changes: no Palpitations: no Dyspnea: no Chest pain: no Lower extremity edema: no Dizzy/lightheaded: no    GOUT Last flare was years ago.  Continues on Allopurinol.   Duration:chronic Swelling: no Redness: no Trauma: no Recent dietary change or indiscretion: no Fevers: no Nausea/vomiting: no Status:  stable  Depression Screen done today and results listed below:  Depression screen [facility] 2/9 06/24/2019 05/05/2018 04/10/2018 04/04/2017 04/25/2016  Decreased Interest 0 0 0 0 0  Down, Depressed, Hopeless 0 0 0 0 0  PHQ - 2 Score 0 0 0 0 0  Altered sleeping - 1 2 - 0  Tired, decreased energy - 1 1 - 1  Change in appetite - 2 2 - 1  Feeling bad or failure about yourself  - 0 0 - 0  Trouble concentrating - 0 0 - 0  Moving slowly or fidgety/restless - 0 0 - 0  Suicidal thoughts - 0 0 - 0  PHQ-9 Score - 4 5 - 2  Difficult doing work/chores - Not difficult at all - - -    The patient does not have a history of falls. I did not complete a risk assessment for falls. A plan of care for falls was not documented.   Past Medical History:  Past Medical History:  Diagnosis Date  . Chronic kidney disease 04/25/2015  .  Diabetes mellitus with renal manifestation ([place])   . Diabetes mellitus without complication ([place])   . Gout   . Hyperlipidemia   . Hypertension   . Hypertensive CKD (chronic kidney disease)   . Obesity   . Sleep apnea    CPAP    Surgical History:  Past Surgical History:  Procedure Laterality Date  . ABDOMINAL HYSTERECTOMY    . CHOLECYSTECTOMY    . HERNIA REPAIR    . THUMB FUSION      Medications:  Current Outpatient Medications on File Prior to Visit  Medication Sig  . aspirin 81 MG tablet Take 81 mg by mouth daily.  . Cholecalciferol (VITAMIN D-1000 MAX ST) 1000 UNITS tablet Take 1,000 Units by mouth daily.  . DHA-EPA-Flaxseed Oil-Vitamin E (THERA TEARS NUTRITION PO) Take 1,200 mg by mouth. 3 soft gels daily  . gemfibrozil (LOPID) 600 MG tablet TAKE 1 TABLET TWICE DAILY  . metFORMIN (GLUCOPHAGE) 500 MG tablet Take 2 tablets (1,000 mg total) by mouth 2 (two) times  daily.  . Multiple Vitamins-Minerals (EYE VITAMINS PO) Take 1 tablet by mouth daily.  [name] XIIDRA 5 % SOLN    No current facility-administered medications on file prior to visit.    Allergies:  Allergies  Allergen Reactions  . Niacin Itching  . Sulfa Antibiotics Other (See Comments)    Fever    Social History:  Social History   Socioeconomic History  . Marital status: Divorced    Spouse name: Not on file  . Number of children: Not on file  . Years of education: Not on file  . Highest education level: Associate degree: academic program  Occupational History  . Occupation: retired  Tobacco Use  . Smoking status: Never Smoker  . Smokeless tobacco: Never Used  Substance and Sexual Activity  . Alcohol use: No    Alcohol/week: 0.0 standard drinks  . Drug use: No  . Sexual activity: Never  Other Topics Concern  . Not on file  Social History Narrative  . Not on file   Social Determinants of Health   Financial Resource Strain:   . Difficulty of Paying Living Expenses:   Food Insecurity:   . Worried About Charity fundraiser in the Last Year:   . Arboriculturist in the Last Year:   Transportation Needs:   . Film/video editor (Medical):   [name] Lack of Transportation (Non-Medical):   Physical Activity:   . Days of Exercise per Week:   . Minutes of Exercise per Session:   Stress:   . Feeling of Stress :   Social Connections:   . Frequency of Communication with Friends and Family:   . Frequency of Social Gatherings with Friends and Family:   . Attends Religious Services:   . Active Member of Clubs or Organizations:   . Attends Archivist Meetings:   [name] Marital Status:   Intimate Partner Violence:   . Fear of Current or Ex-Partner:   . Emotionally Abused:   [name] Physically Abused:   . Sexually Abused:    Social History   Tobacco Use  Smoking Status Never Smoker  Smokeless Tobacco Never Used   Social History   Substance and Sexual Activity  Alcohol Use  No  . Alcohol/week: 0.0 standard drinks    Family History:  Family History  Problem Relation Age of Onset  . Diabetes Mother   . Cancer Mother        Pituitary tumor  . Hyperlipidemia  Mother   . Hypertension Mother   . Thyroid disease Mother   . Stroke Mother   . Cancer Father        Lung  . Cancer Brother        Oral  . Hypertension Son   . Cancer Maternal Grandmother        colon  . Blindness Maternal Grandfather   . Arthritis Paternal Grandmother   . Arthritis Daughter        RA  . Breast cancer Neg Hx     Past medical history, surgical history, medications, allergies, family history and social history reviewed with patient today and changes made to appropriate areas of the chart.   Review of Systems - negative All other ROS negative except what is listed above and in the HPI.      Objective:    BP 127/74 (BP Location: Right Arm, Cuff Size: Normal)   Pulse 98   Temp (!) 97.4 F (36.3 C) (Oral)   Ht 5' 5.26" (1.658 m)   Wt 219 lb (99.3 kg)   LMP  (LMP Unknown)   SpO2 96%   BMI 36.16 kg/m   Wt Readings from Last 3 Encounters:  10/02/19 219 lb (99.3 kg)  09/18/19 215 lb 3.2 oz (97.6 kg)  05/05/18 220 lb (99.8 kg)    Physical Exam Constitutional:      General: She is awake. She is not in acute distress.    Appearance: She is well-developed. She is not ill-appearing.  HENT:     Head: Normocephalic and atraumatic.     Right Ear: Hearing, tympanic membrane, ear canal and external ear normal. No drainage.     Left Ear: Hearing, tympanic membrane, ear canal and external ear normal. No drainage.     Nose: Nose normal.     Right Sinus: No maxillary sinus tenderness or frontal sinus tenderness.     Left Sinus: No maxillary sinus tenderness or frontal sinus tenderness.     Mouth/Throat:     Mouth: Mucous membranes are moist.     Pharynx: Oropharynx is clear. Uvula midline. No pharyngeal swelling, oropharyngeal exudate or posterior oropharyngeal erythema.  Eyes:      General: Lids are normal.        Right eye: No discharge.        Left eye: No discharge.     Extraocular Movements: Extraocular movements intact.     Conjunctiva/sclera: Conjunctivae normal.     Pupils: Pupils are equal, round, and reactive to light.     Visual Fields: Right eye visual fields normal and left eye visual fields normal.  Neck:     Thyroid: No thyromegaly.     Vascular: No carotid bruit.     Trachea: Trachea normal.  Cardiovascular:     Rate and Rhythm: Normal rate and regular rhythm.     Heart sounds: Normal heart sounds. No murmur. No gallop.   Pulmonary:     Effort: Pulmonary effort is normal. No accessory muscle usage or respiratory distress.     Breath sounds: Normal breath sounds.  Chest:     Comments: Deferred per patient request. Abdominal:     General: Bowel sounds are normal.     Palpations: Abdomen is soft. There is no hepatomegaly or splenomegaly.     Tenderness: There is no abdominal tenderness.  Musculoskeletal:        General: Normal range of motion.     Cervical back: Normal range of motion and neck  supple.     Right lower leg: No edema.     Left lower leg: No edema.  Lymphadenopathy:     Head:     Right side of head: No submental, submandibular, tonsillar, preauricular or posterior auricular adenopathy.     Left side of head: No submental, submandibular, tonsillar, preauricular or posterior auricular adenopathy.     Cervical: No cervical adenopathy.  Skin:    General: Skin is warm and dry.     Capillary Refill: Capillary refill takes less than 2 seconds.     Findings: No rash.  Neurological:     Mental Status: She is alert and oriented to person, place, and time.     Cranial Nerves: Cranial nerves are intact.     Gait: Gait is intact.     Deep Tendon Reflexes: Reflexes are normal and symmetric.     Reflex Scores:      Brachioradialis reflexes are 2+ on the right side and 2+ on the left side.      Patellar reflexes are 2+ on the right side  and 2+ on the left side. Psychiatric:        Attention and Perception: Attention normal.        Mood and Affect: Mood normal.        Speech: Speech normal.        Behavior: Behavior normal. Behavior is cooperative.        Thought Content: Thought content normal.        Judgment: Judgment normal.    Diabetic Foot Exam - Simple   Simple Foot Form Visual Inspection No deformities, no ulcerations, no other skin breakdown bilaterally: Yes Sensation Testing See comments: Yes Pulse Check See comments: Yes Comments PT and DP pulses bilaterally 1+.  Mild edema to ankles, non pitting.  Sensation right foot 7/10 and left foot 5/10.  She does endorse neuropathy pain at night keeping her awake.    Results for orders placed or performed during the hospital encounter of 09/18/19  I-STAT creatinine  Result Value Ref Range   Creatinine, Ser 0.50 0.44 - 1.00 mg/dL      Assessment & Plan:   Problem List Items Addressed This Visit      Cardiovascular and Mediastinum   Hypertension associated with diabetes ([place])    Chronic, stable with BP below goal on home readings and in office. Continue current medication regimen and adjust as needed based on BP readings.  CMP today. Could consider discontinuation of HCTZ at upcoming visit, as BP well controlled and history of gout.  Refills sent in.  Return in 3 months.      Relevant Medications   valsartan-hydrochlorothiazide (DIOVAN-HCT) 160-12.5 MG tablet   metoprolol succinate (TOPROL-XL) 25 MG 24 hr tablet   Other Relevant Orders   CBC with Differential/Platelet   Comprehensive metabolic panel   TSH   Bayer DCA Hb A1c Waived     Respiratory   Obstructive sleep apnea    Continue nightly use, praised for this.        Endocrine   Hyperlipidemia associated with type 2 diabetes mellitus (HCC)    Chronic, stable.  Continue current medication regimen and adjust as needed.  Lipid panel today.      Relevant Medications    valsartan-hydrochlorothiazide (DIOVAN-HCT) 160-12.5 MG tablet   Other Relevant Orders   Comprehensive metabolic panel   Lipid Panel w/o Chol/HDL Ratio   Bayer DCA Hb A1c Waived   Type 2 diabetes mellitus with diabetic  neuropathy, unspecified ([place])    Chronic, ongoing.  A1C 6.5% today, downward trend from previous A1C 8%.  Continue Metformin 1000 MG BID and adjust regimen as needed.  Focus on diabetic diet at home and regular exercise.  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  Script for Gabapentin 300 MG sent.  Check B12 level with chronic Metformin use.  Return in 3 months.      Relevant Medications   valsartan-hydrochlorothiazide (DIOVAN-HCT) 160-12.5 MG tablet   Other Relevant Orders   Bayer DCA Hb A1c Waived     Other   Morbid obesity ([place])    Recommended eating smaller high protein, low fat meals more frequently and exercising 30 mins a day 5 times a week with a goal of 10-15lb weight loss in the next 3 months. Patient voiced their understanding and motivation to adhere to these recommendations.       Vitamin D deficiency    Chronic, stable.  Continue daily supplement and check Vit D level today.  DEXA in upcoming months.      Relevant Orders   VITAMIN D 25 Hydroxy (Vit-D Deficiency, Fractures)   BMI 36.0-36.9,adult    With T2DM.  Recommend heavy focus on healthy diet and regular exercise regimen.      Gout    Chronic, ongoing.  Continue Allopurinol at this time, but may consider reduction in future.  Consider d/c HCTZ at next visit, since BP well controlled.       Other Visit Diagnoses    Encounter for routine history and physical exam in female    -  Primary   B12 deficiency       Check B12 level today and start supplement as needed, reports history of low level.   Relevant Orders   B12       Follow up plan: Return in about 3 months (around 01/01/2020) for T2DM, HTN/HLD.   LABORATORY TESTING:  - Pap smear:  not applicable  IMMUNIZATIONS:   - Tdap: Tetanus vaccination status reviewed: last tetanus booster within 10 years. - Influenza: Up to date - Pneumovax: Up to date - Prevnar: Up to date - HPV: Not applicable - Zostavax vaccine: Up to date  SCREENING: -Mammogram: Up to date  - Colonoscopy: will order in June  - Bone Density: will order in June  -Hearing Test: not applicable -Spirometry: Not applicable   PATIENT COUNSELING:   Advised to take 1 mg of folate supplement per day if capable of pregnancy.   Sexuality: Discussed sexually transmitted diseases, partner selection, use of condoms, avoidance of unintended pregnancy  and contraceptive alternatives.   Advised to avoid cigarette smoking.  I discussed with the patient that most people either abstain from alcohol or drink within safe limits (<=14/week and <=4 drinks/occasion for males, <=7/weeks and <= 3 drinks/occasion for females) and that the risk for alcohol disorders and other health effects rises proportionally with the number of drinks per week and how often a drinker exceeds daily limits.  Discussed cessation/primary prevention of drug use and availability of treatment for abuse.   Diet: Encouraged to adjust caloric intake to maintain  or achieve ideal body weight, to reduce intake of dietary saturated fat and total fat, to limit sodium intake by avoiding high sodium foods and not adding table salt, and to maintain adequate dietary potassium and calcium preferably from fresh fruits, vegetables, and low-fat dairy products.    stressed the importance of regular  exercise  Injury prevention: Discussed safety belts, safety helmets, smoke detector, smoking near bedding or upholstery.   Dental health: Discussed importance of regular tooth brushing, flossing, and dental visits.    NEXT PREVENTATIVE PHYSICAL DUE IN 1 YEAR. Return in about 3 months (around 01/01/2020) for T2DM, HTN/HLD.

## 2019-10-02 NOTE — Assessment & Plan Note (Signed)
Chronic, ongoing.  Continue Allopurinol at this time, but may consider reduction in future.  Consider d/c HCTZ at next visit, since BP well controlled.

## 2019-10-02 NOTE — Assessment & Plan Note (Addendum)
Chronic, stable with BP below goal on home readings and in office. Continue current medication regimen and adjust as needed based on BP readings.  CMP today. Could consider discontinuation of HCTZ at upcoming visit, as BP well controlled and history of gout.  Refills sent in.  Return in 3 months.

## 2019-10-03 LAB — CBC WITH DIFFERENTIAL/PLATELET
Basophils Absolute: 0 10*3/uL (ref 0.0–0.2)
Basos: 0 %
EOS (ABSOLUTE): 0.1 10*3/uL (ref 0.0–0.4)
Eos: 2 %
Hematocrit: 39.6 % (ref 34.0–46.6)
Hemoglobin: 13.3 g/dL (ref 11.1–15.9)
Immature Grans (Abs): 0 10*3/uL (ref 0.0–0.1)
Immature Granulocytes: 0 %
Lymphocytes Absolute: 1.8 10*3/uL (ref 0.7–3.1)
Lymphs: 34 %
MCH: 29 pg (ref 26.6–33.0)
MCHC: 33.6 g/dL (ref 31.5–35.7)
MCV: 87 fL (ref 79–97)
Monocytes Absolute: 0.4 10*3/uL (ref 0.1–0.9)
Monocytes: 8 %
Neutrophils Absolute: 3 10*3/uL (ref 1.4–7.0)
Neutrophils: 56 %
Platelets: 319 10*3/uL (ref 150–450)
RBC: 4.58 x10E6/uL (ref 3.77–5.28)
RDW: 13.6 % (ref 11.7–15.4)
WBC: 5.4 10*3/uL (ref 3.4–10.8)

## 2019-10-03 LAB — LIPID PANEL W/O CHOL/HDL RATIO
Cholesterol, Total: 130 mg/dL (ref 100–199)
HDL: 35 mg/dL — ABNORMAL LOW (ref >39)
LDL Chol Calc (NIH): 64 mg/dL (ref 0–99)
Triglycerides: 186 mg/dL — ABNORMAL HIGH (ref 0–149)
VLDL Cholesterol Cal: 31 mg/dL (ref 5–40)

## 2019-10-03 LAB — COMPREHENSIVE METABOLIC PANEL
ALT: 20 IU/L (ref 0–32)
AST: 16 IU/L (ref 0–40)
Albumin/Globulin Ratio: 2 (ref 1.2–2.2)
Albumin: 4.7 g/dL (ref 3.7–4.7)
Alkaline Phosphatase: 46 IU/L (ref 39–117)
BUN/Creatinine Ratio: 18 (ref 12–28)
BUN: 11 mg/dL (ref 8–27)
Bilirubin Total: 0.4 mg/dL (ref 0.0–1.2)
CO2: 24 mmol/L (ref 20–29)
Calcium: 9.6 mg/dL (ref 8.7–10.3)
Chloride: 100 mmol/L (ref 96–106)
Creatinine, Ser: 0.62 mg/dL (ref 0.57–1.00)
GFR calc Af Amer: 105 mL/min/{1.73_m2} (ref >59)
GFR calc non Af Amer: 91 mL/min/{1.73_m2} (ref >59)
Globulin, Total: 2.4 g/dL (ref 1.5–4.5)
Glucose: 118 mg/dL — ABNORMAL HIGH (ref 65–99)
Potassium: 4.4 mmol/L (ref 3.5–5.2)
Sodium: 141 mmol/L (ref 134–144)
Total Protein: 7.1 g/dL (ref 6.0–8.5)

## 2019-10-03 LAB — VITAMIN B12: Vitamin B-12: 222 pg/mL — ABNORMAL LOW (ref 232–1245)

## 2019-10-03 LAB — TSH: TSH: 0.306 u[IU]/mL — ABNORMAL LOW (ref 0.450–4.500)

## 2019-10-03 LAB — VITAMIN D 25 HYDROXY (VIT D DEFICIENCY, FRACTURES): Vit D, 25-Hydroxy: 43.6 ng/mL (ref 30.0–100.0)

## 2019-10-04 ENCOUNTER — Other Ambulatory Visit: Payer: Self-pay | Admitting: Nurse Practitioner

## 2019-10-04 ENCOUNTER — Encounter: Payer: Self-pay | Admitting: Nurse Practitioner

## 2019-10-04 DIAGNOSIS — E538 Deficiency of other specified B group vitamins: Secondary | ICD-10-CM | POA: Insufficient documentation

## 2019-10-04 DIAGNOSIS — R7989 Other specified abnormal findings of blood chemistry: Secondary | ICD-10-CM

## 2019-10-04 NOTE — Progress Notes (Signed)
Please let Catherine Hinton know the following about her labs: Labs have returned and the following were found: - CBC is normal, no anemia - Kidney and liver function are normal - Cholesterol levels are at good range, continue your Lopid - vitamin D is normal - Thyroid level, TSH was a little on low side, hyperthyroid, I will see if I can add a T4 to your current labs, but would like to recheck this on outpatient labs in 4 weeks, please schedule a lab visit. - B12 level is low at 222, goal is more than 300 and this may explain your nerve pain.  I would like you to start taking Vitamin B12 1000 MCG daily, which you can obtain in any vitamin section.  We will recheck level next visit. If any questions please let me know.  Have a good day!!

## 2019-11-03 ENCOUNTER — Other Ambulatory Visit: Payer: Self-pay | Admitting: Nurse Practitioner

## 2019-11-03 ENCOUNTER — Other Ambulatory Visit: Payer: Medicare Other

## 2019-11-03 ENCOUNTER — Other Ambulatory Visit: Payer: Self-pay

## 2019-11-03 DIAGNOSIS — E0590 Thyrotoxicosis, unspecified without thyrotoxic crisis or storm: Secondary | ICD-10-CM | POA: Diagnosis not present

## 2019-11-03 DIAGNOSIS — R7989 Other specified abnormal findings of blood chemistry: Secondary | ICD-10-CM

## 2019-11-04 LAB — THYROID PANEL WITH TSH
Free Thyroxine Index: 1.6 (ref 1.2–4.9)
T3 Uptake Ratio: 23 % — ABNORMAL LOW (ref 24–39)
T4, Total: 6.8 ug/dL (ref 4.5–12.0)
TSH: 0.351 u[IU]/mL — ABNORMAL LOW (ref 0.450–4.500)

## 2019-11-05 ENCOUNTER — Telehealth: Payer: Self-pay | Admitting: Nurse Practitioner

## 2019-11-05 NOTE — Telephone Encounter (Signed)
scheduled

## 2019-11-05 NOTE — Telephone Encounter (Signed)
Spoke to patient via telephone and reviewed thyroid labs, previous labs noted TSH 0.306, currently this is trending upwards are recheck to 0.351 and T4 within normal range.  She denies any symptoms of hyperthyroid.  Will plan to recheck this in 6 weeks on outpatient labs and if improvement continue to monitor, if lowering then plan to send to endocrinology.  She agrees with this plan of care.

## 2019-11-05 NOTE — Progress Notes (Signed)
Spoke to patient via telephone, review telephone note 11/05/19.

## 2019-12-17 ENCOUNTER — Other Ambulatory Visit: Payer: Self-pay

## 2019-12-17 ENCOUNTER — Other Ambulatory Visit: Payer: Medicare Other

## 2019-12-17 DIAGNOSIS — E0590 Thyrotoxicosis, unspecified without thyrotoxic crisis or storm: Secondary | ICD-10-CM

## 2019-12-17 DIAGNOSIS — R7989 Other specified abnormal findings of blood chemistry: Secondary | ICD-10-CM

## 2019-12-18 LAB — TSH: TSH: 0.53 u[IU]/mL (ref 0.450–4.500)

## 2019-12-18 NOTE — Progress Notes (Signed)
Please let [place] know her thyroid has returned to normal range and we will continue to monitor this at visits.  No other changes needed at this time.  Good news.:)

## 2019-12-25 ENCOUNTER — Ambulatory Visit (INDEPENDENT_AMBULATORY_CARE_PROVIDER_SITE_OTHER): Payer: Medicare Other

## 2019-12-25 VITALS — Ht 63.0 in | Wt 220.0 lb

## 2019-12-25 DIAGNOSIS — Z0000 Encounter for general adult medical examination without abnormal findings: Secondary | ICD-10-CM

## 2019-12-25 NOTE — Patient Instructions (Signed)
[name] , Thank you for taking time to come for your Medicare Wellness Visit. I appreciate your ongoing commitment to your health goals. Please review the following plan we discussed and let me know if I can assist you in the future.   Screening recommendations/referrals: Colonoscopy: completed 08/09/2009, due now Mammogram: completed 12/01/2018, due now Bone Density: due now Recommended yearly ophthalmology/optometry visit for glaucoma screening and checkup Recommended yearly dental visit for hygiene and checkup  Vaccinations: Influenza vaccine: completed 03/24/2019, due 01/17/2020 Pneumococcal vaccine: completed 10/26/2015 Tdap vaccine: completed 12/12/2010, due 12/11/2020 Shingles vaccine: discussed   Covid-19:07/29/2019, 08/19/2019  Advanced directives: Advance directive discussed with you today. Even though you declined this today please call our office should you change your mind and we can give you the proper paperwork for you to fill out.  Conditions/risks identified: none  Next appointment: Follow up in one year for your annual wellness visit    Preventive Care 65 Years and Older, Female Preventive care refers to lifestyle choices and visits with your health care provider that can promote health and wellness. What does preventive care include?  A yearly physical exam. This is also called an annual well check.  Dental exams once or twice a year.  Routine eye exams. Ask your health care provider how often you should have your eyes checked.  Personal lifestyle choices, including:  Daily care of your teeth and gums.  Regular physical activity.  Eating a healthy diet.  Avoiding tobacco and drug use.  Limiting alcohol use.  Practicing safe sex.  Taking low-dose aspirin every day.  Taking vitamin and mineral supplements as recommended by your health care provider. What happens during an annual well check? The services and screenings done by your health care provider  during your annual well check will depend on your age, overall health, lifestyle risk factors, and family history of disease. Counseling  Your health care provider may ask you questions about your:  Alcohol use.  Tobacco use.  Drug use.  Emotional well-being.  Home and relationship well-being.  Sexual activity.  Eating habits.  History of falls.  Memory and ability to understand (cognition).  Work and work Statistician.  Reproductive health. Screening  You may have the following tests or measurements:  Height, weight, and BMI.  Blood pressure.  Lipid and cholesterol levels. These may be checked every 5 years, or more frequently if you are over 110 years old.  Skin check.  Lung cancer screening. You may have this screening every year starting at age 18 if you have a 30-pack-year history of smoking and currently smoke or have quit within the past 15 years.  Fecal occult blood test (FOBT) of the stool. You may have this test every year starting at age 2.  Flexible sigmoidoscopy or colonoscopy. You may have a sigmoidoscopy every 5 years or a colonoscopy every 10 years starting at age 79.  Hepatitis C blood test.  Hepatitis B blood test.  Sexually transmitted disease (STD) testing.  Diabetes screening. This is done by checking your blood sugar (glucose) after you have not eaten for a while (fasting). You may have this done every 1-3 years.  Bone density scan. This is done to screen for osteoporosis. You may have this done starting at age 27.  Mammogram. This may be done every 1-2 years. Talk to your health care provider about how often you should have regular mammograms. Talk with your health care provider about your test results, treatment options, and if necessary, the  need for more tests. Vaccines  Your health care provider may recommend certain vaccines, such as:  Influenza vaccine. This is recommended every year.  Tetanus, diphtheria, and acellular pertussis  (Tdap, Td) vaccine. You may need a Td booster every 10 years.  Zoster vaccine. You may need this after age 51.  Pneumococcal 13-valent conjugate (PCV13) vaccine. One dose is recommended after age 76.  Pneumococcal polysaccharide (PPSV23) vaccine. One dose is recommended after age 45. Talk to your health care provider about which screenings and vaccines you need and how often you need them. This information is not intended to replace advice given to you by your health care provider. Make sure you discuss any questions you have with your health care provider. Document Released: 07/01/2015 Document Revised: 02/22/2016 Document Reviewed: 04/05/2015 Elsevier Interactive Patient Education  2017 [place] Prevention in the Home Falls can cause injuries. They can happen to people of all ages. There are many things you can do to make your home safe and to help prevent falls. What can I do on the outside of my home?  Regularly fix the edges of walkways and driveways and fix any cracks.  Remove anything that might make you trip as you walk through a door, such as a raised step or threshold.  Trim any bushes or trees on the path to your home.  Use bright outdoor lighting.  Clear any walking paths of anything that might make someone trip, such as rocks or tools.  Regularly check to see if handrails are loose or broken. Make sure that both sides of any steps have handrails.  Any raised decks and porches should have guardrails on the edges.  Have any leaves, snow, or ice cleared regularly.  Use sand or salt on walking paths during winter.  Clean up any spills in your garage right away. This includes oil or grease spills. What can I do in the bathroom?  Use night lights.  Install grab bars by the toilet and in the tub and shower. Do not use towel bars as grab bars.  Use non-skid mats or decals in the tub or shower.  If you need to sit down in the shower, use a plastic, non-slip  stool.  Keep the floor dry. Clean up any water that spills on the floor as soon as it happens.  Remove soap buildup in the tub or shower regularly.  Attach bath mats securely with double-sided non-slip rug tape.  Do not have throw rugs and other things on the floor that can make you trip. What can I do in the bedroom?  Use night lights.  Make sure that you have a light by your bed that is easy to reach.  Do not use any sheets or blankets that are too big for your bed. They should not hang down onto the floor.  Have a firm chair that has side arms. You can use this for support while you get dressed.  Do not have throw rugs and other things on the floor that can make you trip. What can I do in the kitchen?  Clean up any spills right away.  Avoid walking on wet floors.  Keep items that you use a lot in easy-to-reach places.  If you need to reach something above you, use a strong step stool that has a grab bar.  Keep electrical cords out of the way.  Do not use floor polish or wax that makes floors slippery. If you must use wax,  use non-skid floor wax.  Do not have throw rugs and other things on the floor that can make you trip. What can I do with my stairs?  Do not leave any items on the stairs.  Make sure that there are handrails on both sides of the stairs and use them. Fix handrails that are broken or loose. Make sure that handrails are as long as the stairways.  Check any carpeting to make sure that it is firmly attached to the stairs. Fix any carpet that is loose or worn.  Avoid having throw rugs at the top or bottom of the stairs. If you do have throw rugs, attach them to the floor with carpet tape.  Make sure that you have a light switch at the top of the stairs and the bottom of the stairs. If you do not have them, ask someone to add them for you. What else can I do to help prevent falls?  Wear shoes that:  Do not have high heels.  Have rubber bottoms.  Are  comfortable and fit you well.  Are closed at the toe. Do not wear sandals.  If you use a stepladder:  Make sure that it is fully opened. Do not climb a closed stepladder.  Make sure that both sides of the stepladder are locked into place.  Ask someone to hold it for you, if possible.  Clearly mark and make sure that you can see:  Any grab bars or handrails.  First and last steps.  Where the edge of each step is.  Use tools that help you move around (mobility aids) if they are needed. These include:  Canes.  Walkers.  Scooters.  Crutches.  Turn on the lights when you go into a dark area. Replace any light bulbs as soon as they burn out.  Set up your furniture so you have a clear path. Avoid moving your furniture around.  If any of your floors are uneven, fix them.  If there are any pets around you, be aware of where they are.  Review your medicines with your doctor. Some medicines can make you feel dizzy. This can increase your chance of falling. Ask your doctor what other things that you can do to help prevent falls. This information is not intended to replace advice given to you by your health care provider. Make sure you discuss any questions you have with your health care provider. Document Released: 03/31/2009 Document Revised: 11/10/2015 Document Reviewed: 07/09/2014 Elsevier Interactive Patient Education  2017 [employer].

## 2019-12-25 NOTE — Progress Notes (Signed)
I connected with Catherine Hinton today by telephone and verified that I am speaking with the correct person using two identifiers. Location patient: home Location provider: work Persons participating in the virtual visit: Catherine Hinton, Glenna Durand LPN.   I discussed the limitations, risks, security and privacy concerns of performing an evaluation and management service by telephone and the availability of in person appointments. I also discussed with the patient that there may be a patient responsible charge related to this service. The patient expressed understanding and verbally consented to this telephonic visit.    Interactive audio and video telecommunications were attempted between this provider and patient, however failed, due to patient having technical difficulties OR patient did not have access to video capability.  We continued and completed visit with audio only.  Vital signs may be patient stated or missing.    Subjective:   Catherine Hinton is a 74 y.o. female who presents for Medicare Annual (Subsequent) preventive examination.  Review of Systems     Cardiac Risk Factors include: advanced age (>59men, >58 women);diabetes mellitus;dyslipidemia;hypertension;obesity (BMI >30kg/m2);sedentary lifestyle     Objective:    Today's Vitals   12/25/19 1026 12/25/19 1027  Weight: 220 lb (99.8 kg)   Height: 5\' 3"  (1.6 m)   PainSc:  2    Body mass index is 38.97 kg/m.  Advanced Directives 12/25/2019 04/10/2018 04/04/2017 04/25/2016 04/25/2015 04/25/2015 04/25/2015  Does Patient Have a Medical Advance Directive? No Yes Yes Yes - Yes Yes  Type of Advance Directive - [place];Living will [place];Living will - - - [place];Living will  Does patient want to make changes to medical advance directive? - (No Data) - - - Yes - information given -  Copy of [place] in Chart? - - No - copy requested - - No -  copy requested -  Would patient like information on creating a medical advance directive? No - Patient declined - - - - No - patient declined information -  Pre-existing out of facility DNR order (yellow form or pink MOST form) - - - - Yellow form placed in chart (order not valid for inpatient use) - -    Current Medications (verified) Outpatient Encounter Medications as of 12/25/2019  Medication Sig  . allopurinol (ZYLOPRIM) 300 MG tablet Take 1 tablet (300 mg total) by mouth daily.  [name] aspirin 81 MG tablet Take 81 mg by mouth daily.  . Cholecalciferol (VITAMIN D-1000 MAX ST) 1000 UNITS tablet Take 1,000 Units by mouth daily.  . DHA-EPA-Flaxseed Oil-Vitamin E (THERA TEARS NUTRITION PO) Take 1,200 mg by mouth. 3 soft gels daily  . gabapentin (NEURONTIN) 300 MG capsule Take 1 capsule (300 mg total) by mouth 3 (three) times daily.  [name] gemfibrozil (LOPID) 600 MG tablet TAKE 1 TABLET TWICE DAILY  . metFORMIN (GLUCOPHAGE) 500 MG tablet Take 2 tablets (1,000 mg total) by mouth 2 (two) times daily.  . metoprolol succinate (TOPROL-XL) 25 MG 24 hr tablet Take 1 tablet (25 mg total) by mouth daily.  . Multiple Vitamins-Minerals (EYE VITAMINS PO) Take 1 tablet by mouth daily.  . valsartan-hydrochlorothiazide (DIOVAN-HCT) 160-12.5 MG tablet Take 1 tablet by mouth daily.  [name] XIIDRA 5 % SOLN    No facility-administered encounter medications on file as of 12/25/2019.    Allergies (verified) Niacin and Sulfa antibiotics   History: Past Medical History:  Diagnosis Date  . Chronic kidney disease 04/25/2015  . 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  Diabetes mellitus without complication ([place])   . Gout   . Hyperlipidemia   . Hypertension   . Hypertensive CKD (chronic kidney disease)   . Obesity   . Sleep apnea    CPAP   Past Surgical History:  Procedure Laterality Date  . ABDOMINAL HYSTERECTOMY    . CHOLECYSTECTOMY    . HERNIA REPAIR    . THUMB FUSION     Family History  Problem  Relation Age of Onset  . Diabetes Mother   . Cancer Mother        Pituitary tumor  . Hyperlipidemia Mother   . Hypertension Mother   . Thyroid disease Mother   . Stroke Mother   . Cancer Father        Lung  . Cancer Brother        Oral  . Hypertension Son   . Cancer Maternal Grandmother        colon  . Blindness Maternal Grandfather   . Arthritis Paternal Grandmother   . Arthritis Daughter        RA  . Breast cancer Neg Hx    Social History   Socioeconomic History  . Marital status: Divorced    Spouse name: Not on file  . Number of children: Not on file  . Years of education: Not on file  . Highest education level: Associate degree: academic program  Occupational History  . Occupation: retired  Tobacco Use  . Smoking status: Never Smoker  . Smokeless tobacco: Never Used  Vaping Use  . Vaping Use: Never used  Substance and Sexual Activity  . Alcohol use: No    Alcohol/week: 0.0 standard drinks  . Drug use: No  . Sexual activity: Not Currently  Other Topics Concern  . Not on file  Social History Narrative  . Not on file   Social Determinants of Health   Financial Resource Strain: Low Risk   . Difficulty of Paying Living Expenses: Not hard at all  Food Insecurity: No Food Insecurity  . Worried About Charity fundraiser in the Last Year: Never true  . Ran Out of Food in the Last Year: Never true  Transportation Needs: No Transportation Needs  . Lack of Transportation (Medical): No  . Lack of Transportation (Non-Medical): No  Physical Activity: Inactive  . Days of Exercise per Week: 0 days  . Minutes of Exercise per Session: 0 min  Stress: No Stress Concern Present  . Feeling of Stress : Not at all  Social Connections:   . Frequency of Communication with Friends and Family:   . Frequency of Social Gatherings with Friends and Family:   . Attends Religious Services:   . Active Member of Clubs or Organizations:   . Attends Archivist Meetings:   [name]  Marital Status:     Tobacco Counseling Counseling given: Not Answered   Clinical Intake:  Pre-visit preparation completed: Yes  Pain : 0-10 Pain Score: 2  Pain Type: Chronic pain Pain Location: Back (foot pain) Pain Orientation: Lower Pain Descriptors / Indicators: Tingling, Aching Pain Onset: More than a month ago Pain Frequency: Intermittent Pain Relieving Factors: movement tends to help  Pain Relieving Factors: movement tends to help  Nutritional Status: BMI > 30  Obese Nutritional Risks: None Diabetes: Yes  How often do you need to have someone help you when you read instructions, pamphlets, or other written materials from your doctor or pharmacy?: 1 - Never What is the last grade level  you completed in school?: associate degree  Diabetic? Yes Nutrition Risk Assessment:  Has the patient had any N/V/D within the last 2 months?  No  Does the patient have any non-healing wounds?  No  Has the patient had any unintentional weight loss or weight gain?  no  Diabetes:  Is the patient diabetic?  Yes  If diabetic, was a CBG obtained today?  No  Did the patient bring in their glucometer from home?  No  How often do you monitor your CBG's? daily.   Financial Strains and Diabetes Management:  Are you having any financial strains with the device, your supplies or your medication? No .  Does the patient want to be seen by Chronic Care Management for management of their diabetes?  No  Would the patient like to be referred to a Nutritionist or for Diabetic Management?  No   Diabetic Exams:  Diabetic Eye Exam: Completed 08/25/2019 Diabetic Foot Exam: Completed 10/02/2019   Interpreter Needed?: No  Information entered by :: NAllen LPN   Activities of Daily Living In your present state of health, do you have any difficulty performing the following activities: 12/25/2019 06/24/2019  Hearing? N N  Vision? N N  Difficulty concentrating or making decisions? N N  Walking or  climbing stairs? Y N  Comment some -  Dressing or bathing? N N  Doing errands, shopping? N N  Preparing Food and eating ? N -  Using the Toilet? N -  In the past six months, have you accidently leaked urine? Y -  Do you have problems with loss of bowel control? N -  Managing your Medications? N -  Managing your Finances? N -  Housekeeping or managing your Housekeeping? N -  Some recent data might be hidden    Patient Care Team: [name], NP as PCP - General (Nurse Practitioner) Anell Barr, OD (Optometry)  Indicate any recent Medical Services you may have received from other than [facility] providers in the past year (date may be approximate).     Assessment:   This is a routine wellness examination for Catherine Hinton.  Hearing/Vision screen  Hearing Screening   125Hz  250Hz  500Hz  1000Hz  2000Hz  3000Hz  4000Hz  6000Hz  8000Hz   Right ear:           Left ear:           Vision Screening Comments: Regular eye exams, Dr. [name]  Dietary issues and exercise activities discussed: Current Exercise Habits: The patient does not participate in regular exercise at present  Goals    . Exercise 150 minutes per week (moderate activity)    . Patient Stated     12/25/2019, wants to eat better and move around more      Depression Screen PHQ 2/9 Scores 12/25/2019 06/24/2019 05/05/2018 04/10/2018 04/04/2017 04/25/2016 04/25/2015  PHQ - 2 Score 0 0 0 0 0 0 0  PHQ- 9 Score 7 - 4 5 - 2 -    Fall Risk Fall Risk  12/25/2019 03/09/2019 05/02/2018 04/10/2018 04/04/2017  Falls in the past year? 1 1 1  Yes No  Comment tripped over curb - [place] Telephone Survey: data to providers prior to load - -  Number falls in past yr: 0 0 1 1 -  Comment - - Emmi Telephone Survey Actual Response = 1 - -  Injury with Fall? 1 1 0 No -  Comment scrapped knee got an infection - - - -  Risk for fall due to : History  of fall(s);Medication side effect;Impaired balance/gait - - - -  Follow up Falls evaluation  completed;Education provided;Falls prevention discussed Falls evaluation completed - Falls evaluation completed -    Any stairs in or around the home? Yes  If so, are there any without handrails? Yes  Home free of loose throw rugs in walkways, pet beds, electrical cords, etc? Yes  Adequate lighting in your home to reduce risk of falls? Yes   ASSISTIVE DEVICES UTILIZED TO PREVENT FALLS:  Life alert? No  Use of a cane, walker or w/c? No  Grab bars in the bathroom? No  Shower chair or bench in shower? No  Elevated toilet seat or a handicapped toilet? No   TIMED UP AND GO:  Was the test performed? No   Cognitive Function:     6CIT Screen 12/25/2019 04/10/2018 04/04/2017  What Year? 0 points 0 points 0 points  What month? 0 points 0 points 0 points  What time? 0 points 0 points 0 points  Count back from 20 2 points 0 points 0 points  Months in reverse 0 points 0 points 0 points  Repeat phrase 0 points 0 points 0 points  Total Score 2 0 0    Immunizations Immunization History  Administered Date(s) Administered  . Fluad Quad(high Dose 65+) 03/24/2019  . Influenza, High Dose Seasonal PF 04/25/2016, 04/04/2017, 02/18/2018  . Influenza,inj,Quad PF,6+ Mos 04/25/2015  . Influenza-Unspecified 04/18/2012  . PFIZER SARS-COV-2 Vaccination 07/29/2019, 08/19/2019  . Pneumococcal Conjugate-13 04/20/2014  . Pneumococcal Polysaccharide-23 10/26/2015  . Pneumococcal-Unspecified 06/18/2006  . Tdap 12/12/2010  . Zoster 09/21/2013    TDAP status: Up to date Flu Vaccine status: Up to date Pneumococcal vaccine status: Up to date Covid-19 vaccine status: Completed vaccines  Qualifies for Shingles Vaccine? Yes   Zostavax completed Yes   Shingrix Completed?: No.    Education has been provided regarding the importance of this vaccine. Patient has been advised to call insurance company to determine out of pocket expense if they have not yet received this vaccine. Advised may also receive vaccine  at local pharmacy or Health Dept. Verbalized acceptance and understanding.  Screening Tests Health Maintenance  Topic Date Due  . DEXA SCAN  02/03/2013  . MAMMOGRAM  06/02/2019  . COLONOSCOPY  08/10/2019  . INFLUENZA VACCINE  01/17/2020  . HEMOGLOBIN A1C  04/02/2020  . OPHTHALMOLOGY EXAM  08/24/2020  . FOOT EXAM  10/01/2020  . TETANUS/TDAP  12/11/2020  . COVID-19 Vaccine  Completed  . Hepatitis C Screening  Completed  . PNA vac Low Risk Adult  Completed    Health Maintenance  Health Maintenance Due  Topic Date Due  . DEXA SCAN  02/03/2013  . MAMMOGRAM  06/02/2019  . COLONOSCOPY  08/10/2019    Colorectal cancer screening: To be scheduled by Henrine Screws Mammogram status: To be scheduled by [name] Bone Density status: To be scheduled by [name]  Lung Cancer Screening: (Low Dose CT Chest recommended if Age 18-80 years, 30 pack-year currently smoking OR have quit w/in 15years.) does not qualify.   Lung Cancer Screening Referral: no  Additional Screening:  Hepatitis C Screening: does qualify; Completed 04/25/2015  Vision Screening: Recommended annual ophthalmology exams for early detection of glaucoma and other disorders of the eye. Is the patient up to date with their annual eye exam?  Yes  Who is the provider or what is the name of the office in which the patient attends annual eye exams? Dr. [name] If pt is not established with a  provider, would they like to be referred to a provider to establish care? No .   Dental Screening: Recommended annual dental exams for proper oral hygiene  Community Resource Referral / Chronic Care Management: CRR required this visit?  No   CCM required this visit?  No      Plan:     I have personally reviewed and noted the following in the patient's chart:   . Medical and social history . Use of alcohol, tobacco or illicit drugs  . Current medications and supplements . Functional ability and status . Nutritional status . Physical  activity . Advanced directives . List of other physicians . Hospitalizations, surgeries, and ER visits in previous 12 months . Vitals . Screenings to include cognitive, depression, and falls . Referrals and appointments  In addition, I have reviewed and discussed with patient certain preventive protocols, quality metrics, and best practice recommendations. A written personalized care plan for preventive services as well as general preventive health recommendations were provided to patient.     Kellie Simmering, LPN   06/26/7586   Nurse Notes: Patient states that Henrine Screws is going to schedule mammogram, colonoscopy and bone density at next visit.

## 2019-12-29 ENCOUNTER — Telehealth: Payer: Self-pay | Admitting: Nurse Practitioner

## 2019-12-29 NOTE — Chronic Care Management (AMB) (Signed)
  Chronic Care Management   Note  12/29/2019 Name: Catherine Hinton MRN: [id number] DOB: Dec 18, 1945  Catherine Hinton is a 74 y.o. year old female who is a primary care patient of Cannady, Barbaraann Faster, NP. I reached out to [place] by phone today in response to a referral sent by [name]'s health plan.     [name] was given information about Chronic Care Management services today including:  1. CCM service includes personalized support from designated clinical staff supervised by her physician, including individualized plan of care and coordination with other care providers 2. 24/7 contact phone numbers for assistance for urgent and routine care needs. 3. Service will only be billed when office clinical staff spend 20 minutes or more in a month to coordinate care. 4. Only one practitioner may furnish and bill the service in a calendar month. 5. The patient may stop CCM services at any time (effective at the end of the month) by phone call to the office staff. 6. The patient will be responsible for cost sharing (co-pay) of up to 20% of the service fee (after annual deductible is met).  Patient agreed to services and verbal consent obtained.   Follow up plan: Telephone appointment with care management team member scheduled for:12/31/2019  Noreene Larsson, [place], [place], [place] [address] Direct Dial: [phone] [email or web address]: [email or web address]

## 2019-12-31 ENCOUNTER — Ambulatory Visit: Payer: Medicare Other

## 2019-12-31 DIAGNOSIS — E1140 Type 2 diabetes mellitus with diabetic neuropathy, unspecified: Secondary | ICD-10-CM

## 2019-12-31 DIAGNOSIS — I152 Hypertension secondary to endocrine disorders: Secondary | ICD-10-CM

## 2019-12-31 NOTE — Patient Instructions (Addendum)
Visit Information  It was a pleasure speaking with you today! Thank you for letting me be a part of your care team. Please call with any questions or concerns.  Goals Addressed            This Visit's Progress   . Chronic Care Management -PharmD       CARE PLAN ENTRY (see longitudinal plan of care for additional care plan information)  Current Barriers:  . Chronic Disease Management support, education, and care coordination needs related to Hypertension, Hyperlipidemia, Diabetes, Gout, and Vitamin D and B12 Deficiency   Hypertension BP Readings from Last 3 Encounters:  10/02/19 127/74  09/18/19 115/75  06/24/19 105/73   . Pharmacist Clinical Goal(s): o Over the next 90 days, patient will work with PharmD and providers to maintain BP goal <130/80 . Current regimen:  Catherine Hinton 160-12.5mg  qd . Metoprolol succinate 25 mg qd . Patient self care activities - Over the next 90 days, patient will: o Check BP daily document, and provide at future appointments o Ensure daily salt intake < 2300 mg/day  Hyperlipidemia Lab Results  Component Value Date/Time   LDLCALC 64 10/02/2019 10:43 AM   . Pharmacist Clinical Goal(s): o Over the next 90 days, patient will work with PharmD and providers to maintain LDL goal < 70 . Current regimen:  . Aspirin 81 mg daily . Gemfibrozil 600mg  bid . DHA-EPA-Flaxseed oil- Vit E 1200 mg qd . Interventions: o Will discuss with [name] possibly discontinuing aspirin and changing gemfibrozil to another agent depending on labs. . Patient self care activities - Over the next 90 days, patient will: o Continue eating a healty diet, increased vegetables and limited trans and saturated fats and sodium. o Increase exercise as tolerated  Diabetes Lab Results  Component Value Date/Time   HGBA1C 6.5 10/02/2019 10:41 AM   HGBA1C 8.0 (H) 07/10/2019 09:07 AM   HGBA1C 6.8 11/06/2017 12:00 AM   . Pharmacist Clinical Goal(s): o Over the next 90 days,  patient will work with PharmD and providers to maintain A1c goal <7% . Current regimen:  . Metformin 1000 mg twice daily(takes 2 500mg  ) . Gabapentin 300 mg three times daily o  . Interventions:  Will discuss with [name] increasing gabapentin dose for greater relief of foot tingling. . Patient self care activities - Over the next 90 days, patient will: o Check blood sugar in the morning before eating or drinking, document, and provide at future appointments o Contact provider with any episodes of hypoglycemia  . Medication management . Pharmacist Clinical Goal(s): o Over the next 90 days, patient will work with PharmD and providers to maintain optimal medication adherence . Current pharmacy: [facility] mail order . Interventions o Comprehensive medication review performed. o Continue current medication management strategy . Patient self care activities - Over the next 90 days, patient will: o Focus on medication adherence by continued use of pill box. o Take medications as prescribed o Report any questions or concerns to PharmD and/or provider(s)  Initial goal documentation        Catherine Hinton was given information about Chronic Care Management services today including:  1. CCM service includes personalized support from designated clinical staff supervised by her physician, including individualized plan of care and coordination with other care providers 2. 24/7 contact phone numbers for assistance for urgent and routine care needs. 3. Standard insurance, coinsurance, copays and deductibles apply for chronic care management only during months in which we provide at least 20 minutes  of these services. Most insurances cover these services at 100%, however patients may be responsible for any copay, coinsurance and/or deductible if applicable. This service may help you avoid the need for more expensive face-to-face services. 4. Only one practitioner may furnish and bill the service in a  calendar month. 5. The patient may stop CCM services at any time (effective at the end of the month) by phone call to the office staff.  Patient agreed to services and verbal consent obtained.   Print copy of patient instructions provided.  Telephone follow up appointment with pharmacy team member scheduled for:  [name]. [name] PharmD, BCPS Clinical Pharmacist [facility] [phone]  DASH Eating Plan DASH stands for "Dietary Approaches to Stop Hypertension." The DASH eating plan is a healthy eating plan that has been shown to reduce high blood pressure (hypertension). It may also reduce your risk for type 2 diabetes, heart disease, and stroke. The DASH eating plan may also help with weight loss. What are tips for following this plan?  General guidelines  Avoid eating more than 2,300 mg (milligrams) of salt (sodium) a day. If you have hypertension, you may need to reduce your sodium intake to 1,500 mg a day.  Limit alcohol intake to no more than 1 drink a day for nonpregnant women and 2 drinks a day for men. One drink equals 12 oz of beer, 5 oz of wine, or 1 oz of hard liquor.  Work with your health care provider to maintain a healthy body weight or to lose weight. Ask what an ideal weight is for you.  Get at least 30 minutes of exercise that causes your heart to beat faster (aerobic exercise) most days of the week. Activities may include walking, swimming, or biking.  Work with your health care provider or diet and nutrition specialist (dietitian) to adjust your eating plan to your individual calorie needs. Reading food labels   Check food labels for the amount of sodium per serving. Choose foods with less than 5 percent of the Daily Value of sodium. Generally, foods with less than 300 mg of sodium per serving fit into this eating plan.  To find whole grains, look for the word "whole" as the first word in the ingredient list. Shopping  Buy products labeled as  "low-sodium" or "no salt added."  Buy fresh foods. Avoid canned foods and premade or frozen meals. Cooking  Avoid adding salt when cooking. Use salt-free seasonings or herbs instead of table salt or sea salt. Check with your health care provider or pharmacist before using salt substitutes.  Do not fry foods. Cook foods using healthy methods such as baking, boiling, grilling, and broiling instead.  Cook with heart-healthy oils, such as olive, canola, soybean, or sunflower oil. Meal planning  Eat a balanced diet that includes: ? 5 or more servings of fruits and vegetables each day. At each meal, try to fill half of your plate with fruits and vegetables. ? Up to 6-8 servings of whole grains each day. ? Less than 6 oz of lean meat, poultry, or fish each day. A 3-oz serving of meat is about the same size as a deck of cards. One egg equals 1 oz. ? 2 servings of low-fat dairy each day. ? A serving of nuts, seeds, or beans 5 times each week. ? Heart-healthy fats. Healthy fats called Omega-3 fatty acids are found in foods such as flaxseeds and coldwater fish, like sardines, salmon, and mackerel.  Limit how much you  eat of the following: ? Canned or prepackaged foods. ? Food that is high in trans fat, such as fried foods. ? Food that is high in saturated fat, such as fatty meat. ? Sweets, desserts, sugary drinks, and other foods with added sugar. ? Full-fat dairy products.  Do not salt foods before eating.  Try to eat at least 2 vegetarian meals each week.  Eat more home-cooked food and less restaurant, buffet, and fast food.  When eating at a restaurant, ask that your food be prepared with less salt or no salt, if possible. What foods are recommended? The items listed may not be a complete list. Talk with your dietitian about what dietary choices are best for you. Grains Whole-grain or whole-wheat bread. Whole-grain or whole-wheat pasta. Brown rice. [place]row. Bulgur. Whole-grain  and low-sodium cereals. Pita bread. Low-fat, low-sodium crackers. Whole-wheat flour tortillas. Vegetables Fresh or frozen vegetables (raw, steamed, roasted, or grilled). Low-sodium or reduced-sodium tomato and vegetable juice. Low-sodium or reduced-sodium tomato sauce and tomato paste. Low-sodium or reduced-sodium canned vegetables. Fruits All fresh, dried, or frozen fruit. Canned fruit in natural juice (without added sugar). Meat and other protein foods Skinless chicken or [place]. Ground chicken or [place]. Pork with fat trimmed off. Fish and seafood. Egg whites. Dried beans, peas, or lentils. Unsalted nuts, nut butters, and seeds. Unsalted canned beans. Lean cuts of beef with fat trimmed off. Low-sodium, lean deli meat. Dairy Low-fat (1%) or fat-free (skim) milk. Fat-free, low-fat, or reduced-fat cheeses. Nonfat, low-sodium ricotta or cottage cheese. Low-fat or nonfat yogurt. Low-fat, low-sodium cheese. Fats and oils Soft margarine without trans fats. Vegetable oil. Low-fat, reduced-fat, or light mayonnaise and salad dressings (reduced-sodium). Canola, safflower, olive, soybean, and sunflower oils. Avocado. Seasoning and other foods Herbs. Spices. Seasoning mixes without salt. Unsalted popcorn and pretzels. Fat-free sweets. What foods are not recommended? The items listed may not be a complete list. Talk with your dietitian about what dietary choices are best for you. Grains Baked goods made with fat, such as croissants, muffins, or some breads. Dry pasta or rice meal packs. Vegetables Creamed or fried vegetables. Vegetables in a cheese sauce. Regular canned vegetables (not low-sodium or reduced-sodium). Regular canned tomato sauce and paste (not low-sodium or reduced-sodium). Regular tomato and vegetable juice (not low-sodium or reduced-sodium). Angie Fava. Olives. Fruits Canned fruit in a light or heavy syrup. Fried fruit. Fruit in cream or butter sauce. Meat and other protein foods Fatty cuts  of meat. Ribs. Fried meat. Berniece Salines. Sausage. Bologna and other processed lunch meats. Salami. Fatback. Hotdogs. Bratwurst. Salted nuts and seeds. Canned beans with added salt. Canned or smoked fish. Whole eggs or egg yolks. Chicken or [place] with skin. Dairy Whole or 2% milk, cream, and half-and-half. Whole or full-fat cream cheese. Whole-fat or sweetened yogurt. Full-fat cheese. Nondairy creamers. Whipped toppings. Processed cheese and cheese spreads. Fats and oils Butter. Stick margarine. Lard. Shortening. Ghee. Bacon fat. Tropical oils, such as coconut, palm kernel, or palm oil. Seasoning and other foods Salted popcorn and pretzels. Onion salt, garlic salt, seasoned salt, table salt, and sea salt. Worcestershire sauce. Tartar sauce. Barbecue sauce. Teriyaki sauce. Soy sauce, including reduced-sodium. Steak sauce. Canned and packaged gravies. Fish sauce. Oyster sauce. Cocktail sauce. Horseradish that you find on the shelf. Ketchup. Mustard. Meat flavorings and tenderizers. Bouillon cubes. Hot sauce and Tabasco sauce. Premade or packaged marinades. Premade or packaged taco seasonings. Relishes. Regular salad dressings. Where to find more information:  National Heart, Lung, and Blood Institute: https://wilson-eaton.com/  American  Heart Association: www.heart.org Summary  The DASH eating plan is a healthy eating plan that has been shown to reduce high blood pressure (hypertension). It may also reduce your risk for type 2 diabetes, heart disease, and stroke.  With the DASH eating plan, you should limit salt (sodium) intake to 2,300 mg a day. If you have hypertension, you may need to reduce your sodium intake to 1,500 mg a day.  When on the DASH eating plan, aim to eat more fresh fruits and vegetables, whole grains, lean proteins, low-fat dairy, and heart-healthy fats.  Work with your health care provider or diet and nutrition specialist (dietitian) to adjust your eating plan to your individual calorie  needs. This information is not intended to replace advice given to you by your health care provider. Make sure you discuss any questions you have with your health care provider. Document Revised: 05/17/2017 Document Reviewed: 05/28/2016 Elsevier Patient Education  2020 [employer].  Diabetes Mellitus and Exercise Exercising regularly is important for your overall health, especially when you have diabetes (diabetes mellitus). Exercising is not only about losing weight. It has many other health benefits, such as increasing muscle strength and bone density and reducing body fat and stress. This leads to improved fitness, flexibility, and endurance, all of which result in better overall health. Exercise has additional benefits for people with diabetes, including:  Reducing appetite.  Helping to lower and control blood glucose.  Lowering blood pressure.  Helping to control amounts of fatty substances (lipids) in the blood, such as cholesterol and triglycerides.  Helping the body to respond better to insulin (improving insulin sensitivity).  Reducing how much insulin the body needs.  Decreasing the risk for heart disease by: ? Lowering cholesterol and triglyceride levels. ? Increasing the levels of good cholesterol. ? Lowering blood glucose levels. What is my activity plan? Your health care provider or certified diabetes educator can help you make a plan for the type and frequency of exercise (activity plan) that works for you. Make sure that you:  Do at least 150 minutes of moderate-intensity or vigorous-intensity exercise each week. This could be brisk walking, biking, or water aerobics. ? Do stretching and strength exercises, such as yoga or weightlifting, at least 2 times a week. ? Spread out your activity over at least 3 days of the week.  Get some form of physical activity every day. ? Do not go more than 2 days in a row without some kind of physical activity. ? Avoid being  inactive for more than 30 minutes at a time. Take frequent breaks to walk or stretch.  Choose a type of exercise or activity that you enjoy, and set realistic goals.  Start slowly, and gradually increase the intensity of your exercise over time. What do I need to know about managing my diabetes?   Check your blood glucose before and after exercising. ? If your blood glucose is 240 mg/dL (13.3 mmol/L) or higher before you exercise, check your urine for ketones. If you have ketones in your urine, do not exercise until your blood glucose returns to normal. ? If your blood glucose is 100 mg/dL (5.6 mmol/L) or lower, eat a snack containing 15-20 grams of carbohydrate. Check your blood glucose 15 minutes after the snack to make sure that your level is above 100 mg/dL (5.6 mmol/L) before you start your exercise.  Know the symptoms of low blood glucose (hypoglycemia) and how to treat it. Your risk for hypoglycemia increases during and after exercise.  Common symptoms of hypoglycemia can include: ? Hunger. ? Anxiety. ? Sweating and feeling clammy. ? Confusion. ? Dizziness or feeling light-headed. ? Increased heart rate or palpitations. ? Blurry vision. ? Tingling or numbness around the mouth, lips, or tongue. ? Tremors or shakes. ? Irritability.  Keep a rapid-acting carbohydrate snack available before, during, and after exercise to help prevent or treat hypoglycemia.  Avoid injecting insulin into areas of the body that are going to be exercised. For example, avoid injecting insulin into: ? The arms, when playing tennis. ? The legs, when jogging.  Keep records of your exercise habits. Doing this can help you and your health care provider adjust your diabetes management plan as needed. Write down: ? Food that you eat before and after you exercise. ? Blood glucose levels before and after you exercise. ? The type and amount of exercise you have done. ? When your insulin is expected to peak, if  you use insulin. Avoid exercising at times when your insulin is peaking.  When you start a new exercise or activity, work with your health care provider to make sure the activity is safe for you, and to adjust your insulin, medicines, or food intake as needed.  Drink plenty of water while you exercise to prevent dehydration or heat stroke. Drink enough fluid to keep your urine clear or pale yellow. Summary  Exercising regularly is important for your overall health, especially when you have diabetes (diabetes mellitus).  Exercising has many health benefits, such as increasing muscle strength and bone density and reducing body fat and stress.  Your health care provider or certified diabetes educator can help you make a plan for the type and frequency of exercise (activity plan) that works for you.  When you start a new exercise or activity, work with your health care provider to make sure the activity is safe for you, and to adjust your insulin, medicines, or food intake as needed. This information is not intended to replace advice given to you by your health care provider. Make sure you discuss any questions you have with your health care provider. Document Revised: 12/27/2016 Document Reviewed: 11/14/2015 Elsevier Patient Education  [place].  Peripheral Neuropathy Peripheral neuropathy is a type of nerve damage. It affects nerves that carry signals between the spinal cord and the arms, legs, and the rest of the body (peripheral nerves). It does not affect nerves in the spinal cord or brain. In peripheral neuropathy, one nerve or a group of nerves may be damaged. Peripheral neuropathy is a broad category that includes many specific nerve disorders, like diabetic neuropathy, hereditary neuropathy, and carpal tunnel syndrome. What are the causes? This condition may be caused by:  Diabetes. This is the most common cause of peripheral neuropathy.  Nerve injury.  Pressure or stress on  a nerve that lasts a long time.  Lack (deficiency) of B vitamins. This can result from alcoholism, poor diet, or a restricted diet.  Infections.  Autoimmune diseases, such as rheumatoid arthritis and systemic lupus erythematosus.  Nerve diseases that are passed from parent to child (inherited).  Some medicines, such as cancer medicines (chemotherapy).  Poisonous (toxic) substances, such as lead and mercury.  Too little blood flowing to the legs.  Kidney disease.  Thyroid disease. In some cases, the cause of this condition is not known. What are the signs or symptoms? Symptoms of this condition depend on which of your nerves is damaged. Common symptoms include:  Loss of feeling (numbness)  in the feet, hands, or both.  Tingling in the feet, hands, or both.  Burning pain.  Very sensitive skin.  Weakness.  Not being able to move a part of the body (paralysis).  Muscle twitching.  Clumsiness or poor coordination.  Loss of balance.  Not being able to control your bladder.  Feeling dizzy.  Sexual problems. How is this diagnosed? Diagnosing and finding the cause of peripheral neuropathy can be difficult. Your health care provider will take your medical history and do a physical exam. A neurological exam will also be done. This involves checking things that are affected by your brain, spinal cord, and nerves (nervous system). For example, your health care provider will check your reflexes, how you move, and what you can feel. You may have other tests, such as:  Blood tests.  Electromyogram (EMG) and nerve conduction tests. These tests check nerve function and how well the nerves are controlling the muscles.  Imaging tests, such as CT scans or MRI to rule out other causes of your symptoms.  Removing a small piece of nerve to be examined in a lab (nerve biopsy). This is rare.  Removing and examining a small amount of the fluid that surrounds the brain and spinal cord  (lumbar puncture). This is rare. How is this treated? Treatment for this condition may involve:  Treating the underlying cause of the neuropathy, such as diabetes, kidney disease, or vitamin deficiencies.  Stopping medicines that can cause neuropathy, such as chemotherapy.  Medicine to relieve pain. Medicines may include: ? Prescription or over-the-counter pain medicine. ? Antiseizure medicine. ? Antidepressants. ? Pain-relieving patches that are applied to painful areas of skin.  Surgery to relieve pressure on a nerve or to destroy a nerve that is causing pain.  Physical therapy to help improve movement and balance.  Devices to help you move around (assistive devices). Follow these instructions at home: Medicines  Take over-the-counter and prescription medicines only as told by your health care provider. Do not take any other medicines without first asking your health care provider.  Do not drive or use heavy machinery while taking prescription pain medicine. Lifestyle   Do not use any products that contain nicotine or tobacco, such as cigarettes and e-cigarettes. Smoking keeps blood from reaching damaged nerves. If you need help quitting, ask your health care provider.  Avoid or limit alcohol. Too much alcohol can cause a vitamin B deficiency, and vitamin B is needed for healthy nerves.  Eat a healthy diet. This includes: ? Eating foods that are high in fiber, such as fresh fruits and vegetables, whole grains, and beans. ? Limiting foods that are high in fat and processed sugars, such as fried or sweet foods. General instructions   If you have diabetes, work closely with your health care provider to keep your blood sugar under control.  If you have numbness in your feet: ? Check every day for signs of injury or infection. Watch for redness, warmth, and swelling. ? Wear padded socks and comfortable shoes. These help protect your feet.  Develop a good support system.  Living with peripheral neuropathy can be stressful. Consider talking with a mental health specialist or joining a support group.  Use assistive devices and attend physical therapy as told by your health care provider. This may include using a walker or a cane.  Keep all follow-up visits as told by your health care provider. This is important. Contact a health care provider if:  You have new signs  or symptoms of peripheral neuropathy.  You are struggling emotionally from dealing with peripheral neuropathy.  Your pain is not well-controlled. Get help right away if:  You have an injury or infection that is not healing normally.  You develop new weakness in an arm or leg.  You fall frequently. Summary  Peripheral neuropathy is when the nerves in the arms, or legs are damaged, resulting in numbness, weakness, or pain.  There are many causes of peripheral neuropathy, including diabetes, pinched nerves, vitamin deficiencies, autoimmune disease, and hereditary conditions.  Diagnosing and finding the cause of peripheral neuropathy can be difficult. Your health care provider will take your medical history, do a physical exam, and do tests, including blood tests and nerve function tests.  Treatment involves treating the underlying cause of the neuropathy and taking medicines to help control pain. Physical therapy and assistive devices may also help. This information is not intended to replace advice given to you by your health care provider. Make sure you discuss any questions you have with your health care provider. Document Revised: 05/17/2017 Document Reviewed: 08/13/2016 Elsevier Patient Education  [place].  Peripheral Neuropathy Peripheral neuropathy is a type of nerve damage. It affects nerves that carry signals between the spinal cord and the arms, legs, and the rest of the body (peripheral nerves). It does not affect nerves in the spinal cord or brain. In peripheral  neuropathy, one nerve or a group of nerves may be damaged. Peripheral neuropathy is a broad category that includes many specific nerve disorders, like diabetic neuropathy, hereditary neuropathy, and carpal tunnel syndrome. What are the causes? This condition may be caused by:  Diabetes. This is the most common cause of peripheral neuropathy.  Nerve injury.  Pressure or stress on a nerve that lasts a long time.  Lack (deficiency) of B vitamins. This can result from alcoholism, poor diet, or a restricted diet.  Infections.  Autoimmune diseases, such as rheumatoid arthritis and systemic lupus erythematosus.  Nerve diseases that are passed from parent to child (inherited).  Some medicines, such as cancer medicines (chemotherapy).  Poisonous (toxic) substances, such as lead and mercury.  Too little blood flowing to the legs.  Kidney disease.  Thyroid disease. In some cases, the cause of this condition is not known. What are the signs or symptoms? Symptoms of this condition depend on which of your nerves is damaged. Common symptoms include:  Loss of feeling (numbness) in the feet, hands, or both.  Tingling in the feet, hands, or both.  Burning pain.  Very sensitive skin.  Weakness.  Not being able to move a part of the body (paralysis).  Muscle twitching.  Clumsiness or poor coordination.  Loss of balance.  Not being able to control your bladder.  Feeling dizzy.  Sexual problems. How is this diagnosed? Diagnosing and finding the cause of peripheral neuropathy can be difficult. Your health care provider will take your medical history and do a physical exam. A neurological exam will also be done. This involves checking things that are affected by your brain, spinal cord, and nerves (nervous system). For example, your health care provider will check your reflexes, how you move, and what you can feel. You may have other tests, such as:  Blood tests.  Electromyogram  (EMG) and nerve conduction tests. These tests check nerve function and how well the nerves are controlling the muscles.  Imaging tests, such as CT scans or MRI to rule out other causes of your symptoms.  Removing  a small piece of nerve to be examined in a lab (nerve biopsy). This is rare.  Removing and examining a small amount of the fluid that surrounds the brain and spinal cord (lumbar puncture). This is rare. How is this treated? Treatment for this condition may involve:  Treating the underlying cause of the neuropathy, such as diabetes, kidney disease, or vitamin deficiencies.  Stopping medicines that can cause neuropathy, such as chemotherapy.  Medicine to relieve pain. Medicines may include: ? Prescription or over-the-counter pain medicine. ? Antiseizure medicine. ? Antidepressants. ? Pain-relieving patches that are applied to painful areas of skin.  Surgery to relieve pressure on a nerve or to destroy a nerve that is causing pain.  Physical therapy to help improve movement and balance.  Devices to help you move around (assistive devices). Follow these instructions at home: Medicines  Take over-the-counter and prescription medicines only as told by your health care provider. Do not take any other medicines without first asking your health care provider.  Do not drive or use heavy machinery while taking prescription pain medicine. Lifestyle   Do not use any products that contain nicotine or tobacco, such as cigarettes and e-cigarettes. Smoking keeps blood from reaching damaged nerves. If you need help quitting, ask your health care provider.  Avoid or limit alcohol. Too much alcohol can cause a vitamin B deficiency, and vitamin B is needed for healthy nerves.  Eat a healthy diet. This includes: ? Eating foods that are high in fiber, such as fresh fruits and vegetables, whole grains, and beans. ? Limiting foods that are high in fat and processed sugars, such as fried or  sweet foods. General instructions   If you have diabetes, work closely with your health care provider to keep your blood sugar under control.  If you have numbness in your feet: ? Check every day for signs of injury or infection. Watch for redness, warmth, and swelling. ? Wear padded socks and comfortable shoes. These help protect your feet.  Develop a good support system. Living with peripheral neuropathy can be stressful. Consider talking with a mental health specialist or joining a support group.  Use assistive devices and attend physical therapy as told by your health care provider. This may include using a walker or a cane.  Keep all follow-up visits as told by your health care provider. This is important. Contact a health care provider if:  You have new signs or symptoms of peripheral neuropathy.  You are struggling emotionally from dealing with peripheral neuropathy.  Your pain is not well-controlled. Get help right away if:  You have an injury or infection that is not healing normally.  You develop new weakness in an arm or leg.  You fall frequently. Summary  Peripheral neuropathy is when the nerves in the arms, or legs are damaged, resulting in numbness, weakness, or pain.  There are many causes of peripheral neuropathy, including diabetes, pinched nerves, vitamin deficiencies, autoimmune disease, and hereditary conditions.  Diagnosing and finding the cause of peripheral neuropathy can be difficult. Your health care provider will take your medical history, do a physical exam, and do tests, including blood tests and nerve function tests.  Treatment involves treating the underlying cause of the neuropathy and taking medicines to help control pain. Physical therapy and assistive devices may also help. This information is not intended to replace advice given to you by your health care provider. Make sure you discuss any questions you have with your health care  provider.  Document Revised: 05/17/2017 Document Reviewed: 08/13/2016 Elsevier Patient Education  2020 [employer].

## 2019-12-31 NOTE — Chronic Care Management (AMB) (Signed)
Chronic Care Management Pharmacy  Name: Catherine Hinton  MRN: [id number] DOB: [birth year]   Chief Complaint/ HPI  [place],  74 y.o. , female presents for their Initial CCM visit with the clinical pharmacist via telephone due to COVID-19 Pandemic.  PCP : [name], NP Patient Care Team: [name], NP as PCP - General (Nurse Practitioner) Anell Barr, OD (Optometry) Vladimir Faster, [facility] (Pharmacist)  Their chronic conditions include: Hypertension, Hyperlipidemia, Diabetes and Vitamins D and B12 deficiencies, Morbid Obesity, gout  Office Visits: 10/02/19- Marnee Guarneri, NP- blood work-start Vit B12 1016mg/day, gabapentin 300 mg tid 09/18/19- Dr. JWynetta Emery abdominal pain x 1 month-bloodwork, abd CT-normal 06/24/19 - JMarnee Guarneri bloodwork  Consult Visit: 03/02/19 - Dr. WEllin Mayhew Optometry- not available   Allergies  Allergen Reactions  . Niacin Itching  . Sulfa Antibiotics Other (See Comments)    Fever    Medications: Outpatient Encounter Medications as of 12/31/2019  Medication Sig Note  . allopurinol (ZYLOPRIM) 300 MG tablet Take 1 tablet (300 mg total) by mouth daily.   .Marland Kitchenaspirin 81 MG EC tablet Take 81 mg by mouth daily.    . B Complex-C-Folic Acid (SUPER B COMPLEX/FA/VIT C PO) Take 1 tablet by mouth daily.   . Cholecalciferol (VITAMIN D-1000 MAX ST) 1000 UNITS tablet Take 1,000 Units by mouth daily. 12/01/2014: [place] by mouth.  . gabapentin (NEURONTIN) 300 MG capsule Take 1 capsule (300 mg total) by mouth 3 (three) times daily.   .Marland Kitchengemfibrozil (LOPID) 600 MG tablet TAKE 1 TABLET TWICE DAILY   . metFORMIN (GLUCOPHAGE) 500 MG tablet Take 2 tablets (1,000 mg total) by mouth 2 (two) times daily.   . metoprolol succinate (TOPROL-XL) 25 MG 24 hr tablet Take 1 tablet (25 mg total) by mouth daily.   . Multiple Vitamins-Minerals (EYE VITAMINS PO) Take 1 tablet by mouth daily.   .  valsartan-hydrochlorothiazide (DIOVAN-HCT) 160-12.5 MG tablet Take 1 tablet by mouth daily.   .Marland KitchenXIIDRA 5 % SOLN Place 1 drop into both eyes 2 (two) times daily.  04/25/2015: Received from: External Pharmacy  . DHA-EPA-Flaxseed Oil-Vitamin E (THERA TEARS NUTRITION PO) Take 1 mg by mouth 2 (two) times daily.     No facility-administered encounter medications on file as of 12/31/2019.     Current Diagnosis/Assessment:    Goals Addressed            This Visit's Progress   . Chronic Care Management -PharmD       CARE PLAN ENTRY (see longitudinal plan of care for additional care plan information)  Current Barriers:  . Chronic Disease Management support, education, and care coordination needs related to Hypertension, Hyperlipidemia, Diabetes, Gout, and Vitamin D and B12 Deficiency   Hypertension BP Readings from Last 3 Encounters:  10/02/19 127/74  09/18/19 115/75  06/24/19 105/73   . Pharmacist Clinical Goal(s): o Over the next 90 days, patient will work with PharmD and providers to maintain BP goal <130/80 . Current regimen:  .Alphonse Guild160-12.566mqd . Metoprolol succinate 25 mg qd . Patient self care activities - Over the next 90 days, patient will: o Check BP daily document, and provide at future appointments o Ensure daily salt intake < 2300 mg/day  Hyperlipidemia Lab Results  Component Value Date/Time   LDLCALC 64 10/02/2019 10:43 AM   . Pharmacist Clinical Goal(s): o Over the next 90 days, patient will work with PharmD and providers to maintain LDL goal <  70 . Current regimen:  . Aspirin 81 mg daily . Gemfibrozil 629m bid . DHA-EPA-Flaxseed oil- Vit E 1200 mg qd . Interventions: o Will discuss with [name] possibly discontinuing aspirin and changing gemfibrozil to another agent depending on labs. . Patient self care activities - Over the next 90 days, patient will: o Continue eating a healty diet, increased vegetables and limited trans and saturated fats and  sodium. o Increase exercise as tolerated  Diabetes Lab Results  Component Value Date/Time   HGBA1C 6.5 10/02/2019 10:41 AM   HGBA1C 8.0 (H) 07/10/2019 09:07 AM   HGBA1C 6.8 11/06/2017 12:00 AM   . Pharmacist Clinical Goal(s): o Over the next 90 days, patient will work with PharmD and providers to maintain A1c goal <7% . Current regimen:  . Metformin 1000 mg twice daily(takes 2 505m) . Gabapentin 300 mg three times daily o  . Interventions:  Will discuss with [name] increasing gabapentin dose for greater relief of foot tingling. . Patient self care activities - Over the next 90 days, patient will: o Check blood sugar in the morning before eating or drinking, document, and provide at future appointments o Contact provider with any episodes of hypoglycemia  . Medication management . Pharmacist Clinical Goal(s): o Over the next 90 days, patient will work with PharmD and providers to maintain optimal medication adherence . [facility] . Interventions o Comprehensive medication review performed. o Continue current medication management strategy . Patient self care activities - Over the next 90 days, patient will: o Focus on medication adherence by continued use of pill box. o Take medications as prescribed o Report any questions or concerns to PharmD and/or provider(s)  Initial goal documentation        Diabetes   A1c goal <7%  Recent Relevant Labs: Lab Results  Component Value Date/Time   HGBA1C 6.5 10/02/2019 10:41 AM   HGBA1C 8.0 (H) 07/10/2019 09:07 AM   HGBA1C 6.8 11/06/2017 12:00 AM   MICROALBUR 10 07/10/2019 09:07 AM   MICROALBUR 80 (H) 12/09/2018 08:20 AM    Last diabetic Eye exam:  Lab Results  Component Value Date/Time   HMDIABEYEEXA No Retinopathy 08/25/2019 12:00 AM    Last diabetic Foot exam:  Lab Results  Component Value Date/Time   HMDIABFOOTEX PP 11/02/2014 12:00 AM     Checking BG: Daily  Recent FBG Readings: 130    Patient has failed these meds in past: metformin 50078matient is currently controlled on the following medications: . MMarland Kitchentformin 1000 mg bid (takes 2 500m57m. Gabapentin 300 mg tid  We discussed: diet and exercise extensively. Patient is afraid to walk for exercise because of the "tingling and numbness" in her feet. She reports it has improved slightly with gabapentin and she is tolerating without adverse effects.   Plan  Recommend increasing gabapentin as tolerated.  Hypertension   BP goal is:  <130/80  Office blood pressures are  BP Readings from Last 3 Encounters:  10/02/19 127/74  09/18/19 115/75  06/24/19 105/73   CMP Latest Ref Rng & Units 10/02/2019 09/18/2019 09/18/2019  Glucose 65 - 99 mg/dL 118(H) - 128(H)  BUN 8 - 27 mg/dL 11 - 10  Creatinine 0.57 - 1.00 mg/dL 0.62 0.50 0.62  Sodium 134 - 144 mmol/L 141 - 141  Potassium 3.5 - 5.2 mmol/L 4.4 - 3.8  Chloride 96 - 106 mmol/L 100 - 99  CO2 20 - 29 mmol/L 24 - 23  Calcium 8.7 - 10.3 mg/dL  9.6 - 10.2  Total Protein 6.0 - 8.5 g/dL 7.1 - 7.1  Total Bilirubin 0.0 - 1.2 mg/dL 0.4 - 0.4  Alkaline Phos 39 - 117 IU/L 46 - 48  AST 0 - 40 IU/L 16 - 19  ALT 0 - 32 IU/L 20 - 17    Patient checks BP at home 3-5x per week Patient home BP readings are ranging: 116/80 116/72 HR runs in 80's  Patient has failed these meds in the past: Remote past Patient is currently controlled on the following medications:  Alphonse Guild 160-12.21m qd . Metoprolol succinate 25 mg qd  We discussed Benefits of taking one of her BP meds at night to cover nocturnal hypertension. We discussed the DASH diet and limiting salt intake. She is doing a great job of home monitoring.  Plan  Continue current medications. Recommend changing timing of valsartan-hctz to bedtime.     Hyperlipidemia   LDL goal < 70  Lipid Panel     Component Value Date/Time   CHOL 130 10/02/2019 1043   CHOL 225 (H) 12/09/2018 0820   TRIG 186 (H) 10/02/2019 1043    TRIG 115 12/09/2018 0820   HDL 35 (L) 10/02/2019 1043   LDLCALC 64 10/02/2019 1043    Hepatic Function Latest Ref Rng & Units 10/02/2019 09/18/2019 03/24/2019  Total Protein 6.0 - 8.5 g/dL 7.1 7.1 6.7  Albumin 3.7 - 4.7 g/dL 4.7 4.8(H) 4.5  AST 0 - 40 IU/L _0 ALT 0 - 32 IU/L _1 Alk Phosphatase 39 - 117 IU/L 46 48 49  Total Bilirubin 0.0 - 1.2 mg/dL 0.4 0.4 0.5     The ASCVD Risk score ([place], et al., 2013) failed to calculate for the following reasons:   The systolic blood pressure is missing   Patient has failed these meds in past: None Patient is currently controlled on the following medications:  . Aspirin 81 mg daily . Gemfibrozil 6060mbid . DHA-EPA-Flaxseed oil- Vit E 1200 mg qd  We discussed:  diet and exercise extensively. Patient reports eating increased fresh veggies from her garden. Patient reports she is hesitant to walk for exercise because of the neuropathy in her feet.   Plan  Recommend discontinuing aspirin for primary prevention given patient age >7>74 Consider changing gemfibrozil to Rosuvastatin or simvastatin if HDL remain low and TG's continue rising. . Vitamins D/B12 Deficiencies   Vit D, 25-Hydroxy  Date Value Ref Range Status  10/02/2019 43.6 30.0 - 100.0 ng/mL Final    Comment:    Vitamin D deficiency has been defined by the [place] guideline as a level of serum 25-OH vitamin D less than 20 ng/mL (1,2). The Endocrine Society went on to further define vitamin D insufficiency as a level between 21 and 29 ng/mL (2). 1. IOM (Institute of Medicine). 2010. Dietary reference    intakes for calcium and D. [place]    [employer]2. Holick MF, Binkley [place], Bischoff-Ferrari HA, et al.    Evaluation, treatment, and prevention of vitamin D    deficiency: an Endocrine Society clinical practice    guideline. JCEM. 2011 Jul; 96(7):1911-30.    10/02/19 Vitamin B-12 232 -  1,245 pg/mL 222Low      Cholecalciferol 1000 u qd Mulitvitamin -minerals qd B complex with C qd   Plan  Continue current medications   Gout   Uric Acid 4.1 07/10/19  Patient has  failed these meds in past: N/A Patient is currently controlled on the following medications:  . Allopurinol 347m daily  We discussed:  Patient states she hasn't had a gout attack in 6-7 years Plan  Continue current medications   Misc/Other   Xiidra 5% eyedrops 1 drop both eyes bid for dry eyes (managed by Optometry) Preservision Multivitamin qd  Plan Continue current therapy  Vaccines   Reviewed and discussed patient's vaccination history.    Immunization History  Administered Date(s) Administered  . Fluad Quad(high Dose 65+) 03/24/2019  . Influenza, High Dose Seasonal PF 04/25/2016, 04/04/2017, 02/18/2018  . Influenza,inj,Quad PF,6+ Mos 04/25/2015  . Influenza-Unspecified 04/18/2012  . PFIZER SARS-COV-2 Vaccination 07/29/2019, 08/19/2019  . Pneumococcal Conjugate-13 04/20/2014  . Pneumococcal Polysaccharide-23 10/26/2015  . Pneumococcal-Unspecified 06/18/2006  . Tdap 12/12/2010  . Zoster 09/21/2013    Plan  Recommended patient receive Shingrix vaccine    Medication Management   Pt uses [facility] mail order pharmacy for all medications Uses pill box? Yes Pt endorses 100% compliance  We discussed: Patient is very compliant with her medications and self monitoring of BP and BG.  Plan  Continue current medication management strategy  Follow up: PharmD will follow up in 3 months     JJunita Push HKenton KingfisherPharmD, [facility] [phone]

## 2020-01-01 ENCOUNTER — Other Ambulatory Visit: Payer: Self-pay

## 2020-01-01 ENCOUNTER — Encounter: Payer: Self-pay | Admitting: Nurse Practitioner

## 2020-01-01 ENCOUNTER — Ambulatory Visit (INDEPENDENT_AMBULATORY_CARE_PROVIDER_SITE_OTHER): Payer: Medicare Other | Admitting: Nurse Practitioner

## 2020-01-01 VITALS — BP 125/74 | HR 96 | Temp 98.6°F | Wt 220.0 lb

## 2020-01-01 DIAGNOSIS — Z780 Asymptomatic menopausal state: Secondary | ICD-10-CM

## 2020-01-01 DIAGNOSIS — E1140 Type 2 diabetes mellitus with diabetic neuropathy, unspecified: Secondary | ICD-10-CM

## 2020-01-01 DIAGNOSIS — E538 Deficiency of other specified B group vitamins: Secondary | ICD-10-CM | POA: Diagnosis not present

## 2020-01-01 DIAGNOSIS — I10 Essential (primary) hypertension: Secondary | ICD-10-CM

## 2020-01-01 DIAGNOSIS — Z1211 Encounter for screening for malignant neoplasm of colon: Secondary | ICD-10-CM

## 2020-01-01 DIAGNOSIS — E1169 Type 2 diabetes mellitus with other specified complication: Secondary | ICD-10-CM

## 2020-01-01 DIAGNOSIS — Z1231 Encounter for screening mammogram for malignant neoplasm of breast: Secondary | ICD-10-CM | POA: Diagnosis not present

## 2020-01-01 DIAGNOSIS — G4733 Obstructive sleep apnea (adult) (pediatric): Secondary | ICD-10-CM

## 2020-01-01 DIAGNOSIS — E1159 Type 2 diabetes mellitus with other circulatory complications: Secondary | ICD-10-CM

## 2020-01-01 DIAGNOSIS — Z6836 Body mass index (BMI) 36.0-36.9, adult: Secondary | ICD-10-CM | POA: Diagnosis not present

## 2020-01-01 DIAGNOSIS — E785 Hyperlipidemia, unspecified: Secondary | ICD-10-CM | POA: Diagnosis not present

## 2020-01-01 LAB — BAYER DCA HB A1C WAIVED: HB A1C (BAYER DCA - WAIVED): 6.5 % (ref <7.0)

## 2020-01-01 NOTE — Assessment & Plan Note (Signed)
BMI 38.97.  Recommended eating smaller high protein, low fat meals more frequently and exercising 30 mins a day 5 times a week with a goal of 10-15lb weight loss in the next 3 months. Patient voiced their understanding and motivation to adhere to these recommendations.

## 2020-01-01 NOTE — Assessment & Plan Note (Signed)
With T2DM.  Recommend heavy focus on healthy diet and regular exercise regimen. 

## 2020-01-01 NOTE — Progress Notes (Addendum)
BP 125/74   Pulse 96   Temp 98.6 F (37 C) (Oral)   Wt 220 lb (99.8 kg)   LMP  (LMP Unknown)   SpO2 97%   BMI 38.97 kg/m    Subjective:    [name], female    DOB: 1945-08-06, 74 y.o.   MRN: [id number]  HPI: Catherine Hinton is a 74 y.o. female  Chief Complaint  Patient presents with  . Diabetes  . Hypertension  . Hyperlipidemia   DIABETES Last A1C was 6.5% in April.  Continues on Metformin 500 MG twice daily. Takes Gabapentin 300 MG TID for neuropathy.  Last visit B12 level was 222 -- started taking B12 daily.  Hypoglycemic episodes:no Polydipsia/polyuria: no Visual disturbance: no Chest pain: no Paresthesias: no Glucose Monitoring: yes  Accucheck frequency: Daily  Fasting glucose: 130 in the morning  Post prandial:  Evening:  Before meals: Taking Insulin?: no  Long acting insulin:  Short acting insulin: Blood Pressure Monitoring: daily Retinal Examination: Up to Date Foot Exam: Up to Date Pneumovax: Up to Date Influenza: Up to Date Aspirin: yes   HYPERTENSION / HYPERLIPIDEMIA Continues on Metoprolol and Valsartan-HCTZ, ASA and Lopid.  Has OSA and uses CPAP 100%. Satisfied with current treatment? yes Duration of hypertension: chronic BP monitoring frequency: daily BP range: 110-120/70 at home BP medication side effects: no Duration of hyperlipidemia: chronic Cholesterol medication side effects: no Cholesterol supplements: none Medication compliance: good compliance Aspirin: yes Recent stressors: no Recurrent headaches: no Visual changes: no Palpitations: no Dyspnea: no Chest pain: no Lower extremity edema: baseline, no worse Dizzy/lightheaded: no   Relevant past medical, surgical, family and social history reviewed and updated as indicated. Interim medical history since our last visit reviewed. Allergies and medications reviewed and updated.  Review of Systems  Constitutional: Negative for activity change, appetite change,  diaphoresis, fatigue and fever.  Respiratory: Negative for cough, chest tightness, shortness of breath and wheezing.   Cardiovascular: Negative for chest pain, palpitations and leg swelling.  Gastrointestinal: Negative.   Endocrine: Negative for cold intolerance, heat intolerance, polydipsia, polyphagia and polyuria.  Genitourinary: Negative.   Neurological: Negative.   Psychiatric/Behavioral: Negative.     Per HPI unless specifically indicated above     Objective:    BP 125/74   Pulse 96   Temp 98.6 F (37 C) (Oral)   Wt 220 lb (99.8 kg)   LMP  (LMP Unknown)   SpO2 97%   BMI 38.97 kg/m   Wt Readings from Last 3 Encounters:  01/01/20 220 lb (99.8 kg)  12/25/19 220 lb (99.8 kg)  10/02/19 219 lb (99.3 kg)    Physical Exam Vitals and nursing note reviewed.  Constitutional:      General: She is awake. She is not in acute distress.    Appearance: She is well-developed. She is obese. She is not ill-appearing.  HENT:     Head: Normocephalic.     Right Ear: Hearing, tympanic membrane and ear canal normal. No drainage.     Left Ear: Hearing, tympanic membrane, ear canal and external ear normal. No drainage.     Nose: Nose normal.     Mouth/Throat:     Mouth: Mucous membranes are moist.     Pharynx: Oropharynx is clear.  Eyes:     General: Lids are normal.        Right eye: No discharge.        Left eye: No discharge.     Conjunctiva/sclera: Conjunctivae  normal.     Pupils: Pupils are equal, round, and reactive to light.  Neck:     Vascular: No carotid bruit.  Cardiovascular:     Rate and Rhythm: Normal rate and regular rhythm.     Heart sounds: Normal heart sounds. No murmur heard.  No gallop.   Pulmonary:     Effort: Pulmonary effort is normal. No accessory muscle usage or respiratory distress.     Breath sounds: Normal breath sounds.  Abdominal:     General: Bowel sounds are normal.     Palpations: Abdomen is soft. There is no hepatomegaly or splenomegaly.      Tenderness: There is no abdominal tenderness. There is no right CVA tenderness or left CVA tenderness.  Musculoskeletal:     Cervical back: Normal range of motion and neck supple.     Right lower leg: No edema.     Left lower leg: No edema.  Lymphadenopathy:     Cervical: No cervical adenopathy.  Skin:    General: Skin is warm and dry.  Neurological:     Mental Status: She is alert and oriented to person, place, and time.  Psychiatric:        Attention and Perception: Attention normal.        Mood and Affect: Mood normal.        Behavior: Behavior normal. Behavior is cooperative.        Thought Content: Thought content normal.        Judgment: Judgment normal.     Results for orders placed or performed in visit on 12/17/19  TSH  Result Value Ref Range   TSH 0.530 0.450 - 4.500 uIU/mL      Assessment & Plan:   Problem List Items Addressed This Visit      Cardiovascular and Mediastinum   Hypertension associated with diabetes ([place])    Chronic, stable with BP below goal on home readings and in office. Continue current medication regimen and adjust as needed based on BP readings + focus on DASH diet.  BMP today. Could consider discontinuation of HCTZ at upcoming visit, as BP well controlled and history of gout.  Return in 3 months.      Relevant Orders   Bayer DCA Hb A1c Waived   Basic metabolic panel     Respiratory   Obstructive sleep apnea    Continue nightly use, praised for this.        Endocrine   Hyperlipidemia associated with type 2 diabetes mellitus (HCC)    Chronic, stable.  Continue current medication regimen and adjust as needed.  Lipid panel next visit.  April LDL <70.      Type 2 diabetes mellitus with diabetic neuropathy, unspecified (HCC) - Primary    Chronic, ongoing.  A1C 6.5% today, staying stable, praised for this.  Continue Metformin 1000 MG BID and adjust regimen as needed.  Focus on diabetic diet at home and regular exercise.  Continue Gabapentin  daily for neuropathy and B12.  Will recheck B12 level today.  Return in 3 months.      Relevant Orders   Bayer DCA Hb A1c Waived     Other   Morbid obesity ([place])    BMI 38.97.  Recommended eating smaller high protein, low fat meals more frequently and exercising 30 mins a day 5 times a week with a goal of 10-15lb weight loss in the next 3 months. Patient voiced their understanding and motivation to adhere to these recommendations.  BMI 36.0-36.9,adult    With T2DM.  Recommend heavy focus on healthy diet and regular exercise regimen.      B12 deficiency    New diagnosis, noted on recent labs, level 222.  Continue daily supplement and recheck level today.  She is on chronic Metformin, which can cause lower B12 levels.  Educated her on this.      Relevant Orders   Vitamin B12    Other Visit Diagnoses    Encounter for screening mammogram for malignant neoplasm of breast       Mammogram ordered   Relevant Orders   MM DIGITAL SCREENING BILATERAL   Colon cancer screening       GI referral placed.   Relevant Orders   Ambulatory referral to Gastroenterology   Postmenopausal estrogen deficiency       DEXA scan ordered   Relevant Orders   DG Bone Density       Follow up plan: Return in about 3 months (around 04/02/2020) for T2DM, HTN/HLD.

## 2020-01-01 NOTE — Assessment & Plan Note (Signed)
Continue nightly use, praised for this.

## 2020-01-01 NOTE — Assessment & Plan Note (Signed)
New diagnosis, noted on recent labs, level 222.  Continue daily supplement and recheck level today.  She is on chronic Metformin, which can cause lower B12 levels.  Educated her on this.

## 2020-01-01 NOTE — Assessment & Plan Note (Signed)
Chronic, ongoing.  A1C 6.5% today, staying stable, praised for this.  Continue Metformin 1000 MG BID and adjust regimen as needed.  Focus on diabetic diet at home and regular exercise.  Continue Gabapentin daily for neuropathy and B12.  Will recheck B12 level today.  Return in 3 months.

## 2020-01-01 NOTE — Assessment & Plan Note (Signed)
Chronic, stable with BP below goal on home readings and in office. Continue current medication regimen and adjust as needed based on BP readings + focus on DASH diet.  BMP today. Could consider discontinuation of HCTZ at upcoming visit, as BP well controlled and history of gout.  Return in 3 months.

## 2020-01-01 NOTE — Assessment & Plan Note (Signed)
Chronic, stable.  Continue current medication regimen and adjust as needed.  Lipid panel next visit.  April LDL <70.

## 2020-01-01 NOTE — Patient Instructions (Addendum)
Please call at this number ([facility]) to schedule your mammogram. [phone]  Diabetes Mellitus and Nutrition, Adult When you have diabetes (diabetes mellitus), it is very important to have healthy eating habits because your blood sugar (glucose) levels are greatly affected by what you eat and drink. Eating healthy foods in the appropriate amounts, at about the same times every day, can help you:  Control your blood glucose.  Lower your risk of heart disease.  Improve your blood pressure.  Reach or maintain a healthy weight. Every person with diabetes is different, and each person has different needs for a meal plan. Your health care provider may recommend that you work with a diet and nutrition specialist (dietitian) to make a meal plan that is best for you. Your meal plan may vary depending on factors such as:  The calories you need.  The medicines you take.  Your weight.  Your blood glucose, blood pressure, and cholesterol levels.  Your activity level.  Other health conditions you have, such as heart or kidney disease. How do carbohydrates affect me? Carbohydrates, also called carbs, affect your blood glucose level more than any other type of food. Eating carbs naturally raises the amount of glucose in your blood. Carb counting is a method for keeping track of how many carbs you eat. Counting carbs is important to keep your blood glucose at a healthy level, especially if you use insulin or take certain oral diabetes medicines. It is important to know how many carbs you can safely have in each meal. This is different for every person. Your dietitian can help you calculate how many carbs you should have at each meal and for each snack. Foods that contain carbs include:  Bread, cereal, rice, pasta, and crackers.  Potatoes and corn.  Peas, beans, and lentils.  Milk and yogurt.  Fruit and juice.  Desserts, such as cakes, cookies, ice cream, and candy. How  does alcohol affect me? Alcohol can cause a sudden decrease in blood glucose (hypoglycemia), especially if you use insulin or take certain oral diabetes medicines. Hypoglycemia can be a life-threatening condition. Symptoms of hypoglycemia (sleepiness, dizziness, and confusion) are similar to symptoms of having too much alcohol. If your health care provider says that alcohol is safe for you, follow these guidelines:  Limit alcohol intake to no more than 1 drink per day for nonpregnant women and 2 drinks per day for men. One drink equals 12 oz of beer, 5 oz of wine, or 1 oz of hard liquor.  Do not drink on an empty stomach.  Keep yourself hydrated with water, diet soda, or unsweetened iced tea.  Keep in mind that regular soda, juice, and other mixers may contain a lot of sugar and must be counted as carbs. What are tips for following this plan?  Reading food labels  Start by checking the serving size on the "Nutrition Facts" label of packaged foods and drinks. The amount of calories, carbs, fats, and other nutrients listed on the label is based on one serving of the item. Many items contain more than one serving per package.  Check the total grams (g) of carbs in one serving. You can calculate the number of servings of carbs in one serving by dividing the total carbs by 15. For example, if a food has 30 g of total carbs, it would be equal to 2 servings of carbs.  Check the number of grams (g) of saturated and trans fats in one serving. Choose   foods that have low or no amount of these fats.  Check the number of milligrams (mg) of salt (sodium) in one serving. Most people should limit total sodium intake to less than 2,300 mg per day.  Always check the nutrition information of foods labeled as "low-fat" or "nonfat". These foods may be higher in added sugar or refined carbs and should be avoided.  Talk to your dietitian to identify your daily goals for nutrients listed on the  label. Shopping  Avoid buying canned, premade, or processed foods. These foods tend to be high in fat, sodium, and added sugar.  Shop around the outside edge of the grocery store. This includes fresh fruits and vegetables, bulk grains, fresh meats, and fresh dairy. Cooking  Use low-heat cooking methods, such as baking, instead of high-heat cooking methods like deep frying.  Cook using healthy oils, such as olive, canola, or sunflower oil.  Avoid cooking with butter, cream, or high-fat meats. Meal planning  Eat meals and snacks regularly, preferably at the same times every day. Avoid going long periods of time without eating.  Eat foods high in fiber, such as fresh fruits, vegetables, beans, and whole grains. Talk to your dietitian about how many servings of carbs you can eat at each meal.  Eat 4-6 ounces (oz) of lean protein each day, such as lean meat, chicken, fish, eggs, or tofu. One oz of lean protein is equal to: ? 1 oz of meat, chicken, or fish. ? 1 egg. ?  cup of tofu.  Eat some foods each day that contain healthy fats, such as avocado, nuts, seeds, and fish. Lifestyle  Check your blood glucose regularly.  Exercise regularly as told by your health care provider. This may include: ? 150 minutes of moderate-intensity or vigorous-intensity exercise each week. This could be brisk walking, biking, or water aerobics. ? Stretching and doing strength exercises, such as yoga or weightlifting, at least 2 times a week.  Take medicines as told by your health care provider.  Do not use any products that contain nicotine or tobacco, such as cigarettes and e-cigarettes. If you need help quitting, ask your health care provider.  Work with a counselor or diabetes educator to identify strategies to manage stress and any emotional and social challenges. Questions to ask a health care provider  Do I need to meet with a diabetes educator?  Do I need to meet with a dietitian?  What  number can I call if I have questions?  When are the best times to check my blood glucose? Where to find more information:  American Diabetes Association: diabetes.org  Academy of Nutrition and Dietetics: www.eatright.org  National Institute of Diabetes and Digestive and Kidney Diseases (NIH): www.niddk.nih.gov Summary  A healthy meal plan will help you control your blood glucose and maintain a healthy lifestyle.  Working with a diet and nutrition specialist (dietitian) can help you make a meal plan that is best for you.  Keep in mind that carbohydrates (carbs) and alcohol have immediate effects on your blood glucose levels. It is important to count carbs and to use alcohol carefully. This information is not intended to replace advice given to you by your health care provider. Make sure you discuss any questions you have with your health care provider. Document Revised: 05/17/2017 Document Reviewed: 07/09/2016 Elsevier Patient Education  2020 Elsevier Inc.  

## 2020-01-02 LAB — BASIC METABOLIC PANEL
BUN/Creatinine Ratio: 17 (ref 12–28)
BUN: 10 mg/dL (ref 8–27)
CO2: 24 mmol/L (ref 20–29)
Calcium: 9.6 mg/dL (ref 8.7–10.3)
Chloride: 99 mmol/L (ref 96–106)
Creatinine, Ser: 0.59 mg/dL (ref 0.57–1.00)
GFR calc Af Amer: 107 mL/min/{1.73_m2} (ref >59)
GFR calc non Af Amer: 93 mL/min/{1.73_m2} (ref >59)
Glucose: 152 mg/dL — ABNORMAL HIGH (ref 65–99)
Potassium: 4.2 mmol/L (ref 3.5–5.2)
Sodium: 139 mmol/L (ref 134–144)

## 2020-01-02 LAB — VITAMIN B12: Vitamin B-12: 290 pg/mL (ref 232–1245)

## 2020-01-03 NOTE — Progress Notes (Signed)
Please let [place] know her labs have returned: - Kidney function still looks great - Vitamin B12 level is still on lower side, but improving.  Please make sure to take your Vitamin B12 supplement 1000 MCG daily and we will recheck again next visit.  If any questions let me know.  Have a great day!!

## 2020-01-04 ENCOUNTER — Telehealth: Payer: Self-pay

## 2020-01-04 ENCOUNTER — Other Ambulatory Visit: Payer: Self-pay | Admitting: Nurse Practitioner

## 2020-01-04 DIAGNOSIS — E1140 Type 2 diabetes mellitus with diabetic neuropathy, unspecified: Secondary | ICD-10-CM

## 2020-01-04 NOTE — Telephone Encounter (Signed)
-----   Message from Vladimir Faster, [facility] sent at 12/31/2019  1:24 PM EDT ----- Regarding: Referral to CCM needed  Hello,  Could you please help me get a referral for CCM services for Catherine Hinton? I had an appointment with her this morning.  Thanks, Almyra Free

## 2020-01-04 NOTE — Telephone Encounter (Signed)
Referral in

## 2020-01-04 NOTE — Telephone Encounter (Signed)
Routing to provider  

## 2020-01-04 NOTE — Progress Notes (Signed)
Ref ccm 

## 2020-01-06 ENCOUNTER — Telehealth (INDEPENDENT_AMBULATORY_CARE_PROVIDER_SITE_OTHER): Payer: Self-pay | Admitting: Gastroenterology

## 2020-01-06 ENCOUNTER — Other Ambulatory Visit: Payer: Self-pay

## 2020-01-06 ENCOUNTER — Other Ambulatory Visit: Payer: Self-pay | Admitting: Nurse Practitioner

## 2020-01-06 DIAGNOSIS — E1140 Type 2 diabetes mellitus with diabetic neuropathy, unspecified: Secondary | ICD-10-CM

## 2020-01-06 DIAGNOSIS — E1169 Type 2 diabetes mellitus with other specified complication: Secondary | ICD-10-CM

## 2020-01-06 DIAGNOSIS — Z1211 Encounter for screening for malignant neoplasm of colon: Secondary | ICD-10-CM

## 2020-01-06 NOTE — Progress Notes (Signed)
Gastroenterology Pre-Procedure Review  Request Date: Monday 02/01/20 Requesting Physician: [name]  PATIENT REVIEW QUESTIONS: The patient responded to the following health history questions as indicated:    1. Are you having any GI issues? no 2. Do you have a personal history of Polyps? yes (pt states a tiny polyps 10 years ago) 3. Do you have a family history of Colon Cancer or Polyps? yes (grandmother colon cancer) 4. Diabetes Mellitus? yes (type 2 takes oral meds) 5. Joint replacements in the past 12 months?no 6. Major health problems in the past 3 months?no 7. Any artificial heart valves, MVP, or defibrillator?no    MEDICATIONS & ALLERGIES:    Patient reports the following regarding taking any anticoagulation/antiplatelet therapy:   Plavix, Coumadin, Eliquis, Xarelto, Lovenox, Pradaxa, Brilinta, or Effient? no Aspirin? 81 mg daily  Patient confirms/reports the following medications:  Current Outpatient Medications  Medication Sig Dispense Refill   allopurinol (ZYLOPRIM) 300 MG tablet Take 1 tablet (300 mg total) by mouth daily. 90 tablet 4   aspirin 81 MG EC tablet Take 81 mg by mouth daily.      B Complex-C-Folic Acid (SUPER B COMPLEX/FA/VIT C PO) Take 1 tablet by mouth daily.     Cholecalciferol (VITAMIN D-1000 MAX ST) 1000 UNITS tablet Take 1,000 Units by mouth daily.     DHA-EPA-Flaxseed Oil-Vitamin E (THERA TEARS NUTRITION PO) Take 1 mg by mouth 2 (two) times daily.      gabapentin (NEURONTIN) 300 MG capsule Take 1 capsule (300 mg total) by mouth 3 (three) times daily. 90 capsule 3   gemfibrozil (LOPID) 600 MG tablet TAKE 1 TABLET TWICE DAILY 180 tablet 3   metFORMIN (GLUCOPHAGE) 500 MG tablet Take 2 tablets (1,000 mg total) by mouth 2 (two) times daily. 360 tablet 2   metoprolol succinate (TOPROL-XL) 25 MG 24 hr tablet Take 1 tablet (25 mg total) by mouth daily. 90 tablet 4   Multiple Vitamins-Minerals (EYE VITAMINS PO) Take 1 tablet by mouth daily.      valsartan-hydrochlorothiazide (DIOVAN-HCT) 160-12.5 MG tablet Take 1 tablet by mouth daily. 90 tablet 4   XIIDRA 5 % SOLN Place 1 drop into both eyes 2 (two) times daily.      No current facility-administered medications for this visit.    Patient confirms/reports the following allergies:  Allergies  Allergen Reactions   Niacin Itching   Sulfa Antibiotics Other (See Comments)    Fever    No orders of the defined types were placed in this encounter.   AUTHORIZATION INFORMATION Primary Insurance: 1D#: Group #:  Secondary Insurance: 1D#: Group #:  SCHEDULE INFORMATION: Date: 02/01/20 Time: Location:[facility]

## 2020-01-26 ENCOUNTER — Encounter: Payer: Self-pay | Admitting: Gastroenterology

## 2020-01-26 ENCOUNTER — Other Ambulatory Visit: Payer: Self-pay

## 2020-01-27 ENCOUNTER — Other Ambulatory Visit: Payer: Self-pay | Admitting: Nurse Practitioner

## 2020-01-28 ENCOUNTER — Other Ambulatory Visit: Payer: Self-pay

## 2020-01-28 ENCOUNTER — Other Ambulatory Visit
Admission: RE | Admit: 2020-01-28 | Discharge: 2020-01-28 | Disposition: A | Discharge status: Home / Self Care | Payer: Medicare Other | Source: Ambulatory Visit | Attending: Gastroenterology | Admitting: Gastroenterology

## 2020-01-28 DIAGNOSIS — Z01812 Encounter for preprocedural laboratory examination: Secondary | ICD-10-CM | POA: Diagnosis not present

## 2020-01-28 DIAGNOSIS — Z20822 Contact with and (suspected) exposure to covid-19: Secondary | ICD-10-CM | POA: Insufficient documentation

## 2020-01-29 LAB — SARS CORONAVIRUS 2 (TAT 6-24 HRS): SARS Coronavirus 2: NEGATIVE

## 2020-01-29 NOTE — Discharge Instructions (Signed)
General Anesthesia, Adult, Care After This sheet gives you information about how to care for yourself after your procedure. Your health care provider may also give you more specific instructions. If you have problems or questions, contact your health care provider. What can I expect after the procedure? After the procedure, the following side effects are common:  Pain or discomfort at the IV site.  Nausea.  Vomiting.  Sore throat.  Trouble concentrating.  Feeling cold or chills.  Weak or tired.  Sleepiness and fatigue.  Soreness and body aches. These side effects can affect parts of the body that were not involved in surgery. Follow these instructions at home:  For at least 24 hours after the procedure:  Have a responsible adult stay with you. It is important to have someone help care for you until you are awake and alert.  Rest as needed.  Do not: ? Participate in activities in which you could fall or become injured. ? Drive. ? Use heavy machinery. ? Drink alcohol. ? Take sleeping pills or medicines that cause drowsiness. ? Make important decisions or sign legal documents. ? Take care of children on your own. Eating and drinking  Follow any instructions from your health care provider about eating or drinking restrictions.  When you feel hungry, start by eating small amounts of foods that are soft and easy to digest (bland), such as toast. Gradually return to your regular diet.  Drink enough fluid to keep your urine pale yellow.  If you vomit, rehydrate by drinking water, juice, or clear broth. General instructions  If you have sleep apnea, surgery and certain medicines can increase your risk for breathing problems. Follow instructions from your health care provider about wearing your sleep device: ? Anytime you are sleeping, including during daytime naps. ? While taking prescription pain medicines, sleeping medicines, or medicines that make you drowsy.  Return to  your normal activities as told by your health care provider. Ask your health care provider what activities are safe for you.  Take over-the-counter and prescription medicines only as told by your health care provider.  If you smoke, do not smoke without supervision.  Keep all follow-up visits as told by your health care provider. This is important. Contact a health care provider if:  You have nausea or vomiting that does not get better with medicine.  You cannot eat or drink without vomiting.  You have pain that does not get better with medicine.  You are unable to pass urine.  You develop a skin rash.  You have a fever.  You have redness around your IV site that gets worse. Get help right away if:  You have difficulty breathing.  You have chest pain.  You have blood in your urine or stool, or you vomit blood. Summary  After the procedure, it is common to have a sore throat or nausea. It is also common to feel tired.  Have a responsible adult stay with you for the first 24 hours after general anesthesia. It is important to have someone help care for you until you are awake and alert.  When you feel hungry, start by eating small amounts of foods that are soft and easy to digest (bland), such as toast. Gradually return to your regular diet.  Drink enough fluid to keep your urine pale yellow.  Return to your normal activities as told by your health care provider. Ask your health care provider what activities are safe for you. This information is not   intended to replace advice given to you by your health care provider. Make sure you discuss any questions you have with your health care provider. Document Revised: 06/07/2017 Document Reviewed: 01/18/2017 Elsevier Patient Education  2020 Elsevier Inc.  

## 2020-01-29 NOTE — Anesthesia Preprocedure Evaluation (Addendum)
Anesthesia Evaluation  Patient identified by MRN, date of birth, ID band Patient awake    Reviewed: Allergy & Precautions, NPO status , Patient's Chart, lab work & pertinent test results, reviewed documented beta blocker date and time   History of Anesthesia Complications Negative for: history of anesthetic complications  Airway Mallampati: III  TM Distance: <3 FB Neck ROM: Full    Dental   Pulmonary sleep apnea ,    breath sounds clear to auscultation       Cardiovascular hypertension, (-) angina(-) DOE  Rhythm:Regular Rate:Normal   HLD   Neuro/Psych  Neuromuscular disease (Peripheral neuropathy)    GI/Hepatic neg GERD  ,  Endo/Other  diabetes  Renal/GU CRFRenal disease     Musculoskeletal   Abdominal (+) + obese (BMI 39),   Peds  Hematology   Anesthesia Other Findings Gout  Reproductive/Obstetrics                            Anesthesia Physical Anesthesia Plan  ASA: III  Anesthesia Plan: General   Post-op Pain Management:    Induction: Intravenous  PONV Risk Score and Plan: 3 and Propofol infusion, TIVA and Treatment may vary due to age or medical condition  Airway Management Planned: Natural Airway and Nasal Cannula  Additional Equipment:   Intra-op Plan:   Post-operative Plan:   Informed Consent: I have reviewed the patients History and Physical, chart, labs and discussed the procedure including the risks, benefits and alternatives for the proposed anesthesia with the patient or authorized representative who has indicated his/her understanding and acceptance.       Plan Discussed with: CRNA and Anesthesiologist  Anesthesia Plan Comments:         Anesthesia Quick Evaluation

## 2020-02-01 ENCOUNTER — Other Ambulatory Visit: Payer: Self-pay

## 2020-02-01 ENCOUNTER — Encounter
Admission: RE | Disposition: A | Discharge status: Home / Self Care | Payer: Self-pay | Source: Home / Self Care | Attending: Gastroenterology

## 2020-02-01 ENCOUNTER — Ambulatory Visit
Admission: RE | Admit: 2020-02-01 | Discharge: 2020-02-01 | Disposition: A | Discharge status: Home / Self Care | Payer: Medicare Other | Attending: Gastroenterology | Admitting: Gastroenterology

## 2020-02-01 ENCOUNTER — Ambulatory Visit: Payer: Medicare Other | Admitting: Anesthesiology

## 2020-02-01 ENCOUNTER — Encounter: Payer: Self-pay | Admitting: Gastroenterology

## 2020-02-01 DIAGNOSIS — Z79899 Other long term (current) drug therapy: Secondary | ICD-10-CM | POA: Diagnosis not present

## 2020-02-01 DIAGNOSIS — Z803 Family history of malignant neoplasm of breast: Secondary | ICD-10-CM | POA: Insufficient documentation

## 2020-02-01 DIAGNOSIS — Z7982 Long term (current) use of aspirin: Secondary | ICD-10-CM | POA: Diagnosis not present

## 2020-02-01 DIAGNOSIS — Z7984 Long term (current) use of oral hypoglycemic drugs: Secondary | ICD-10-CM | POA: Diagnosis not present

## 2020-02-01 DIAGNOSIS — G4730 Sleep apnea, unspecified: Secondary | ICD-10-CM | POA: Diagnosis not present

## 2020-02-01 DIAGNOSIS — Z6837 Body mass index (BMI) 37.0-37.9, adult: Secondary | ICD-10-CM | POA: Insufficient documentation

## 2020-02-01 DIAGNOSIS — K640 First degree hemorrhoids: Secondary | ICD-10-CM | POA: Insufficient documentation

## 2020-02-01 DIAGNOSIS — K5730 Diverticulosis of large intestine without perforation or abscess without bleeding: Secondary | ICD-10-CM | POA: Insufficient documentation

## 2020-02-01 DIAGNOSIS — E785 Hyperlipidemia, unspecified: Secondary | ICD-10-CM | POA: Insufficient documentation

## 2020-02-01 DIAGNOSIS — E1122 Type 2 diabetes mellitus with diabetic chronic kidney disease: Secondary | ICD-10-CM | POA: Diagnosis not present

## 2020-02-01 DIAGNOSIS — Z801 Family history of malignant neoplasm of trachea, bronchus and lung: Secondary | ICD-10-CM | POA: Insufficient documentation

## 2020-02-01 DIAGNOSIS — N189 Chronic kidney disease, unspecified: Secondary | ICD-10-CM | POA: Diagnosis not present

## 2020-02-01 DIAGNOSIS — E669 Obesity, unspecified: Secondary | ICD-10-CM | POA: Diagnosis not present

## 2020-02-01 DIAGNOSIS — I129 Hypertensive chronic kidney disease with stage 1 through stage 4 chronic kidney disease, or unspecified chronic kidney disease: Secondary | ICD-10-CM | POA: Diagnosis not present

## 2020-02-01 DIAGNOSIS — M109 Gout, unspecified: Secondary | ICD-10-CM | POA: Insufficient documentation

## 2020-02-01 DIAGNOSIS — Z1211 Encounter for screening for malignant neoplasm of colon: Secondary | ICD-10-CM | POA: Insufficient documentation

## 2020-02-01 HISTORY — PX: COLONOSCOPY WITH PROPOFOL: SHX5780

## 2020-02-01 HISTORY — DX: Polyneuropathy, unspecified: G62.9

## 2020-02-01 LAB — GLUCOSE, CAPILLARY
Glucose-Capillary: 135 mg/dL — ABNORMAL HIGH (ref 70–99)
Glucose-Capillary: 116 mg/dL — ABNORMAL HIGH (ref 70–99)

## 2020-02-01 SURGERY — COLONOSCOPY WITH PROPOFOL
Anesthesia: General | Site: Rectum

## 2020-02-01 MED ORDER — ACETAMINOPHEN 10 MG/ML IV SOLN: 1000.0000 mg | Freq: Once | INTRAVENOUS | Status: DC | PRN

## 2020-02-01 MED ORDER — PROPOFOL 10 MG/ML IV BOLUS
INTRAVENOUS | Status: DC | PRN
  Administered 2020-02-01 (×3): 40 mg via INTRAVENOUS
  Administered 2020-02-01: 20 mg via INTRAVENOUS
  Administered 2020-02-01: 100 mg via INTRAVENOUS
  Administered 2020-02-01: 20 mg via INTRAVENOUS

## 2020-02-01 MED ORDER — SODIUM CHLORIDE 0.9 % IV SOLN: INTRAVENOUS | Status: DC

## 2020-02-01 MED ORDER — ONDANSETRON HCL 4 MG/2ML IJ SOLN: 4.0000 mg | Freq: Once | INTRAMUSCULAR | Status: DC | PRN

## 2020-02-01 MED ORDER — LACTATED RINGERS IV SOLN: INTRAVENOUS | Status: DC

## 2020-02-01 MED ORDER — LIDOCAINE HCL (CARDIAC) PF 100 MG/5ML IV SOSY
PREFILLED_SYRINGE | INTRAVENOUS | Status: DC | PRN
  Administered 2020-02-01: 30 mg via INTRAVENOUS

## 2020-02-01 MED ORDER — STERILE WATER FOR IRRIGATION IR SOLN: Status: DC | PRN

## 2020-02-01 SURGICAL SUPPLY — 5 items
GOWN CVR UNV OPN BCK APRN NK (MISCELLANEOUS) ×2 IMPLANT
GOWN ISOL THUMB LOOP REG UNIV (MISCELLANEOUS) ×6
KIT ENDO PROCEDURE OLY (KITS) ×3 IMPLANT
MANIFOLD NEPTUNE II (INSTRUMENTS) ×3 IMPLANT
WATER STERILE IRR 250ML POUR (IV SOLUTION) ×3 IMPLANT

## 2020-02-01 NOTE — Op Note (Signed)
[facility] Gastroenterology Patient Name: [place] Procedure Date: 02/01/2020 7:40 AM MRN: [id number] Account #: [id number] Date of Birth: 1946/02/13 Admit Type: Outpatient Age: 74 Room: [facility] OR ROOM 01 Gender: Female Note Status: Finalized Procedure:             Colonoscopy Indications:           Screening for colorectal malignant neoplasm Providers:             [name] MD, MD Referring MD:          Valerie Roys (Referring MD) Medicines:             Propofol per Anesthesia Complications:         No immediate complications. Procedure:             Pre-Anesthesia Assessment:                        - Prior to the procedure, a History and Physical was                         performed, and patient medications and allergies were                         reviewed. The patient's tolerance of previous                         anesthesia was also reviewed. The risks and benefits                         of the procedure and the sedation options and risks                         were discussed with the patient. All questions were                         answered, and informed consent was obtained. Prior                         Anticoagulants: The patient has taken no previous                         anticoagulant or antiplatelet agents. ASA Grade                         Assessment: II - A patient with mild systemic disease.                         After reviewing the risks and benefits, the patient                         was deemed in satisfactory condition to undergo the                         procedure.                        After obtaining informed consent, the colonoscope was  passed under direct vision. Throughout the procedure,                         the patient's blood pressure, pulse, and oxygen                         saturations were monitored continuously. The was                         introduced through the anus and  advanced to the the                         cecum, identified by appendiceal orifice and ileocecal                         valve. The colonoscopy was performed without                         difficulty. The patient tolerated the procedure well.                         The quality of the bowel preparation was excellent. Findings:      The perianal and digital rectal examinations were normal.      Multiple small-mouthed diverticula were found in the sigmoid colon.      Non-bleeding internal hemorrhoids were found during retroflexion. The       hemorrhoids were Grade I (internal hemorrhoids that do not prolapse). Impression:            - Diverticulosis in the sigmoid colon.                        - Non-bleeding internal hemorrhoids.                        - No specimens collected. Recommendation:        - Discharge patient to home.                        - Resume previous diet.                        - Continue present medications. Procedure Code(s):     --- Professional ---                        [phone], Colonoscopy, flexible; diagnostic, including                         collection of specimen(s) by brushing or washing, when                         performed (separate procedure) Diagnosis Code(s):     --- Professional ---                        Z12.11, Encounter for screening for malignant neoplasm                         of colon CPT copyright 2019 American Medical Association. All rights reserved. The codes documented in this report are preliminary  and upon coder review may  be revised to meet current compliance requirements. [name] MD, MD 02/01/2020 8:11:01 AM This report has been signed electronically. Number of Addenda: 0 Note Initiated On: 02/01/2020 7:40 AM Scope Withdrawal Time: 0 hours 8 minutes 58 seconds  Total Procedure Duration: 0 hours 15 minutes 57 seconds  Estimated Blood Loss:  Estimated blood loss: none.      [facility]

## 2020-02-01 NOTE — Anesthesia Postprocedure Evaluation (Addendum)
Anesthesia Post Note  Patient: Catherine Hinton  Procedure(s) Performed: COLONOSCOPY WITH PROPOFOL (N/A Rectum)     Patient location during evaluation: PACU Anesthesia Type: General Level of consciousness: awake and alert Pain management: pain level controlled Vital Signs Assessment: post-procedure vital signs reviewed and stable Respiratory status: spontaneous breathing, nonlabored ventilation, respiratory function stable and patient connected to nasal cannula oxygen Cardiovascular status: blood pressure returned to baseline and stable Postop Assessment: no apparent nausea or vomiting Anesthetic complications: no   No complications documented.  Karene Bracken A  Kenna Kirn

## 2020-02-01 NOTE — H&P (Signed)
[name], MD [name] [address] [place], [place] [address] Phone: [phone] Fax : [phone]  Primary Care Physician:  [name], NP Primary Gastroenterologist:  [name]  Pre-Procedure History & Physical: HPI:  Catherine Hinton is a 74 y.o. female is here for a screening colonoscopy.   Past Medical History:  Diagnosis Date  . Chronic kidney disease 04/25/2015  . Diabetes mellitus with renal manifestation ([place])   . Diabetes mellitus without complication ([place])   . Gout   . Hyperlipidemia   . Hypertension   . Hypertensive CKD (chronic kidney disease)   . Neuropathy    feet  . Obesity   . Sleep apnea    CPAP    Past Surgical History:  Procedure Laterality Date  . ABDOMINAL HYSTERECTOMY    . CHOLECYSTECTOMY    . HERNIA REPAIR    . THUMB FUSION      Prior to Admission medications   Medication Sig Start Date End Date Taking? Authorizing Provider  allopurinol (ZYLOPRIM) 300 MG tablet Take 1 tablet (300 mg total) by mouth daily. 10/02/19  Yes Cannady, Henrine Screws T, NP  aspirin 81 MG EC tablet Take 81 mg by mouth daily.    Yes [provider]  B Complex-C-Folic Acid (SUPER B COMPLEX/FA/VIT C PO) Take 1 tablet by mouth daily.   Yes [provider]  Cholecalciferol (VITAMIN D-1000 MAX ST) 1000 UNITS tablet Take 1,000 Units by mouth daily.   Yes [provider]  DHA-EPA-Flaxseed Oil-Vitamin E (THERA TEARS NUTRITION PO) Take 1 mg by mouth 2 (two) times daily.    Yes [provider]  gabapentin (NEURONTIN) 300 MG capsule TAKE 1 CAPSULE THREE TIMES DAILY 01/27/20  Yes Cannady, [name] T, NP  gemfibrozil (LOPID) 600 MG tablet TAKE 1 TABLET TWICE DAILY 08/13/19  Yes Cannady, [name] T, NP  metFORMIN (GLUCOPHAGE) 500 MG tablet TAKE 2 TABLETS TWICE DAILY 01/27/20  Yes Cannady, [name] T, NP  metoprolol succinate (TOPROL-XL) 25 MG 24 hr tablet Take 1 tablet (25 mg total) by mouth daily. 10/02/19  Yes Cannady, [name] T, NP  Multiple  Vitamins-Minerals (EYE VITAMINS PO) Take 1 tablet by mouth daily.   Yes [provider]  valsartan-hydrochlorothiazide (DIOVAN-HCT) 160-12.5 MG tablet Take 1 tablet by mouth daily. 10/02/19  Yes Cannady, [name] T, NP  XIIDRA 5 % SOLN Place 1 drop into both eyes 2 (two) times daily.  04/21/15  Yes [provider]    Allergies as of 01/06/2020 - Review Complete 01/06/2020  Allergen Reaction Noted  . Niacin Itching 12/01/2014  . Sulfa antibiotics Other (See Comments) 12/01/2014    Family History  Problem Relation Age of Onset  . Diabetes Mother   . Cancer Mother        Pituitary tumor  . Hyperlipidemia Mother   . Hypertension Mother   . Thyroid disease Mother   . Stroke Mother   . Cancer Father        Lung  . Cancer Brother        Oral  . Hypertension Son   . Cancer Maternal Grandmother        colon  . Blindness Maternal Grandfather   . Arthritis Paternal Grandmother   . Arthritis Daughter        RA  . Breast cancer Neg Hx     Social History   Socioeconomic History  . Marital status: Divorced    Spouse name: Not on file  . Number of children: Not on  file  . Years of education: Not on file  . Highest education level: Associate degree: academic program  Occupational History  . Occupation: retired  Tobacco Use  . Smoking status: Never Smoker  . Smokeless tobacco: Never Used  Vaping Use  . Vaping Use: Never used  Substance and Sexual Activity  . Alcohol use: No    Alcohol/week: 0.0 standard drinks  . Drug use: No  . Sexual activity: Not Currently  Other Topics Concern  . Not on file  Social History Narrative  . Not on file   Social Determinants of Health   Financial Resource Strain: Low Risk   . Difficulty of Paying Living Expenses: Not very hard  Food Insecurity: No Food Insecurity  . Worried About Charity fundraiser in the Last Year: Never true  . Ran Out of Food in the Last Year: Never true  Transportation Needs: No Transportation Needs    . Lack of Transportation (Medical): No  . Lack of Transportation (Non-Medical): No  Physical Activity: Inactive  . Days of Exercise per Week: 0 days  . Minutes of Exercise per Session: 0 min  Stress: No Stress Concern Present  . Feeling of Stress : Not at all  Social Connections:   . Frequency of Communication with Friends and Family:   . Frequency of Social Gatherings with Friends and Family:   . Attends Religious Services:   . Active Member of Clubs or Organizations:   . Attends Archivist Meetings:   [name] Marital Status:   Intimate Partner Violence:   . Fear of Current or Ex-Partner:   . Emotionally Abused:   [name] Physically Abused:   . Sexually Abused:     Review of Systems: See HPI, otherwise negative ROS  Physical Exam: BP 126/68   Pulse (!) 104   Temp (!) 97 F (36.1 C) (Temporal)   Ht 5\' 3"  (1.6 m)   Wt 96.2 kg   LMP  (LMP Unknown)   SpO2 95%   BMI 37.55 kg/m  General:   Alert,  pleasant and cooperative in NAD Head:  Normocephalic and atraumatic. Neck:  Supple; no masses or thyromegaly. Lungs:  Clear throughout to auscultation.    Heart:  Regular rate and rhythm. Abdomen:  Soft, nontender and nondistended. Normal bowel sounds, without guarding, and without rebound.   Neurologic:  Alert and  oriented x4;  grossly normal neurologically.  Impression/Plan: [place] is now here to undergo a screening colonoscopy.  Risks, benefits, and alternatives regarding colonoscopy have been reviewed with the patient.  Questions have been answered.  All parties agreeable.

## 2020-02-01 NOTE — Anesthesia Procedure Notes (Signed)
Date/Time: 02/01/2020 7:50 AM Performed by: Cameron Ali, CRNA Pre-anesthesia Checklist: Patient identified, Emergency Drugs available, Suction available, Timeout performed and Patient being monitored Patient Re-evaluated:Patient Re-evaluated prior to induction Oxygen Delivery Method: Nasal cannula Placement Confirmation: positive ETCO2

## 2020-02-01 NOTE — Transfer of Care (Signed)
Immediate Anesthesia Transfer of Care Note  Patient: Catherine Hinton  Procedure(s) Performed: COLONOSCOPY WITH PROPOFOL (N/A Rectum)  Patient Location: PACU  Anesthesia Type: General  Level of Consciousness: awake, alert  and patient cooperative  Airway and Oxygen Therapy: Patient Spontanous Breathing and Patient connected to supplemental oxygen  Post-op Assessment: Post-op Vital signs reviewed, Patient's Cardiovascular Status Stable, Respiratory Function Stable, Patent Airway and No signs of Nausea or vomiting  Post-op Vital Signs: Reviewed and stable  Complications: No complications documented.

## 2020-02-02 ENCOUNTER — Encounter: Payer: Self-pay | Admitting: Gastroenterology

## 2020-02-03 ENCOUNTER — Other Ambulatory Visit: Payer: Self-pay

## 2020-02-03 ENCOUNTER — Ambulatory Visit
Admission: RE | Admit: 2020-02-03 | Discharge: 2020-02-03 | Disposition: A | Discharge status: Home / Self Care | Payer: Medicare Other | Source: Ambulatory Visit | Attending: Nurse Practitioner | Admitting: Nurse Practitioner

## 2020-02-03 DIAGNOSIS — Z1231 Encounter for screening mammogram for malignant neoplasm of breast: Secondary | ICD-10-CM | POA: Insufficient documentation

## 2020-02-03 DIAGNOSIS — Z780 Asymptomatic menopausal state: Secondary | ICD-10-CM

## 2020-02-03 DIAGNOSIS — Z90722 Acquired absence of ovaries, bilateral: Secondary | ICD-10-CM | POA: Diagnosis not present

## 2020-02-03 DIAGNOSIS — Z90710 Acquired absence of both cervix and uterus: Secondary | ICD-10-CM | POA: Diagnosis not present

## 2020-02-03 DIAGNOSIS — M85851 Other specified disorders of bone density and structure, right thigh: Secondary | ICD-10-CM | POA: Diagnosis not present

## 2020-02-04 NOTE — Progress Notes (Signed)
Contacted via [place]  Your bone density shows thinning bones (osteopenia) but not brittle (osteoporosis). We recommend Vitamin D supplementation of about 2,0000 IUs of over the counter Vitamin D3. In addition, we recommend a diet high in calcium with dairy and dark green leafy vegetables. We would like you to get plenty of weight bearing exercises with walking and resistance training such as light weights or resistance bands available with instructions at places such as [employer].  Keep being awesome!!  Thank you for allowing me to participate in your care. Kindest regards, Venise Ellingwood

## 2020-02-23 LAB — HM DIABETES EYE EXAM

## 2020-02-24 DIAGNOSIS — H02883 Meibomian gland dysfunction of right eye, unspecified eyelid: Secondary | ICD-10-CM | POA: Diagnosis not present

## 2020-02-24 DIAGNOSIS — H02886 Meibomian gland dysfunction of left eye, unspecified eyelid: Secondary | ICD-10-CM | POA: Diagnosis not present

## 2020-02-24 DIAGNOSIS — E119 Type 2 diabetes mellitus without complications: Secondary | ICD-10-CM | POA: Diagnosis not present

## 2020-02-24 DIAGNOSIS — H401131 Primary open-angle glaucoma, bilateral, mild stage: Secondary | ICD-10-CM | POA: Diagnosis not present

## 2020-02-24 DIAGNOSIS — H04129 Dry eye syndrome of unspecified lacrimal gland: Secondary | ICD-10-CM | POA: Diagnosis not present

## 2020-02-24 DIAGNOSIS — H26492 Other secondary cataract, left eye: Secondary | ICD-10-CM | POA: Diagnosis not present

## 2020-02-24 DIAGNOSIS — H353131 Nonexudative age-related macular degeneration, bilateral, early dry stage: Secondary | ICD-10-CM | POA: Diagnosis not present

## 2020-03-24 ENCOUNTER — Other Ambulatory Visit: Payer: Self-pay | Admitting: Nurse Practitioner

## 2020-03-26 DIAGNOSIS — Z23 Encounter for immunization: Secondary | ICD-10-CM | POA: Diagnosis not present

## 2020-04-08 ENCOUNTER — Ambulatory Visit (INDEPENDENT_AMBULATORY_CARE_PROVIDER_SITE_OTHER): Payer: Medicare Other | Admitting: Nurse Practitioner

## 2020-04-08 ENCOUNTER — Other Ambulatory Visit: Payer: Self-pay

## 2020-04-08 ENCOUNTER — Encounter: Payer: Self-pay | Admitting: Nurse Practitioner

## 2020-04-08 ENCOUNTER — Telehealth: Payer: No Typology Code available for payment source

## 2020-04-08 VITALS — BP 135/75 | HR 90 | Temp 99.3°F | Resp 16 | Ht 63.0 in | Wt 222.0 lb

## 2020-04-08 DIAGNOSIS — I152 Hypertension secondary to endocrine disorders: Secondary | ICD-10-CM

## 2020-04-08 DIAGNOSIS — E1159 Type 2 diabetes mellitus with other circulatory complications: Secondary | ICD-10-CM | POA: Diagnosis not present

## 2020-04-08 DIAGNOSIS — E538 Deficiency of other specified B group vitamins: Secondary | ICD-10-CM | POA: Diagnosis not present

## 2020-04-08 DIAGNOSIS — Z23 Encounter for immunization: Secondary | ICD-10-CM

## 2020-04-08 DIAGNOSIS — Z6839 Body mass index (BMI) 39.0-39.9, adult: Secondary | ICD-10-CM

## 2020-04-08 DIAGNOSIS — G4733 Obstructive sleep apnea (adult) (pediatric): Secondary | ICD-10-CM

## 2020-04-08 DIAGNOSIS — E785 Hyperlipidemia, unspecified: Secondary | ICD-10-CM

## 2020-04-08 DIAGNOSIS — E1140 Type 2 diabetes mellitus with diabetic neuropathy, unspecified: Secondary | ICD-10-CM

## 2020-04-08 DIAGNOSIS — E1169 Type 2 diabetes mellitus with other specified complication: Secondary | ICD-10-CM | POA: Diagnosis not present

## 2020-04-08 DIAGNOSIS — M8580 Other specified disorders of bone density and structure, unspecified site: Secondary | ICD-10-CM | POA: Insufficient documentation

## 2020-04-08 LAB — BAYER DCA HB A1C WAIVED: HB A1C (BAYER DCA - WAIVED): 6.5 % (ref <7.0)

## 2020-04-08 MED ORDER — GABAPENTIN 300 MG PO CAPS: ORAL_CAPSULE | ORAL | 2 refills | Status: DC

## 2020-04-08 NOTE — Assessment & Plan Note (Signed)
Ongoing.  Continue daily supplement and recheck level next visit.  She is on chronic Metformin, which can cause lower B12 levels.  Educated her on this.  May consider monthly injections for a bit if level not improved with oral supplement.

## 2020-04-08 NOTE — Assessment & Plan Note (Signed)
Chronic, ongoing.  A1C 6.5% today, staying stable, praised for this.  Continue Metformin 1000 MG BID and adjust regimen as needed.  Focus on diabetic diet at home and regular exercise.  Continue Gabapentin daily for neuropathy, recommend she take 300 MG in morning and 600 Mg at night, and B12.  Will recheck B12 level next visit.  Return in 3 months.

## 2020-04-08 NOTE — Progress Notes (Signed)
BP 135/75 (BP Location: Left Arm, Patient Position: Sitting, Cuff Size: Large)   Pulse 90 Comment: apical  Temp 99.3 F (37.4 C) (Oral)   Resp 16   Ht 5\' 3"  (1.6 m)   Wt 222 lb (100.7 kg)   LMP  (LMP Unknown)   SpO2 97%   BMI 39.33 kg/m    Subjective:    Patient ID: [place], female    DOB: 1946-03-18, 74 y.o.   MRN: [id number]  HPI: Catherine Hinton is a 74 y.o. female  Chief Complaint  Patient presents with  . Diabetes   DIABETES Last A1C was 6.5% in July.  Continues on Metformin 500 MG twice daily. Takes Gabapentin 300 MG TID for neuropathy -- helps but discomfort is worse at night.  Last visit B12 level was 290, some improvement -- continues taking B12 daily.  Hypoglycemic episodes:no Polydipsia/polyuria: no Visual disturbance: no Chest pain: no Paresthesias: no Glucose Monitoring: yes  Accucheck frequency: Daily  Fasting glucose: 130 to 154  Post prandial:  Evening:  Before meals: Taking Insulin?: no  Long acting insulin:  Short acting insulin: Blood Pressure Monitoring: daily Retinal Examination: Up to Date Foot Exam: Up to Date Pneumovax: Up to Date Influenza: Up to Date Aspirin: yes   HYPERTENSION / HYPERLIPIDEMIA Continues on Metoprolol and Valsartan-HCTZ, ASA and Lopid.  Has OSA and uses CPAP 100%. Satisfied with current treatment? yes Duration of hypertension: chronic BP monitoring frequency: daily BP range: 110-120/70 at home BP medication side effects: no Duration of hyperlipidemia: chronic Cholesterol medication side effects: no Cholesterol supplements: none Medication compliance: good compliance Aspirin: yes Recent stressors: no Recurrent headaches: no Visual changes: no Palpitations: no Dyspnea: no Chest pain: no Lower extremity edema: baseline, no worse Dizzy/lightheaded: no   Relevant past medical, surgical, family and social history reviewed and updated as indicated. Interim medical history since our last visit  reviewed. Allergies and medications reviewed and updated.  Review of Systems  Constitutional: Negative for activity change, appetite change, diaphoresis, fatigue and fever.  Respiratory: Negative for cough, chest tightness, shortness of breath and wheezing.   Cardiovascular: Negative for chest pain, palpitations and leg swelling.  Gastrointestinal: Negative.   Endocrine: Negative for cold intolerance, heat intolerance, polydipsia, polyphagia and polyuria.  Genitourinary: Negative.   Neurological: Negative.   Psychiatric/Behavioral: Negative.     Per HPI unless specifically indicated above     Objective:    BP 135/75 (BP Location: Left Arm, Patient Position: Sitting, Cuff Size: Large)   Pulse 90 Comment: apical  Temp 99.3 F (37.4 C) (Oral)   Resp 16   Ht 5\' 3"  (1.6 m)   Wt 222 lb (100.7 kg)   LMP  (LMP Unknown)   SpO2 97%   BMI 39.33 kg/m   Wt Readings from Last 3 Encounters:  04/08/20 222 lb (100.7 kg)  02/01/20 212 lb (96.2 kg)  01/01/20 220 lb (99.8 kg)    Physical Exam Vitals and nursing note reviewed.  Constitutional:      General: She is awake. She is not in acute distress.    Appearance: She is well-developed. She is obese. She is not ill-appearing.  HENT:     Head: Normocephalic.     Right Ear: Hearing, tympanic membrane and ear canal normal. No drainage.     Left Ear: Hearing, tympanic membrane, ear canal and external ear normal. No drainage.     Nose: Nose normal.     Mouth/Throat:     Mouth: Mucous membranes are  moist.     Pharynx: Oropharynx is clear.  Eyes:     General: Lids are normal.        Right eye: No discharge.        Left eye: No discharge.     Conjunctiva/sclera: Conjunctivae normal.     Pupils: Pupils are equal, round, and reactive to light.  Neck:     Vascular: No carotid bruit.  Cardiovascular:     Rate and Rhythm: Normal rate and regular rhythm.     Heart sounds: Normal heart sounds. No murmur heard.  No gallop.   Pulmonary:      Effort: Pulmonary effort is normal. No accessory muscle usage or respiratory distress.     Breath sounds: Normal breath sounds.  Abdominal:     General: Bowel sounds are normal.     Palpations: Abdomen is soft. There is no hepatomegaly or splenomegaly.     Tenderness: There is no abdominal tenderness. There is no right CVA tenderness or left CVA tenderness.  Musculoskeletal:     Cervical back: Normal range of motion and neck supple.     Right lower leg: No edema.     Left lower leg: No edema.  Lymphadenopathy:     Cervical: No cervical adenopathy.  Skin:    General: Skin is warm and dry.  Neurological:     Mental Status: She is alert and oriented to person, place, and time.  Psychiatric:        Attention and Perception: Attention normal.        Mood and Affect: Mood normal.        Behavior: Behavior normal. Behavior is cooperative.        Thought Content: Thought content normal.        Judgment: Judgment normal.     Results for orders placed or performed in visit on 02/24/20  HM DIABETES EYE EXAM  Result Value Ref Range   HM Diabetic Eye Exam No Retinopathy No Retinopathy      Assessment & Plan:   Problem List Items Addressed This Visit      Cardiovascular and Mediastinum   Hypertension associated with diabetes ([place])    Chronic, stable with BP below goal on home readings and at goal in office. Continue current medication regimen and adjust as needed based on BP readings + focus on DASH diet.  BMP today. Could consider discontinuation of HCTZ at upcoming visit, as BP well controlled and history of gout.  Return in 3 months.      Relevant Orders   Bayer DCA Hb A1c Waived   Basic metabolic panel     Respiratory   Obstructive sleep apnea    Continue nightly use, praised for this.        Endocrine   Hyperlipidemia associated with type 2 diabetes mellitus (HCC)    Chronic, stable.  Continue current medication regimen and adjust as needed.  Lipid panel today.  April LDL  <70.      Relevant Orders   Bayer DCA Hb A1c Waived   Lipid Panel w/o Chol/HDL Ratio   Type 2 diabetes mellitus with diabetic neuropathy, unspecified (HCC) - Primary    Chronic, ongoing.  A1C 6.5% today, staying stable, praised for this.  Continue Metformin 1000 MG BID and adjust regimen as needed.  Focus on diabetic diet at home and regular exercise.  Continue Gabapentin daily for neuropathy, recommend she take 300 MG in morning and 600 Mg at night, and B12.  Will  recheck B12 level next visit.  Return in 3 months.      Relevant Orders   Bayer DCA Hb A1c Waived     Other   Morbid obesity ([place])    BMI 39.33.  Recommended eating smaller high protein, low fat meals more frequently and exercising 30 mins a day 5 times a week with a goal of 10-15lb weight loss in the next 3 months. Patient voiced their understanding and motivation to adhere to these recommendations.       BMI 39.0-39.9,adult    With T2DM.  Recommend heavy focus on healthy diet and regular exercise regimen.      B12 deficiency    Ongoing.  Continue daily supplement and recheck level next visit.  She is on chronic Metformin, which can cause lower B12 levels.  Educated her on this.  May consider monthly injections for a bit if level not improved with oral supplement.       Other Visit Diagnoses    Need for influenza vaccination       Relevant Orders   Flu Vaccine QUAD High Dose(Fluad) (Completed)       Follow up plan: Return in about 3 months (around 07/09/2020).

## 2020-04-08 NOTE — Assessment & Plan Note (Signed)
Continue nightly use, praised for this.

## 2020-04-08 NOTE — Assessment & Plan Note (Signed)
Chronic, stable with BP below goal on home readings and at goal in office. Continue current medication regimen and adjust as needed based on BP readings + focus on DASH diet.  BMP today. Could consider discontinuation of HCTZ at upcoming visit, as BP well controlled and history of gout.  Return in 3 months.

## 2020-04-08 NOTE — Assessment & Plan Note (Signed)
With T2DM.  Recommend heavy focus on healthy diet and regular exercise regimen.

## 2020-04-08 NOTE — Assessment & Plan Note (Addendum)
Chronic, stable.  Continue current medication regimen and adjust as needed.  Lipid panel today.  April LDL <70.

## 2020-04-08 NOTE — Assessment & Plan Note (Signed)
BMI 39.33.  Recommended eating smaller high protein, low fat meals more frequently and exercising 30 mins a day 5 times a week with a goal of 10-15lb weight loss in the next 3 months. Patient voiced their understanding and motivation to adhere to these recommendations.

## 2020-04-08 NOTE — Patient Instructions (Signed)

## 2020-04-09 LAB — LIPID PANEL W/O CHOL/HDL RATIO
Cholesterol, Total: 127 mg/dL (ref 100–199)
HDL: 32 mg/dL — ABNORMAL LOW (ref >39)
LDL Chol Calc (NIH): 60 mg/dL (ref 0–99)
Triglycerides: 211 mg/dL — ABNORMAL HIGH (ref 0–149)
VLDL Cholesterol Cal: 35 mg/dL (ref 5–40)

## 2020-04-09 LAB — BASIC METABOLIC PANEL
BUN/Creatinine Ratio: 17 (ref 12–28)
BUN: 10 mg/dL (ref 8–27)
CO2: 24 mmol/L (ref 20–29)
Calcium: 9.7 mg/dL (ref 8.7–10.3)
Chloride: 99 mmol/L (ref 96–106)
Creatinine, Ser: 0.59 mg/dL (ref 0.57–1.00)
GFR calc Af Amer: 106 mL/min/{1.73_m2} (ref >59)
GFR calc non Af Amer: 92 mL/min/{1.73_m2} (ref >59)
Glucose: 246 mg/dL — ABNORMAL HIGH (ref 65–99)
Potassium: 4 mmol/L (ref 3.5–5.2)
Sodium: 140 mmol/L (ref 134–144)

## 2020-04-10 NOTE — Progress Notes (Signed)
Contacted via [place] morning [place], your labs have returned and continue to be stable.  Continue all your current medications.  You are doing fantastic!! Keep being awesome!!  Thank you for allowing me to participate in your care. Kindest regards, Kalandra Masters

## 2020-04-13 ENCOUNTER — Ambulatory Visit: Payer: Medicare Other | Admitting: Pharmacist

## 2020-04-13 NOTE — Progress Notes (Signed)
Chronic Care Management Pharmacy  Name: Catherine Hinton  MRN: [id number] DOB: [birth year]   Chief Complaint/ HPI  Merkel,  74 y.o. , female presents for their Follow-Up CCM visit with the clinical pharmacist via telephone.  PCP : [name], NP Patient Care Team: [name], NP as PCP - General (Nurse Practitioner) Anell Barr, OD (Optometry) Vladimir Faster, [facility] (Pharmacist)  Their chronic conditions include: Hypertension, Hyperlipidemia, Diabetes, Osteopenia, Gout and Vitamin D/B12 Deficiencies   Office Visits: 10/22-21- Marnee Guarneri, NP- blood work, gabapentin increased to 300 qam 600 qpm, return in 3 mos, consider d/d hctz at next visit    Allergies  Allergen Reactions  . Niacin Itching  . Sulfa Antibiotics Other (See Comments)    Fever    Medications: Outpatient Encounter Medications as of 04/13/2020  Medication Sig Note  . allopurinol (ZYLOPRIM) 300 MG tablet Take 1 tablet (300 mg total) by mouth daily.   . B Complex-C-Folic Acid (SUPER B COMPLEX/FA/VIT C PO) Take 1 tablet by mouth daily.   . Cholecalciferol (VITAMIN D-1000 MAX ST) 1000 UNITS tablet Take 2,000 Units by mouth daily.  12/01/2014: Received from: [place]: Take by mouth.  . gabapentin (NEURONTIN) 300 MG capsule TAKE 1 CAPSULE IN THE MORNING AND TWO CAPSULES AT NIGHT BEFORE BED TIME.   [name] gemfibrozil (LOPID) 600 MG tablet TAKE 1 TABLET TWICE DAILY   . metFORMIN (GLUCOPHAGE) 500 MG tablet TAKE 2 TABLETS TWICE DAILY   . valsartan-hydrochlorothiazide (DIOVAN-HCT) 160-12.5 MG tablet Take 1 tablet by mouth daily.   [name] XIIDRA 5 % SOLN Place 1 drop into both eyes 2 (two) times daily.  04/25/2015: Received from: External Pharmacy  . aspirin 81 MG EC tablet Take 81 mg by mouth daily.    . DHA-EPA-Flaxseed Oil-Vitamin E (THERA TEARS NUTRITION PO) Take 1 mg by mouth 2 (two) times daily.    . metoprolol succinate (TOPROL-XL) 25 MG 24 hr tablet Take 1 tablet  (25 mg total) by mouth daily.   . Multiple Vitamins-Minerals (EYE VITAMINS PO) Take 1 tablet by mouth daily.    No facility-administered encounter medications on file as of 04/13/2020.    Wt Readings from Last 3 Encounters:  04/08/20 222 lb (100.7 kg)  02/01/20 212 lb (96.2 kg)  01/01/20 220 lb (99.8 kg)    Current Diagnosis/Assessment:    Goals Addressed            This Visit's Progress   . Chronic Care Management -PharmD       CARE PLAN ENTRY (see longitudinal plan of care for additional care plan information)  Current Barriers:  . Chronic Disease Management support, education, and care coordination needs related to Hypertension, Hyperlipidemia, Diabetes, Gout, and Vitamin D and B12 Deficiency   Hypertension BP Readings from Last 3 Encounters:  10/02/19 127/74  09/18/19 115/75  06/24/19 105/73   . Pharmacist Clinical Goal(s): o Over the next 90 days, patient will work with PharmD and providers to maintain BP goal <130/80 . Current regimen:  Alphonse Guild 160-12.60m qd . Metoprolol succinate 25 mg qd . Interventions: o Discussed previous cardiologist started B blocker for tachycardia due to "small heart" . Patient self care activities - Over the next 90 days, patient will: o Check BP 1-2 times document, and provide at future appointments o Ensure daily salt intake < 2300 mg/day  Hyperlipidemia Lab Results  Component Value Date/Time   LDLCALC 64 10/02/2019 10:43 AM   . Pharmacist Clinical  Goal(s): o Over the next 90 days, patient will work with PharmD and providers to maintain LDL goal < 70 . Current regimen:  . Aspirin 81 mg daily . Gemfibrozil 66m bid . DHA-EPA-Flaxseed oil- Vit E 1200 mg qd . Interventions:  Provided diet and exercise counseling. . Patient self care activities - Over the next 90 days, patient will: o Continue eating a healty diet, increased vegetables and limited trans and saturated fats and sodium. o Increase exercise as  tolerated  Diabetes Lab Results  Component Value Date/Time   HGBA1C 6.5 10/02/2019 10:41 AM   HGBA1C 8.0 (H) 07/10/2019 09:07 AM   HGBA1C 6.8 11/06/2017 12:00 AM   . Pharmacist Clinical Goal(s): o Over the next 90 days, patient will work with PharmD and providers to maintain A1c goal <7% . Current regimen:  . Metformin 1000 mg twice daily(takes 2 5064m) . Gabapentin 300 mg three times daily . Interventions: Provided diet and exercise counseling. . Patient self care activities - Over the next 90 days, patient will: o Check blood sugar in the morning before eating or drinking, document, and provide at future appointments o Contact provider with any episodes of hypoglycemia  . Medication management . Pharmacist Clinical Goal(s): o Over the next 90 days, patient will work with PharmD and providers to maintain optimal medication adherence . [facility] . Interventions o Comprehensive medication review performed. o Continue current medication management strategy . Patient self care activities - Over the next 90 days, patient will: o Focus on medication adherence by continued use of pill box. o Take medications as prescribed o Report any questions or concerns to PharmD and/or provider(s)  Initial goal documentation        Diabetes with Neuropathy   A1c goal <7%  Recent Relevant Labs: Lab Results  Component Value Date/Time   HGBA1C 6.5 04/08/2020 09:32 AM   HGBA1C 6.5 01/01/2020 08:52 AM   HGBA1C 6.8 11/06/2017 12:00 AM   MICROALBUR 10 07/10/2019 09:07 AM   MICROALBUR 80 (H) 12/09/2018 08:20 AM    Last diabetic Eye exam:  Lab Results  Component Value Date/Time   HMDIABEYEEXA No Retinopathy 02/23/2020 12:00 AM    Last diabetic Foot exam:  Lab Results  Component Value Date/Time   HMDIABFOOTEX PP 11/02/2014 12:00 AM    BMP Latest Ref Rng & Units 04/08/2020 01/01/2020 10/02/2019  Glucose 65 - 99 mg/dL 246(H) 152(H) 118(H)  BUN 8 - 27 mg/dL '10  10 11  ' Creatinine 0.57 - 1.00 mg/dL 0.59 0.59 0.62  BUN/Creat Ratio 12 - '28 17 17 18  ' Sodium 134 - 144 mmol/L 140 139 141  Potassium 3.5 - 5.2 mmol/L 4.0 4.2 4.4  Chloride 96 - 106 mmol/L 99 99 100  CO2 20 - 29 mmol/L '24 24 24  ' Calcium 8.7 - 10.3 mg/dL 9.7 9.6 9.6   EGFr~ 9219min Checking BG: Daily  Recent FBG Readings: 171 today, 149.164,144,157  Patient has failed these meds in past: NA Patient is currently controlled on the following medications: . MMarland Kitchentformin 1000 mg bid . Gabapentin 300 qam 600m40mm  We discussed: Back pain from neuropathy last night inhibited sleep. She has been taking additional dose of gabapentin 300 mg qam since Friday as suggested by [name]. She describes the pain as radiating from her back down her legs to her feet. Denies any falls or injury to back. Reports this has been problematic for a couple of months. Advised her to contact PCP if symptoms worsen or  do not improve.  Plan:  Continue Current medications.  Hyperlipidemia   LDL goal < 70  Lipid Panel     Component Value Date/Time   CHOL 127 04/08/2020 0933   CHOL 225 (H) 12/09/2018 0820   TRIG 211 (H) 04/08/2020 0933   TRIG 115 12/09/2018 0820   HDL 32 (L) 04/08/2020 0933   LDLCALC 60 04/08/2020 0933    Hepatic Function Latest Ref Rng & Units 10/02/2019 09/18/2019 03/24/2019  Total Protein 6.0 - 8.5 g/dL 7.1 7.1 6.7  Albumin 3.7 - 4.7 g/dL 4.7 4.8(H) 4.5  AST 0 - 40 IU/L '16 19 21  ' ALT 0 - 32 IU/L '20 17 17  ' Alk Phosphatase 39 - 117 IU/L 46 48 49  Total Bilirubin 0.0 - 1.2 mg/dL 0.4 0.4 0.5     The ASCVD Risk score (Fulton., et al., 2013) failed to calculate for the following reasons:   The valid total cholesterol range is 130 to 320 mg/dL   Patient has failed these meds in past: NA Patient is currently controlled on the following medications:  . Gemfibrozil 600 mg bid . Aspirin 81 mg qd . DHA-EPA Flaxseed oil qd  We discussed:  diet and exercise extensively. Patient endorses an  overall healthy diet with a lot of vegetables, minimal processed or fast foods. She walks in her yard every day.  Plan  Continue current medications  .  Hypertension   BP goal is:  <130/80  Office blood pressures are  BP Readings from Last 3 Encounters:  04/08/20 135/75  02/01/20 119/75  01/01/20 125/74   Patient checks BP at home 3-5x per week Patient home BP readings are ranging: 120/68  Patient has failed these meds in the past:  Patient is currently controlled on the following medications:  [name] Metoprolol Succinate 25 mg qd . Valsartan HCTZ 160-12.5 mg qd  We discussed Patient was prescribed Toprol XL years ago by a cardiologist for tachycardia (HR.120 at rest) due to "small heart size". Reports her HR is normally 80-90s at rest. She limits salt and caffeine in diet. Patient has seen cardiology since 2011.   Plan  Continue current medications    . Osteopenia / Osteoporosis/ vitamin D deficiniency   Last DEXA Scan: 02/03/20  T-Score femoral neck: -1.4  T-Score lumbar spine: -0.4  10-year probability of major osteoporotic fracture: 14.4%  10-year probability of hip fracture: 1.9%  Vit D, 25-Hydroxy  Date Value Ref Range Status  10/02/2019 43.6 30.0 - 100.0 ng/mL Final    Comment:    Vitamin D deficiency has been defined by the Institute of Medicine and an Endocrine Society practice guideline as a level of serum 25-OH vitamin D less than 20 ng/mL (1,2). The Endocrine Society went on to further define vitamin D insufficiency as a level between 21 and 29 ng/mL (2). 1. IOM (Institute of Medicine). 2010. Dietary reference    intakes for calcium and D. [place]: The    [employer]. 2. Holick MF, Binkley Lebam, Bischoff-Ferrari HA, et al.    Evaluation, treatment, and prevention of vitamin D    deficiency: an Endocrine Society clinical practice    guideline. JCEM. 2011 Jul; 96(7):1911-30.      Patient is not a candidate for pharmacologic  treatment  Patient has failed these meds in past: Patient is currently uncontrolled on the following medications:  [name] Vitamin D 2000u qd    We discussed:  Recommend [phone] units of vitamin D daily. Recommend 1200  mg of calcium daily from dietary and supplemental sources. Recommend weight-bearing and muscle strengthening exercises for building and maintaining bone density. Patient does not recall taking calcium supplementation and doubts she gets enough calcium in her diet. She a history of thumb fracture.  Plan  Continue current medications. Recommend consider calcium supplementation for fracture prevention.  Vitamin B12 deficiency   Patient has failed these meds in past: NA Patient is currently controlled on the following medications:  . Bcomplex/c/folate 1 qd  We discussed:  Last level was a little low. Low B12 levels can worsen nerve pain and metformin therapy can decrease B12. PCP is planning to recheck levels at next visit.  Plan  Continue current medications. Recheck levels at next visit   Will discuss gout at next visit. Patient denies any recent flares.   Medication Management   Pt uses [place] [facility] for all medications Uses pill box? Yes Pt endorses 95 % compliance  We discussed: Current pharmacy is preferred with insurance plan and patient is satisfied with pharmacy services  Plan  Continue current medication management strategy    Follow up: 3  month phone visit  [name]. Kenton Kingfisher PharmD, [facility] [phone]

## 2020-04-13 NOTE — Patient Instructions (Addendum)
Visit Information  It was a pleasure speaking with you today. Thank you for letting me be part of your clinical team. Please call with any questions or concerns.   Goals Addressed            This Visit's Progress   . Chronic Care Management -PharmD       CARE PLAN ENTRY (see longitudinal plan of care for additional care plan information)  Current Barriers:  . Chronic Disease Management support, education, and care coordination needs related to Hypertension, Hyperlipidemia, Diabetes, Gout, and Vitamin D and B12 Deficiency   Hypertension BP Readings from Last 3 Encounters:  10/02/19 127/74  09/18/19 115/75  06/24/19 105/73   . Pharmacist Clinical Goal(s): o Over the next 90 days, patient will work with PharmD and providers to maintain BP goal <130/80 . Current regimen:  Alphonse Guild 160-12.5mg  qd . Metoprolol succinate 25 mg qd . Interventions: o Discussed previous cardiologist started B blocker for tachycardia due to "small heart" . Patient self care activities - Over the next 90 days, patient will: o Check BP 1-2 times document, and provide at future appointments o Ensure daily salt intake < 2300 mg/day  Hyperlipidemia Lab Results  Component Value Date/Time   LDLCALC 64 10/02/2019 10:43 AM   . Pharmacist Clinical Goal(s): o Over the next 90 days, patient will work with PharmD and providers to maintain LDL goal < 70 . Current regimen:  . Aspirin 81 mg daily . Gemfibrozil 600mg  bid . DHA-EPA-Flaxseed oil- Vit E 1200 mg qd . Interventions:  Provided diet and exercise counseling. . Patient self care activities - Over the next 90 days, patient will: o Continue eating a healty diet, increased vegetables and limited trans and saturated fats and sodium. o Increase exercise as tolerated  Diabetes Lab Results  Component Value Date/Time   HGBA1C 6.5 10/02/2019 10:41 AM   HGBA1C 8.0 (H) 07/10/2019 09:07 AM   HGBA1C 6.8 11/06/2017 12:00 AM   . Pharmacist Clinical  Goal(s): o Over the next 90 days, patient will work with PharmD and providers to maintain A1c goal <7% . Current regimen:  . Metformin 1000 mg twice daily(takes 2 500mg  ) . Gabapentin 300 mg three times daily . Interventions: Provided diet and exercise counseling. . Patient self care activities - Over the next 90 days, patient will: o Check blood sugar in the morning before eating or drinking, document, and provide at future appointments o Contact provider with any episodes of hypoglycemia  . Medication management . Pharmacist Clinical Goal(s): o Over the next 90 days, patient will work with PharmD and providers to maintain optimal medication adherence . Current pharmacy: [facility] . Interventions o Comprehensive medication review performed. o Continue current medication management strategy . Patient self care activities - Over the next 90 days, patient will: o Focus on medication adherence by continued use of pill box. o Take medications as prescribed o Report any questions or concerns to PharmD and/or provider(s)  Initial goal documentation        The patient verbalized understanding of instructions provided today and agreed to receive a mailed copy of patient instruction and/or educational materials.  Telephone follow up appointment with pharmacy team member scheduled for: 07/16/19  [name]. Kenton Kingfisher PharmD, BCPS Clinical Pharmacist [phone]  Diabetic Neuropathy Diabetic neuropathy refers to nerve damage that is caused by diabetes (diabetes mellitus). Over time, people with diabetes can develop nerve damage throughout the body. There are several types of diabetic neuropathy:  Peripheral neuropathy.  This is the most common type of diabetic neuropathy. It causes damage to nerves that carry signals between the spinal cord and other parts of the body (peripheral nerves). This usually affects nerves in the feet and legs first, and may eventually affect the hands and  arms. The damage affects the ability to sense touch or temperature.  Autonomic neuropathy. This type causes damage to nerves that control involuntary functions (autonomic nerves). These nerves carry signals that control: ? Heartbeat. ? Body temperature. ? Blood pressure. ? Urination. ? Digestion. ? Sweating. ? Sexual function. ? Response to changing blood sugar (glucose) levels.  Focal neuropathy. This type of nerve damage affects one area of the body, such as an arm, a leg, or the face. The injury may involve one nerve or a small group of nerves. Focal neuropathy can be painful and unpredictable, and occurs most often in older adults with diabetes. This often develops suddenly, but usually improves over time and does not cause long-term problems.  Proximal neuropathy. This type of nerve damage affects the nerves of the thighs, hips, buttocks, or legs. It causes severe pain, weakness, and muscle death (atrophy), usually in the thigh muscles. It is more common among older men and people who have type 2 diabetes. The length of recovery time may vary. What are the causes? Peripheral, autonomic, and focal neuropathies are caused by diabetes that is not well controlled with treatment. The cause of proximal neuropathy is not known, but it may be caused by inflammation related to uncontrolled blood glucose levels. What are the signs or symptoms? Peripheral neuropathy Peripheral neuropathy develops slowly over time. When the nerves of the feet and legs no longer work, you may experience:  Burning, stabbing, or aching pain in the legs or feet.  Pain or cramping in the legs or feet.  Loss of feeling (numbness) and inability to feel pressure or pain in the feet. This can lead to: ? Thick calluses or sores on areas of constant pressure. ? Ulcers. ? Reduced ability to feel temperature changes.  Foot deformities.  Muscle weakness.  Loss of balance or coordination. Autonomic neuropathy The  symptoms of autonomic neuropathy vary depending on which nerves are affected. Symptoms may include:  Problems with digestion, such as: ? Nausea or vomiting. ? Poor appetite. ? Bloating. ? Diarrhea or constipation. ? Trouble swallowing. ? Losing weight without trying to.  Problems with the heart, blood and lungs, such as: ? Dizziness, especially when standing up. ? Fainting. ? Shortness of breath. ? Irregular heartbeat.  Bladder problems, such as: ? Trouble starting or stopping urination. ? Leaking urine. ? Trouble emptying the bladder. ? Urinary tract infections (UTIs).  Problems with other body functions, such as: ? Sweat. You may sweat too much or too little. ? Temperature. You might get hot easily. Or, you might feel cold more than usual. ? Sexual function. Men may not be able to get or maintain an erection. Women may have vaginal dryness and difficulty with arousal. Focal neuropathy Symptoms affect only one area of the body. Common symptoms include:  Numbness.  Tingling.  Burning pain.  Prickling feeling.  Very sensitive skin.  Weakness.  Inability to move (paralysis).  Muscle twitching.  Muscles getting smaller (wasting).  Poor coordination.  Double or blurred vision. Proximal neuropathy  Sudden, severe pain in the hip, thigh, or buttocks. Pain may spread from the back into the legs (sciatica).  Pain and numbness in the arms and legs.  Tingling.  Loss of bladder  control or bowel control.  Weakness and wasting of thigh muscles.  Difficulty getting up from a seated position.  Abdominal swelling.  Unexplained weight loss. How is this diagnosed? Diagnosis usually involves reviewing your medical history and any symptoms you have. Diagnosis varies depending on the type of neuropathy your health care provider suspects. Peripheral neuropathy Your health care provider will check areas that are affected by your nervous system (neurologic exam), such as  your reflexes, how you move, and what you can feel. You may have other tests, such as:  Blood tests.  Removal and examination of fluid that surrounds the spinal cord (lumbar puncture).  CT scan.  MRI.  A test to check the nerves that control muscles (electromyogram, EMG).  Tests of how quickly messages pass through your nerves (nerve conduction velocity tests).  Removal of a small piece of nerve to be examined under a microscope (biopsy). Autonomic neuropathy You may have tests, such as:  Tests to measure your blood pressure and heart rate. This may include monitoring you while you are safely secured to an exam table that moves you from a lying position to an upright position (table tilt test).  Breathing tests to check your lungs.  Tests to check how food moves through the digestive system (gastric emptying tests).  Blood, sweat, or urine tests.  Ultrasound of your bladder.  Spinal fluid tests. Focal neuropathy This condition may be diagnosed with:  A neurologic exam.  CT scan.  MRI.  EMG.  Nerve conduction velocity tests. Proximal neuropathy There is no test to diagnose this type of neuropathy. You may have tests to rule out other possible causes of this type of neuropathy. Tests may include:  X-rays of your spine and lumbar region.  Lumbar puncture.  MRI. How is this treated? The goal of treatment is to keep nerve damage from getting worse. The most important part of treatment is keeping your blood glucose level and your A1C level within your target range by following your diabetes management plan. Over time, maintaining lower blood glucose levels helps lessen symptoms. In some cases, you may need prescription pain medicine. Follow these instructions at home:  Lifestyle   Do not use any products that contain nicotine or tobacco, such as cigarettes and e-cigarettes. If you need help quitting, ask your health care provider.  Be physically active every day.  Include strength training and balance exercises.  Follow a healthy meal plan.  Work with your health care provider to manage your blood pressure. General instructions  Follow your diabetes management plan as directed. ? Check your blood glucose levels as directed by your health care provider. ? Keep your blood glucose in your target range as directed by your health care provider. ? Have your A1C level checked at least two times a year, or as often as told by your health care provider.  Take over the counter and prescription medicines only as told by your health care provider. This includes insulin and diabetes medicine.  Do not drive or use heavy machinery while taking prescription pain medicines.  Check your skin and feet every day for cuts, bruises, redness, blisters, or sores.  Keep all follow up visits as told by your health care provider. This is important. Contact a health care provider if:  You have burning, stabbing, or aching pain in your legs or feet.  You are unable to feel pressure or pain in your feet.  You develop problems with digestion, such as: ? Nausea. ? Vomiting. ?  Bloating. ? Constipation. ? Diarrhea. ? Abdominal pain.  You have difficulty with urination, such as inability: ? To control when you urinate (incontinence). ? To completely empty the bladder (retention).  You have palpitations.  You feel dizzy, weak, or faint when you stand up. Get help right away if:  You cannot urinate.  You have sudden weakness or loss of coordination.  You have trouble speaking.  You have pain or pressure in your chest.  You have an irregular heart beat.  You have sudden inability to move a part of your body. Summary  Diabetic neuropathy refers to nerve damage that is caused by diabetes. It can affect nerves throughout the entire body, causing numbness and pain in the arms, legs, digestive tract, heart, and other body systems.  Keep your blood glucose level  and your blood pressure in your target range, as directed by your health care provider. This can help prevent neuropathy from getting worse.  Check your skin and feet every day for cuts, bruises, redness, blisters, or sores.  Do not use any products that contain nicotine or tobacco, such as cigarettes and e-cigarettes. If you need help quitting, ask your health care provider. This information is not intended to replace advice given to you by your health care provider. Make sure you discuss any questions you have with your health care provider. Document Revised: 07/17/2017 Document Reviewed: 07/09/2016 Elsevier Patient Education  2020 [employer].

## 2020-04-21 ENCOUNTER — Other Ambulatory Visit: Payer: Self-pay | Admitting: Nurse Practitioner

## 2020-04-27 ENCOUNTER — Other Ambulatory Visit: Payer: Self-pay | Admitting: Nurse Practitioner

## 2020-04-27 MED ORDER — GABAPENTIN 300 MG PO CAPS: ORAL_CAPSULE | ORAL | 2 refills | Status: DC

## 2020-05-25 DIAGNOSIS — H02883 Meibomian gland dysfunction of right eye, unspecified eyelid: Secondary | ICD-10-CM | POA: Diagnosis not present

## 2020-05-25 DIAGNOSIS — E119 Type 2 diabetes mellitus without complications: Secondary | ICD-10-CM | POA: Diagnosis not present

## 2020-05-25 DIAGNOSIS — H26492 Other secondary cataract, left eye: Secondary | ICD-10-CM | POA: Diagnosis not present

## 2020-05-25 DIAGNOSIS — H04129 Dry eye syndrome of unspecified lacrimal gland: Secondary | ICD-10-CM | POA: Diagnosis not present

## 2020-05-25 DIAGNOSIS — H02886 Meibomian gland dysfunction of left eye, unspecified eyelid: Secondary | ICD-10-CM | POA: Diagnosis not present

## 2020-05-25 DIAGNOSIS — H40013 Open angle with borderline findings, low risk, bilateral: Secondary | ICD-10-CM | POA: Diagnosis not present

## 2020-05-25 DIAGNOSIS — H353131 Nonexudative age-related macular degeneration, bilateral, early dry stage: Secondary | ICD-10-CM | POA: Diagnosis not present

## 2020-05-25 LAB — HM DIABETES EYE EXAM

## 2020-07-12 ENCOUNTER — Ambulatory Visit (INDEPENDENT_AMBULATORY_CARE_PROVIDER_SITE_OTHER): Payer: Medicare Other | Admitting: Nurse Practitioner

## 2020-07-12 ENCOUNTER — Other Ambulatory Visit: Payer: Self-pay

## 2020-07-12 ENCOUNTER — Encounter: Payer: Self-pay | Admitting: Nurse Practitioner

## 2020-07-12 VITALS — BP 130/80 | HR 88 | Temp 98.7°F | Ht 65.2 in | Wt 218.8 lb

## 2020-07-12 DIAGNOSIS — G4733 Obstructive sleep apnea (adult) (pediatric): Secondary | ICD-10-CM | POA: Diagnosis not present

## 2020-07-12 DIAGNOSIS — M2559 Pain in other specified joint: Secondary | ICD-10-CM

## 2020-07-12 DIAGNOSIS — E538 Deficiency of other specified B group vitamins: Secondary | ICD-10-CM

## 2020-07-12 DIAGNOSIS — E1159 Type 2 diabetes mellitus with other circulatory complications: Secondary | ICD-10-CM | POA: Diagnosis not present

## 2020-07-12 DIAGNOSIS — E1140 Type 2 diabetes mellitus with diabetic neuropathy, unspecified: Secondary | ICD-10-CM | POA: Diagnosis not present

## 2020-07-12 DIAGNOSIS — Z6839 Body mass index (BMI) 39.0-39.9, adult: Secondary | ICD-10-CM

## 2020-07-12 DIAGNOSIS — I152 Hypertension secondary to endocrine disorders: Secondary | ICD-10-CM

## 2020-07-12 DIAGNOSIS — M2550 Pain in unspecified joint: Secondary | ICD-10-CM | POA: Insufficient documentation

## 2020-07-12 DIAGNOSIS — E559 Vitamin D deficiency, unspecified: Secondary | ICD-10-CM | POA: Diagnosis not present

## 2020-07-12 DIAGNOSIS — E785 Hyperlipidemia, unspecified: Secondary | ICD-10-CM

## 2020-07-12 DIAGNOSIS — E1169 Type 2 diabetes mellitus with other specified complication: Secondary | ICD-10-CM | POA: Diagnosis not present

## 2020-07-12 DIAGNOSIS — M85851 Other specified disorders of bone density and structure, right thigh: Secondary | ICD-10-CM

## 2020-07-12 LAB — MICROALBUMIN, URINE WAIVED
Creatinine, Urine Waived: 50 mg/dL (ref 10–300)
Microalb, Ur Waived: 10 mg/L (ref 0–19)
Microalb/Creat Ratio: <30 mg/g (ref <30)

## 2020-07-12 LAB — BAYER DCA HB A1C WAIVED: HB A1C (BAYER DCA - WAIVED): 6.7 % (ref <7.0)

## 2020-07-12 NOTE — Assessment & Plan Note (Signed)
Ongoing.  Continue daily supplement and recheck level next visit.  She is on chronic Metformin, which can cause lower B12 levels.  Educated her on this.  May consider monthly injections for a bit if level not improved with oral supplement. 

## 2020-07-12 NOTE — Assessment & Plan Note (Signed)
Chronic, stable.  Continue current medication regimen and adjust as needed.  Lipid panel today.  April LDL <60.

## 2020-07-12 NOTE — Patient Instructions (Signed)
Diabetes Mellitus and Nutrition, Adult When you have diabetes, or diabetes mellitus, it is very important to have healthy eating habits because your blood sugar (glucose) levels are greatly affected by what you eat and drink. Eating healthy foods in the right amounts, at about the same times every day, can help you:  Control your blood glucose.  Lower your risk of heart disease.  Improve your blood pressure.  Reach or maintain a healthy weight. What can affect my meal plan? Every person with diabetes is different, and each person has different needs for a meal plan. Your health care provider may recommend that you work with a dietitian to make a meal plan that is best for you. Your meal plan may vary depending on factors such as:  The calories you need.  The medicines you take.  Your weight.  Your blood glucose, blood pressure, and cholesterol levels.  Your activity level.  Other health conditions you have, such as heart or kidney disease. How do carbohydrates affect me? Carbohydrates, also called carbs, affect your blood glucose level more than any other type of food. Eating carbs naturally raises the amount of glucose in your blood. Carb counting is a method for keeping track of how many carbs you eat. Counting carbs is important to keep your blood glucose at a healthy level, especially if you use insulin or take certain oral diabetes medicines. It is important to know how many carbs you can safely have in each meal. This is different for every person. Your dietitian can help you calculate how many carbs you should have at each meal and for each snack. How does alcohol affect me? Alcohol can cause a sudden decrease in blood glucose (hypoglycemia), especially if you use insulin or take certain oral diabetes medicines. Hypoglycemia can be a life-threatening condition. Symptoms of hypoglycemia, such as sleepiness, dizziness, and confusion, are similar to symptoms of having too much  alcohol.  Do not drink alcohol if: ? Your health care provider tells you not to drink. ? You are pregnant, may be pregnant, or are planning to become pregnant.  If you drink alcohol: ? Do not drink on an empty stomach. ? Limit how much you use to:  0-1 drink a day for women.  0-2 drinks a day for men. ? Be aware of how much alcohol is in your drink. In the [place] one drink equals one 12 oz bottle of beer (355 mL), one 5 oz glass of wine (148 mL), or one 1 oz glass of hard liquor (44 mL). ? Keep yourself hydrated with water, diet soda, or unsweetened iced tea.  Keep in mind that regular soda, juice, and other mixers may contain a lot of sugar and must be counted as carbs. What are tips for following this plan? Reading food labels  Start by checking the serving size on the "Nutrition Facts" label of packaged foods and drinks. The amount of calories, carbs, fats, and other nutrients listed on the label is based on one serving of the item. Many items contain more than one serving per package.  Check the total grams (g) of carbs in one serving. You can calculate the number of servings of carbs in one serving by dividing the total carbs by 15. For example, if a food has 30 g of total carbs per serving, it would be equal to 2 servings of carbs.  Check the number of grams (g) of saturated fats and trans fats in one serving. Choose foods that have   a low amount or none of these fats.  Check the number of milligrams (mg) of salt (sodium) in one serving. Most people should limit total sodium intake to less than 2,300 mg per day.  Always check the nutrition information of foods labeled as "low-fat" or "nonfat." These foods may be higher in added sugar or refined carbs and should be avoided.  Talk to your dietitian to identify your daily goals for nutrients listed on the label. Shopping  Avoid buying canned, pre-made, or processed foods. These foods tend to be high in fat, sodium, and added  sugar.  Shop around the outside edge of the grocery store. This is where you will most often find fresh fruits and vegetables, bulk grains, fresh meats, and fresh dairy. Cooking  Use low-heat cooking methods, such as baking, instead of high-heat cooking methods like deep frying.  Cook using healthy oils, such as olive, canola, or sunflower oil.  Avoid cooking with butter, cream, or high-fat meats. Meal planning  Eat meals and snacks regularly, preferably at the same times every day. Avoid going long periods of time without eating.  Eat foods that are high in fiber, such as fresh fruits, vegetables, beans, and whole grains. Talk with your dietitian about how many servings of carbs you can eat at each meal.  Eat 4-6 oz (112-168 g) of lean protein each day, such as lean meat, chicken, fish, eggs, or tofu. One ounce (oz) of lean protein is equal to: ? 1 oz (28 g) of meat, chicken, or fish. ? 1 egg. ?  cup (62 g) of tofu.  Eat some foods each day that contain healthy fats, such as avocado, nuts, seeds, and fish.   What foods should I eat? Fruits Berries. Apples. Oranges. Peaches. Apricots. Plums. Grapes. Mango. Papaya. Pomegranate. Kiwi. Cherries. Vegetables Lettuce. Spinach. Leafy greens, including kale, chard, collard greens, and mustard greens. Beets. Cauliflower. Cabbage. Broccoli. Carrots. Green beans. Tomatoes. Peppers. Onions. Cucumbers. Brussels sprouts. Grains Whole grains, such as whole-wheat or whole-grain bread, crackers, tortillas, cereal, and pasta. Unsweetened oatmeal. Quinoa. Brown or wild rice. Meats and other proteins Seafood. Poultry without skin. Lean cuts of poultry and beef. Tofu. Nuts. Seeds. Dairy Low-fat or fat-free dairy products such as milk, yogurt, and cheese. The items listed above may not be a complete list of foods and beverages you can eat. Contact a dietitian for more information. What foods should I avoid? Fruits Fruits canned with  syrup. Vegetables Canned vegetables. Frozen vegetables with butter or cream sauce. Grains Refined white flour and flour products such as bread, pasta, snack foods, and cereals. Avoid all processed foods. Meats and other proteins Fatty cuts of meat. Poultry with skin. Breaded or fried meats. Processed meat. Avoid saturated fats. Dairy Full-fat yogurt, cheese, or milk. Beverages Sweetened drinks, such as soda or iced tea. The items listed above may not be a complete list of foods and beverages you should avoid. Contact a dietitian for more information. Questions to ask a health care provider  Do I need to meet with a diabetes educator?  Do I need to meet with a dietitian?  What number can I call if I have questions?  When are the best times to check my blood glucose? Where to find more information:  American Diabetes Association: diabetes.org  Academy of Nutrition and Dietetics: www.eatright.org  National Institute of Diabetes and Digestive and Kidney Diseases: www.niddk.nih.gov  Association of Diabetes Care and Education Specialists: www.diabeteseducator.org Summary  It is important to have healthy eating   habits because your blood sugar (glucose) levels are greatly affected by what you eat and drink.  A healthy meal plan will help you control your blood glucose and maintain a healthy lifestyle.  Your health care provider may recommend that you work with a dietitian to make a meal plan that is best for you.  Keep in mind that carbohydrates (carbs) and alcohol have immediate effects on your blood glucose levels. It is important to count carbs and to use alcohol carefully. This information is not intended to replace advice given to you by your health care provider. Make sure you discuss any questions you have with your health care provider. Document Revised: 05/12/2019 Document Reviewed: 05/12/2019 Elsevier Patient Education  2021 Elsevier Inc.  

## 2020-07-12 NOTE — Assessment & Plan Note (Signed)
Chronic, to knees bilateral and left hip.  Recommend continued use of Tylenol as needed + OTC Voltaren gel.  May alternate ice and heat as needed.  Consider PT and imaging in future if worsening.

## 2020-07-12 NOTE — Assessment & Plan Note (Signed)
Chronic, ongoing.  A1C 6.7% today, staying stable, praised for this and urine ALB 10.  Continue Metformin 1000 MG BID and adjust regimen as needed.  Focus on diabetic diet at home and regular exercise.  Continue Gabapentin daily for neuropathy, continue this.  Will recheck B12 level today, continue supplement.  Return in 3 months.

## 2020-07-12 NOTE — Assessment & Plan Note (Signed)
Chronic, ongoing.  Noted on DEXA.  Continue Vitamin D supplement and check level today.  DEXA in 2026 next. 

## 2020-07-12 NOTE — Progress Notes (Signed)
BP 130/80   Pulse 88 Comment: apical  Temp 98.7 F (37.1 C) (Oral)   Ht 5' 5.2" (1.656 m)   Wt 218 lb 12.8 oz (99.2 kg)   LMP  (LMP Unknown)   SpO2 96%   BMI 36.19 kg/m    Subjective:    [name], female    DOB: 09-28-1945, 75 y.o.   MRN: [id number]  HPI: Catherine Hinton is a 75 y.o. female  Chief Complaint  Patient presents with  . Diabetes   DIABETES Last A1C was October 6.5%.  Continues on Metformin 500 MG twice daily. Takes Gabapentin 300 MG TID for neuropathy -- helps but discomfort is worse at night.  Last visit B12 level was 290, some improvement -- continues taking B12 daily. 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 [name]. Hypoglycemic episodes:no Polydipsia/polyuria: no Visual disturbance: no Chest pain: no Paresthesias: no Glucose Monitoring: yes  Accucheck frequency: 6 times a week  Fasting glucose: 132 this morning, yesterday 120 -- had one 170 level  Post prandial:  Evening:  Before meals: Taking Insulin?: no  Long acting insulin:  Short acting insulin: Blood Pressure Monitoring: daily Retinal Examination: Up to Date Foot Exam: Up to Date Pneumovax: Up to Date Influenza: Up to Date Aspirin: yes   HYPERTENSION / HYPERLIPIDEMIA Continues on Metoprolol and Valsartan-HCTZ, ASA and Lopid.  Has OSA and uses CPAP 100%. Satisfied with current treatment? yes Duration of hypertension: chronic BP monitoring frequency: daily BP range: 120/70 range BP medication side effects: no Duration of hyperlipidemia: chronic Cholesterol medication side effects: no Cholesterol supplements: none Medication compliance: good compliance Aspirin: yes Recent stressors: no Recurrent headaches: no Visual changes: no Palpitations: no Dyspnea: no Chest pain: no Lower extremity edema: baseline, no worse Dizzy/lightheaded: no   OSTEOPENIA Had DEXA 02/03/20 -- The BMD measured at Femur Neck Right is 0.843 g/cm2 with a T-score of -1.4. Past osteoporosis  medications/treatments:  Adequate calcium & vitamin D: yes Weight bearing exercises: yes   ARTHRALGIAS Ongoing for months.  Intermittent to left side and discomfort to bilateral knees at times. Duration: months Involved joints: left hip and bilateral knees Mechanism of injury: unknown Location: posterior Onset: gradual  Severity: mild  Quality: aching Frequency: intermittent Radiation: no Aggravating factors: when first lies down hip starts hurting, knees hurt with walking Alleviating factors: Tylenol, TENS Status: stable Treatments attempted: Tylenol and occasional Ibuprofen Relief with NSAIDs?: moderate Weakness with weight bearing: occasional Weakness with walking: occasional Paresthesias / decreased sensation: no Swelling: occasional Redness:no Fevers: no  Relevant past medical, surgical, family and social history reviewed and updated as indicated. Interim medical history since our last visit reviewed. Allergies and medications reviewed and updated.  Review of Systems  Constitutional: Negative for activity change, appetite change, diaphoresis, fatigue and fever.  Respiratory: Negative for cough, chest tightness, shortness of breath and wheezing.   Cardiovascular: Negative for chest pain, palpitations and leg swelling.  Gastrointestinal: Negative.   Endocrine: Negative for cold intolerance, heat intolerance, polydipsia, polyphagia and polyuria.  Genitourinary: Negative.   Neurological: Negative.   Psychiatric/Behavioral: Negative.     Per HPI unless specifically indicated above     Objective:    BP 130/80   Pulse 88 Comment: apical  Temp 98.7 F (37.1 C) (Oral)   Ht 5' 5.2" (1.656 m)   Wt 218 lb 12.8 oz (99.2 kg)   LMP  (LMP Unknown)   SpO2 96%   BMI 36.19 kg/m   Wt Readings from Last 3 Encounters:  07/12/20 218 lb 12.8 oz (99.2 kg)  04/08/20 222 lb (100.7 kg)  02/01/20 212 lb (96.2 kg)    Physical Exam Vitals and nursing note reviewed.   Constitutional:      General: She is awake. She is not in acute distress.    Appearance: She is well-developed. She is obese. She is not ill-appearing.  HENT:     Head: Normocephalic.     Right Ear: Hearing, tympanic membrane and ear canal normal. No drainage.     Left Ear: Hearing, tympanic membrane, ear canal and external ear normal. No drainage.     Nose: Nose normal.     Mouth/Throat:     Mouth: Mucous membranes are moist.     Pharynx: Oropharynx is clear.  Eyes:     General: Lids are normal.        Right eye: No discharge.        Left eye: No discharge.     Conjunctiva/sclera: Conjunctivae normal.     Pupils: Pupils are equal, round, and reactive to light.  Neck:     Vascular: No carotid bruit.  Cardiovascular:     Rate and Rhythm: Normal rate and regular rhythm.     Heart sounds: Normal heart sounds. No murmur heard. No gallop.   Pulmonary:     Effort: Pulmonary effort is normal. No accessory muscle usage or respiratory distress.     Breath sounds: Normal breath sounds.  Abdominal:     General: Bowel sounds are normal.     Palpations: Abdomen is soft. There is no hepatomegaly or splenomegaly.     Tenderness: There is no abdominal tenderness. There is no right CVA tenderness or left CVA tenderness.  Musculoskeletal:     Cervical back: Normal range of motion and neck supple.     Right hip: Normal.     Left hip: Tenderness present.     Right knee: Crepitus present. No swelling or erythema. Normal range of motion. No tenderness.     Left knee: Crepitus present. No swelling or erythema. Normal range of motion. No tenderness.     Right lower leg: No edema.     Left lower leg: No edema.     Comments: Normal ROM left hip, mild tenderness posterior hip.  Lymphadenopathy:     Cervical: No cervical adenopathy.  Skin:    General: Skin is warm and dry.  Neurological:     Mental Status: She is alert and oriented to person, place, and time.  Psychiatric:        Attention and  Perception: Attention normal.        Mood and Affect: Mood normal.        Behavior: Behavior normal. Behavior is cooperative.        Thought Content: Thought content normal.        Judgment: Judgment normal.     Results for orders placed or performed in visit on 05/25/20  HM DIABETES EYE EXAM  Result Value Ref Range   HM Diabetic Eye Exam No Retinopathy No Retinopathy      Assessment & Plan:   Problem List Items Addressed This Visit      Cardiovascular and Mediastinum   Hypertension associated with diabetes ([place])    Chronic, stable with BP below goal on home readings and at goal in office. Continue current medication regimen and adjust as needed based on BP readings + focus on DASH diet.  BMP and TSH today. Could consider discontinuation of HCTZ  at upcoming visit, as BP well controlled and history of gout.  Return in 3 months.      Relevant Orders   Bayer DCA Hb A1c Waived   Basic metabolic panel   Microalbumin, Urine Waived   TSH     Respiratory   Obstructive sleep apnea    Continue nightly use, praised for this.        Endocrine   Hyperlipidemia associated with type 2 diabetes mellitus (HCC)    Chronic, stable.  Continue current medication regimen and adjust as needed.  Lipid panel today.  April LDL <60.      Relevant Orders   Bayer DCA Hb A1c Waived   Lipid Panel w/o Chol/HDL Ratio   Type 2 diabetes mellitus with diabetic neuropathy, unspecified (HCC) - Primary    Chronic, ongoing.  A1C 6.7% today, staying stable, praised for this and urine ALB 10.  Continue Metformin 1000 MG BID and adjust regimen as needed.  Focus on diabetic diet at home and regular exercise.  Continue Gabapentin daily for neuropathy, continue this.  Will recheck B12 level today, continue supplement.  Return in 3 months.      Relevant Orders   Bayer DCA Hb A1c Waived     Musculoskeletal and Integument   Osteopenia    Chronic, ongoing.  Noted on DEXA.  Continue Vitamin D supplement and check  level today.  DEXA in 2026 next.      Relevant Orders   VITAMIN D 25 Hydroxy (Vit-D Deficiency, Fractures)     Other   Morbid obesity (HCC)    BMI 36.19.  Recommended eating smaller high protein, low fat meals more frequently and exercising 30 mins a day 5 times a week with a goal of 10-15lb weight loss in the next 3 months. Patient voiced their understanding and motivation to adhere to these recommendations.       Vitamin D deficiency    Chronic, stable.  Continue daily supplement and check Vit D level today.  DEXA in 2026 next      BMI 39.0-39.9,adult    With T2DM.  Recommend heavy focus on healthy diet and regular exercise regimen.      B12 deficiency    Ongoing.  Continue daily supplement and recheck level next visit.  She is on chronic Metformin, which can cause lower B12 levels.  Educated her on this.  May consider monthly injections for a bit if level not improved with oral supplement.      Relevant Orders   Vitamin B12   Arthralgia    Chronic, to knees bilateral and left hip.  Recommend continued use of Tylenol as needed + OTC Voltaren gel.  May alternate ice and heat as needed.  Consider PT and imaging in future if worsening.          Follow up plan: Return in about 3 months (around 10/10/2020) for T2DM, HTN/HLD.

## 2020-07-12 NOTE — Assessment & Plan Note (Signed)
Chronic, stable.  Continue daily supplement and check Vit D level today.  DEXA in 2026 next. 

## 2020-07-12 NOTE — Assessment & Plan Note (Signed)
BMI 36.19.  Recommended eating smaller high protein, low fat meals more frequently and exercising 30 mins a day 5 times a week with a goal of 10-15lb weight loss in the next 3 months. Patient voiced their understanding and motivation to adhere to these recommendations.

## 2020-07-12 NOTE — Assessment & Plan Note (Signed)
Continue nightly use, praised for this.

## 2020-07-12 NOTE — Assessment & Plan Note (Signed)
Chronic, stable with BP below goal on home readings and at goal in office. Continue current medication regimen and adjust as needed based on BP readings + focus on DASH diet.  BMP and TSH today. Could consider discontinuation of HCTZ at upcoming visit, as BP well controlled and history of gout.  Return in 3 months.

## 2020-07-12 NOTE — Assessment & Plan Note (Signed)
With T2DM.  Recommend heavy focus on healthy diet and regular exercise regimen. 

## 2020-07-13 LAB — BASIC METABOLIC PANEL
BUN/Creatinine Ratio: 23 (ref 12–28)
BUN: 14 mg/dL (ref 8–27)
CO2: 22 mmol/L (ref 20–29)
Calcium: 10 mg/dL (ref 8.7–10.3)
Chloride: 96 mmol/L (ref 96–106)
Creatinine, Ser: 0.61 mg/dL (ref 0.57–1.00)
GFR calc Af Amer: 105 mL/min/{1.73_m2} (ref >59)
GFR calc non Af Amer: 91 mL/min/{1.73_m2} (ref >59)
Glucose: 199 mg/dL — ABNORMAL HIGH (ref 65–99)
Potassium: 4 mmol/L (ref 3.5–5.2)
Sodium: 139 mmol/L (ref 134–144)

## 2020-07-13 LAB — TSH: TSH: 0.486 u[IU]/mL (ref 0.450–4.500)

## 2020-07-13 LAB — LIPID PANEL W/O CHOL/HDL RATIO
Cholesterol, Total: 144 mg/dL (ref 100–199)
HDL: 35 mg/dL — ABNORMAL LOW (ref >39)
LDL Chol Calc (NIH): 72 mg/dL (ref 0–99)
Triglycerides: 222 mg/dL — ABNORMAL HIGH (ref 0–149)
VLDL Cholesterol Cal: 37 mg/dL (ref 5–40)

## 2020-07-13 LAB — VITAMIN B12: Vitamin B-12: 198 pg/mL — ABNORMAL LOW (ref 232–1245)

## 2020-07-13 LAB — VITAMIN D 25 HYDROXY (VIT D DEFICIENCY, FRACTURES): Vit D, 25-Hydroxy: 41.5 ng/mL (ref 30.0–100.0)

## 2020-07-13 NOTE — Progress Notes (Signed)
Contacted via [place] -- please check to make sure she obtained MyChart message, call her as not sure she checks: Good evening [place], your labs have returned.  Kidney function remains stable.  LDL is at goal, this is the bad cholesterol number, but triglycerides remain a little elevated.  I do recommend adding in a little fish oil daily and eating less high saturated fat meals.  Thyroid level normal.  B12 remains on low side, it is 198, would like to see greater then 300 as this is very beneficial for memory and overall nervous system health.  I would recommend taking Vitamin B12 1000 MCG daily which you can obtain in vitamin section at many locations.  We will then recheck next visit.  Any questions? Keep being amazing!!  Thank you for allowing me to participate in your care. Kindest regards, Dalynn Jhaveri

## 2020-07-15 ENCOUNTER — Ambulatory Visit: Payer: Medicare Other | Admitting: Pharmacist

## 2020-07-15 DIAGNOSIS — I152 Hypertension secondary to endocrine disorders: Secondary | ICD-10-CM

## 2020-07-15 DIAGNOSIS — E1169 Type 2 diabetes mellitus with other specified complication: Secondary | ICD-10-CM

## 2020-07-15 DIAGNOSIS — E1159 Type 2 diabetes mellitus with other circulatory complications: Secondary | ICD-10-CM

## 2020-07-15 DIAGNOSIS — E785 Hyperlipidemia, unspecified: Secondary | ICD-10-CM

## 2020-07-15 DIAGNOSIS — E1140 Type 2 diabetes mellitus with diabetic neuropathy, unspecified: Secondary | ICD-10-CM

## 2020-07-15 NOTE — Patient Instructions (Addendum)
Visit Information  It was a pleasure speaking with you today. Thank you for letting me be part of your clinical team. Please call with any questions or concerns.   Goals Addressed            This Visit's Progress   . Chronic Care Management -PharmD       CARE PLAN ENTRY (see longitudinal plan of care for additional care plan information)  Current Barriers:  . Chronic Disease Management support, education, and care coordination needs related to Hypertension, Hyperlipidemia, Diabetes, Gout, and Vitamin D and B12 Deficiency   Hypertension BP Readings from Last 3 Encounters:  07/12/20 130/80  04/08/20 135/75  02/01/20 119/75   . Pharmacist Clinical Goal(s): o Over the next 90 days, patient will work with PharmD and providers to maintain BP goal <130/80 . Current regimen:  Alphonse Guild 160-12.5mg  qd . Metoprolol succinate 25 mg qd . Interventions: o reviewed Proper BP technique o Provided diet and exercise counseling. . Patient self care activities - Over the next 90 days, patient will: o Check BP 1-2 times document, and provide at future appointments o Ensure daily salt intake < 2300 mg/day  Hyperlipidemia Lab Results  Component Value Date/Time   LDLCALC 72 07/12/2020 09:47 AM   . Pharmacist Clinical Goal(s): o Over the next 90 days, patient will work with PharmD and providers to achieve LDL goal < 70 . Current regimen:  . Aspirin 81 mg daily . Gemfibrozil 600mg  bid . DHA-EPA-Flaxseed oil- Vit E 1200 mg qd . Interventions:  Provided diet and exercise counseling. Reviewed signs of bleeding . Patient self care activities - Over the next 90 days, patient will: o Focus on eating a healty diet, increased vegetables and limited trans and saturated fats and sodium. o Increase exercise as tolerated  Diabetes Lab Results  Component Value Date/Time   HGBA1C 6.7 07/12/2020 09:45 AM   HGBA1C 6.5 04/08/2020 09:32 AM   HGBA1C 6.8 11/06/2017 12:00 AM   . Pharmacist  Clinical Goal(s): o Over the next 90 days, patient will work with PharmD and providers to maintain A1c goal <7% . Current regimen:  . Metformin 1000 mg twice daily(takes 2 500mg  ) . Gabapentin 300 mg three times daily . Interventions: Provided diet and exercise counseling. Praised patient for maintaining goal A1c . Patient self care activities - Over the next 90 days, patient will: o Check blood sugar in the morning before eating or drinking, document, and provide at future appointments o Contact provider with any episodes of hypoglycemia  . Medication management . Pharmacist Clinical Goal(s): o Over the next 90 days, patient will work with PharmD and providers to maintain optimal medication adherence . Current pharmacy: [facility] mail order . Interventions o Comprehensive medication review performed. o Continue current medication management strategy . Patient self care activities - Over the next 90 days, patient will: o Focus on medication adherence by continued use of pill box. o Take medications as prescribed o Report any questions or concerns to PharmD and/or provider(s)  Please see past updates related to this goal by clicking on the "Past Updates" button in the selected goal         The patient verbalized understanding of instructions, educational materials, and care plan provided today and agreed to receive a mailed copy of patient instructions, educational materials, and care plan.   Telephone follow up appointment with pharmacy team member scheduled for: 3 months  [name]. Kenton Kingfisher PharmD, BCPS Clinical Pharmacist [phone]  Preventing Yoga Injuries  Yoga is a very popular mind-body exercise. People practice yoga for fitness, relaxation, and emotional health. Yoga may also be helpful for relieving the pain of some muscle and joint disorders, such as tennis elbow, low back pain, and arthritis. Yoga poses involve moving and stretching your muscles and joints. Yoga can  increase strength, flexibility, and balance. Injuries from yoga are rare if the poses are done correctly. Injuries can occur from overstretching or stretching incorrectly. There are several steps you can take to lower your risk of a yoga injury. How can these injuries affect me? Injuries from yoga can include strained muscles and injuries to the tissues that connect muscles to bones (tendon strains) and the tissues that connect bones to other bones (ligament sprains). A back injury is the most common type of yoga injury. Other injuries can occur in the shoulders, wrists, and knees. What actions can I take to prevent yoga injuries? Take safety measures before you begin  Check with your health care provider before you begin yoga. Make sure yoga is safe for you. Tell your health care provider about all your medical conditions and any previous injuries.  Learn about the different types of yoga before trying a class. Some types are more vigorous than others.  Find a Forensic psychologist. Tell the instructor about any physical limitations you have so that he or she can adjust your yoga program to your ability. Follow guidelines for safe exercise  Wear loose, comfortable clothing to allow for easy movement.  Warm up and stretch before every yoga session. Cold muscles are more easily injured. Warm up with some light jogging or exercise for 3-5 minutes. Follow your warm-up with some gentle stretching. After your yoga session, repeat the stretching during your cool-down period.  Start slowly and avoid any advanced postures when you are first starting out. Advanced postures include headstand, shoulder stand, and lotus postures.  Listen to your body. Stop if you have any pain.  Drink water before and during exercise, even if you are not thirsty. This is especially important if you are doing hot yoga.  Do not return to doing yoga after an injury until you have been cleared by your health care  provider. Let your yoga instructor know about your injury. Do not try to exercise through the pain.  Maintain your basic fitness level along with your yoga program. Stay in shape by exercising for at least 30 minutes at least 5 days of the week.   Where to find more information  American Academy of Orthopaedic Surgeons: orthoinfo.aaos.[employer] of Health: www.niams.SouthExposed.es Contact a health care provider if you have:  Pain during or after yoga.  Pain when at rest.  Swelling or bruising.  Stiffness or limited movement. Summary  Yoga is a type of mind-body exercise that can improve your strength, flexibility, and balance.  Yoga poses involve moving and stretching your muscles and joints. Injuries are rare but can happen from overstretching or stretching incorrectly.  Work with a Forensic psychologist. Tell your instructor about any physical limitations you have prior to beginning yoga.  Listen to your body. Stop if you feel pain in any yoga position. Do not try to exercise through the pain.  Do not return to doing yoga after an injury until you have been cleared by your health care provider. Let your yoga instructor know about your injury. This information is not intended to replace advice given to you by your health care provider. Make sure you  discuss any questions you have with your health care provider. Document Revised: 02/06/2018 Document Reviewed: 02/06/2018 Elsevier Patient Education  2021 [employer].

## 2020-07-15 NOTE — Chronic Care Management (AMB) (Signed)
Chronic Care Management Pharmacy  Name: Catherine Hinton  MRN: [id number] DOB: 06-25-1945   Chief Complaint/ HPI  [place],  75 y.o. , female presents for their Follow-Up CCM visit with the clinical pharmacist via telephone.  PCP : [name], NP Patient Care Team: [name], NP as PCP - General (Nurse Practitioner) Anell Barr, OD (Optometry) Vladimir Faster, [facility] (Pharmacist)  Their chronic conditions include: Hypertension, Hyperlipidemia, Diabetes, Osteopenia, Gout and Vitamin D/B12 Deficiencies   Office Visits: 10/22-21- Marnee Guarneri, NP- blood work, gabapentin increased to 300 qam 600 qpm, return in 3 mos, consider d/d hctz at next visit    Allergies  Allergen Reactions  . Niacin Itching  . Sulfa Antibiotics Other (See Comments)    Fever    Medications: Outpatient Encounter Medications as of 07/15/2020  Medication Sig Note  . allopurinol (ZYLOPRIM) 300 MG tablet Take 1 tablet (300 mg total) by mouth daily.   [name] aspirin 81 MG EC tablet Take 81 mg by mouth daily.    . B Complex-C-Folic Acid (SUPER B COMPLEX/FA/VIT C PO) Take 1 tablet by mouth daily.   . Cholecalciferol 25 MCG (1000 UT) tablet Take 2,000 Units by mouth daily.  12/01/2014: Received from: [place]: Take by mouth.  . DHA-EPA-Flaxseed Oil-Vitamin E (THERA TEARS NUTRITION PO) Take 1 mg by mouth 2 (two) times daily.    [name] gabapentin (NEURONTIN) 300 MG capsule TAKE 1 CAPSULE IN THE MORNING AND TWO CAPSULES AT NIGHT BEFORE BED TIME.   [name] gemfibrozil (LOPID) 600 MG tablet TAKE 1 TABLET TWICE DAILY   . latanoprost (XALATAN) 0.005 % ophthalmic solution 1 drop at bedtime.   . metFORMIN (GLUCOPHAGE) 500 MG tablet TAKE 2 TABLETS TWICE DAILY   . metoprolol succinate (TOPROL-XL) 25 MG 24 hr tablet Take 1 tablet (25 mg total) by mouth daily.   . Multiple Vitamins-Minerals (EYE VITAMINS PO) Take 1 tablet by mouth daily.   . valsartan-hydrochlorothiazide  (DIOVAN-HCT) 160-12.5 MG tablet Take 1 tablet by mouth daily.   [name] XIIDRA 5 % SOLN Place 1 drop into both eyes 2 (two) times daily.  04/25/2015: Received from: External Pharmacy   No facility-administered encounter medications on file as of 07/15/2020.    Wt Readings from Last 3 Encounters:  07/12/20 218 lb 12.8 oz (99.2 kg)  04/08/20 222 lb (100.7 kg)  02/01/20 212 lb (96.2 kg)    Current Diagnosis/Assessment:    Goals Addressed   None     Diabetes with Neuropathy   A1c goal <7%  Recent Relevant Labs: Lab Results  Component Value Date/Time   HGBA1C 6.7 07/12/2020 09:45 AM   HGBA1C 6.5 04/08/2020 09:32 AM   HGBA1C 6.8 11/06/2017 12:00 AM   MICROALBUR 10 07/12/2020 09:45 AM   MICROALBUR 10 07/10/2019 09:07 AM    Last diabetic Eye exam:  Lab Results  Component Value Date/Time   HMDIABEYEEXA No Retinopathy 05/25/2020 12:00 AM    Last diabetic Foot exam:  Lab Results  Component Value Date/Time   HMDIABFOOTEX PP 11/02/2014 12:00 AM    BMP Latest Ref Rng & Units 07/12/2020 04/08/2020 01/01/2020  Glucose 65 - 99 mg/dL 199(H) 246(H) 152(H)  BUN 8 - 27 mg/dL '14 10 10  ' Creatinine 0.57 - 1.00 mg/dL 0.61 0.59 0.59  BUN/Creat Ratio 12 - '28 23 17 17  ' Sodium 134 - 144 mmol/L 139 140 139  Potassium 3.5 - 5.2 mmol/L 4.0 4.0 4.2  Chloride 96 - 106 mmol/L 96  99 99  CO2 20 - 29 mmol/L '22 24 24  ' Calcium 8.7 - 10.3 mg/dL 10.0 9.7 9.6   EGFr~ 47m/min Checking BG: Daily  Recent FBG Readings: 130-170 this week, 154 this am  Patient has failed these meds in past: NA Patient is currently controlled on the following medications: .Marland KitchenMetformin 1000 mg bid . Gabapentin 300 qam 6043mqpm  We discussed: Diet and exercise. Patient states she is ready to start eating healthier. She had cereal and popcorn for dinner last night. Reviewed goal glucose readings for an A1c of <7%, we want to see fasting sugars <130 and 2 hour after meal sugars <180.    Plan:  Continue Current  medications.  Hyperlipidemia   LDL goal < 70  Lipid Panel     Component Value Date/Time   CHOL 144 07/12/2020 0947   CHOL 225 (H) 12/09/2018 0820   TRIG 222 (H) 07/12/2020 0947   TRIG 115 12/09/2018 0820   HDL 35 (L) 07/12/2020 0947   LDLCALC 72 07/12/2020 0947    Hepatic Function Latest Ref Rng & Units 10/02/2019 09/18/2019 03/24/2019  Total Protein 6.0 - 8.5 g/dL 7.1 7.1 6.7  Albumin 3.7 - 4.7 g/dL 4.7 4.8(H) 4.5  AST 0 - 40 IU/L '16 19 21  ' ALT 0 - 32 IU/L '20 17 17  ' Alk Phosphatase 39 - 117 IU/L 46 48 49  Total Bilirubin 0.0 - 1.2 mg/dL 0.4 0.4 0.5     The 10-year ASCVD risk score (GMikey BussingC Jr., et al., 2013) is: 28%   Values used to calculate the score:     Age: [age]     Sex: Female     Is Non-Hispanic African American: No     Diabetic: Yes     Tobacco smoker: No     Systolic Blood Pressure: 13373mHg     Is BP treated: Yes     HDL Cholesterol: 35 mg/dL     Total Cholesterol: 144 mg/dL   Patient has failed these meds in past: NA Patient is currently controlled on the following medications:  . Gemfibrozil 600 mg bid . Aspirin 81 mg qd . DHA-EPA Flaxseed oil qd  We discussed:  diet and exercise extensively. Patient states she has been mostly sedentary due to cold weather and osteoarthritis. She is afraid of falling as she lives alone. She continues to due some stretching and we discussed options of water therapy and chair yoga as well as PT for strengthening muscles around the knees. Reviewed symptoms of bleeding.  Plan  Continue current medications  .  Hypertension   BP goal is:  <130/80  Office blood pressures are  BP Readings from Last 3 Encounters:  07/12/20 130/80  04/08/20 135/75  02/01/20 119/75   Patient checks BP at home 3-5x per week Patient home BP readings are ranging: 120s-130s/60-70s  Patient has failed these meds in the past:  Patient is currently controlled on the following medications:  . Marland Kitchenetoprolol Succinate 25 mg qd . Valsartan HCTZ  160-12.5 mg qd  We discussed Patient was prescribed Toprol XL years ago by a cardiologist for tachycardia (HR.120 at rest) due to "small heart size". Reports her HR is normally 80-90s at rest. She limits salt and caffeine in diet.    Plan  Continue current medications    . Osteopenia / Osteoporosis/ vitamin D deficiniency   Last DEXA Scan: 02/03/20  T-Score femoral neck: -1.4  T-Score lumbar spine: -0.4  10-year probability of major osteoporotic  fracture: 14.4%  10-year probability of hip fracture: 1.9%  Vit D, 25-Hydroxy  Date Value Ref Range Status  07/12/2020 41.5 30.0 - 100.0 ng/mL Final    Comment:    Vitamin D deficiency has been defined by the [place] practice guideline as a level of serum 25-OH vitamin D less than 20 ng/mL (1,2). The Endocrine Society went on to further define vitamin D insufficiency as a level between 21 and 29 ng/mL (2). 1. IOM (Institute of Medicine). 2010. Dietary reference    intakes for calcium and D. [place]: The    [employer]. 2. Holick MF, Binkley [place], Bischoff-Ferrari HA, et al.    Evaluation, treatment, and prevention of vitamin D    deficiency: an Endocrine Society clinical practice    guideline. JCEM. 2011 Jul; 96(7):1911-30.      Patient is not a candidate for pharmacologic treatment  Patient has failed these meds in past: Patient is currently uncontrolled on the following medications:  [name] Vitamin D 2000u qd    We discussed:  Recommend [phone] units of vitamin D daily. Recommend 1200 mg of calcium daily from dietary and supplemental sources. Recommend weight-bearing and muscle strengthening exercises for building and maintaining bone density. Discussed dietary sources of calcium and recommended intake  Plan  Continue current medications. Recommend consider calcium supplementation for fracture prevention.  Vitamin B12 deficiency   Patient has failed these meds in past:  NA Patient is currently controlled on the following medications:  [name] Vitamin b12 1000 mcg daily  We discussed:  Follow up b12 level was low at 198. She has changed from B Complex to b12 1000 mcg daily and is concerned about her level.  Plan  Continue current medications. Recheck levels at next visit     Osteoarthritis   Patient has failed these meds in past: NA Patient is currently uncontrolled on the following medications:  . Acetaminophen prn . Diclofenac gel   We discussed:  Patient hasn't picked up Voltaren gel. We discussed PT and water therapy as well chair yoga and healthy knees classes. Patient wants to wait for COVID to die down before going into a group setting regularly. She is still doing stretching exercises at home.  Plan  Continue current medications   Vaccines   Reviewed and discussed patient's vaccination history.    Immunization History  Administered Date(s) Administered  . Fluad Quad(high Dose 65+) 03/24/2019, 04/08/2020  . Influenza, High Dose Seasonal PF 04/25/2016, 04/04/2017, 02/18/2018  . Influenza,inj,Quad PF,6+ Mos 04/25/2015  . Influenza-Unspecified 04/18/2012  . PFIZER(Purple Top)SARS-COV-2 Vaccination 07/29/2019, 08/19/2019, 03/20/2020  . Pneumococcal Conjugate-13 04/20/2014  . Pneumococcal Polysaccharide-23 10/26/2015  . Pneumococcal-Unspecified 06/18/2006  . Tdap 12/12/2010  . Zoster 09/21/2013    Plan  Recommended patient receive Shingrix.  Medication Management   Pt uses [place] [facility] for all medications Uses pill box? Yes Pt endorses 95 % compliance  We discussed: Current pharmacy is preferred with insurance plan and patient is satisfied with pharmacy services  Plan  Continue current medication management strategy    Follow up: 3  month phone visit  [name]. Kenton Kingfisher PharmD, [facility] [phone]

## 2020-07-16 IMAGING — US ULTRASOUND RIGHT BREAST LIMITED
1 series · 7 of 7 positions shown · non-contrast
Comparison: Previous exam(s).
COMPARISON: Previous exam(s).

Addendum:
CLINICAL DATA: 72-year-old female presenting for annual bilateral
mammogram in 2 year follow-up of a probably benign left breast mass.

EXAM:
DIGITAL DIAGNOSTIC BILATERAL MAMMOGRAM WITH CAD AND TOMO
ULTRASOUND LEFT BREAST

[Series 1: ultrasound right breast limited · 0.06mm/px · 7 of 7 slices shown]
[im 1/7]
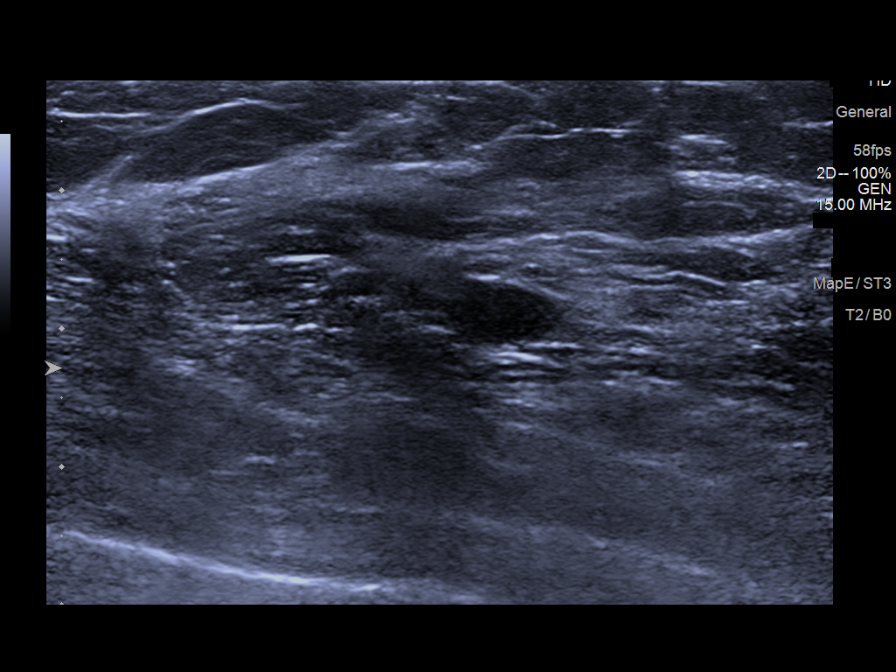
[im 2/7]
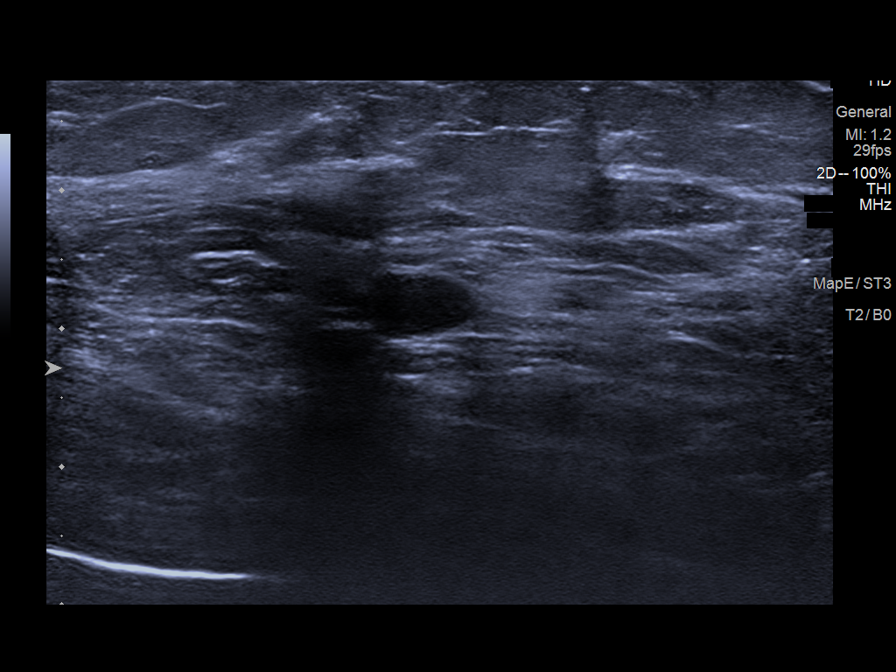
[im 3/7]
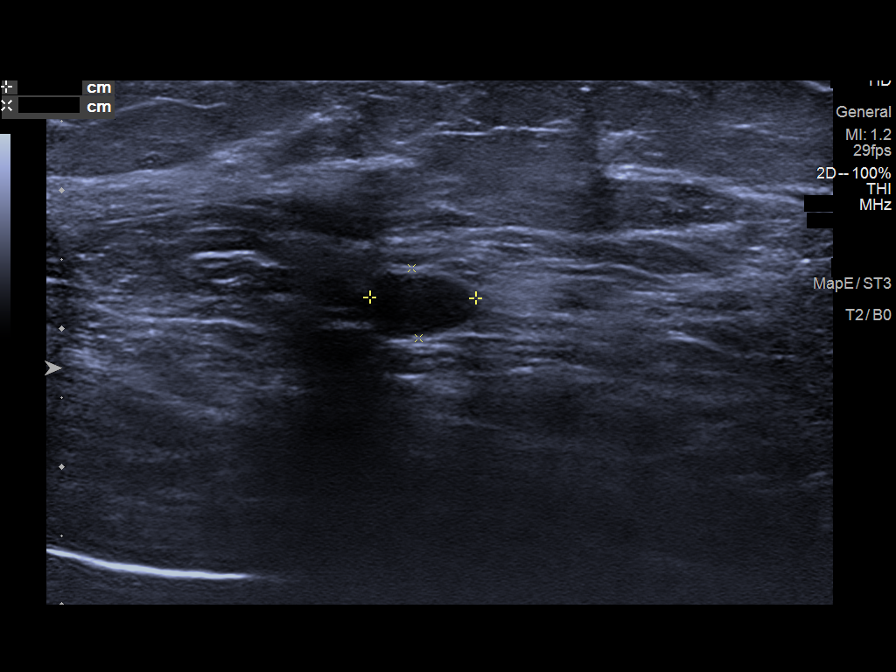
[im 4/7]
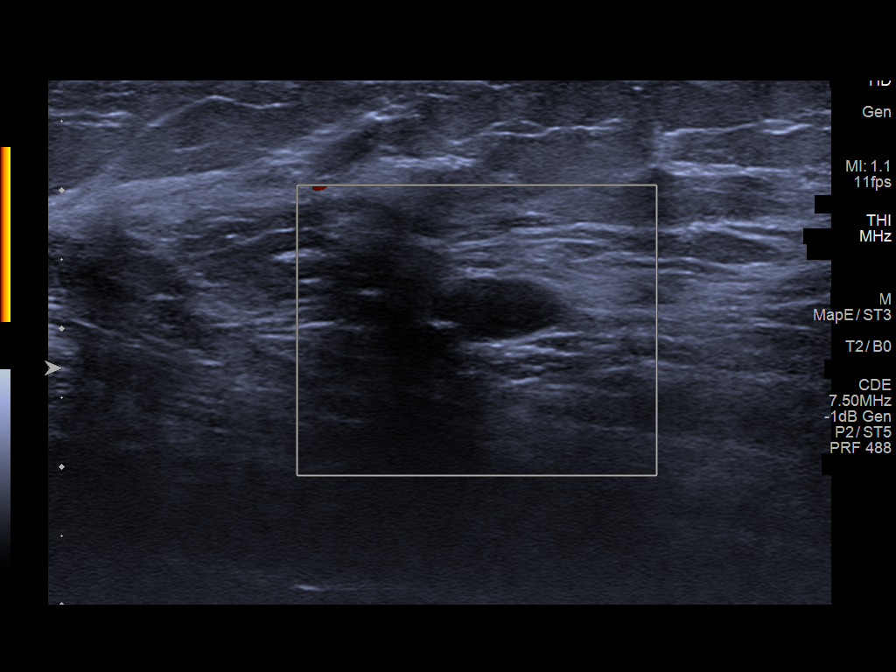
[im 5/7]
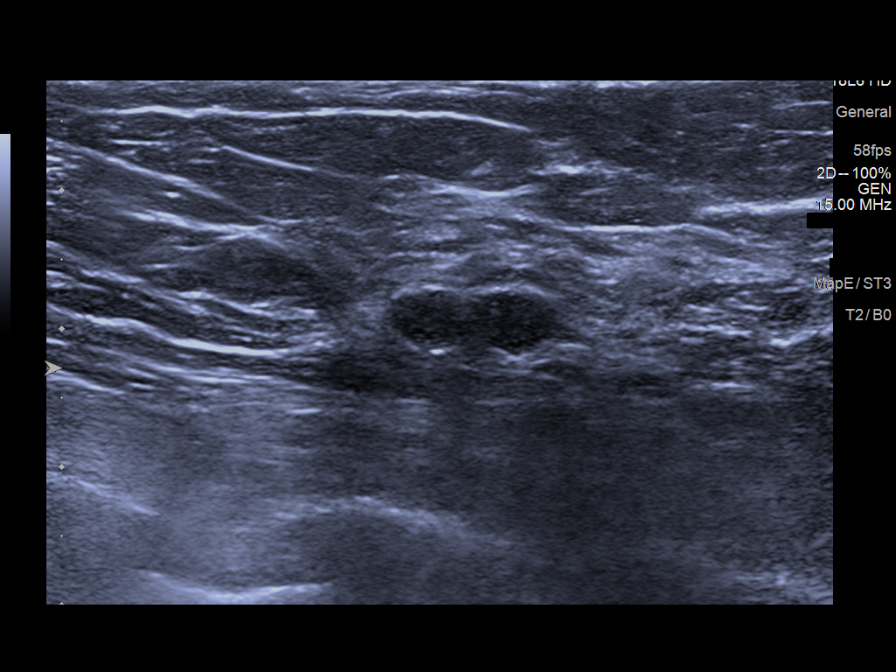
[im 6/7]
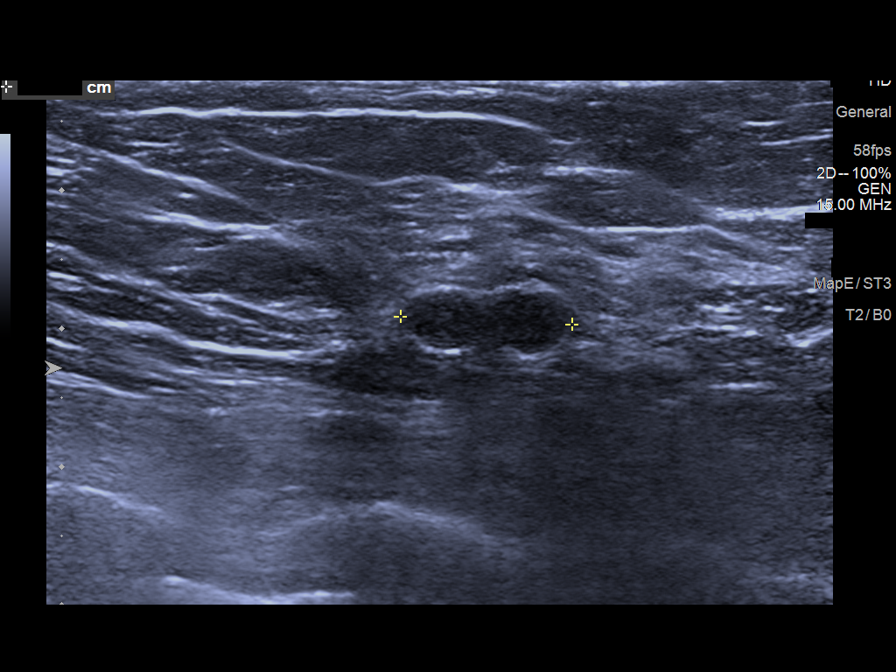
[im 7/7]
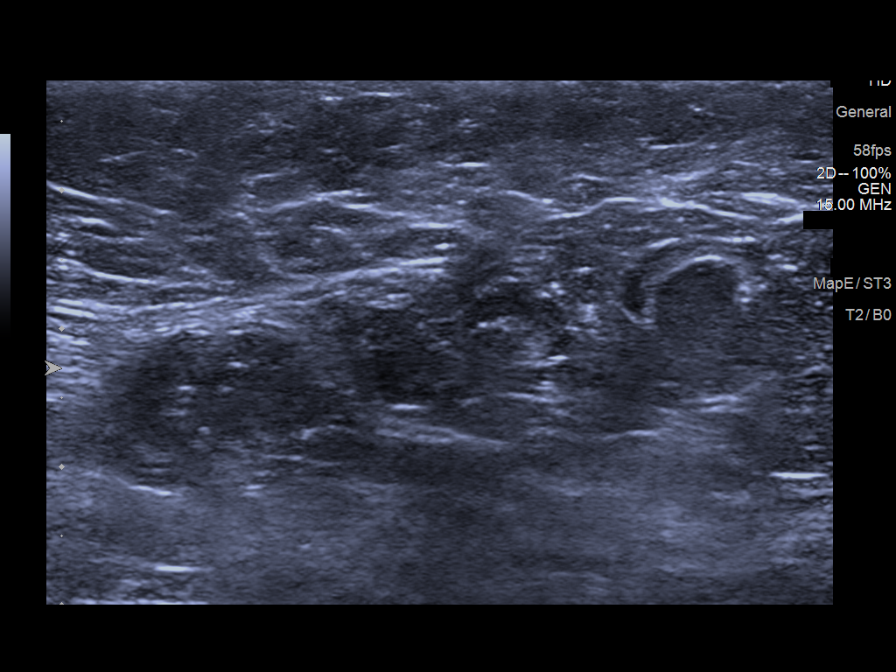

[7 of 7 positions shown; findings below may reference images not displayed]

ACR Breast Density Category c: The breast tissue is heterogeneously
dense, which may obscure small masses.
FINDINGS: A partially circumscribed partially obscured mass in the far
posterior upper outer right breast now demonstrates a bilobed
appearance. No new or additional suspicious findings are identified
in either breast.

Mammographic images were processed with CAD.

Targeted ultrasound is performed, showing an oval, circumscribed
hypoechoic mass now demonstrating an additional second lobe at the
10 o'clock position 7 cm from the nipple. Overall measurements are
1.2 x 0.8 x 0.5 cm (previously 0.7 x 0.7 x 0.5 cm). Evaluation of
the right axilla demonstrates no suspicious lymphadenopathy.
IMPRESSION: 1. Indeterminate right breast mass now demonstrating a bilobed
appearance at the 10 o'clock position 7 cm from the nipple.
Recommendation is for ultrasound-guided aspiration to ensure
benignity.
2. No suspicious right axillary lymphadenopathy.
3. No mammographic evidence of malignancy on the left.

RECOMMENDATION:
Ultrasound-guided aspiration of a bilobed mass within the right
breast. If this mass does not aspirate to completion, conversion to
biopsy is recommended.

I have discussed the findings and recommendations with the patient.
Results were also provided in writing at the conclusion of the
visit. If applicable, a reminder letter will be sent to the patient
regarding the next appointment.

BI-RADS CATEGORY  4: Suspicious.

ADDENDUM:
Patient underwent ultrasound-guided cyst aspiration on 11/20/2018.
The bilobed cyst completely collapsed with aspiration confirming a
benign etiology.

New BI-RADS category, 2: Benign.

New recommendation: Screening mammogram in one year.(Code:[id number]-9AP)

*** End of Addendum ***
ACR Breast Density Category c: The breast tissue is heterogeneously
dense, which may obscure small masses.
FINDINGS: A partially circumscribed partially obscured mass in the far
posterior upper outer right breast now demonstrates a bilobed
appearance. No new or additional suspicious findings are identified
in either breast.

Mammographic images were processed with CAD.

Targeted ultrasound is performed, showing an oval, circumscribed
hypoechoic mass now demonstrating an additional second lobe at the
10 o'clock position 7 cm from the nipple. Overall measurements are
1.2 x 0.8 x 0.5 cm (previously 0.7 x 0.7 x 0.5 cm). Evaluation of
the right axilla demonstrates no suspicious lymphadenopathy.
IMPRESSION: 1. Indeterminate right breast mass now demonstrating a bilobed
appearance at the 10 o'clock position 7 cm from the nipple.
Recommendation is for ultrasound-guided aspiration to ensure
benignity.
2. No suspicious right axillary lymphadenopathy.
3. No mammographic evidence of malignancy on the left.

RECOMMENDATION:
Ultrasound-guided aspiration of a bilobed mass within the right
breast. If this mass does not aspirate to completion, conversion to
biopsy is recommended.

I have discussed the findings and recommendations with the patient.
Results were also provided in writing at the conclusion of the
visit. If applicable, a reminder letter will be sent to the patient
regarding the next appointment.

BI-RADS CATEGORY  4: Suspicious.

## 2020-07-16 IMAGING — MG DIGITAL DIAGNOSTIC BILATERAL MAMMOGRAM WITH TOMO AND CAD
5 of 10 series · 5 of 30 positions shown · non-contrast
Comparison: Previous exam(s).
COMPARISON: Previous exam(s).

Addendum:
CLINICAL DATA: 72-year-old female presenting for annual bilateral
mammogram in 2 year follow-up of a probably benign left breast mass.

EXAM:
DIGITAL DIAGNOSTIC BILATERAL MAMMOGRAM WITH CAD AND TOMO
ULTRASOUND LEFT BREAST

[L CC synth-2D]
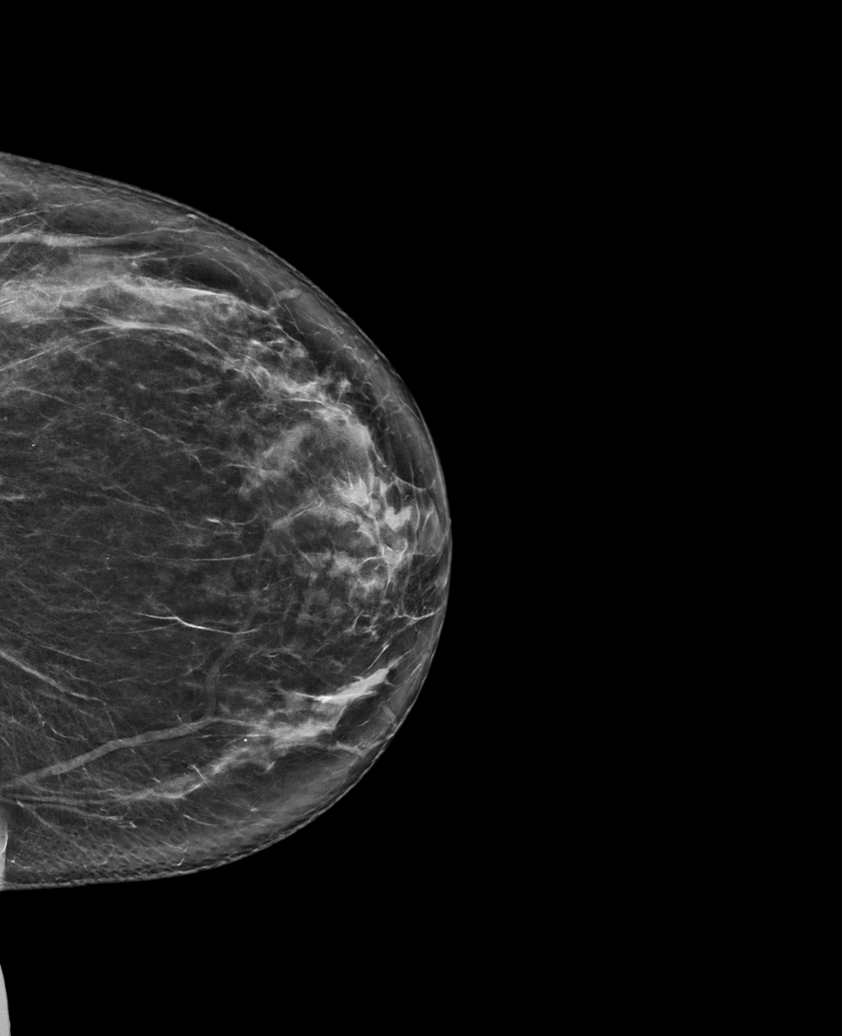

[R MLO synth-2D]
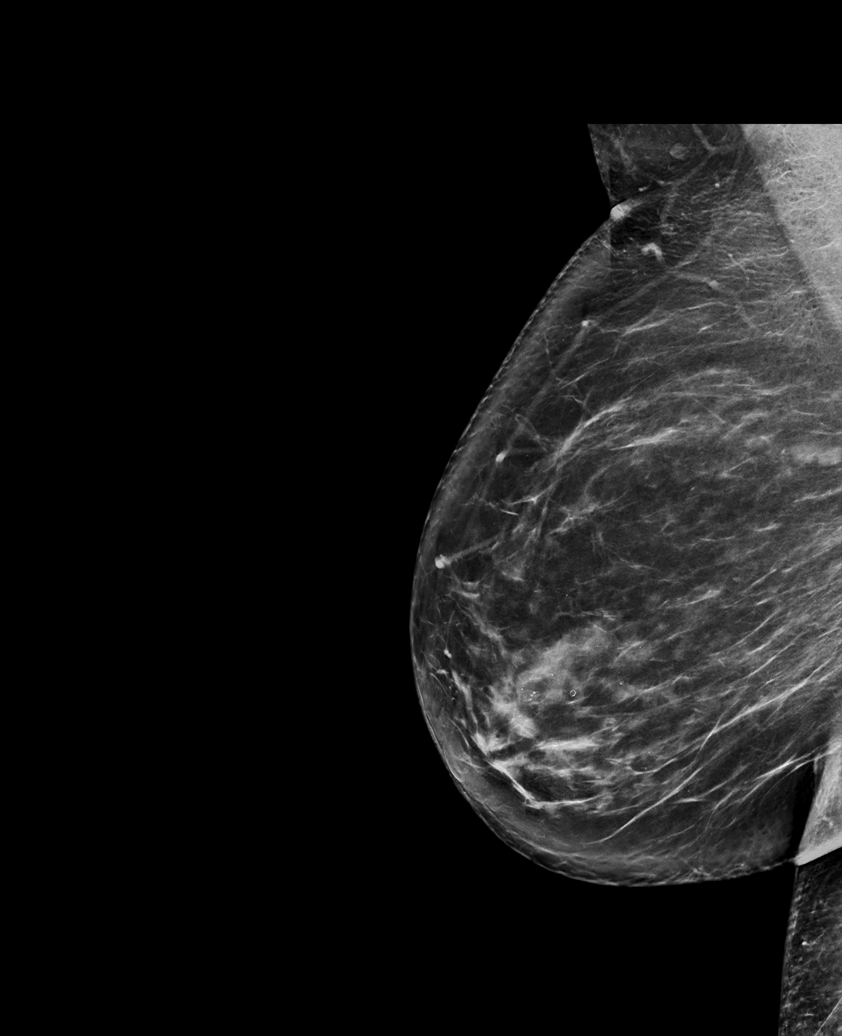

[L MLO synth-2D (1 of 2)]
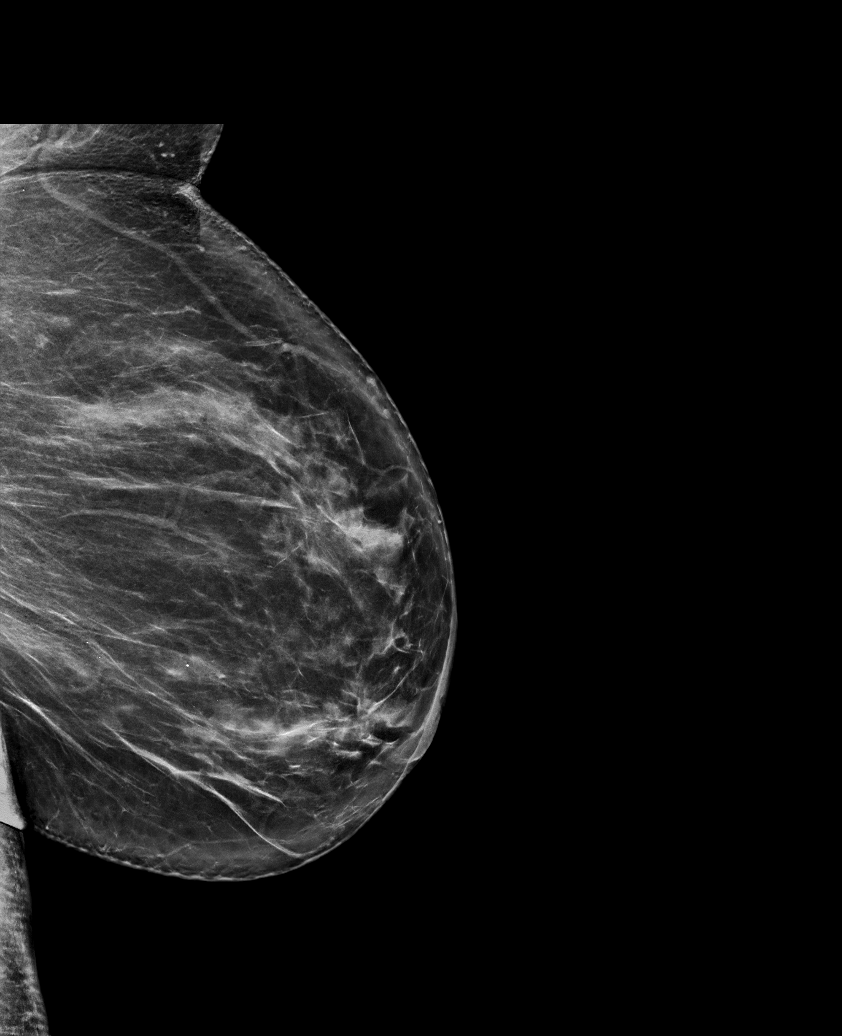

[R CC synth-2D]
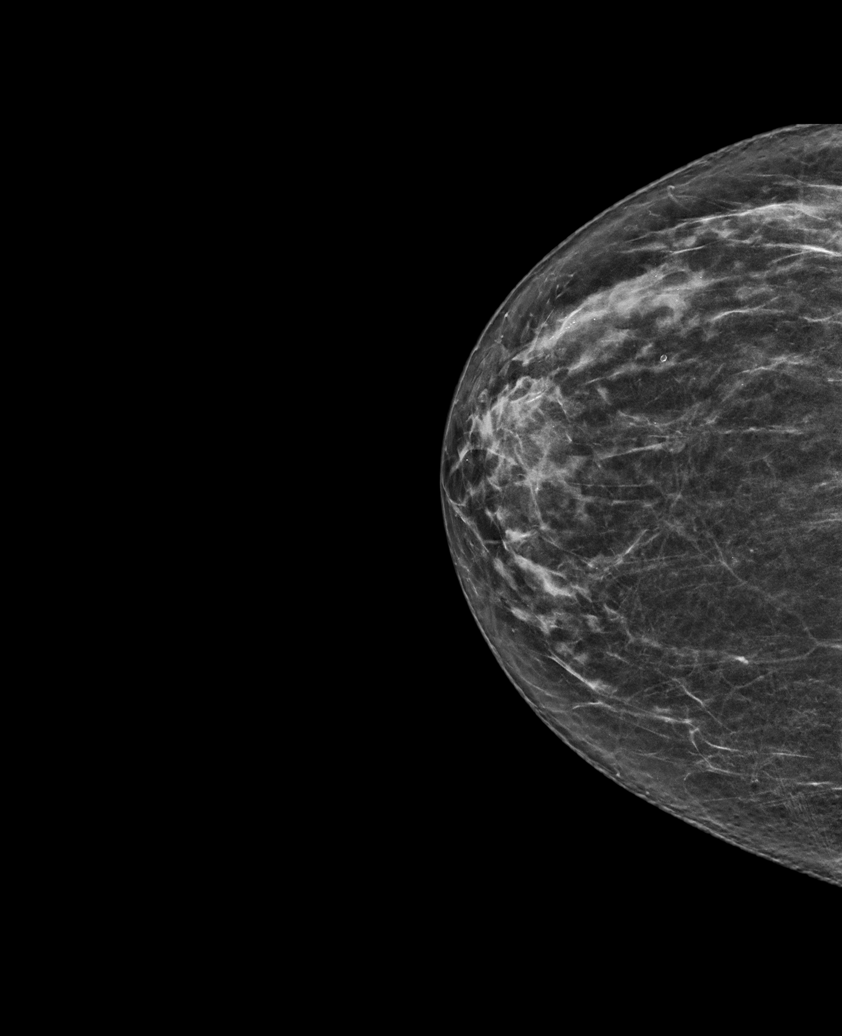

[L MLO synth-2D (2 of 2)]
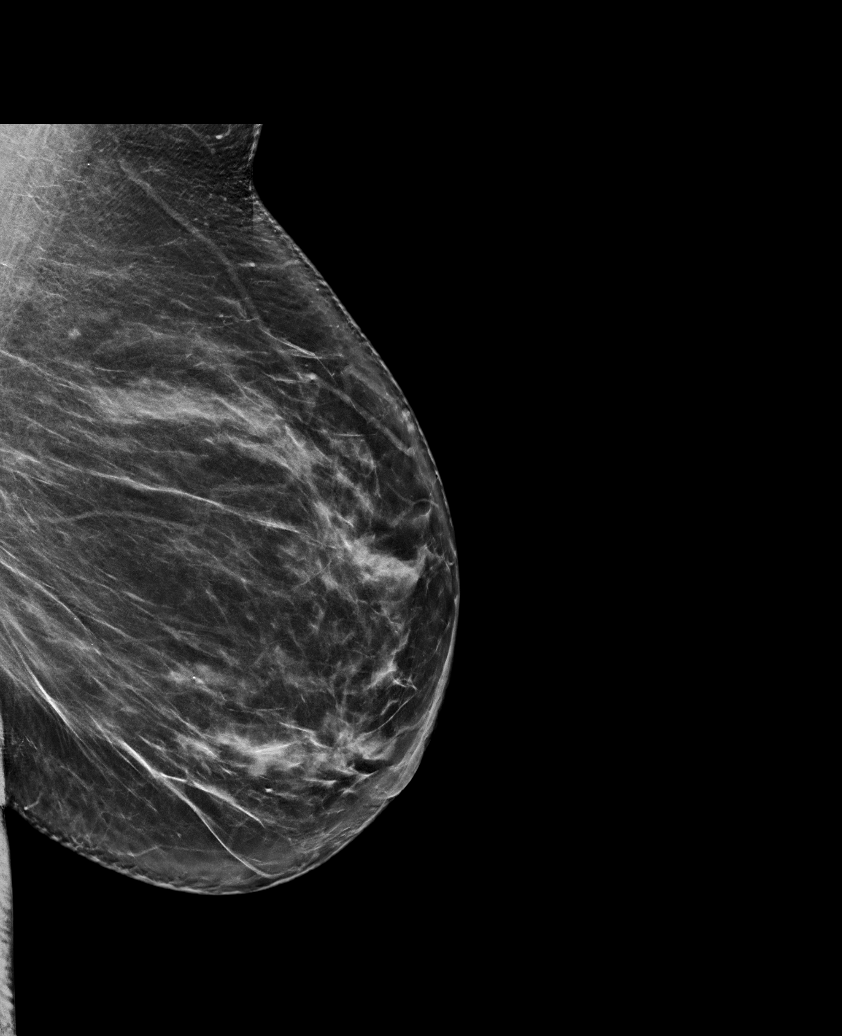

[5 of 30 positions shown; findings below may reference images not displayed]

ACR Breast Density Category c: The breast tissue is heterogeneously
dense, which may obscure small masses.
FINDINGS: A partially circumscribed partially obscured mass in the far
posterior upper outer right breast now demonstrates a bilobed
appearance. No new or additional suspicious findings are identified
in either breast.

Mammographic images were processed with CAD.

Targeted ultrasound is performed, showing an oval, circumscribed
hypoechoic mass now demonstrating an additional second lobe at the
10 o'clock position 7 cm from the nipple. Overall measurements are
1.2 x 0.8 x 0.5 cm (previously 0.7 x 0.7 x 0.5 cm). Evaluation of
the right axilla demonstrates no suspicious lymphadenopathy.
IMPRESSION: 1. Indeterminate right breast mass now demonstrating a bilobed
appearance at the 10 o'clock position 7 cm from the nipple.
Recommendation is for ultrasound-guided aspiration to ensure
benignity.
2. No suspicious right axillary lymphadenopathy.
3. No mammographic evidence of malignancy on the left.

RECOMMENDATION:
Ultrasound-guided aspiration of a bilobed mass within the right
breast. If this mass does not aspirate to completion, conversion to
biopsy is recommended.

I have discussed the findings and recommendations with the patient.
Results were also provided in writing at the conclusion of the
visit. If applicable, a reminder letter will be sent to the patient
regarding the next appointment.

BI-RADS CATEGORY  4: Suspicious.

ADDENDUM:
Patient underwent ultrasound-guided cyst aspiration on 11/20/2018.
The bilobed cyst completely collapsed with aspiration confirming a
benign etiology.

New BI-RADS category, 2: Benign.

New recommendation: Screening mammogram in one year.(Code:U3-F-9AP)

*** End of Addendum ***
ACR Breast Density Category c: The breast tissue is heterogeneously
dense, which may obscure small masses.
FINDINGS: A partially circumscribed partially obscured mass in the far
posterior upper outer right breast now demonstrates a bilobed
appearance. No new or additional suspicious findings are identified
in either breast.

Mammographic images were processed with CAD.

Targeted ultrasound is performed, showing an oval, circumscribed
hypoechoic mass now demonstrating an additional second lobe at the
10 o'clock position 7 cm from the nipple. Overall measurements are
1.2 x 0.8 x 0.5 cm (previously 0.7 x 0.7 x 0.5 cm). Evaluation of
the right axilla demonstrates no suspicious lymphadenopathy.
IMPRESSION: 1. Indeterminate right breast mass now demonstrating a bilobed
appearance at the 10 o'clock position 7 cm from the nipple.
Recommendation is for ultrasound-guided aspiration to ensure
benignity.
2. No suspicious right axillary lymphadenopathy.
3. No mammographic evidence of malignancy on the left.

RECOMMENDATION:
Ultrasound-guided aspiration of a bilobed mass within the right
breast. If this mass does not aspirate to completion, conversion to
biopsy is recommended.

I have discussed the findings and recommendations with the patient.
Results were also provided in writing at the conclusion of the
visit. If applicable, a reminder letter will be sent to the patient
regarding the next appointment.

BI-RADS CATEGORY  4: Suspicious.

## 2020-07-21 ENCOUNTER — Other Ambulatory Visit: Payer: Self-pay | Admitting: Nurse Practitioner

## 2020-08-22 DIAGNOSIS — E119 Type 2 diabetes mellitus without complications: Secondary | ICD-10-CM | POA: Diagnosis not present

## 2020-08-22 DIAGNOSIS — H353130 Nonexudative age-related macular degeneration, bilateral, stage unspecified: Secondary | ICD-10-CM | POA: Diagnosis not present

## 2020-08-22 DIAGNOSIS — H40019 Open angle with borderline findings, low risk, unspecified eye: Secondary | ICD-10-CM | POA: Diagnosis not present

## 2020-08-22 DIAGNOSIS — H02886 Meibomian gland dysfunction of left eye, unspecified eyelid: Secondary | ICD-10-CM | POA: Diagnosis not present

## 2020-08-22 DIAGNOSIS — H04129 Dry eye syndrome of unspecified lacrimal gland: Secondary | ICD-10-CM | POA: Diagnosis not present

## 2020-08-22 DIAGNOSIS — H02883 Meibomian gland dysfunction of right eye, unspecified eyelid: Secondary | ICD-10-CM | POA: Diagnosis not present

## 2020-08-22 DIAGNOSIS — H26492 Other secondary cataract, left eye: Secondary | ICD-10-CM | POA: Diagnosis not present

## 2020-09-02 ENCOUNTER — Telehealth: Payer: Self-pay | Admitting: Pharmacist

## 2020-09-02 NOTE — Chronic Care Management (AMB) (Signed)
Chronic Care Management Pharmacy Assistant   Name: Catherine Hinton  MRN: [id number] DOB: [birth year]  Reason for Encounter: Disease State/Diabetes Adherence Call  Recent office visits:  n/a  Recent consult visits:  [name] visits:  None in previous 6 months  Medications: Outpatient Encounter Medications as of 09/02/2020  Medication Sig Note  . allopurinol (ZYLOPRIM) 300 MG tablet Take 1 tablet (300 mg total) by mouth daily.   [name] aspirin 81 MG EC tablet Take 81 mg by mouth daily.    . Cholecalciferol 25 MCG (1000 UT) tablet Take 2,000 Units by mouth daily.  12/01/2014: Received from: [facility]: Take by mouth.  . DHA-EPA-Flaxseed Oil-Vitamin E (THERA TEARS NUTRITION PO) Take 1 mg by mouth 2 (two) times daily.    [name] gabapentin (NEURONTIN) 300 MG capsule TAKE 1 CAPSULE EVERY MORNING AND TAKE 2 CAPSULES EVERY NIGHT BEFORE BEDTIME   . gemfibrozil (LOPID) 600 MG tablet TAKE 1 TABLET TWICE DAILY   . latanoprost (XALATAN) 0.005 % ophthalmic solution 1 drop at bedtime.   . metFORMIN (GLUCOPHAGE) 500 MG tablet TAKE 2 TABLETS TWICE DAILY   . metoprolol succinate (TOPROL-XL) 25 MG 24 hr tablet Take 1 tablet (25 mg total) by mouth daily.   . Multiple Vitamins-Minerals (EYE VITAMINS PO) Take 1 tablet by mouth daily.   . valsartan-hydrochlorothiazide (DIOVAN-HCT) 160-12.5 MG tablet Take 1 tablet by mouth daily.   . vitamin B-12 (CYANOCOBALAMIN) 1000 MCG tablet Take 1,000 mcg by mouth daily.   [name] XIIDRA 5 % SOLN Place 1 drop into both eyes 2 (two) times daily.  04/25/2015: Received from: External Pharmacy   No facility-administered encounter medications on file as of 09/02/2020.    Recent Relevant Labs: Lab Results  Component Value Date/Time   HGBA1C 6.7 07/12/2020 09:45 AM   HGBA1C 6.5 04/08/2020 09:32 AM   HGBA1C 6.8 11/06/2017 12:00 AM   MICROALBUR 10 07/12/2020 09:45 AM   MICROALBUR 10 07/10/2019 09:07 AM    Kidney Function Lab Results  Component  Value Date/Time   CREATININE 0.61 07/12/2020 09:47 AM   CREATININE 0.59 04/08/2020 09:33 AM   GFRNONAA 91 07/12/2020 09:47 AM   GFRAA 105 07/12/2020 09:47 AM    . Current antihyperglycemic regimen:  o Metformin 500 mg two tablets twice daily  . What recent interventions/DTPs have been made to improve glycemic control:  o None noted, patient states she is taking her medications as instructed.  . Have there been any recent hospitalizations or ED visits since last visit with CPP? No   . Patient denies hypoglycemic symptoms, including Pale, Sweaty, Shaky, Hungry, Nervous/irritable and Vision changes   . Patient denies hyperglycemic symptoms, including blurry vision, excessive thirst, fatigue, polyuria and weakness   . How often are you checking your blood sugar? once daily  .  [name] What are your blood sugars ranging?  o Fasting: 130's o Before meals: n/a o After meals: n/a o Bedtime: n/a  . During the week, how often does your blood glucose drop below 70? Never   . Are you checking your feet daily/regularly? Yes, patient checks her feet regularly.  Adherence Review: Is the patient currently on a STATIN medication? No Is the patient currently on ACE/ARB medication? Yes Does the patient have >5 day gap between last estimated fill dates? No   Star Rating Drugs: Valsartan-HCTZ 160-12.5 mg last filled 04/20/2020 90 DS Metformin 500 mg last filled 03/06/2020 90 DS  Future Appointments  Date Time Provider Department  Center  10/10/2020 10:00 AM Marnee Guarneri T, NP [facility]  10/12/2020  9:30 AM [facility]   Reminded patient of her follow up appointment on 10/12/2020 at 9:30 with Birdena Crandall, CPP.  April D Calhoun, [facility] Pharmacist Assistant [phone]

## 2020-10-10 ENCOUNTER — Other Ambulatory Visit: Payer: Self-pay

## 2020-10-10 ENCOUNTER — Encounter: Payer: Self-pay | Admitting: Nurse Practitioner

## 2020-10-10 ENCOUNTER — Ambulatory Visit (INDEPENDENT_AMBULATORY_CARE_PROVIDER_SITE_OTHER): Payer: Medicare Other | Admitting: Nurse Practitioner

## 2020-10-10 VITALS — BP 116/78 | HR 99 | Temp 99.8°F | Wt 218.0 lb

## 2020-10-10 DIAGNOSIS — G4733 Obstructive sleep apnea (adult) (pediatric): Secondary | ICD-10-CM

## 2020-10-10 DIAGNOSIS — E785 Hyperlipidemia, unspecified: Secondary | ICD-10-CM

## 2020-10-10 DIAGNOSIS — E1169 Type 2 diabetes mellitus with other specified complication: Secondary | ICD-10-CM | POA: Diagnosis not present

## 2020-10-10 DIAGNOSIS — M85851 Other specified disorders of bone density and structure, right thigh: Secondary | ICD-10-CM | POA: Diagnosis not present

## 2020-10-10 DIAGNOSIS — E538 Deficiency of other specified B group vitamins: Secondary | ICD-10-CM

## 2020-10-10 DIAGNOSIS — E1141 Type 2 diabetes mellitus with diabetic mononeuropathy: Secondary | ICD-10-CM

## 2020-10-10 DIAGNOSIS — I152 Hypertension secondary to endocrine disorders: Secondary | ICD-10-CM | POA: Diagnosis not present

## 2020-10-10 DIAGNOSIS — E1140 Type 2 diabetes mellitus with diabetic neuropathy, unspecified: Secondary | ICD-10-CM | POA: Diagnosis not present

## 2020-10-10 DIAGNOSIS — E1159 Type 2 diabetes mellitus with other circulatory complications: Secondary | ICD-10-CM | POA: Diagnosis not present

## 2020-10-10 LAB — BAYER DCA HB A1C WAIVED: HB A1C (BAYER DCA - WAIVED): 6.4 % (ref <7.0)

## 2020-10-10 MED ORDER — METOPROLOL SUCCINATE ER 25 MG PO TB24
25.0000 mg | ORAL_TABLET | Freq: Every day | ORAL | 4 refills | Status: DC
Start: 2020-10-10 — End: 2021-08-15

## 2020-10-10 MED ORDER — METFORMIN HCL 500 MG PO TABS: 2.0000 | ORAL_TABLET | Freq: Two times a day (BID) | ORAL | 4 refills | Status: DC

## 2020-10-10 MED ORDER — ALLOPURINOL 300 MG PO TABS: 300.0000 mg | ORAL_TABLET | Freq: Every day | ORAL | 4 refills | Status: DC

## 2020-10-10 MED ORDER — VALSARTAN-HYDROCHLOROTHIAZIDE 160-12.5 MG PO TABS: 1.0000 | ORAL_TABLET | Freq: Every day | ORAL | 4 refills | Status: DC

## 2020-10-10 MED ORDER — GEMFIBROZIL 600 MG PO TABS: 600.0000 mg | ORAL_TABLET | Freq: Two times a day (BID) | ORAL | 4 refills | Status: DC

## 2020-10-10 MED ORDER — GABAPENTIN 300 MG PO CAPS: ORAL_CAPSULE | ORAL | 4 refills | Status: DC

## 2020-10-10 NOTE — Assessment & Plan Note (Signed)
Chronic, ongoing.  A1C 6.4% today, staying stable, praised for this and urine ALB 10 last visit.  Continue Metformin 1000 MG BID and adjust regimen as needed.  Focus on diabetic diet at home and regular exercise.  Continue Gabapentin daily for neuropathy, will adjust dose to 900 MG at night and 300 MG in morning.  Current CrCl = 130 based on recent labs.  Will recheck B12 level today, continue supplement -- may need to consider injections monthly if remains low.  Recommend she monitor feet closely for any wounds or skin breakdown, alert provider immediately if present.  Return in 3 months.

## 2020-10-10 NOTE — Assessment & Plan Note (Signed)
Continue nightly use, praised for this.

## 2020-10-10 NOTE — Assessment & Plan Note (Signed)
Chronic, stable with BP below goal on home readings and at goal in office. Continue current medication regimen and adjust as needed based on BP readings + focus on DASH diet.  CMP and TSH next visit. Could consider discontinuation of HCTZ at upcoming visit, as BP well controlled and history of gout.  Return in 3 months.

## 2020-10-10 NOTE — Assessment & Plan Note (Signed)
Chronic, stable.  Continue current medication regimen and adjust as needed.  Lipid panel next visit.  Recent LDL <70.

## 2020-10-10 NOTE — Progress Notes (Signed)
BP 116/78   Pulse 99   Temp 99.8 F (37.7 C) (Oral)   Wt 218 lb (98.9 kg)   LMP  (LMP Unknown)   SpO2 95%   BMI 36.06 kg/m    Subjective:    [name], female    DOB: 08-Dec-1945, 75 y.o.   MRN: [id number]  HPI: Catherine Hinton is a 75 y.o. female  Chief Complaint  Patient presents with  . Diabetes  . Hyperlipidemia  . Hypertension  . Follow-up    Patient would like to discuss Gabapentin prescription due to it not helping the pain as much and she has since noticed itchiness.    DIABETES Last A1C was October 6.5%.  Continues on Metformin 500 MG twice daily. Last visit B12 level was 198 -- continues taking B12 daily. 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 [name].  Continues to have foot discomfort, L>R -- itchy sensation and burning.  This initially helped, but not helping as much as night anymore.   Hypoglycemic episodes:no Polydipsia/polyuria: no Visual disturbance: no Chest pain: no Paresthesias: no Glucose Monitoring: yes  Accucheck frequency: 6 times a week  Fasting glucose: 130-140 average -- 160 this morning  Post prandial:  Evening:  Before meals: Taking Insulin?: no  Long acting insulin:  Short acting insulin: Blood Pressure Monitoring: daily Retinal Examination: Up to Date Foot Exam: Up to Date Pneumovax: Up to Date Influenza: Up to Date Aspirin: yes   HYPERTENSION / HYPERLIPIDEMIA Continues on Metoprolol and Valsartan-HCTZ, ASA and Lopid.  Has OSA and uses CPAP 100%. Satisfied with current treatment? yes Duration of hypertension: chronic BP monitoring frequency: daily BP range: 120/70 range BP medication side effects: no Duration of hyperlipidemia: chronic Cholesterol medication side effects: no Cholesterol supplements: none Medication compliance: good compliance Aspirin: yes Recent stressors: no Recurrent headaches: no Visual changes: no Palpitations: no Dyspnea: no Chest pain: no Lower extremity edema: baseline, no  worse Dizzy/lightheaded: no   OSTEOPENIA Had DEXA 02/03/20 -- The BMD measured at Femur Neck Right is 0.843 g/cm2 with a T-score of -1.4. Past osteoporosis medications/treatments:  Adequate calcium & vitamin D: yes Weight bearing exercises: yes   Relevant past medical, surgical, family and social history reviewed and updated as indicated. Interim medical history since our last visit reviewed. Allergies and medications reviewed and updated.  Review of Systems  Constitutional: Negative for activity change, appetite change, diaphoresis, fatigue and fever.  Respiratory: Negative for cough, chest tightness, shortness of breath and wheezing.   Cardiovascular: Negative for chest pain, palpitations and leg swelling.  Gastrointestinal: Negative.   Endocrine: Negative for cold intolerance, heat intolerance, polydipsia, polyphagia and polyuria.  Genitourinary: Negative.   Neurological: Negative.   Psychiatric/Behavioral: Negative.     Per HPI unless specifically indicated above     Objective:    BP 116/78   Pulse 99   Temp 99.8 F (37.7 C) (Oral)   Wt 218 lb (98.9 kg)   LMP  (LMP Unknown)   SpO2 95%   BMI 36.06 kg/m   Wt Readings from Last 3 Encounters:  10/10/20 218 lb (98.9 kg)  07/12/20 218 lb 12.8 oz (99.2 kg)  04/08/20 222 lb (100.7 kg)    Physical Exam Vitals and nursing note reviewed.  Constitutional:      General: She is awake. She is not in acute distress.    Appearance: She is well-developed. She is obese. She is not ill-appearing.  HENT:     Head: Normocephalic.     Right  Ear: Hearing, tympanic membrane and ear canal normal. No drainage.     Left Ear: Hearing, tympanic membrane, ear canal and external ear normal. No drainage.     Nose: Nose normal.     Mouth/Throat:     Mouth: Mucous membranes are moist.     Pharynx: Oropharynx is clear.  Eyes:     General: Lids are normal.        Right eye: No discharge.        Left eye: No discharge.     Conjunctiva/sclera:  Conjunctivae normal.     Pupils: Pupils are equal, round, and reactive to light.  Neck:     Vascular: No carotid bruit.  Cardiovascular:     Rate and Rhythm: Normal rate and regular rhythm.     Heart sounds: Normal heart sounds. No murmur heard. No gallop.   Pulmonary:     Effort: Pulmonary effort is normal. No accessory muscle usage or respiratory distress.     Breath sounds: Normal breath sounds.  Abdominal:     General: Bowel sounds are normal.     Palpations: Abdomen is soft. There is no hepatomegaly or splenomegaly.     Tenderness: There is no abdominal tenderness. There is no right CVA tenderness or left CVA tenderness.  Musculoskeletal:     Cervical back: Normal range of motion and neck supple.     Right lower leg: No edema.     Left lower leg: No edema.  Lymphadenopathy:     Cervical: No cervical adenopathy.  Skin:    General: Skin is warm and dry.  Neurological:     Mental Status: She is alert and oriented to person, place, and time.  Psychiatric:        Attention and Perception: Attention normal.        Mood and Affect: Mood normal.        Behavior: Behavior normal. Behavior is cooperative.        Thought Content: Thought content normal.        Judgment: Judgment normal.    Diabetic Foot Exam - Simple   Simple Foot Form Visual Inspection No deformities, no ulcerations, no other skin breakdown bilaterally: Yes Sensation Testing See comments: Yes Pulse Check See comments: Yes Comments 1+ PT and DP bilateral feet.  Sensation diminished bilaterally -- 3/10 points to each foot, more sensation noted posterior foot.  Skin intact with no breakdown or pressure points.    Results for orders placed or performed in visit on 07/12/20  Bayer DCA Hb A1c Waived  Result Value Ref Range   HB A1C (BAYER DCA - WAIVED) 6.7 <2.4 %  Basic metabolic panel  Result Value Ref Range   Glucose 199 (H) 65 - 99 mg/dL   BUN 14 8 - 27 mg/dL   Creatinine, Ser 0.61 0.57 - 1.00 mg/dL    GFR calc non Af Amer 91 >59 mL/min/1.73   GFR calc Af Amer 105 >59 mL/min/1.73   BUN/Creatinine Ratio 23 12 - 28   Sodium 139 134 - 144 mmol/L   Potassium 4.0 3.5 - 5.2 mmol/L   Chloride 96 96 - 106 mmol/L   CO2 22 20 - 29 mmol/L   Calcium 10.0 8.7 - 10.3 mg/dL  Microalbumin, Urine Waived  Result Value Ref Range   Microalb, Ur Waived 10 0 - 19 mg/L   Creatinine, Urine Waived 50 10 - 300 mg/dL   Microalb/Creat Ratio <30 <30 mg/g  Lipid Panel w/o Chol/HDL Ratio  Result Value Ref Range   Cholesterol, Total 144 100 - 199 mg/dL   Triglycerides 222 (H) 0 - 149 mg/dL   HDL 35 (L) >39 mg/dL   VLDL Cholesterol Cal 37 5 - 40 mg/dL   LDL Chol Calc (NIH) 72 0 - 99 mg/dL  TSH  Result Value Ref Range   TSH 0.486 0.450 - 4.500 uIU/mL  Vitamin B12  Result Value Ref Range   Vitamin B-12 198 (L) 232 - 1,245 pg/mL  VITAMIN D 25 Hydroxy (Vit-D Deficiency, Fractures)  Result Value Ref Range   Vit D, 25-Hydroxy 41.5 30.0 - 100.0 ng/mL      Assessment & Plan:   Problem List Items Addressed This Visit      Cardiovascular and Mediastinum   Hypertension associated with diabetes (HCC)    Chronic, stable with BP below goal on home readings and at goal in office. Continue current medication regimen and adjust as needed based on BP readings + focus on DASH diet.  CMP and TSH next visit. Could consider discontinuation of HCTZ at upcoming visit, as BP well controlled and history of gout.  Return in 3 months.      Relevant Medications   metFORMIN (GLUCOPHAGE) 500 MG tablet   metoprolol succinate (TOPROL-XL) 25 MG 24 hr tablet   gemfibrozil (LOPID) 600 MG tablet   valsartan-hydrochlorothiazide (DIOVAN-HCT) 160-12.5 MG tablet   Other Relevant Orders   Bayer DCA Hb A1c Waived     Respiratory   Obstructive sleep apnea    Continue nightly use, praised for this.        Endocrine   Hyperlipidemia associated with type 2 diabetes mellitus (HCC)    Chronic, stable.  Continue current medication regimen  and adjust as needed.  Lipid panel next visit.  Recent LDL <70.      Relevant Medications   metFORMIN (GLUCOPHAGE) 500 MG tablet   gemfibrozil (LOPID) 600 MG tablet   valsartan-hydrochlorothiazide (DIOVAN-HCT) 160-12.5 MG tablet   Other Relevant Orders   Bayer DCA Hb A1c Waived   Type 2 diabetes mellitus with diabetic mononeuropathy, without long-term current use of insulin (HCC) - Primary    Chronic, ongoing.  A1C 6.4% today, staying stable, praised for this and urine ALB 10 last visit.  Continue Metformin 1000 MG BID and adjust regimen as needed.  Focus on diabetic diet at home and regular exercise.  Continue Gabapentin daily for neuropathy, will adjust dose to 900 MG at night and 300 MG in morning.  Current CrCl = 130 based on recent labs.  Will recheck B12 level today, continue supplement -- may need to consider injections monthly if remains low.  Recommend she monitor feet closely for any wounds or skin breakdown, alert provider immediately if present.  Return in 3 months.      Relevant Medications   gabapentin (NEURONTIN) 300 MG capsule   metFORMIN (GLUCOPHAGE) 500 MG tablet   valsartan-hydrochlorothiazide (DIOVAN-HCT) 160-12.5 MG tablet   Other Relevant Orders   Bayer DCA Hb A1c Waived     Musculoskeletal and Integument   Osteopenia    Chronic, ongoing.  Noted on DEXA.  Continue Vitamin D supplement and check level next visit.  DEXA in 2026 next.        Other   Morbid obesity (HCC)    BMI 36.06.  Recommended eating smaller high protein, low fat meals more frequently and exercising 30 mins a day 5 times a week with a goal of 10-15lb weight loss in the next 3  months. Patient voiced their understanding and motivation to adhere to these recommendations.       Relevant Medications   metFORMIN (GLUCOPHAGE) 500 MG tablet   B12 deficiency    Ongoing.  Continue daily supplement and recheck level today.  She is on chronic Metformin, which can cause lower B12 levels.  Educated her on  this.  May consider monthly injections for a bit if level not improved with oral supplement.      Relevant Orders   Vitamin B12       Follow up plan: Return in about 3 months (around 01/09/2021) for Annual physical.

## 2020-10-10 NOTE — Patient Instructions (Signed)
Diabetic Neuropathy Diabetic neuropathy refers to nerve damage that is caused by diabetes. Over time, people with diabetes can develop nerve damage throughout the body. There are several types of diabetic neuropathy:  Peripheral neuropathy. This is the most common type of diabetic neuropathy. It damages the nerves that carry signals between the spinal cord and other parts of the body (peripheral nerves). This usually affects nerves in the feet, legs, hands, and arms.  Autonomic neuropathy. This type causes damage to nerves that control involuntary functions (autonomic nerves). Involuntary functions are functions of the body that you do not control. They include heartbeat, body temperature, blood pressure, urination, digestion, sweating, sexual function, or response to changes in blood glucose.  Focal neuropathy. This type of nerve damage affects one area of the body, such as an arm, a leg, or the face. The injury may involve one nerve or a small group of nerves. Focal neuropathy can be painful and unpredictable. It occurs most often in older adults with diabetes. This often develops suddenly, but usually improves over time and does not cause long-term problems.  Proximal neuropathy. This type of nerve damage affects the nerves of the thighs, hips, buttocks, or legs. It causes severe pain, weakness, and muscle death (atrophy), usually in the thigh muscles. It is more common among older men and people who have type 2 diabetes. The length of recovery time may vary. What are the causes? Peripheral, autonomic, and focal neuropathies are caused by diabetes that is not well controlled with treatment. The cause of proximal neuropathy is not known, but it may be caused by inflammation related to uncontrolled blood glucose levels. What are the signs or symptoms? Peripheral neuropathy Peripheral neuropathy develops slowly over time. When the nerves of the feet and legs no longer work, you may  experience:  Burning, stabbing, or aching pain in the legs or feet.  Pain or cramping in the legs or feet.  Loss of feeling (numbness) and inability to feel pressure or pain in the feet. This can lead to: ? Thick calluses or sores on areas of constant pressure. ? Ulcers. ? Reduced ability to feel temperature changes.  Foot deformities.  Muscle weakness.  Loss of balance or coordination. Autonomic neuropathy The symptoms of autonomic neuropathy vary depending on which nerves are affected. Symptoms may include:  Problems with digestion, such as: ? Nausea or vomiting. ? Poor appetite. ? Bloating. ? Diarrhea or constipation. ? Trouble swallowing. ? Losing weight without trying to.  Problems with the heart, blood, and lungs, such as: ? Dizziness, especially when standing up. ? Fainting. ? Shortness of breath. ? Irregular heartbeat.  Bladder problems, such as: ? Trouble starting or stopping urination. ? Leaking urine. ? Trouble emptying the bladder. ? Urinary tract infections (UTIs).  Problems with other body functions, such as: ? Sweat. You may sweat too much or too little. ? Temperature. You might get hot easily. Or, you might feel cold more than usual. ? Sexual function. Men may not be able to get or maintain an erection. Women may have vaginal dryness and difficulty with arousal. Focal neuropathy Symptoms affect only one area of the body. Common symptoms include:  Numbness.  Tingling.  Burning pain.  Prickling feeling.  Very sensitive skin.  Weakness.  Inability to move (paralysis).  Muscle twitching.  Muscles getting smaller (wasting).  Poor coordination.  Double or blurred vision. Proximal neuropathy  Sudden, severe pain in the hip, thigh, or buttocks. Pain may spread from the back into the legs (  sciatica).  Pain and numbness in the arms and legs.  Tingling.  Loss of bladder control or bowel control.  Weakness and wasting of thigh  muscles.  Difficulty getting up from a seated position.  Abdominal swelling.  Unexplained weight loss. How is this diagnosed? Diagnosis varies depending on the type of neuropathy your health care provider suspects. Peripheral neuropathy Your health care provider will do a neurologic exam. This exam checks your reflexes, how you move, and what you can feel. You may have other tests, such as:  Blood tests.  Tests of the fluid that surrounds the spinal cord (lumbar puncture).  CT scan.  MRI.  Checking the nerves that control muscles (electromyogram, or EMG).  Checking how quickly signals pass through your nerves (nerve conduction study).  Checking a small piece of a nerve using a microscope (biopsy). Autonomic neuropathy You may have tests, such as:  Tests to measure your blood pressure and heart rate. You may be secured to an exam table that moves you from a lying position to an upright position (table tilt test).  Breathing tests to check your lungs.  Tests to check how food moves through the digestive system (gastric emptying tests).  Blood, sweat, or urine tests.  Ultrasound of your bladder.  Spinal fluid tests. Focal neuropathy This condition may be diagnosed with:  A neurologic exam.  CT scan.  MRI.  EMG.  Nerve conduction study. Proximal neuropathy There is no test to diagnose this type of neuropathy. You may have tests to rule out other possible causes of this type of neuropathy. Tests may include:  X-rays of your spine and lumbar region.  Lumbar puncture.  MRI. How is this treated? The goal of treatment is to keep nerve damage from getting worse. Treatment may include:  Following your diabetes management plan. This will help keep your blood glucose level and your A1C level within your target range. This is the most important treatment.  Using prescription pain medicine. Follow these instructions at home: Diabetes management Follow your diabetes  management plan as told by your health care provider.  Check your blood glucose levels.  Keep your blood glucose in your target range.  Have your A1C level checked at least two times a year, or as often as told.  Take over the counter and prescription medicines only as told by your health care provider. This includes insulin and diabetes medicine.   Lifestyle  Do not use any products that contain nicotine or tobacco, such as cigarettes, e-cigarettes, and chewing tobacco. If you need help quitting, ask your health care provider.  Be physically active every day. Include strength training and balance exercises.  Follow a healthy meal plan.  Work with your health care provider to manage your blood pressure.   General instructions  Ask your health care provider if the medicine prescribed to you requires you to avoid driving or using machinery.  Check your skin and feet every day for cuts, bruises, redness, blisters, or sores.  Keep all follow-up visits. This is important. Contact a health care provider if:  You have burning, stabbing, or aching pain in your legs or feet.  You are unable to feel pressure or pain in your feet.  You develop problems with digestion, such as: ? Nausea. ? Vomiting. ? Bloating. ? Constipation. ? Diarrhea. ? Abdominal pain.  You have difficulty with urination, such as: ? Inability to control when you urinate (incontinence). ? Inability to completely empty the bladder (retention).  You feel   as if your heart is racing (palpitations).  You feel dizzy, weak, or faint when you stand up. Get help right away if:  You cannot urinate.  You have sudden weakness or loss of coordination.  You have trouble speaking.  You have pain or pressure in your chest.  You have an irregular heartbeat.  You have sudden inability to move a part of your body. These symptoms may represent a serious problem that is an emergency. Do not wait to see if the symptoms  will go away. Get medical help right away. Call your local emergency services (911 in the [place] Do not drive yourself to the hospital. Summary  Diabetic neuropathy is nerve damage that is caused by diabetes. It can cause numbness and pain in the arms, legs, digestive tract, heart, and other body systems.  This condition is treated by keeping your blood glucose level and your A1C level within your target range. This can help prevent neuropathy from getting worse.  Check your skin and feet every day for cuts, bruises, redness, blisters, or sores.  Do not use any products that contain nicotine or tobacco, such as cigarettes, e-cigarettes, and chewing tobacco. This information is not intended to replace advice given to you by your health care provider. Make sure you discuss any questions you have with your health care provider. Document Revised: 10/15/2019 Document Reviewed: 10/15/2019 [employer] Patient Education  2021 [employer]  

## 2020-10-10 NOTE — Assessment & Plan Note (Signed)
Ongoing.  Continue daily supplement and recheck level today.  She is on chronic Metformin, which can cause lower B12 levels.  Educated her on this.  May consider monthly injections for a bit if level not improved with oral supplement. 

## 2020-10-10 NOTE — Assessment & Plan Note (Signed)
BMI 36.06.  Recommended eating smaller high protein, low fat meals more frequently and exercising 30 mins a day 5 times a week with a goal of 10-15lb weight loss in the next 3 months. Patient voiced their understanding and motivation to adhere to these recommendations.

## 2020-10-10 NOTE — Assessment & Plan Note (Signed)
Chronic, ongoing.  Noted on DEXA.  Continue Vitamin D supplement and check level next visit.  DEXA in 2026 next. 

## 2020-10-11 LAB — VITAMIN B12: Vitamin B-12: 563 pg/mL (ref 232–1245)

## 2020-10-11 NOTE — Progress Notes (Signed)
Contacted via [place] morning [place], your B12 level has much improved, continue daily supplement at this time.  Have a great day!!

## 2020-10-12 ENCOUNTER — Ambulatory Visit (INDEPENDENT_AMBULATORY_CARE_PROVIDER_SITE_OTHER): Payer: Medicare Other | Admitting: Pharmacist

## 2020-10-12 DIAGNOSIS — E1141 Type 2 diabetes mellitus with diabetic mononeuropathy: Secondary | ICD-10-CM

## 2020-10-12 DIAGNOSIS — E785 Hyperlipidemia, unspecified: Secondary | ICD-10-CM

## 2020-10-12 DIAGNOSIS — E1169 Type 2 diabetes mellitus with other specified complication: Secondary | ICD-10-CM

## 2020-10-12 NOTE — Progress Notes (Signed)
Chronic Care Management Pharmacy Note  10/16/2020 Name:  Catherine Hinton MRN:  [id number] DOB:  05-Jul-1945  Subjective: Catherine Hinton is an 75 y.o. year old female who is a primary patient of Cannady, Barbaraann Faster, NP.  The CCM team was consulted for assistance with disease management and care coordination needs.    Engaged with patient by telephone for follow up visit in response to provider referral for pharmacy case management and/or care coordination services.   Consent to Services:  The patient was given information about Chronic Care Management services, agreed to services, and gave verbal consent prior to initiation of services.  Please see initial visit note for detailed documentation.   Patient Care Team: [name], NP as PCP - General (Nurse Practitioner) Anell Barr, [place] (Optometry) Vladimir Faster, [facility] (Pharmacist)  Recent office visits: 4/25/22Ned Card (PCP)- A1c, B12,  Recent consult visits: NA  Hospital visits: None in previous 6 months  Objective:  Lab Results  Component Value Date   CREATININE 0.61 07/12/2020   BUN 14 07/12/2020   GFRNONAA 91 07/12/2020   GFRAA 105 07/12/2020   NA 139 07/12/2020   K 4.0 07/12/2020   CALCIUM 10.0 07/12/2020   CO2 22 07/12/2020   GLUCOSE 199 (H) 07/12/2020    Lab Results  Component Value Date/Time   HGBA1C 6.4 10/10/2020 10:09 AM   HGBA1C 6.7 07/12/2020 09:45 AM   HGBA1C 6.8 11/06/2017 12:00 AM   MICROALBUR 10 07/12/2020 09:45 AM   MICROALBUR 10 07/10/2019 09:07 AM    Last diabetic Eye exam:  Lab Results  Component Value Date/Time   HMDIABEYEEXA No Retinopathy 05/25/2020 12:00 AM    Last diabetic Foot exam:  Lab Results  Component Value Date/Time   HMDIABFOOTEX PP 11/02/2014 12:00 AM     Lab Results  Component Value Date   CHOL 144 07/12/2020   HDL 35 (L) 07/12/2020   LDLCALC 72 07/12/2020   TRIG 222 (H) 07/12/2020    Hepatic Function Latest Ref Rng & Units 10/02/2019 09/18/2019 03/24/2019   Total Protein 6.0 - 8.5 g/dL 7.1 7.1 6.7  Albumin 3.7 - 4.7 g/dL 4.7 4.8(H) 4.5  AST 0 - 40 IU/L '16 19 21  ' ALT 0 - 32 IU/L '20 17 17  ' Alk Phosphatase 39 - 117 IU/L 46 48 49  Total Bilirubin 0.0 - 1.2 mg/dL 0.4 0.4 0.5    Lab Results  Component Value Date/Time   TSH 0.486 07/12/2020 09:47 AM   TSH 0.530 12/17/2019 09:03 AM    CBC Latest Ref Rng & Units 10/02/2019 09/18/2019 05/05/2018  WBC 3.4 - 10.8 x10E3/uL 5.4 6.7 5.5  Hemoglobin 11.1 - 15.9 g/dL 13.3 13.8 12.3  Hematocrit 34.0 - 46.6 % 39.6 39.4 36.1  Platelets 150 - 450 x10E3/uL 319 291 270    Lab Results  Component Value Date/Time   VD25OH 41.5 07/12/2020 09:47 AM   VD25OH 43.6 10/02/2019 10:43 AM    Clinical ASCVD: No  The 10-year ASCVD risk score Mikey Bussing DC Jr., et al., 2013) is: 23%   Values used to calculate the score:     Age: 60 years     Sex: Female     Is Non-Hispanic African American: No     Diabetic: Yes     Tobacco smoker: No     Systolic Blood Pressure: 782 mmHg     Is BP treated: Yes     HDL Cholesterol: 35 mg/dL     Total Cholesterol: 144 mg/dL  Depression screen [facility] 2/9 07/12/2020 12/25/2019 06/24/2019  Decreased Interest 0 0 0  Down, Depressed, Hopeless 0 0 0  PHQ - 2 Score 0 0 0  Altered sleeping 0 3 -  Tired, decreased energy 0 1 -  Change in appetite 0 3 -  Feeling bad or failure about yourself  0 0 -  Trouble concentrating 0 0 -  Moving slowly or fidgety/restless 0 0 -  Suicidal thoughts 0 0 -  PHQ-9 Score 0 7 -  Difficult doing work/chores - Not difficult at all -      Social History   Tobacco Use  Smoking Status Never Smoker  Smokeless Tobacco Never Used   BP Readings from Last 3 Encounters:  10/10/20 116/78  07/12/20 130/80  04/08/20 135/75   Pulse Readings from Last 3 Encounters:  10/10/20 99  07/12/20 88  04/08/20 90   Wt Readings from Last 3 Encounters:  10/10/20 218 lb (98.9 kg)  07/12/20 218 lb 12.8 oz (99.2 kg)  04/08/20 222 lb (100.7 kg)   BMI Readings from Last  3 Encounters:  10/10/20 36.06 kg/m  07/12/20 36.19 kg/m  04/08/20 39.33 kg/m    Assessment/Interventions: Review of patient past medical history, allergies, medications, health status, including review of consultants reports, laboratory and other test data, was performed as part of comprehensive evaluation and provision of chronic care management services.   SDOH:  (Social Determinants of Health) assessments and interventions performed: No  SDOH Screenings   Alcohol Screen: Not on file  Depression (PHQ2-9): Low Risk   . PHQ-2 Score: 0  Financial Resource Strain: Low Risk   . Difficulty of Paying Living Expenses: Not very hard  Food Insecurity: No Food Insecurity  . Worried About Charity fundraiser in the Last Year: Never true  . Ran Out of Food in the Last Year: Never true  Housing: Not on file  Physical Activity: Inactive  . Days of Exercise per Week: 0 days  . Minutes of Exercise per Session: 0 min  Social Connections: Not on file  Stress: No Stress Concern Present  . Feeling of Stress : Not at all  Tobacco Use: Low Risk   . Smoking Tobacco Use: Never Smoker  . Smokeless Tobacco Use: Never Used  Transportation Needs: No Transportation Needs  . Lack of Transportation (Medical): No  . Lack of Transportation (Non-Medical): No    CCM Care Plan  Allergies  Allergen Reactions  . Niacin Itching  . Sulfa Antibiotics Other (See Comments)    Fever    Medications Reviewed Today    Reviewed by Vladimir Faster, [facility] (Pharmacist) on 10/12/20 at 1011  Med List Status: <None>  Medication Order Taking? Sig Documenting Provider Last Dose Status Informant  allopurinol (ZYLOPRIM) 300 MG tablet [id number] Yes Take 1 tablet (300 mg total) by mouth daily. Marnee Guarneri T, NP Taking Active   aspirin 81 MG EC tablet [id number] Yes Take 81 mg by mouth daily.  [provider] Taking Active   Cholecalciferol 25 MCG (1000 UT) tablet [id number] Yes Take 2,000 Units by mouth daily.   [provider] Taking Active            Med Note (Argyle, Ronan M   Wed Dec 01, 2014 10:36 AM) Received from: [place]: Take by mouth.  DHA-EPA-Flaxseed Oil-Vitamin E (THERA TEARS NUTRITION PO) [id number] Yes Take 1 mg by mouth 2 (two) times daily.  [provider] Taking Active   gabapentin (  NEURONTIN) 300 MG capsule [id number] Yes TAKE 1 CAPSULE EVERY MORNING AND TAKE 3 CAPSULES EVERY NIGHT BEFORE BEDTIME Cannady, [name] T, NP Taking Active   gemfibrozil (LOPID) 600 MG tablet [id number] Yes Take 1 tablet (600 mg total) by mouth 2 (two) times daily. Marnee Guarneri T, NP Taking Active   latanoprost (XALATAN) 0.005 % ophthalmic solution [id number] Yes 1 drop at bedtime. [provider] Taking Active   metFORMIN (GLUCOPHAGE) 500 MG tablet [id number] Yes Take 2 tablets (1,000 mg total) by mouth 2 (two) times daily. Marnee Guarneri T, NP Taking Active   metoprolol succinate (TOPROL-XL) 25 MG 24 hr tablet [id number] Yes Take 1 tablet (25 mg total) by mouth daily. Marnee Guarneri T, NP Taking Active   Multiple Vitamins-Minerals (EYE VITAMINS PO) [id number] Yes Take 1 tablet by mouth daily. [provider] Taking Active   valsartan-hydrochlorothiazide (DIOVAN-HCT) 160-12.5 MG tablet [id number] Yes Take 1 tablet by mouth daily. Marnee Guarneri T, NP Taking Active   vitamin B-12 (CYANOCOBALAMIN) 1000 MCG tablet [id number] Yes Take 1,000 mcg by mouth daily. [provider] Taking Active   XIIDRA 5 % SOLN [id number] Yes Place 1 drop into both eyes 2 (two) times daily.  [provider] Taking Active Self           Med Note Shelia Media, Ricky Ala Apr 25, 2015  9:45 AM) Received from: External Pharmacy          Patient Active Problem List   Diagnosis Date Noted  . Osteopenia 04/08/2020  . B12 deficiency 10/04/2019  . Gout 06/24/2019  . Vitamin D deficiency 05/05/2018  . Type 2 diabetes mellitus with diabetic  mononeuropathy, without long-term current use of insulin ([place]) 11/06/2017  . Hypertension associated with diabetes ([place]) 10/26/2015  . Dermatochalasis of both upper eyelids 07/21/2015  . Squamous blepharitis of upper and lower eyelids of both eyes 07/21/2015  . Obstructive sleep apnea 04/22/2015  . Hyperlipidemia associated with type 2 diabetes mellitus ([place]) 04/22/2015  . Morbid obesity ([place]) 04/22/2015  . Insomnia 04/22/2015    Immunization History  Administered Date(s) Administered  . Fluad Quad(high Dose 65+) 03/24/2019, 04/08/2020  . Influenza, High Dose Seasonal PF 04/25/2016, 04/04/2017, 02/18/2018  . Influenza,inj,Quad PF,6+ Mos 04/25/2015  . Influenza-Unspecified 04/18/2012  . PFIZER(Purple Top)SARS-COV-2 Vaccination 07/29/2019, 08/19/2019, 03/20/2020  . Pneumococcal Conjugate-13 04/20/2014  . Pneumococcal Polysaccharide-23 10/26/2015  . Pneumococcal-Unspecified 06/18/2006  . Tdap 12/12/2010  . Zoster 09/21/2013    Conditions to be addressed/monitored:  Hypertension, Hyperlipidemia, Diabetes, Osteopenia, Gout and neuropathy  Care Plan : [place]  Updates made by Vladimir Faster, [place] since 10/16/2020 12:00 AM    Problem: DM, HTN, HLD, Osteopenia, gout   Priority: High    Long-Range Goal: Disease Management   Start Date: 10/12/2020  This Visit's Progress: On track  Priority: High  Note:    Current Barriers:  . Suboptimal therapeutic regimen for Cholesterol, ASCVD prevention  Pharmacist Clinical Goal(s):  [name] Patient will achieve adherence to monitoring guidelines and medication adherence to achieve therapeutic efficacy . maintain control of diabetes and hypertension as evidenced by lab values  through collaboration with PharmD and provider.   Interventions: . 1:1 collaboration with [name], NP regarding development and update of comprehensive plan of care as evidenced by provider attestation and co-signature . Inter-disciplinary care team  collaboration (see longitudinal plan of care) . Comprehensive medication review performed; medication list updated in electronic medical record BP Readings from Last 3 Encounters:  10/10/20  116/78  07/12/20 130/80  04/08/20 135/75    Hypertension (BP goal <130/80) -Controlled -Current treatment: . Valsartan.HCTZ 160/ 12.5 mg qd . Metoprolol Succinate 25 mg qd -Medications previously tried: NA -Current home readings: <130/80 - -Denies hypotensive/hypertensive symptoms -Educated on BP goals and benefits of medications for prevention of heart attack, stroke and kidney damage; Daily salt intake goal < 2300 mg; -Counseled to monitor BP at home 2-3 times weekly, document, and provide log at future appointments -Recommended to continue current medication  The 10-year ASCVD risk score Mikey Bussing DC Jr., et al., 2013) is: 23%  Lab Results  Component Value Date   LDLCALC 72 07/12/2020    Hyperlipidemia: (LDL goal < 70) -Not ideally controlled -Current treatment: . Gemfibrozil 600 mg bid . Aspirin 81 mg qd  -Medications previously tried: NA  -Educated on Benefits of statin for ASCVD risk reduction; Exercise goal of 150 minutes per week; discussed risk vs. Benefit of aspirin.  -Recommended to continue current medication Recommended adding low intensity statin to current regimen. Could utilize three times weekly dosing to decrease pill burden. Lab Results  Component Value Date/Time   HGBA1C 6.4 10/10/2020 10:09 AM   HGBA1C 6.7 07/12/2020 09:45 AM   HGBA1C 6.8 11/06/2017 12:00 AM    Diabetes/ Peripheral neuropathy (A1c goal <7%) -Controlled -Current medications:  Metformin 1000 mg bid  Gabapentin 300 mg qam, 900 mg qpm -Medications previously tried: NA   -Current home glucose readings . fasting glucose: 135, 132, 141, 138 . post prandial glucose: -Denies hypoglycemic/hyperglycemic symptoms -Current meal patterns:  . breakfast: eats daily, varies  . lunch: biggest meal,  doesn't really like meat, eating more fish lately . dinner: usually left over lunch . snacks: popcorn occassionally . drinks: water mostly, one cup coffee, 1-2 soft drinks -Current exercise: No formal exercise, yard work, walks inside house -Educated on A1c and blood sugar goals; Complications of diabetes including kidney damage, retinal damage, and cardiovascular disease; Exercise goal of 150 minutes per week; Prevention and management of hypoglycemic episodes; -Counseled to check feet daily and get yearly eye exams -Counseled on diet and exercise extensively Recommended to continue current medication  Last vitamin D Lab Results  Component Value Date   VD25OH 41.5 07/12/2020    Osteoporosis / Osteopenia (Goal prevent fracture) -Controlled -Last DEXA Scan: 02/03/20             T-Score femoral neck: -1.4             T-Score lumbar spine: -0.4             10-year probability of major osteoporotic fracture: 14.4%             10-year probability of hip fracture: 1.9% -Patient is not a candidate for pharmacologic treatment -Current treatment  . Vitamin D 2000 iu qd -Medications previously tried: NA -Recommend 1200 mg of calcium daily from dietary and supplemental sources. Recommend weight-bearing and muscle strengthening exercises for building and maintaining bone density. -Counseled on diet and exercise extensively Recommended patient begin calcium supplementation in additon to vitamin D.  Gout (Goal: prevent flares) -Controlled -Current treatment  . Allopurinol 300 mg qd -Medications previously tried: na  -Counseled on diet and exercise extensively Recommended to continue current medication  Health Maintenance -Vaccine gaps: NONE -Current therapy:  . MVI for eyes qd preservation . B12 1000 mcg qd . DHA-EPA-Flaxseed- Vit E qd . Xidra 1 drop both eyes bid -Educated on importance of discussing with provider or pharmacist before starting new  supplements -Patient is satisfied  with current therapy and denies issues -Counseled on diet and exercise extensively Recommended to continue current medication Assessed patient finances. Get Shirley Friar through patient assistance completed by eye doctors office.   Patient Goals/Self-Care Activities . Patient will:  - take medications as prescribed check glucose daily, document, and provide at future appointments check blood pressure weekly, document, and provide at future appointments target a minimum of 150 minutes of moderate intensity exercise weekly  Follow Up Plan: Telephone follow up appointment with care management team member scheduled for:3 months       Medication Assistance: None required.  Patient affirms current coverage meets needs.  Patient's preferred pharmacy is:  [facility], [place] [address] Phone: [phone] Fax: [phone]  [id number] [place], [place] [id number]: [phone]: [phone] Uses pill box? Yes Pt endorses 99% compliance  We discussed: Current pharmacy is preferred with insurance plan and patient is satisfied with pharmacy services Patient decided to: Continue current medication management strategy  Care Plan and Follow Up Patient Decision:  Patient agrees to Care Plan and Follow-up.  Plan: Telephone follow up appointment with care management team member scheduled for:  3 months PharmD 6-8 weeks CPA  JJunita Push HKenton KingfisherPharmD, BAmada AcresMOrange County Global Medical [phone]

## 2020-10-16 NOTE — Patient Instructions (Addendum)
Visit Information  It was a pleasure speaking with you today. Thank you for letting me be part of your clinical team. Please call with any questions or concerns.   Goals Addressed            This Visit's Progress   . Monitor and Manage My Blood Sugar-Diabetes Type 2       Timeframe:  Long-Range Goal Priority:  High Start Date:                             Expected End Date:                       Follow Up Date 3 month visit   - check blood sugar at prescribed times - check blood sugar if I feel it is too high or too low - take the blood sugar meter to all doctor visits    Why is this important?    Checking your blood sugar at home helps to keep it from getting very high or very low.   Writing the results in a diary or log helps the doctor know how to care for you.   Your blood sugar log should have the time, date and the results.   Also, write down the amount of insulin or other medicine that you take.   Other information, like what you ate, exercise done and how you were feeling, will also be helpful.     Notes:     . Prevent Falls and Broken Bones-Osteoporosis       Timeframe:  Long-Range Goal Priority:  High Start Date:                             Expected End Date:                       Follow Up Date 3 month follow up    - always use handrails on the stairs - get at least 10 minutes of activity every day - make an emergency alert plan in case I fall    Why is this important?    When you fall, there are 3 things that control if a bone breaks or not.   These are the fall itself, how hard and the direction that you fall and how fragile your bones are.   Preventing falls is very important for you because of fragile bones.     Notes:     . Track and Manage My Blood Pressure-Hypertension       Timeframe:  Long-Range Goal Priority:  High Start Date:                             Expected End Date:                       Follow Up Date 3 month follow up    -  check blood pressure 3 times per week - write blood pressure results in a log or diary    Why is this important?    You won't feel high blood pressure, but it can still hurt your blood vessels.   High blood pressure can cause heart or kidney problems. It can also cause a stroke.   Making lifestyle changes like losing a  little weight or eating less salt will help.   Checking your blood pressure at home and at different times of the day can help to control blood pressure.   If the doctor prescribes medicine remember to take it the way the doctor ordered.   Call the office if you cannot afford the medicine or if there are questions about it.     Notes:        The patient verbalized understanding of instructions, educational materials, and care plan provided today and agreed to receive a mailed copy of patient instructions, educational materials, and care plan.   Telephone follow up appointment with pharmacy team member scheduled for: 3 months   [name]. Kenton Kingfisher PharmD, BCPS Clinical Pharmacist [phone]  Diabetic Neuropathy Diabetic neuropathy refers to nerve damage that is caused by diabetes. Over time, people with diabetes can develop nerve damage throughout the body. There are several types of diabetic neuropathy:  Peripheral neuropathy. This is the most common type of diabetic neuropathy. It damages the nerves that carry signals between the spinal cord and other parts of the body (peripheral nerves). This usually affects nerves in the feet, legs, hands, and arms.  Autonomic neuropathy. This type causes damage to nerves that control involuntary functions (autonomic nerves). Involuntary functions are functions of the body that you do not control. They include heartbeat, body temperature, blood pressure, urination, digestion, sweating, sexual function, or response to changes in blood glucose.  Focal neuropathy. This type of nerve damage affects one area of the body, such as an  arm, a leg, or the face. The injury may involve one nerve or a small group of nerves. Focal neuropathy can be painful and unpredictable. It occurs most often in older adults with diabetes. This often develops suddenly, but usually improves over time and does not cause long-term problems.  Proximal neuropathy. This type of nerve damage affects the nerves of the thighs, hips, buttocks, or legs. It causes severe pain, weakness, and muscle death (atrophy), usually in the thigh muscles. It is more common among older men and people who have type 2 diabetes. The length of recovery time may vary. What are the causes? Peripheral, autonomic, and focal neuropathies are caused by diabetes that is not well controlled with treatment. The cause of proximal neuropathy is not known, but it may be caused by inflammation related to uncontrolled blood glucose levels. What are the signs or symptoms? Peripheral neuropathy Peripheral neuropathy develops slowly over time. When the nerves of the feet and legs no longer work, you may experience:  Burning, stabbing, or aching pain in the legs or feet.  Pain or cramping in the legs or feet.  Loss of feeling (numbness) and inability to feel pressure or pain in the feet. This can lead to: ? Thick calluses or sores on areas of constant pressure. ? Ulcers. ? Reduced ability to feel temperature changes.  Foot deformities.  Muscle weakness.  Loss of balance or coordination. Autonomic neuropathy The symptoms of autonomic neuropathy vary depending on which nerves are affected. Symptoms may include:  Problems with digestion, such as: ? Nausea or vomiting. ? Poor appetite. ? Bloating. ? Diarrhea or constipation. ? Trouble swallowing. ? Losing weight without trying to.  Problems with the heart, blood, and lungs, such as: ? Dizziness, especially when standing up. ? Fainting. ? Shortness of breath. ? Irregular heartbeat.  Bladder problems, such as: ? Trouble  starting or stopping urination. ? Leaking urine. ? Trouble emptying the bladder. ? Urinary tract  infections (UTIs).  Problems with other body functions, such as: ? Sweat. You may sweat too much or too little. ? Temperature. You might get hot easily. Or, you might feel cold more than usual. ? Sexual function. Men may not be able to get or maintain an erection. Women may have vaginal dryness and difficulty with arousal. Focal neuropathy Symptoms affect only one area of the body. Common symptoms include:  Numbness.  Tingling.  Burning pain.  Prickling feeling.  Very sensitive skin.  Weakness.  Inability to move (paralysis).  Muscle twitching.  Muscles getting smaller (wasting).  Poor coordination.  Double or blurred vision. Proximal neuropathy  Sudden, severe pain in the hip, thigh, or buttocks. Pain may spread from the back into the legs (sciatica).  Pain and numbness in the arms and legs.  Tingling.  Loss of bladder control or bowel control.  Weakness and wasting of thigh muscles.  Difficulty getting up from a seated position.  Abdominal swelling.  Unexplained weight loss. How is this diagnosed? Diagnosis varies depending on the type of neuropathy your health care provider suspects. Peripheral neuropathy Your health care provider will do a neurologic exam. This exam checks your reflexes, how you move, and what you can feel. You may have other tests, such as:  Blood tests.  Tests of the fluid that surrounds the spinal cord (lumbar puncture).  CT scan.  MRI.  Checking the nerves that control muscles (electromyogram, or EMG).  Checking how quickly signals pass through your nerves (nerve conduction study).  Checking a small piece of a nerve using a microscope (biopsy). Autonomic neuropathy You may have tests, such as:  Tests to measure your blood pressure and heart rate. You may be secured to an exam table that moves you from a lying position to an  upright position (table tilt test).  Breathing tests to check your lungs.  Tests to check how food moves through the digestive system (gastric emptying tests).  Blood, sweat, or urine tests.  Ultrasound of your bladder.  Spinal fluid tests. Focal neuropathy This condition may be diagnosed with:  A neurologic exam.  CT scan.  MRI.  EMG.  Nerve conduction study. Proximal neuropathy There is no test to diagnose this type of neuropathy. You may have tests to rule out other possible causes of this type of neuropathy. Tests may include:  X-rays of your spine and lumbar region.  Lumbar puncture.  MRI. How is this treated? The goal of treatment is to keep nerve damage from getting worse. Treatment may include:  Following your diabetes management plan. This will help keep your blood glucose level and your A1C level within your target range. This is the most important treatment.  Using prescription pain medicine. Follow these instructions at home: Diabetes management Follow your diabetes management plan as told by your health care provider.  Check your blood glucose levels.  Keep your blood glucose in your target range.  Have your A1C level checked at least two times a year, or as often as told.  Take over the counter and prescription medicines only as told by your health care provider. This includes insulin and diabetes medicine.   Lifestyle  Do not use any products that contain nicotine or tobacco, such as cigarettes, e-cigarettes, and chewing tobacco. If you need help quitting, ask your health care provider.  Be physically active every day. Include strength training and balance exercises.  Follow a healthy meal plan.  Work with your health care provider to manage your  blood pressure.   General instructions  Ask your health care provider if the medicine prescribed to you requires you to avoid driving or using machinery.  Check your skin and feet every day for  cuts, bruises, redness, blisters, or sores.  Keep all follow-up visits. This is important. Contact a health care provider if:  You have burning, stabbing, or aching pain in your legs or feet.  You are unable to feel pressure or pain in your feet.  You develop problems with digestion, such as: ? Nausea. ? Vomiting. ? Bloating. ? Constipation. ? Diarrhea. ? Abdominal pain.  You have difficulty with urination, such as: ? Inability to control when you urinate (incontinence). ? Inability to completely empty the bladder (retention).  You feel as if your heart is racing (palpitations).  You feel dizzy, weak, or faint when you stand up. Get help right away if:  You cannot urinate.  You have sudden weakness or loss of coordination.  You have trouble speaking.  You have pain or pressure in your chest.  You have an irregular heartbeat.  You have sudden inability to move a part of your body. These symptoms may represent a serious problem that is an emergency. Do not wait to see if the symptoms will go away. Get medical help right away. Call your local emergency services (911 in the [place] Do not drive yourself to the hospital. Summary  Diabetic neuropathy is nerve damage that is caused by diabetes. It can cause numbness and pain in the arms, legs, digestive tract, heart, and other body systems.  This condition is treated by keeping your blood glucose level and your A1C level within your target range. This can help prevent neuropathy from getting worse.  Check your skin and feet every day for cuts, bruises, redness, blisters, or sores.  Do not use any products that contain nicotine or tobacco, such as cigarettes, e-cigarettes, and chewing tobacco. This information is not intended to replace advice given to you by your health care provider. Make sure you discuss any questions you have with your health care provider. Document Revised: 10/15/2019 Document Reviewed:  10/15/2019 Elsevier Patient Education  [place].

## 2020-11-28 ENCOUNTER — Telehealth: Payer: Self-pay | Admitting: Pharmacist

## 2020-11-28 NOTE — Chronic Care Management (AMB) (Signed)
    Chronic Care Management Pharmacy Assistant   Name: Brittiany Wiehe  MRN: [id number] DOB: June 16, 1946   Reason for Encounter: Disease State Diabetes Mellitus    Recent office visits:  None noted  Recent consult visits:  None noted  Hospital visits:  None in previous 6 months  Medications: Outpatient Encounter Medications as of 11/28/2020  Medication Sig Note   allopurinol (ZYLOPRIM) 300 MG tablet Take 1 tablet (300 mg total) by mouth daily.    aspirin 81 MG EC tablet Take 81 mg by mouth daily.     Cholecalciferol 25 MCG (1000 UT) tablet Take 2,000 Units by mouth daily.  12/01/2014: Received from: [place]: Take by mouth.   DHA-EPA-Flaxseed Oil-Vitamin E (THERA TEARS NUTRITION PO) Take 1 mg by mouth 2 (two) times daily.     gabapentin (NEURONTIN) 300 MG capsule TAKE 1 CAPSULE EVERY MORNING AND TAKE 3 CAPSULES EVERY NIGHT BEFORE BEDTIME    gemfibrozil (LOPID) 600 MG tablet Take 1 tablet (600 mg total) by mouth 2 (two) times daily.    latanoprost (XALATAN) 0.005 % ophthalmic solution 1 drop at bedtime.    metFORMIN (GLUCOPHAGE) 500 MG tablet Take 2 tablets (1,000 mg total) by mouth 2 (two) times daily.    metoprolol succinate (TOPROL-XL) 25 MG 24 hr tablet Take 1 tablet (25 mg total) by mouth daily.    Multiple Vitamins-Minerals (EYE VITAMINS PO) Take 1 tablet by mouth daily.    valsartan-hydrochlorothiazide (DIOVAN-HCT) 160-12.5 MG tablet Take 1 tablet by mouth daily.    vitamin B-12 (CYANOCOBALAMIN) 1000 MCG tablet Take 1,000 mcg by mouth daily.    XIIDRA 5 % SOLN Place 1 drop into both eyes 2 (two) times daily.  04/25/2015: Received from: External Pharmacy   No facility-administered encounter medications on file as of 11/28/2020.   Recent Relevant Labs: Lab Results  Component Value Date/Time   HGBA1C 6.4 10/10/2020 10:09 AM   HGBA1C 6.7 07/12/2020 09:45 AM   HGBA1C 6.8 11/06/2017 12:00 AM   MICROALBUR 10 07/12/2020 09:45 AM   MICROALBUR 10  07/10/2019 09:07 AM    Kidney Function Lab Results  Component Value Date/Time   CREATININE 0.61 07/12/2020 09:47 AM   CREATININE 0.59 04/08/2020 09:33 AM   GFRNONAA 91 07/12/2020 09:47 AM   GFRAA 105 07/12/2020 09:47 AM    Current antihyperglycemic regimen:  Metformin 1000 mg bid Gabapentin 300 mg qam, 900 mg qpm  What recent interventions/DTPs have been made to improve glycemic control:  None noted  Have there been any recent hospitalizations or ED visits since last visit with CPP? No  Patient denies hypoglycemic symptoms, including Pale, Sweaty, Shaky, Hungry, Nervous/irritable, and Vision changes  Patient denies hyperglycemic symptoms, including blurry vision, excessive thirst, fatigue, polyuria, and weakness  How often are you checking your blood sugar? once daily  What are your blood sugars ranging?  Fasting: 125-12/13/20 Before meals:  After meals:  Bedtime:   During the week, how often does your blood glucose drop below 70? Never  Are you checking your feet daily/regularly?        Patient states she does check her feet daily.  Adherence Review: Is the patient currently on a STATIN medication? No Is the patient currently on ACE/ARB medication? Yes Does the patient have >5 day gap between last estimated fill dates? Yes   Star Rating Drugs: Metformin 500 mg Last filled:03/06/2020 90 DS Valsartan-hydrochlorothiazide 160-12.5 mg Last filled:07/22/20 90 DS  Myriam Elta [place], [place]

## 2020-12-03 DIAGNOSIS — Z23 Encounter for immunization: Secondary | ICD-10-CM | POA: Diagnosis not present

## 2020-12-08 ENCOUNTER — Encounter: Payer: Self-pay | Admitting: Nurse Practitioner

## 2020-12-14 ENCOUNTER — Telehealth: Payer: Self-pay | Admitting: Pharmacist

## 2020-12-14 NOTE — Chronic Care Management (AMB) (Signed)
    Chronic Care Management Pharmacy Assistant   Name: Catherine Hinton  MRN: [id number] DOB: 1946/06/07    Reason for Encounter: Chart Review     Medications: Outpatient Encounter Medications as of 12/14/2020  Medication Sig Note   allopurinol (ZYLOPRIM) 300 MG tablet Take 1 tablet (300 mg total) by mouth daily.    aspirin 81 MG EC tablet Take 81 mg by mouth daily.     Cholecalciferol 25 MCG (1000 UT) tablet Take 2,000 Units by mouth daily.  12/01/2014: Received from: [place]: Take by mouth.   DHA-EPA-Flaxseed Oil-Vitamin E (THERA TEARS NUTRITION PO) Take 1 mg by mouth 2 (two) times daily.     gabapentin (NEURONTIN) 300 MG capsule TAKE 1 CAPSULE EVERY MORNING AND TAKE 3 CAPSULES EVERY NIGHT BEFORE BEDTIME    gemfibrozil (LOPID) 600 MG tablet Take 1 tablet (600 mg total) by mouth 2 (two) times daily.    latanoprost (XALATAN) 0.005 % ophthalmic solution 1 drop at bedtime.    metFORMIN (GLUCOPHAGE) 500 MG tablet Take 2 tablets (1,000 mg total) by mouth 2 (two) times daily.    metoprolol succinate (TOPROL-XL) 25 MG 24 hr tablet Take 1 tablet (25 mg total) by mouth daily.    Multiple Vitamins-Minerals (EYE VITAMINS PO) Take 1 tablet by mouth daily.    valsartan-hydrochlorothiazide (DIOVAN-HCT) 160-12.5 MG tablet Take 1 tablet by mouth daily.    vitamin B-12 (CYANOCOBALAMIN) 1000 MCG tablet Take 1,000 mcg by mouth daily.    XIIDRA 5 % SOLN Place 1 drop into both eyes 2 (two) times daily.  04/25/2015: Received from: External Pharmacy   No facility-administered encounter medications on file as of 12/14/2020.    Reviewed chart for medication changes and adherence.  No OVs, Consults, or hospital visits since last care coordination call / Pharmacist visit. No medication changes indicated  No gaps in adherence identified. Patient has follow up scheduled with pharmacy team. No further action required.   [name] Clinical Pharmacist  Assistant [phone]

## 2020-12-21 ENCOUNTER — Telehealth: Payer: Self-pay | Admitting: Pharmacist

## 2020-12-21 ENCOUNTER — Telehealth: Payer: Self-pay

## 2020-12-21 NOTE — Chronic Care Management (AMB) (Signed)
    Chronic Care Management Pharmacy Assistant   Name: Catherine Hinton  MRN: [id number] DOB: [birth year]  Reason for Encounter: Disease State - General Adherence    Recent office visits:  None noted  Recent consult visits:  None noted  Hospital visits:  None in previous 6 months  Medications: Outpatient Encounter Medications as of 12/21/2020  Medication Sig Note   allopurinol (ZYLOPRIM) 300 MG tablet Take 1 tablet (300 mg total) by mouth daily.    aspirin 81 MG EC tablet Take 81 mg by mouth daily.     Cholecalciferol 25 MCG (1000 UT) tablet Take 2,000 Units by mouth daily.  12/01/2014: Received from: [place]: Take by mouth.   DHA-EPA-Flaxseed Oil-Vitamin E (THERA TEARS NUTRITION PO) Take 1 mg by mouth 2 (two) times daily.     gabapentin (NEURONTIN) 300 MG capsule TAKE 1 CAPSULE EVERY MORNING AND TAKE 3 CAPSULES EVERY NIGHT BEFORE BEDTIME    gemfibrozil (LOPID) 600 MG tablet Take 1 tablet (600 mg total) by mouth 2 (two) times daily.    latanoprost (XALATAN) 0.005 % ophthalmic solution 1 drop at bedtime.    metFORMIN (GLUCOPHAGE) 500 MG tablet Take 2 tablets (1,000 mg total) by mouth 2 (two) times daily.    metoprolol succinate (TOPROL-XL) 25 MG 24 hr tablet Take 1 tablet (25 mg total) by mouth daily.    Multiple Vitamins-Minerals (EYE VITAMINS PO) Take 1 tablet by mouth daily.    valsartan-hydrochlorothiazide (DIOVAN-HCT) 160-12.5 MG tablet Take 1 tablet by mouth daily.    vitamin B-12 (CYANOCOBALAMIN) 1000 MCG tablet Take 1,000 mcg by mouth daily.    XIIDRA 5 % SOLN Place 1 drop into both eyes 2 (two) times daily.  04/25/2015: Received from: External Pharmacy   No facility-administered encounter medications on file as of 12/21/2020.   Have you had any problems recently with your health? Patient stated she has no problems with her health at this time.  Have you had any problems with your pharmacy? Patient stated she has no problems with her pharmacy  at this time.   What issues or side effects are you having with your medications? Patient stated she tried taking Calcium tablets for 5-6 days but had to stop due to constipation. She stated she has been trying to supplement with Calcium rich foods instead, such as drinking more milk, eating a little bit of cheese and yogurt even though she does not like the taste.  What would you like me to pass along to [facility] for them to help you with? Patient stated she has nothing to pass along at this time.   What can we do to take care of you better? Patient stated there is nothing else at this time.   Star Rating Drugs: Metformin 500 mg last filled 03/06/20 90 DS Valsartan-Hydrochlorothiazide 160-12.5 mg last filled 07/22/2020 90 DS  Myriam Elta [place], [place]

## 2021-01-13 ENCOUNTER — Encounter: Payer: No Typology Code available for payment source | Admitting: Nurse Practitioner

## 2021-02-04 ENCOUNTER — Encounter: Payer: Self-pay | Admitting: Nurse Practitioner

## 2021-02-04 DIAGNOSIS — I700 Atherosclerosis of aorta: Secondary | ICD-10-CM | POA: Insufficient documentation

## 2021-02-06 DIAGNOSIS — H04129 Dry eye syndrome of unspecified lacrimal gland: Secondary | ICD-10-CM | POA: Diagnosis not present

## 2021-02-06 DIAGNOSIS — H40019 Open angle with borderline findings, low risk, unspecified eye: Secondary | ICD-10-CM | POA: Diagnosis not present

## 2021-02-06 DIAGNOSIS — H02883 Meibomian gland dysfunction of right eye, unspecified eyelid: Secondary | ICD-10-CM | POA: Diagnosis not present

## 2021-02-06 DIAGNOSIS — H02886 Meibomian gland dysfunction of left eye, unspecified eyelid: Secondary | ICD-10-CM | POA: Diagnosis not present

## 2021-02-06 DIAGNOSIS — H353131 Nonexudative age-related macular degeneration, bilateral, early dry stage: Secondary | ICD-10-CM | POA: Diagnosis not present

## 2021-02-06 DIAGNOSIS — E119 Type 2 diabetes mellitus without complications: Secondary | ICD-10-CM | POA: Diagnosis not present

## 2021-02-06 DIAGNOSIS — H26492 Other secondary cataract, left eye: Secondary | ICD-10-CM | POA: Diagnosis not present

## 2021-02-07 ENCOUNTER — Encounter: Payer: Self-pay | Admitting: Nurse Practitioner

## 2021-02-07 ENCOUNTER — Ambulatory Visit (INDEPENDENT_AMBULATORY_CARE_PROVIDER_SITE_OTHER): Payer: Medicare Other | Admitting: Nurse Practitioner

## 2021-02-07 ENCOUNTER — Other Ambulatory Visit: Payer: Self-pay

## 2021-02-07 VITALS — BP 119/69 | HR 92 | Temp 98.6°F | Ht 65.0 in | Wt 209.8 lb

## 2021-02-07 DIAGNOSIS — I700 Atherosclerosis of aorta: Secondary | ICD-10-CM

## 2021-02-07 DIAGNOSIS — E538 Deficiency of other specified B group vitamins: Secondary | ICD-10-CM | POA: Diagnosis not present

## 2021-02-07 DIAGNOSIS — E1159 Type 2 diabetes mellitus with other circulatory complications: Secondary | ICD-10-CM

## 2021-02-07 DIAGNOSIS — M85851 Other specified disorders of bone density and structure, right thigh: Secondary | ICD-10-CM | POA: Diagnosis not present

## 2021-02-07 DIAGNOSIS — Z0000 Encounter for general adult medical examination without abnormal findings: Secondary | ICD-10-CM

## 2021-02-07 DIAGNOSIS — E1141 Type 2 diabetes mellitus with diabetic mononeuropathy: Secondary | ICD-10-CM | POA: Diagnosis not present

## 2021-02-07 DIAGNOSIS — E785 Hyperlipidemia, unspecified: Secondary | ICD-10-CM

## 2021-02-07 DIAGNOSIS — R1011 Right upper quadrant pain: Secondary | ICD-10-CM | POA: Diagnosis not present

## 2021-02-07 DIAGNOSIS — E1169 Type 2 diabetes mellitus with other specified complication: Secondary | ICD-10-CM

## 2021-02-07 DIAGNOSIS — G4733 Obstructive sleep apnea (adult) (pediatric): Secondary | ICD-10-CM

## 2021-02-07 DIAGNOSIS — E559 Vitamin D deficiency, unspecified: Secondary | ICD-10-CM

## 2021-02-07 DIAGNOSIS — M1A3610 Chronic gout due to renal impairment, right knee, without tophus (tophi): Secondary | ICD-10-CM

## 2021-02-07 DIAGNOSIS — I152 Hypertension secondary to endocrine disorders: Secondary | ICD-10-CM

## 2021-02-07 LAB — BAYER DCA HB A1C WAIVED: HB A1C (BAYER DCA - WAIVED): 6.6 % (ref <7.0)

## 2021-02-07 MED ORDER — SHINGRIX 50 MCG/0.5ML IM SUSR: 0.5000 mL | Freq: Once | INTRAMUSCULAR | 0 refills | Status: AC

## 2021-02-07 MED ORDER — METFORMIN HCL ER 500 MG PO TB24: 1000.0000 mg | ORAL_TABLET | Freq: Two times a day (BID) | ORAL | 4 refills | Status: DC

## 2021-02-07 NOTE — Patient Instructions (Signed)
Abdominal Pain, Adult Many things can cause belly (abdominal) pain. Most times, belly pain is not dangerous. Many cases of belly pain can be watched and treated at home. Sometimes, though, belly pain is serious. Yourdoctor will try to find the cause of your belly pain. Follow these instructions at home:  Medicines Take over-the-counter and prescription medicines only as told by your doctor. Do not take medicines that help you poop (laxatives) unless told by your doctor. General instructions Watch your belly pain for any changes. Drink enough fluid to keep your pee (urine) pale yellow. Keep all follow-up visits as told by your doctor. This is important. Contact a doctor if: Your belly pain changes or gets worse. You are not hungry, or you lose weight without trying. You are having trouble pooping (constipated) or have watery poop (diarrhea) for more than 2-3 days. You have pain when you pee or poop. Your belly pain wakes you up at night. Your pain gets worse with meals, after eating, or with certain foods. You are vomiting and cannot keep anything down. You have a fever. You have blood in your pee. Get help right away if: Your pain does not go away as soon as your doctor says it should. You cannot stop vomiting. Your pain is only in areas of your belly, such as the right side or the left lower part of the belly. You have bloody or black poop, or poop that looks like tar. You have very bad pain, cramping, or bloating in your belly. You have signs of not having enough fluid or water in your body (dehydration), such as: Dark pee, very little pee, or no pee. Cracked lips. Dry mouth. Sunken eyes. Sleepiness. Weakness. You have trouble breathing or chest pain. Summary Many cases of belly pain can be watched and treated at home. Watch your belly pain for any changes. Take over-the-counter and prescription medicines only as told by your doctor. Contact a doctor if your belly pain  changes or gets worse. Get help right away if you have very bad pain, cramping, or bloating in your belly. This information is not intended to replace advice given to you by your health care provider. Make sure you discuss any questions you have with your healthcare provider. Document Revised: 10/13/2018 Document Reviewed: 10/13/2018 Elsevier Patient Education  2022 [employer]  

## 2021-02-07 NOTE — Progress Notes (Signed)
BP 119/69   Pulse 92   Temp 98.6 F (37 C) (Oral)   Ht '5\' 5"'$  (1.651 m)   Wt 209 lb 12.8 oz (95.2 kg)   LMP  (LMP Unknown)   SpO2 96%   BMI 34.91 kg/m    Subjective:    [name], female    DOB: 1946/05/28, 75 y.o.   MRN: [id number]  HPI: Catherine Hinton is a 75 y.o. female presenting on 02/07/2021 for follow-up and health maintenance review. Current medical complaints include:none  She currently lives with: self Menopausal Symptoms: no   DIABETES Last A1C was 6.4% in April.  Continues on Metformin 1000 MG BID Hypoglycemic episodes:no Polydipsia/polyuria: no Visual disturbance: no Chest pain: no Paresthesias: no Glucose Monitoring: yes             Accucheck frequency: Daily             Fasting glucose: this morning was 135, yesterday 118             Post prandial:             Evening:             Before meals: Taking Insulin?: no             Long acting insulin:             Short acting insulin: Blood Pressure Monitoring: daily Retinal Examination: Up to Date Foot Exam: Up to Date Pneumovax: Up to Date Influenza: Up to Date Aspirin: yes    HYPERTENSION / HYPERLIPIDEMIA Continues on Metoprolol and Valsartan-HCTZ, ASA and Lopid.  Continues on 1000 units Vit D for Vit D deficiency.  Has CPAP and uses this nightly. Satisfied with current treatment? yes Duration of hypertension: chronic BP monitoring frequency: a few times a week BP range: 110-120/70 at home BP medication side effects: no Duration of hyperlipidemia: chronic Cholesterol medication side effects: no Cholesterol supplements: none Medication compliance: good compliance Aspirin: yes Recent stressors: no Recurrent headaches: no Visual changes: no Palpitations: no Dyspnea: no Chest pain: no Lower extremity edema: no Dizzy/lightheaded: no    GOUT Last flare was years ago.  Continues on Allopurinol.   Duration:chronic Swelling: no Redness: no Trauma: no Recent dietary change  or indiscretion: no Fevers: no Nausea/vomiting: no Status:  stable  ABDOMINAL PAIN  Has been present off and on for one month.  Mainly to upper right side, does not have gall bladder -- this was removed 25 or more years ago.  History of total hysterectomy and two hernia surgeries.    Has a bowel movement daily, but not a whole lot -- is soft and liquidy.  She rates this as Type 6 on [place] stool chart.  Has one bowel movement a day, no straining with this.  Does take Sennakot every once and awhile.  Has family history of colon cancer on maternal side -- grandmother, was 65 when diagnosed.  Colonoscopy last on year ago was normal.  No changes in diet recently.  Never a smoker.  No alcohol use at home. Duration:months Onset: sudden Severity: 3/10 Quality: dull and aching Location:  RUQ  Episode duration:  Radiation: yes at times to right upper chest Frequency: intermittent -- not every day, not at night, notices about 5 days a week -- lasts a few minutes Alleviating factors: nothing Aggravating factors: unknown Status: stable Treatments attempted: none Fever: no Nausea: no Vomiting: no Weight loss: no Decreased appetite: yes Diarrhea: no Constipation: yes  Blood in stool: no Heartburn: no Jaundice: no Rash: no Dysuria/urinary frequency: no Hematuria: no History of sexually transmitted disease: no Recurrent NSAID use: no   Depression Screen done today and results listed below:  Depression screen [facility] 2/9 02/07/2021 07/12/2020 12/25/2019 06/24/2019 05/05/2018  Decreased Interest 0 0 0 0 0  Down, Depressed, Hopeless 0 0 0 0 0  PHQ - 2 Score 0 0 0 0 0  Altered sleeping - 0 3 - 1  Tired, decreased energy - 0 1 - 1  Change in appetite - 0 3 - 2  Feeling bad or failure about yourself  - 0 0 - 0  Trouble concentrating - 0 0 - 0  Moving slowly or fidgety/restless - 0 0 - 0  Suicidal thoughts - 0 0 - 0  PHQ-9 Score - 0 7 - 4  Difficult doing work/chores - - Not difficult at all - Not  difficult at all    The patient does not have a history of falls. I did not complete a risk assessment for falls. A plan of care for falls was not documented.   Past Medical History:  Past Medical History:  Diagnosis Date   Chronic kidney disease 04/25/2015   Diabetes mellitus with renal manifestation (HCC)    Diabetes mellitus without complication (HCC)    Glaucoma    Gout    Hyperlipidemia    Hypertension    Hypertensive CKD (chronic kidney disease)    Neuropathy    feet   Obesity    Sleep apnea    CPAP    Surgical History:  Past Surgical History:  Procedure Laterality Date   ABDOMINAL HYSTERECTOMY     BREAST CYST ASPIRATION     CHOLECYSTECTOMY     COLONOSCOPY WITH PROPOFOL N/A 02/01/2020   Procedure: COLONOSCOPY WITH PROPOFOL;  Surgeon: [name], MD;  Location: [place];  Service: Endoscopy;  Laterality: N/A;  Diabetic - oral meds sleep apnea priority 4   HERNIA REPAIR     THUMB FUSION      Medications:  Current Outpatient Medications on File Prior to Visit  Medication Sig   allopurinol (ZYLOPRIM) 300 MG tablet Take 1 tablet (300 mg total) by mouth daily.   aspirin 81 MG EC tablet Take 81 mg by mouth daily.    Cholecalciferol 25 MCG (1000 UT) tablet Take 2,000 Units by mouth daily.    DHA-EPA-Flaxseed Oil-Vitamin E (THERA TEARS NUTRITION PO) Take 1 mg by mouth 2 (two) times daily.    gabapentin (NEURONTIN) 300 MG capsule TAKE 1 CAPSULE EVERY MORNING AND TAKE 3 CAPSULES EVERY NIGHT BEFORE BEDTIME   gemfibrozil (LOPID) 600 MG tablet Take 1 tablet (600 mg total) by mouth 2 (two) times daily.   latanoprost (XALATAN) 0.005 % ophthalmic solution 1 drop at bedtime.   metoprolol succinate (TOPROL-XL) 25 MG 24 hr tablet Take 1 tablet (25 mg total) by mouth daily.   Multiple Vitamins-Minerals (EYE VITAMINS PO) Take 1 tablet by mouth daily.   valsartan-hydrochlorothiazide (DIOVAN-HCT) 160-12.5 MG tablet Take 1 tablet by mouth daily.   vitamin B-12  (CYANOCOBALAMIN) 1000 MCG tablet Take 1,000 mcg by mouth daily.   XIIDRA 5 % SOLN Place 1 drop into both eyes 2 (two) times daily.    No current facility-administered medications on file prior to visit.    Allergies:  Allergies  Allergen Reactions   Niacin Itching   Sulfa Antibiotics Other (See Comments)    Fever    Social History:  Social  History   Socioeconomic History   Marital status: Divorced    Spouse name: Not on file   Number of children: Not on file   Years of education: Not on file   Highest education level: Associate degree: academic program  Occupational History   Occupation: retired  Tobacco Use   Smoking status: Never   Smokeless tobacco: Never  Vaping Use   Vaping Use: Never used  Substance and Sexual Activity   Alcohol use: No    Alcohol/week: 0.0 standard drinks   Drug use: No   Sexual activity: Not Currently  Other Topics Concern   Not on file  Social History Narrative   Not on file   Social Determinants of Health   Financial Resource Strain: Not on file  Food Insecurity: Not on file  Transportation Needs: Not on file  Physical Activity: Not on file  Stress: Not on file  Social Connections: Not on file  Intimate Partner Violence: Not on file   Social History   Tobacco Use  Smoking Status Never  Smokeless Tobacco Never   Social History   Substance and Sexual Activity  Alcohol Use No   Alcohol/week: 0.0 standard drinks    Family History:  Family History  Problem Relation Age of Onset   Diabetes Mother    Cancer Mother        Pituitary tumor   Hyperlipidemia Mother    Hypertension Mother    Thyroid disease Mother    Stroke Mother    Cancer Father        Lung   Cancer Brother        Oral   Hypertension Son    Cancer Maternal Grandmother        colon   Blindness Maternal Grandfather    Arthritis Paternal Grandmother    Arthritis Daughter        RA   Breast cancer Neg Hx     Past medical history, surgical history,  medications, allergies, family history and social history reviewed with patient today and changes made to appropriate areas of the chart.   Review of Systems - negative All other ROS negative except what is listed above and in the HPI.      Objective:    BP 119/69   Pulse 92   Temp 98.6 F (37 C) (Oral)   Ht '5\' 5"'$  (1.651 m)   Wt 209 lb 12.8 oz (95.2 kg)   LMP  (LMP Unknown)   SpO2 96%   BMI 34.91 kg/m   Wt Readings from Last 3 Encounters:  02/07/21 209 lb 12.8 oz (95.2 kg)  10/10/20 218 lb (98.9 kg)  07/12/20 218 lb 12.8 oz (99.2 kg)    Physical Exam Constitutional:      General: She is awake. She is not in acute distress.    Appearance: She is well-developed. She is not ill-appearing.  HENT:     Head: Normocephalic and atraumatic.     Right Ear: Hearing, tympanic membrane, ear canal and external ear normal. No drainage.     Left Ear: Hearing, tympanic membrane, ear canal and external ear normal. No drainage.     Nose: Nose normal.     Right Sinus: No maxillary sinus tenderness or frontal sinus tenderness.     Left Sinus: No maxillary sinus tenderness or frontal sinus tenderness.     Mouth/Throat:     Mouth: Mucous membranes are moist.     Pharynx: Oropharynx is clear. Uvula midline. No pharyngeal  swelling, oropharyngeal exudate or posterior oropharyngeal erythema.  Eyes:     General: Lids are normal.        Right eye: No discharge.        Left eye: No discharge.     Extraocular Movements: Extraocular movements intact.     Conjunctiva/sclera: Conjunctivae normal.     Pupils: Pupils are equal, round, and reactive to light.     Visual Fields: Right eye visual fields normal and left eye visual fields normal.  Neck:     Thyroid: No thyromegaly.     Vascular: No carotid bruit.     Trachea: Trachea normal.  Cardiovascular:     Rate and Rhythm: Normal rate and regular rhythm.     Heart sounds: Normal heart sounds. No murmur heard.   No gallop.  Pulmonary:     Effort:  Pulmonary effort is normal. No accessory muscle usage or respiratory distress.     Breath sounds: Normal breath sounds.  Chest:     Comments: Deferred per patient request. Abdominal:     General: Bowel sounds are normal. There is no distension.     Palpations: Abdomen is soft. There is no hepatomegaly.     Tenderness: There is no abdominal tenderness. There is no right CVA tenderness or left CVA tenderness.    Musculoskeletal:        General: Normal range of motion.     Cervical back: Normal range of motion and neck supple.     Right lower leg: No edema.     Left lower leg: No edema.  Lymphadenopathy:     Head:     Right side of head: No submental, submandibular, tonsillar, preauricular or posterior auricular adenopathy.     Left side of head: No submental, submandibular, tonsillar, preauricular or posterior auricular adenopathy.     Cervical: No cervical adenopathy.  Skin:    General: Skin is warm and dry.     Capillary Refill: Capillary refill takes less than 2 seconds.     Findings: No rash.  Neurological:     Mental Status: She is alert and oriented to person, place, and time.     Cranial Nerves: Cranial nerves are intact.     Gait: Gait is intact.     Deep Tendon Reflexes: Reflexes are normal and symmetric.     Reflex Scores:      Brachioradialis reflexes are 2+ on the right side and 2+ on the left side.      Patellar reflexes are 2+ on the right side and 2+ on the left side. Psychiatric:        Attention and Perception: Attention normal.        Mood and Affect: Mood normal.        Speech: Speech normal.        Behavior: Behavior normal. Behavior is cooperative.        Thought Content: Thought content normal.        Judgment: Judgment normal.   Results for orders placed or performed in visit on 02/07/21  Bayer DCA Hb A1c Waived  Result Value Ref Range   HB A1C (BAYER DCA - WAIVED) 6.6 <7.0 %      Assessment & Plan:   Problem List Items Addressed This Visit        Cardiovascular and Mediastinum   Hypertension associated with diabetes ([place])    Chronic, stable with BP below goal on home readings and at goal in office. Continue current medication  regimen and adjust as needed based on BP readings + focus on DASH diet.  CMP, CBC, and TSH next visit. Could consider discontinuation of HCTZ at upcoming visit, as BP well controlled and history of gout.  Return in 3 months.      Relevant Medications   metFORMIN (GLUCOPHAGE-XR) 500 MG 24 hr tablet   Other Relevant Orders   Bayer DCA Hb A1c Waived (Completed)   CBC with Differential/Platelet   Comprehensive metabolic panel   TSH   Aortic atherosclerosis ([place])    Noted on CT 09/18/19.  Continue to monitor and continue ASA + Lopid for prevention.        Respiratory   Obstructive sleep apnea    Continue nightly use, praised for this.        Endocrine   Hyperlipidemia associated with type 2 diabetes mellitus (HCC)    Chronic, stable.  Continue current medication regimen and adjust as needed.  Lipid panel today.  Recent LDL <70.      Relevant Medications   metFORMIN (GLUCOPHAGE-XR) 500 MG 24 hr tablet   Other Relevant Orders   Bayer DCA Hb A1c Waived (Completed)   Lipid Panel w/o Chol/HDL Ratio   Type 2 diabetes mellitus with diabetic mononeuropathy, without long-term current use of insulin (HCC) - Primary    Chronic, ongoing.  A1C 6.6% today, staying stable, praised for this and urine ALB 10 last visit.  Will change Metformin to XR 1000 MG BID to assist with bowels and adjust regimen as needed.  Focus on diabetic diet at home and regular exercise.  Continue Gabapentin daily for neuropathy, continue 900 MG at night and 300 MG in morning.  Current CrCl = 130 based on recent labs.  Will recheck B12 level up to date, continue supplement -- may need to consider injections monthly if remains low.  Recommend she monitor feet closely for any wounds or skin breakdown, alert provider immediately if present.  Return  in 3 months.      Relevant Medications   metFORMIN (GLUCOPHAGE-XR) 500 MG 24 hr tablet   Other Relevant Orders   Bayer DCA Hb A1c Waived (Completed)     Musculoskeletal and Integument   Osteopenia    Chronic, ongoing.  Noted on DEXA.  Continue Vitamin D supplement and check level today.  DEXA in 2026 next.      Relevant Orders   VITAMIN D 25 Hydroxy (Vit-D Deficiency, Fractures)     Other   Morbid obesity (HCC)    BMI 34.91, has lost 9 pounds since recent visit.  Recommended eating smaller high protein, low fat meals more frequently and exercising 30 mins a day 5 times a week with a goal of 10-15lb weight loss in the next 3 months. Patient voiced their understanding and motivation to adhere to these recommendations.       Relevant Medications   metFORMIN (GLUCOPHAGE-XR) 500 MG 24 hr tablet   Vitamin D deficiency    Chronic, stable.  Continue daily supplement and check Vit D level today.  DEXA in 2026 next.      Relevant Orders   VITAMIN D 25 Hydroxy (Vit-D Deficiency, Fractures)   Gout    Chronic, ongoing.  Continue Allopurinol at this time, but may consider reduction in future.  Uric acid ordered today.      Relevant Orders   Uric acid   B12 deficiency    Ongoing.  Continue daily supplement and level up to date.  She is on chronic Metformin,  which can cause lower B12 levels.  Educated her on this.  May consider monthly injections for a bit if level not improved with oral supplement.      Right upper quadrant abdominal pain    RUQ, noticed last year but improved, now has returned for a few months and has had 9 pounds weight loss since April + looser stool.  Will change Metformin to XR dosing.  Obtain CT abdomen and place GI referral.  Labs today.  Return in 4 weeks, sooner if worsening.      Relevant Orders   CT Abdomen Pelvis Wo Contrast   Ambulatory referral to Gastroenterology   Other Visit Diagnoses     Annual physical exam            Follow up plan: Return  in about 4 weeks (around 03/07/2021) for Abdominal pain.   LABORATORY TESTING:  - Pap smear: not applicable  IMMUNIZATIONS:   - Tdap: Tetanus vaccination status reviewed: last tetanus booster within 10 years. - Influenza: Up to date - Pneumovax: Up to date - Prevnar: Up to date - HPV: Not applicable - Zostavax vaccine: No Up To Date  SCREENING: -Mammogram: Up to date on 02/03/20 - Colonoscopy:  Up To Date - Bone Density: Up To Date on 02/03/20 -Hearing Test: not applicable -Spirometry: Not applicable   PATIENT COUNSELING:   Advised to take 1 mg of folate supplement per day if capable of pregnancy.   Sexuality: Discussed sexually transmitted diseases, partner selection, use of condoms, avoidance of unintended pregnancy  and contraceptive alternatives.   Advised to avoid cigarette smoking.  I discussed with the patient that most people either abstain from alcohol or drink within safe limits (<=14/week and <=4 drinks/occasion for males, <=7/weeks and <= 3 drinks/occasion for females) and that the risk for alcohol disorders and other health effects rises proportionally with the number of drinks per week and how often a drinker exceeds daily limits.  Discussed cessation/primary prevention of drug use and availability of treatment for abuse.   Diet: Encouraged to adjust caloric intake to maintain  or achieve ideal body weight, to reduce intake of dietary saturated fat and total fat, to limit sodium intake by avoiding high sodium foods and not adding table salt, and to maintain adequate dietary potassium and calcium preferably from fresh fruits, vegetables, and low-fat dairy products.    Stressed the importance of regular exercise  Injury prevention: Discussed safety belts, safety helmets, smoke detector, smoking near bedding or upholstery.   Dental health: Discussed importance of regular tooth brushing, flossing, and dental visits.    NEXT PREVENTATIVE PHYSICAL DUE IN 1 YEAR. Return  in about 4 weeks (around 03/07/2021) for Abdominal pain.

## 2021-02-07 NOTE — Assessment & Plan Note (Signed)
Chronic, stable.  Continue current medication regimen and adjust as needed.  Lipid panel today.  Recent LDL <70.

## 2021-02-07 NOTE — Assessment & Plan Note (Signed)
Chronic, ongoing.  A1C 6.6% today, staying stable, praised for this and urine ALB 10 last visit.  Will change Metformin to XR 1000 MG BID to assist with bowels and adjust regimen as needed.  Focus on diabetic diet at home and regular exercise.  Continue Gabapentin daily for neuropathy, continue 900 MG at night and 300 MG in morning.  Current CrCl = 130 based on recent labs.  Will recheck B12 level up to date, continue supplement -- may need to consider injections monthly if remains low.  Recommend she monitor feet closely for any wounds or skin breakdown, alert provider immediately if present.  Return in 3 months.

## 2021-02-07 NOTE — Assessment & Plan Note (Addendum)
Chronic, stable.  Continue daily supplement and check Vit D level today.  DEXA in 2026 next. 

## 2021-02-07 NOTE — Assessment & Plan Note (Addendum)
Ongoing.  Continue daily supplement and level up to date.  She is on chronic Metformin, which can cause lower B12 levels.  Educated her on this.  May consider monthly injections for a bit if level not improved with oral supplement.

## 2021-02-07 NOTE — Assessment & Plan Note (Signed)
Noted on CT 09/18/19.  Continue to monitor and continue ASA + Lopid for prevention.

## 2021-02-07 NOTE — Assessment & Plan Note (Signed)
Chronic, ongoing.  Noted on DEXA.  Continue Vitamin D supplement and check level today.  DEXA in 2026 next. 

## 2021-02-07 NOTE — Assessment & Plan Note (Signed)
RUQ, noticed last year but improved, now has returned for a few months and has had 9 pounds weight loss since April + looser stool.  Will change Metformin to XR dosing.  Obtain CT abdomen and place GI referral.  Labs today.  Return in 4 weeks, sooner if worsening.

## 2021-02-07 NOTE — Assessment & Plan Note (Signed)
Continue nightly use, praised for this.

## 2021-02-07 NOTE — Assessment & Plan Note (Signed)
Chronic, ongoing.  Continue Allopurinol at this time, but may consider reduction in future.  Uric acid ordered today. 

## 2021-02-07 NOTE — Assessment & Plan Note (Signed)
BMI 34.91, has lost 9 pounds since recent visit.  Recommended eating smaller high protein, low fat meals more frequently and exercising 30 mins a day 5 times a week with a goal of 10-15lb weight loss in the next 3 months. Patient voiced their understanding and motivation to adhere to these recommendations.

## 2021-02-07 NOTE — Assessment & Plan Note (Signed)
Chronic, stable with BP below goal on home readings and at goal in office. Continue current medication regimen and adjust as needed based on BP readings + focus on DASH diet.  CMP, CBC, and TSH next visit. Could consider discontinuation of HCTZ at upcoming visit, as BP well controlled and history of gout.  Return in 3 months.

## 2021-02-08 LAB — CBC WITH DIFFERENTIAL/PLATELET
Basophils Absolute: 0 10*3/uL (ref 0.0–0.2)
Basos: 1 %
EOS (ABSOLUTE): 0.1 10*3/uL (ref 0.0–0.4)
Eos: 2 %
Hematocrit: 40.5 % (ref 34.0–46.6)
Hemoglobin: 13.3 g/dL (ref 11.1–15.9)
Immature Grans (Abs): 0 10*3/uL (ref 0.0–0.1)
Immature Granulocytes: 0 %
Lymphocytes Absolute: 2.1 10*3/uL (ref 0.7–3.1)
Lymphs: 38 %
MCH: 28 pg (ref 26.6–33.0)
MCHC: 32.8 g/dL (ref 31.5–35.7)
MCV: 85 fL (ref 79–97)
Monocytes Absolute: 0.4 10*3/uL (ref 0.1–0.9)
Monocytes: 7 %
Neutrophils Absolute: 3 10*3/uL (ref 1.4–7.0)
Neutrophils: 52 %
Platelets: 338 10*3/uL (ref 150–450)
RBC: 4.75 x10E6/uL (ref 3.77–5.28)
RDW: 13.4 % (ref 11.7–15.4)
WBC: 5.6 10*3/uL (ref 3.4–10.8)

## 2021-02-08 LAB — URIC ACID: Uric Acid: 5.6 mg/dL (ref 3.1–7.9)

## 2021-02-08 LAB — VITAMIN D 25 HYDROXY (VIT D DEFICIENCY, FRACTURES): Vit D, 25-Hydroxy: 46.3 ng/mL (ref 30.0–100.0)

## 2021-02-08 LAB — LIPID PANEL W/O CHOL/HDL RATIO
Cholesterol, Total: 150 mg/dL (ref 100–199)
HDL: 35 mg/dL — ABNORMAL LOW (ref >39)
LDL Chol Calc (NIH): 81 mg/dL (ref 0–99)
Triglycerides: 204 mg/dL — ABNORMAL HIGH (ref 0–149)
VLDL Cholesterol Cal: 34 mg/dL (ref 5–40)

## 2021-02-08 LAB — COMPREHENSIVE METABOLIC PANEL
ALT: 17 IU/L (ref 0–32)
AST: 19 IU/L (ref 0–40)
Albumin/Globulin Ratio: 2.3 — ABNORMAL HIGH (ref 1.2–2.2)
Albumin: 4.8 g/dL — ABNORMAL HIGH (ref 3.7–4.7)
Alkaline Phosphatase: 48 IU/L (ref 44–121)
BUN/Creatinine Ratio: 19 (ref 12–28)
BUN: 12 mg/dL (ref 8–27)
Bilirubin Total: 0.7 mg/dL (ref 0.0–1.2)
CO2: 23 mmol/L (ref 20–29)
Calcium: 9.7 mg/dL (ref 8.7–10.3)
Chloride: 97 mmol/L (ref 96–106)
Creatinine, Ser: 0.64 mg/dL (ref 0.57–1.00)
Globulin, Total: 2.1 g/dL (ref 1.5–4.5)
Glucose: 174 mg/dL — ABNORMAL HIGH (ref 65–99)
Potassium: 3.7 mmol/L (ref 3.5–5.2)
Sodium: 140 mmol/L (ref 134–144)
Total Protein: 6.9 g/dL (ref 6.0–8.5)
eGFR: 93 mL/min/{1.73_m2} (ref >59)

## 2021-02-08 LAB — TSH: TSH: 0.207 u[IU]/mL — ABNORMAL LOW (ref 0.450–4.500)

## 2021-02-08 NOTE — Progress Notes (Signed)
Contacted via [place], your labs have returned: - Kidney function, creatinine and eGFR, is normal.  Liver function, AST and ALT, is normal.  - CBC shows no anemia - Cholesterol levels remain stable - Vitamin D and uric acid are normal - Thyroid lab, TSH, is on low side again -- more hyperthyroid.  This may be why you have some of this stomach discomfort if you thyroid is not working correctly.  I want to recheck this on 03/13/21 at your visit with me and we may even consider a thyroid ultrasound in future.  Any questions? Keep being amazing!!  Thank you for allowing me to participate in your care.  I appreciate you. Kindest regards, Tamim Skog

## 2021-02-13 ENCOUNTER — Telehealth: Payer: Self-pay

## 2021-02-14 ENCOUNTER — Ambulatory Visit (INDEPENDENT_AMBULATORY_CARE_PROVIDER_SITE_OTHER): Payer: Medicare Other

## 2021-02-14 DIAGNOSIS — E1169 Type 2 diabetes mellitus with other specified complication: Secondary | ICD-10-CM

## 2021-02-14 DIAGNOSIS — I152 Hypertension secondary to endocrine disorders: Secondary | ICD-10-CM

## 2021-02-14 DIAGNOSIS — E785 Hyperlipidemia, unspecified: Secondary | ICD-10-CM | POA: Diagnosis not present

## 2021-02-14 DIAGNOSIS — E1159 Type 2 diabetes mellitus with other circulatory complications: Secondary | ICD-10-CM

## 2021-02-14 DIAGNOSIS — E1141 Type 2 diabetes mellitus with diabetic mononeuropathy: Secondary | ICD-10-CM

## 2021-02-14 NOTE — Patient Instructions (Signed)
Catherine Hinton,  Thank you for talking with me today. I have included our care plan/goals in the following pages.   Please review and call me at [phone] with any questions.  Thanks! Catherine Hinton, PharmD, CPP Clinical Pharmacist Practitioner  [phone]  Care Plan : [place]  Updates made by Madelin Rear, [facility] since 02/14/2021 12:00 AM     Problem: DM, HTN, HLD, Osteopenia, gout   Priority: High     Long-Range Goal: Disease Management   Start Date: 10/12/2020  This Visit's Progress: On track  Recent Progress: On track  Priority: High  Note:    Current Barriers:  Unable to achieve control of Hyperlipidemia   Pharmacist Clinical Goal(s):  Patient will verbalize ability to afford treatment regimen contact provider office for questions/concerns as evidenced notation of same in electronic health record through collaboration with PharmD and provider.   Interventions: 1:1 collaboration with [name], NP regarding development and update of comprehensive plan of care as evidenced by provider attestation and co-signature Inter-disciplinary care team collaboration (see longitudinal plan of care) Comprehensive medication review performed; medication list updated in electronic medical record  Hypertension (BP goal <130/80) -Controlled -Current treatment: Valsartan-hctz 160-12.5 mg once daily Metoprolol succinate 25 mg daily -Current home readings: [id number] systolic.  -Denies hypotensive/hypertensive symptoms -Educated on BP goals and benefits of medications for prevention of heart attack, stroke and kidney damage; Daily salt intake goal < 2300 mg; -Counseled to monitor BP at home 1-2x/week, document, and provide log at future appointments -Recommended to continue current medication  Hyperlipidemia: (LDL goal < 100) -Uncontrolled -DM+HTN, age 75+, HTN, HDL <50 high ASCVD per REDUCE-IT/ASCVD 10 yr risk >20%  -Current treatment: Gemfibrozil  600 mg twice daily  -also taking DHA-EPA-Flaxseed Oil-Vitamin E (THERA TEARS NUTRITION PO) OTC -Medications previously tried: n/a  -Reviewed for side effects: dyspepsia noted in 20% of patients using gemfibrozil. RUQ pain is being evaluated by GI. Hx of gall bladder removal  -Educated on limiting simple/added sugars and alcohol abstinence  -Consider switch to moderate-high intensity statin and follow-up labs to determine need for Rx fish oil following completion of GI evaluation due to     Diabetes (A1c goal <7%) -Controlled -Current medications: Metformin XR 500 mg - two tabs BID (switched from IR to XR 01/2021) -Medications previously tried: Metformin IR (GI pain) -Current home glucose readings fasting glucose: 120s-140s -Denies hypoglycemic/hyperglycemic symptoms -Current meal patterns: vegetables from garden, cucumbers, squash. Appetite has been decreased due to GI pain.  -Current exercise: limited due to recent GI pain - has f/u scheduled for this issue -Educated on A1c and blood sugar goals; Benefits of routine self-monitoring of blood sugar; -Recommended to continue current medication  Patient Goals/Self-Care Activities Patient will:  - take medications as prescribed target a minimum of 150 minutes of moderate intensity exercise weekly  Follow Up Plan: f/u rph 3 months  Medication Assistance: None required.  Patient affirms current coverage meets needs.  Patient's preferred pharmacy is:  [facility] ([facility] - [place], [place] [address] Phone: [phone] Fax: [phone]    The patient verbalized understanding of instructions provided today and agreed to receive a MyChart copy of patient instruction and/or educational materials. Telephone follow up appointment with pharmacy team member scheduled for: See next appointment with "Care Management Staff" under "What's Next"  below.

## 2021-02-14 NOTE — Progress Notes (Signed)
Chronic Care Management Pharmacy Note  02/14/2021 Name:  Catherine Hinton MRN:  [id number] DOB:  09/24/1945  Recommendations/Changes made from today's visit: Consider switch to moderate-high intensity statin and follow-up labs to determine need for Rx fish oil following completion of GI evaluation Could also consider holding gemfibrozil due to potential for dyspepsia ~20%   Subjective: Catherine Hinton is an 75 y.o. year old female who is a primary patient of Cannady, Barbaraann Faster, NP.  The CCM team was consulted for assistance with disease management and care coordination needs.    Engaged with patient by telephone for follow up visit in response to provider referral for pharmacy case management and/or care coordination services.   Consent to Services:  The patient was given information about Chronic Care Management services, agreed to services, and gave verbal consent prior to initiation of services.  Please see initial visit note for detailed documentation.   Patient Care Team: [name], NP as PCP - General (Nurse Practitioner) Anell Barr, [place] (Optometry) Vladimir Faster, [facility] (Pharmacist)  Hospital visits: None in previous 6 months  Objective:  Lab Results  Component Value Date   CREATININE 0.64 02/07/2021   CREATININE 0.61 07/12/2020   CREATININE 0.59 04/08/2020    Lab Results  Component Value Date   HGBA1C 6.6 02/07/2021   Last diabetic Eye exam:  Lab Results  Component Value Date/Time   HMDIABEYEEXA No Retinopathy 05/25/2020 12:00 AM    Last diabetic Foot exam:  Lab Results  Component Value Date/Time   HMDIABFOOTEX PP 11/02/2014 12:00 AM        Component Value Date/Time   CHOL 150 02/07/2021 0903   CHOL 225 (H) 12/09/2018 0820   TRIG 204 (H) 02/07/2021 0903   TRIG 115 12/09/2018 0820   HDL 35 (L) 02/07/2021 0903   VLDL 23 12/09/2018 0820   LDLCALC 81 02/07/2021 0903    Hepatic Function Latest Ref Rng & Units 02/07/2021 10/02/2019 09/18/2019   Total Protein 6.0 - 8.5 g/dL 6.9 7.1 7.1  Albumin 3.7 - 4.7 g/dL 4.8(H) 4.7 4.8(H)  AST 0 - 40 IU/L _0 ALT 0 - 32 IU/L _1 Alk Phosphatase 44 - 121 IU/L 48 46 48  Total Bilirubin 0.0 - 1.2 mg/dL 0.7 0.4 0.4    Lab Results  Component Value Date/Time   TSH 0.207 (L) 02/07/2021 09:03 AM   TSH 0.486 07/12/2020 09:47 AM    CBC Latest Ref Rng & Units 02/07/2021 10/02/2019 09/18/2019  WBC 3.4 - 10.8 x10E3/uL 5.6 5.4 6.7  Hemoglobin 11.1 - 15.9 g/dL 13.3 13.3 13.8  Hematocrit 34.0 - 46.6 % 40.5 39.6 39.4  Platelets 150 - 450 x10E3/uL 338 319 291    Lab Results  Component Value Date/Time   VD25OH 46.3 02/07/2021 09:03 AM   VD25OH 41.5 07/12/2020 09:47 AM    Clinical ASCVD: No  The 10-year ASCVD risk score Mikey Bussing DC Jr., et al., 2013) is: 26.6%   Values used to calculate the score:     Age: 65 years     Sex: Female     Is Non-Hispanic African American: No     Diabetic: Yes     Tobacco smoker: No     Systolic Blood Pressure: 481 mmHg     Is BP treated: Yes     HDL Cholesterol: 35 mg/dL     Total Cholesterol: 150 mg/dL     Social History   Tobacco Use  Smoking Status Never  Smokeless Tobacco Never   BP Readings from Last 3 Encounters:  02/07/21 119/69  10/10/20 116/78  07/12/20 130/80   Pulse Readings from Last 3 Encounters:  02/07/21 92  10/10/20 99  07/12/20 88   Wt Readings from Last 3 Encounters:  02/07/21 209 lb 12.8 oz (95.2 kg)  10/10/20 218 lb (98.9 kg)  07/12/20 218 lb 12.8 oz (99.2 kg)    Assessment: Review of patient past medical history, allergies, medications, health status, including review of consultants reports, laboratory and other test data, was performed as part of comprehensive evaluation and provision of chronic care management services.   SDOH:  (Social Determinants of Health) assessments and interventions performed:    CCM Care Plan  Allergies  Allergen Reactions   Niacin Itching   Sulfa Antibiotics Other (See Comments)     Fever    Medications Reviewed Today     Reviewed by Madelin Rear, [facility] (Pharmacist) on 02/14/21 at 1045  Med List Status: <None>   Medication Order Taking? Sig Documenting Provider Last Dose Status Informant  allopurinol (ZYLOPRIM) 300 MG tablet [id number]  Take 1 tablet (300 mg total) by mouth daily. Marnee Guarneri T, NP  Active   aspirin 81 MG EC tablet [id number]  Take 81 mg by mouth daily.  [provider]  Active   Cholecalciferol 25 MCG (1000 UT) tablet [id number]  Take 2,000 Units by mouth daily.  [provider]  Active            Med Note Inis Sizer   Wed Dec 01, 2014 10:36 AM) Received from: [facility]: Take by mouth.  DHA-EPA-Flaxseed Oil-Vitamin E (THERA TEARS NUTRITION PO) [id number]  Take 1 mg by mouth 2 (two) times daily.  [provider]  Active   gabapentin (NEURONTIN) 300 MG capsule [id number]  TAKE 1 CAPSULE EVERY MORNING AND TAKE 3 CAPSULES EVERY NIGHT BEFORE BEDTIME Cannady, [name] T, NP  Active   gemfibrozil (LOPID) 600 MG tablet [id number] Yes Take 1 tablet (600 mg total) by mouth 2 (two) times daily. Marnee Guarneri T, NP  Active   latanoprost (XALATAN) 0.005 % ophthalmic solution [id number]  1 drop at bedtime. [provider]  Active   metFORMIN (GLUCOPHAGE-XR) 500 MG 24 hr tablet [id number] Yes Take 2 tablets (1,000 mg total) by mouth in the morning and at bedtime. Marnee Guarneri T, NP  Active   metoprolol succinate (TOPROL-XL) 25 MG 24 hr tablet [id number] Yes Take 1 tablet (25 mg total) by mouth daily. Marnee Guarneri T, NP  Active   Multiple Vitamins-Minerals (EYE VITAMINS PO) [id number]  Take 1 tablet by mouth daily. [provider]  Active   valsartan-hydrochlorothiazide (DIOVAN-HCT) 160-12.5 MG tablet [id number] Yes Take 1 tablet by mouth daily. Marnee Guarneri T, NP  Active   vitamin B-12 (CYANOCOBALAMIN) 1000 MCG tablet [id number]  Take 1,000 mcg by mouth daily. [provider]   Active   XIIDRA 5 % SOLN [id number]  Place 1 drop into both eyes 2 (two) times daily.  [provider]  Active Self           Med Note Shelia Media, Ricky Ala Apr 25, 2015  9:45 AM) Received from: External Pharmacy  Zoster Vaccine Adjuvanted [facility]) injection [id number]  Inject 0.5 mLs into the muscle once for 1 dose. Dose # 2 [name], NP  Active             Patient Active Problem List  Diagnosis Date Noted   Right upper quadrant abdominal pain 02/07/2021   Aortic atherosclerosis ([place]) 02/04/2021   Osteopenia 04/08/2020   B12 deficiency 10/04/2019   Gout 06/24/2019   Vitamin D deficiency 05/05/2018   Type 2 diabetes mellitus with diabetic mononeuropathy, without long-term current use of insulin ([place]) 11/06/2017   Hypertension associated with diabetes ([place]) 10/26/2015   Dermatochalasis of both upper eyelids 07/21/2015   Squamous blepharitis of upper and lower eyelids of both eyes 07/21/2015   Obstructive sleep apnea 04/22/2015   Hyperlipidemia associated with type 2 diabetes mellitus ([place]) 04/22/2015   Morbid obesity ([place]) 04/22/2015   Insomnia 04/22/2015    Immunization History  Administered Date(s) Administered   Fluad Quad(high Dose 65+) 03/24/2019, 04/08/2020   Influenza, High Dose Seasonal PF 04/25/2016, 04/04/2017, 02/18/2018   Influenza,inj,Quad PF,6+ Mos 04/25/2015   Influenza-Unspecified 04/18/2012   PFIZER(Purple Top)SARS-COV-2 Vaccination 07/29/2019, 08/19/2019, 03/20/2020, 12/03/2020   Pneumococcal Conjugate-13 04/20/2014   Pneumococcal Polysaccharide-23 10/26/2015   Pneumococcal-Unspecified 06/18/2006   Tdap 12/12/2010   Zoster, Live 09/21/2013    Conditions to be addressed/monitored: HTN, HLD, Hypertriglyceridemia, and DMII  Care Plan : [place]  Updates made by Madelin Rear, [facility] since 02/14/2021 12:00 AM     Problem: DM, HTN, HLD, Osteopenia, gout   Priority: High     Long-Range Goal: Disease Management   Start  Date: 10/12/2020  Recent Progress: On track  Priority: High  Note:    Current Barriers:  Unable to achieve control of Hyperlipidemia   Pharmacist Clinical Goal(s):  Patient will verbalize ability to afford treatment regimen contact provider office for questions/concerns as evidenced notation of same in electronic health record through collaboration with PharmD and provider.   Interventions: 1:1 collaboration with [name], NP regarding development and update of comprehensive plan of care as evidenced by provider attestation and co-signature Inter-disciplinary care team collaboration (see longitudinal plan of care) Comprehensive medication review performed; medication list updated in electronic medical record  Hypertension (BP goal <130/80) -Controlled -Current treatment: Valsartan-hctz 160-12.5 mg once daily Metoprolol succinate 25 mg daily -Current home readings: 817R-116F systolic.  -Denies hypotensive/hypertensive symptoms -Educated on BP goals and benefits of medications for prevention of heart attack, stroke and kidney damage; Daily salt intake goal < 2300 mg; -Counseled to monitor BP at home 1-2x/week, document, and provide log at future appointments -Recommended to continue current medication  Hyperlipidemia: (LDL goal < 100) -Uncontrolled -DM+HTN, age 41+, HTN, HDL <50 high ASCVD per REDUCE-IT/ASCVD 10 yr risk >20%  -Current treatment: Gemfibrozil 600 mg twice daily  -also taking DHA-EPA-Flaxseed Oil-Vitamin E (THERA TEARS NUTRITION PO) OTC -Medications previously tried: n/a  -Reviewed for side effects: dyspepsia noted in 20% of patients using gemfibrozil. RUQ pain is being evaluated by GI. Hx of gall bladder removal  -Educated on limiting simple/added sugars and alcohol abstinence  -Consider switch to moderate-high intensity statin and follow-up labs to determine need for Rx fish oil following completion of GI evaluation due to     Diabetes (A1c goal  <7%) -Controlled -Current medications: Metformin XR 500 mg - two tabs BID (switched from IR to XR 01/2021) -Medications previously tried: Metformin IR (GI pain) -Current home glucose readings fasting glucose: 120s-140s -Denies hypoglycemic/hyperglycemic symptoms -Current meal patterns: vegetables from garden, cucumbers, squash. Appetite has been decreased due to GI pain.  -Current exercise: limited due to recent GI pain - has f/u scheduled for this issue -Educated on A1c and blood sugar goals; Benefits of routine self-monitoring of blood sugar; -  Recommended to continue current medication  Patient Goals/Self-Care Activities Patient will:  - take medications as prescribed target a minimum of 150 minutes of moderate intensity exercise weekly  Follow Up Plan: f/u rph 3 months  Medication Assistance: None required.  Patient affirms current coverage meets needs.  Patient's preferred pharmacy is:  [facility] ([facility]) - [place], [place] [address] Phone: [phone] Fax: [phone]     Future Appointments  Date Time Provider [place]  02/16/2021 11:00 AM MCM-CT MCM-CT [facility]  03/02/2021  1:30 PM Jonathon Bellows, MD AGI-AGIB None  03/13/2021 11:00 AM [name], NP CFP-CFP PEC  05/01/2021  1:00 PM CFP CCM PHARMACY CFP-CFP PEC   Madelin Rear, PharmD, CPP Clinical Pharmacist Practitioner  [phone]

## 2021-02-16 ENCOUNTER — Ambulatory Visit
Admission: RE | Admit: 2021-02-16 | Discharge: 2021-02-16 | Disposition: A | Discharge status: Home / Self Care | Payer: Medicare Other | Source: Ambulatory Visit | Attending: Nurse Practitioner | Admitting: Nurse Practitioner

## 2021-02-16 ENCOUNTER — Other Ambulatory Visit: Payer: Self-pay

## 2021-02-16 DIAGNOSIS — R1011 Right upper quadrant pain: Secondary | ICD-10-CM | POA: Insufficient documentation

## 2021-02-16 DIAGNOSIS — N200 Calculus of kidney: Secondary | ICD-10-CM | POA: Diagnosis not present

## 2021-02-16 DIAGNOSIS — K760 Fatty (change of) liver, not elsewhere classified: Secondary | ICD-10-CM | POA: Diagnosis not present

## 2021-02-16 DIAGNOSIS — Z9049 Acquired absence of other specified parts of digestive tract: Secondary | ICD-10-CM | POA: Diagnosis not present

## 2021-02-16 DIAGNOSIS — K5730 Diverticulosis of large intestine without perforation or abscess without bleeding: Secondary | ICD-10-CM | POA: Diagnosis not present

## 2021-02-17 NOTE — Progress Notes (Signed)
Contacted via [place] afternoon [place], your CT imaging has returned.  Overall continues to be similar to past imaging.  You do have some ongoing hepatic steatosis, fatty liver, this is seen often due to current diet intake in society + with your underlying diabetes.  We will continue to monitor this and work on eating healthy, non processed foods at home.  No hernia was noted.  There continues to be a small kidney stone to right kidney, but this is not obstructing anything.  Any questions?  I see you are scheduled to see Dr. Vicente Males on 03/02/21 -- he is amazing!!  We will see if he has any recommendations as well on this.  Have a great day!! Keep being awesome!!  Thank you for allowing me to participate in your care.  I appreciate you. Kindest regards, Angeni Chaudhuri

## 2021-02-21 ENCOUNTER — Other Ambulatory Visit: Payer: Self-pay | Admitting: Nurse Practitioner

## 2021-02-21 DIAGNOSIS — Z1231 Encounter for screening mammogram for malignant neoplasm of breast: Secondary | ICD-10-CM

## 2021-03-02 ENCOUNTER — Ambulatory Visit: Payer: No Typology Code available for payment source | Admitting: Gastroenterology

## 2021-03-09 ENCOUNTER — Other Ambulatory Visit: Payer: Self-pay

## 2021-03-09 ENCOUNTER — Ambulatory Visit
Admission: RE | Admit: 2021-03-09 | Discharge: 2021-03-09 | Disposition: A | Discharge status: Home / Self Care | Payer: Medicare Other | Source: Ambulatory Visit | Attending: Nurse Practitioner | Admitting: Nurse Practitioner

## 2021-03-09 DIAGNOSIS — Z1231 Encounter for screening mammogram for malignant neoplasm of breast: Secondary | ICD-10-CM | POA: Diagnosis not present

## 2021-03-13 ENCOUNTER — Other Ambulatory Visit: Payer: Self-pay

## 2021-03-13 ENCOUNTER — Encounter: Payer: Self-pay | Admitting: Nurse Practitioner

## 2021-03-13 ENCOUNTER — Ambulatory Visit (INDEPENDENT_AMBULATORY_CARE_PROVIDER_SITE_OTHER): Payer: Medicare Other | Admitting: Nurse Practitioner

## 2021-03-13 VITALS — BP 96/66 | HR 88 | Temp 99.3°F | Wt 210.6 lb

## 2021-03-13 DIAGNOSIS — R1011 Right upper quadrant pain: Secondary | ICD-10-CM

## 2021-03-13 DIAGNOSIS — Z23 Encounter for immunization: Secondary | ICD-10-CM | POA: Diagnosis not present

## 2021-03-13 DIAGNOSIS — E1141 Type 2 diabetes mellitus with diabetic mononeuropathy: Secondary | ICD-10-CM

## 2021-03-13 DIAGNOSIS — E0590 Thyrotoxicosis, unspecified without thyrotoxic crisis or storm: Secondary | ICD-10-CM

## 2021-03-13 MED ORDER — GABAPENTIN 300 MG PO CAPS: ORAL_CAPSULE | ORAL | 4 refills | Status: DC

## 2021-03-13 NOTE — Assessment & Plan Note (Signed)
Chronic, ongoing.  A1C 6.6% last visit, staying stable, praised for this and urine ALB 10 last visit.  Will change Metformin to XR 1000 MG BID to assist with bowels and adjust regimen as needed.  Focus on diabetic diet at home and regular exercise.  Increase Gabapentin daily for neuropathy, continue 900 MG at HS and 300 MG in morning, but add on 300 MG at evening time.  Current CrCl = 76 based on recent labs.   Recommend she monitor feet closely for any wounds or skin breakdown, alert provider immediately if present.  Return in 3 months.

## 2021-03-13 NOTE — Assessment & Plan Note (Signed)
Overall reassuring labs recent visit, TSH slightly on low side, will recheck today.  Abdominal u/s reassuring with some hepatic steatosis, she is working on diet changes.  At this time pain she reports is improving with bowel regimen changes and diet.  Plan to return if worsening symptoms.

## 2021-03-13 NOTE — Progress Notes (Addendum)
BP 96/66   Pulse 88   Temp 99.3 F (37.4 C) (Oral)   Wt 210 lb 9.6 oz (95.5 kg)   LMP  (LMP Unknown)   SpO2 96%   BMI 35.05 kg/m    Subjective:    [name], female    DOB: [birth year], 75 y.o.   MRN: [id number]  HPI: Catherine Hinton is a 75 y.o. female  Chief Complaint  Patient presents with   Abdominal Pain    Patient states the abdominal pain has pretty much went away completely.    Peripheral Neuropathy    Patient states she would like to discuss Neuropathy pain management as some times in the evenings it is worse and she did not realized what the pain can do to you. Patient states the Gabapentin helps a little bit.    ABDOMINAL PAIN  Follow-up for right upper abdominal pain reported on 02/07/21, labs at time were overall stable and ultrasound noted small nonobstructive right renal calculus + hepatic steatosis.    Had been present off and on for one month, which she reports has resolved at this time -- states pain is not very often and not very much.  Does not have gall bladder -- this was removed 25 or more years ago.  History of total hysterectomy and two hernia surgeries.   Duration:months Onset: intermittent -- less frequency Severity: 2/10 Quality: dull and aching Location:  RUQ  Episode duration: seconds Alleviating factors: diet changes Aggravating factors: unknown Status: stable Treatments attempted: none Fever: no Nausea: no Vomiting: no Weight loss: no Decreased appetite: none Diarrhea: no Constipation: yes Blood in stool: no Heartburn: no Jaundice: no Rash: no Dysuria/urinary frequency: no Hematuria: no History of sexually transmitted disease: no Recurrent NSAID use: no   NEUROPATHY Does well in morning with her 300 MG Gabapentin, worst time is evening between 6-8 PM, so takes medications early and goes to bed -- 900 MG Gabapentin at night.  Once this kicks in she sleeps well. Neuropathy status: uncontrolled  Satisfied with  current treatment?: yes Medication side effects: no Medication compliance:  good compliance Location: feet and legs bilaterally Pain: yes Severity: 7/10  Quality:  shooting, tingling, and pins and needles Frequency: intermittent Bilateral: yes Symmetric: yes Numbness: yes Decreased sensation: no Weakness: no Context: stable Alleviating factors: Gabapentin Aggravating factors: diabetes Treatments attempted: Gabapentin  Relevant past medical, surgical, family and social history reviewed and updated as indicated. Interim medical history since our last visit reviewed. Allergies and medications reviewed and updated.  Review of Systems  Constitutional:  Negative for activity change, appetite change, diaphoresis, fatigue and fever.  Respiratory:  Negative for cough, chest tightness, shortness of breath and wheezing.   Cardiovascular:  Negative for chest pain, palpitations and leg swelling.  Gastrointestinal: Negative.   Endocrine: Negative for cold intolerance, heat intolerance, polydipsia, polyphagia and polyuria.  Genitourinary: Negative.   Neurological: Negative.   Psychiatric/Behavioral: Negative.     Per HPI unless specifically indicated above     Objective:    BP 96/66   Pulse 88   Temp 99.3 F (37.4 C) (Oral)   Wt 210 lb 9.6 oz (95.5 kg)   LMP  (LMP Unknown)   SpO2 96%   BMI 35.05 kg/m   Wt Readings from Last 3 Encounters:  03/13/21 210 lb 9.6 oz (95.5 kg)  02/07/21 209 lb 12.8 oz (95.2 kg)  10/10/20 218 lb (98.9 kg)    Physical Exam Vitals and nursing note reviewed.  Constitutional:      General: She is awake. She is not in acute distress.    Appearance: She is well-developed. She is obese. She is not ill-appearing.  HENT:     Head: Normocephalic.     Right Ear: Hearing, tympanic membrane and ear canal normal. No drainage.     Left Ear: Hearing, tympanic membrane, ear canal and external ear normal. No drainage.     Nose: Nose normal.     Mouth/Throat:      Mouth: Mucous membranes are moist.     Pharynx: Oropharynx is clear.  Eyes:     General: Lids are normal.        Right eye: No discharge.        Left eye: No discharge.     Conjunctiva/sclera: Conjunctivae normal.     Pupils: Pupils are equal, round, and reactive to light.  Neck:     Vascular: No carotid bruit.  Cardiovascular:     Rate and Rhythm: Normal rate and regular rhythm.     Heart sounds: Normal heart sounds. No murmur heard.   No gallop.  Pulmonary:     Effort: Pulmonary effort is normal. No accessory muscle usage or respiratory distress.     Breath sounds: Normal breath sounds.  Abdominal:     General: Bowel sounds are normal. There is no distension.     Palpations: Abdomen is soft. There is no hepatomegaly.     Tenderness: There is no abdominal tenderness. There is no right CVA tenderness or left CVA tenderness.  Musculoskeletal:     Cervical back: Normal range of motion and neck supple.     Right lower leg: No edema.     Left lower leg: No edema.  Lymphadenopathy:     Cervical: No cervical adenopathy.  Skin:    General: Skin is warm and dry.  Neurological:     Mental Status: She is alert and oriented to person, place, and time.  Psychiatric:        Attention and Perception: Attention normal.        Mood and Affect: Mood normal.        Behavior: Behavior normal. Behavior is cooperative.        Thought Content: Thought content normal.        Judgment: Judgment normal.    Results for orders placed or performed in visit on 02/07/21  Bayer DCA Hb A1c Waived  Result Value Ref Range   HB A1C (BAYER DCA - WAIVED) 6.6 <7.0 %  CBC with Differential/Platelet  Result Value Ref Range   WBC 5.6 3.4 - 10.8 x10E3/uL   RBC 4.75 3.77 - 5.28 x10E6/uL   Hemoglobin 13.3 11.1 - 15.9 g/dL   Hematocrit 40.5 34.0 - 46.6 %   MCV 85 79 - 97 fL   MCH 28.0 26.6 - 33.0 pg   MCHC 32.8 31.5 - 35.7 g/dL   RDW 13.4 11.7 - 15.4 %   Platelets 338 150 - 450 x10E3/uL   Neutrophils 52 Not  Estab. %   Lymphs 38 Not Estab. %   Monocytes 7 Not Estab. %   Eos 2 Not Estab. %   Basos 1 Not Estab. %   Neutrophils Absolute 3.0 1.4 - 7.0 x10E3/uL   Lymphocytes Absolute 2.1 0.7 - 3.1 x10E3/uL   Monocytes Absolute 0.4 0.1 - 0.9 x10E3/uL   EOS (ABSOLUTE) 0.1 0.0 - 0.4 x10E3/uL   Basophils Absolute 0.0 0.0 - 0.2 x10E3/uL   Immature Granulocytes  0 Not Estab. %   Immature Grans (Abs) 0.0 0.0 - 0.1 x10E3/uL  Comprehensive metabolic panel  Result Value Ref Range   Glucose 174 (H) 65 - 99 mg/dL   BUN 12 8 - 27 mg/dL   Creatinine, Ser 0.64 0.57 - 1.00 mg/dL   eGFR 93 >59 mL/min/1.73   BUN/Creatinine Ratio 19 12 - 28   Sodium 140 134 - 144 mmol/L   Potassium 3.7 3.5 - 5.2 mmol/L   Chloride 97 96 - 106 mmol/L   CO2 23 20 - 29 mmol/L   Calcium 9.7 8.7 - 10.3 mg/dL   Total Protein 6.9 6.0 - 8.5 g/dL   Albumin 4.8 (H) 3.7 - 4.7 g/dL   Globulin, Total 2.1 1.5 - 4.5 g/dL   Albumin/Globulin Ratio 2.3 (H) 1.2 - 2.2   Bilirubin Total 0.7 0.0 - 1.2 mg/dL   Alkaline Phosphatase 48 44 - 121 IU/L   AST 19 0 - 40 IU/L   ALT 17 0 - 32 IU/L  Lipid Panel w/o Chol/HDL Ratio  Result Value Ref Range   Cholesterol, Total 150 100 - 199 mg/dL   Triglycerides 204 (H) 0 - 149 mg/dL   HDL 35 (L) >39 mg/dL   VLDL Cholesterol Cal 34 5 - 40 mg/dL   LDL Chol Calc (NIH) 81 0 - 99 mg/dL  TSH  Result Value Ref Range   TSH 0.207 (L) 0.450 - 4.500 uIU/mL  VITAMIN D 25 Hydroxy (Vit-D Deficiency, Fractures)  Result Value Ref Range   Vit D, 25-Hydroxy 46.3 30.0 - 100.0 ng/mL  Uric acid  Result Value Ref Range   Uric Acid 5.6 3.1 - 7.9 mg/dL      Assessment & Plan:   Problem List Items Addressed This Visit       Endocrine   Type 2 diabetes mellitus with diabetic mononeuropathy, without long-term current use of insulin (HCC) - Primary    Chronic, ongoing.  A1C 6.6% last visit, staying stable, praised for this and urine ALB 10 last visit.  Will change Metformin to XR 1000 MG BID to assist with bowels and  adjust regimen as needed.  Focus on diabetic diet at home and regular exercise.  Increase Gabapentin daily for neuropathy, continue 900 MG at HS and 300 MG in morning, but add on 300 MG at evening time.  Current CrCl = 76 based on recent labs.   Recommend she monitor feet closely for any wounds or skin breakdown, alert provider immediately if present.  Return in 3 months.      Relevant Medications   gabapentin (NEURONTIN) 300 MG capsule     Other   Right upper quadrant abdominal pain    Overall reassuring labs recent visit, TSH slightly on low side, will recheck today.  Abdominal u/s reassuring with some hepatic steatosis, she is working on diet changes.  At this time pain she reports is improving with bowel regimen changes and diet.  Plan to return if worsening symptoms.      Other Visit Diagnoses     Hyperthyroidism       Recheck TSH and Free T4 today.   Relevant Orders   TSH   T4, free   Need for influenza vaccination       Relevant Orders   Flu Vaccine QUAD High Dose(Fluad) (Completed)        Follow up plan: Return in about 2 months (around 05/13/2021) for T2DM, HTN/HLD, OSA.

## 2021-03-13 NOTE — Patient Instructions (Signed)
Abdominal Pain, Adult Many things can cause belly (abdominal) pain. Most times, belly pain is not dangerous. Many cases of belly pain can be watched and treated at home. Sometimes, though, belly pain is serious. Yourdoctor will try to find the cause of your belly pain. Follow these instructions at home:  Medicines Take over-the-counter and prescription medicines only as told by your doctor. Do not take medicines that help you poop (laxatives) unless told by your doctor. General instructions Watch your belly pain for any changes. Drink enough fluid to keep your pee (urine) pale yellow. Keep all follow-up visits as told by your doctor. This is important. Contact a doctor if: Your belly pain changes or gets worse. You are not hungry, or you lose weight without trying. You are having trouble pooping (constipated) or have watery poop (diarrhea) for more than 2-3 days. You have pain when you pee or poop. Your belly pain wakes you up at night. Your pain gets worse with meals, after eating, or with certain foods. You are vomiting and cannot keep anything down. You have a fever. You have blood in your pee. Get help right away if: Your pain does not go away as soon as your doctor says it should. You cannot stop vomiting. Your pain is only in areas of your belly, such as the right side or the left lower part of the belly. You have bloody or black poop, or poop that looks like tar. You have very bad pain, cramping, or bloating in your belly. You have signs of not having enough fluid or water in your body (dehydration), such as: Dark pee, very little pee, or no pee. Cracked lips. Dry mouth. Sunken eyes. Sleepiness. Weakness. You have trouble breathing or chest pain. Summary Many cases of belly pain can be watched and treated at home. Watch your belly pain for any changes. Take over-the-counter and prescription medicines only as told by your doctor. Contact a doctor if your belly pain  changes or gets worse. Get help right away if you have very bad pain, cramping, or bloating in your belly. This information is not intended to replace advice given to you by your health care provider. Make sure you discuss any questions you have with your healthcare provider. Document Revised: 10/13/2018 Document Reviewed: 10/13/2018 Elsevier Patient Education  2022 [employer]  

## 2021-03-14 LAB — T4, FREE: Free T4: 1 ng/dL (ref 0.82–1.77)

## 2021-03-14 LAB — TSH: TSH: 0.35 u[IU]/mL — ABNORMAL LOW (ref 0.450–4.500)

## 2021-03-14 NOTE — Progress Notes (Signed)
Contacted via [place], your labs have returned -- thyroid (TSH) remains a little on low side but is trending up to normal and Free T4 is normal.  At this time I suggest we recheck at next visit, but I would like to obtain an ultrasound of the thyroid to check on it.  I will order this and they should call to schedule with you.  We will recheck labs at your next visit, if any abnormalities then I may send you to endocrinology.  Any questions? Keep being awesome!!  Thank you for allowing me to participate in your care.  I appreciate you. Kindest regards, Deveron Shamoon

## 2021-03-14 NOTE — Addendum Note (Signed)
Addended by: Marnee Guarneri T on: 03/14/2021 08:14 AM   Modules accepted: Orders

## 2021-03-17 NOTE — Progress Notes (Signed)
Contacted via MyChart   Normal mammogram -- repeat in one year:)

## 2021-03-28 ENCOUNTER — Ambulatory Visit
Admission: RE | Admit: 2021-03-28 | Discharge: 2021-03-28 | Disposition: A | Discharge status: Home / Self Care | Payer: Medicare Other | Source: Ambulatory Visit | Attending: Nurse Practitioner | Admitting: Nurse Practitioner

## 2021-03-28 ENCOUNTER — Other Ambulatory Visit: Payer: Self-pay

## 2021-03-28 ENCOUNTER — Telehealth: Payer: Self-pay | Admitting: Nurse Practitioner

## 2021-03-28 DIAGNOSIS — E042 Nontoxic multinodular goiter: Secondary | ICD-10-CM | POA: Diagnosis not present

## 2021-03-28 DIAGNOSIS — E0590 Thyrotoxicosis, unspecified without thyrotoxic crisis or storm: Secondary | ICD-10-CM | POA: Diagnosis not present

## 2021-03-28 NOTE — Progress Notes (Signed)
Telephone call placed. Review note dated 03/28/21.

## 2021-03-28 NOTE — Telephone Encounter (Signed)
Spoke to patient via telephone to review thyroid ultrasound results -- findings include two nodules which are recommended for FNA and one nodule that is recommended for yearly u/s for 5 years until stable.  Discussed findings with patient and possible diagnoses for these.  Her thyroid levels have been more on hyperthyroid side recently, which she is aware of.  Will obtain FNA of nodules, referral to Dr. Celine Ahr with general surgery, and if abnormal findings and ongoing low TSH levels get into endocrinology.    She reported while on phone that she was not feeling well.  Recently had shingles vaccine and now having ear pain right side and dizziness, worried about ear infection.  Will have staff call and get her scheduled for visit tomorrow.

## 2021-03-29 ENCOUNTER — Encounter: Payer: Self-pay | Admitting: Nurse Practitioner

## 2021-03-29 ENCOUNTER — Ambulatory Visit (INDEPENDENT_AMBULATORY_CARE_PROVIDER_SITE_OTHER): Payer: Medicare Other | Admitting: Nurse Practitioner

## 2021-03-29 VITALS — BP 143/78 | HR 84 | Ht 65.0 in | Wt 210.0 lb

## 2021-03-29 DIAGNOSIS — R42 Dizziness and giddiness: Secondary | ICD-10-CM | POA: Insufficient documentation

## 2021-03-29 MED ORDER — MECLIZINE HCL 12.5 MG PO TABS: 12.5000 mg | ORAL_TABLET | Freq: Three times a day (TID) | ORAL | 0 refills | Status: DC | PRN

## 2021-03-29 NOTE — Assessment & Plan Note (Signed)
Meclizine sent for patient today. Discussed side effects and benefits of medication.  Recommend patient take Zyrtec or Allegra to help with congestion and improve dizziness.  Follow up if symptoms worsen or fail to improve.

## 2021-03-29 NOTE — Progress Notes (Signed)
BP (!) 143/78   Pulse 84   Ht 5\' 5"  (1.651 m)   Wt 210 lb (95.3 kg)   LMP  (LMP Unknown)   BMI 34.95 kg/m    Subjective:    [name], female    DOB: [birth year], 75 y.o.   MRN: [id number]  HPI: Catherine Hinton is a 75 y.o. female  Chief Complaint  Patient presents with   Ear Pain    EAR PAIN Duration:  since Monday evening - patient is also having dizziness.  States its worse when she is laying down and then when she goes to get up it is really bad. Involved ear(s): right Severity:  5/10  Quality:  throbbing Fever: yes Otorrhea: no Upper respiratory infection symptoms: no Pruritus: no Hearing loss: no Water immersion no Using Q-tips: no Recurrent otitis media: no Status: better Treatments attempted:  tylenol but it is not really helping.  Relevant past medical, surgical, family and social history reviewed and updated as indicated. Interim medical history since our last visit reviewed. Allergies and medications reviewed and updated.  Review of Systems  Constitutional:  Negative for fever.  HENT:  Positive for ear pain. Negative for hearing loss.   Neurological:  Positive for dizziness.   Per HPI unless specifically indicated above     Objective:    BP (!) 143/78   Pulse 84   Ht 5\' 5"  (1.651 m)   Wt 210 lb (95.3 kg)   LMP  (LMP Unknown)   BMI 34.95 kg/m   Wt Readings from Last 3 Encounters:  03/29/21 210 lb (95.3 kg)  03/13/21 210 lb 9.6 oz (95.5 kg)  02/07/21 209 lb 12.8 oz (95.2 kg)    Physical Exam Vitals and nursing note reviewed.  Constitutional:      General: She is not in acute distress.    Appearance: Normal appearance. She is normal weight. She is not ill-appearing, toxic-appearing or diaphoretic.  HENT:     Head: Normocephalic.     Right Ear: External ear normal. No tenderness. A middle ear effusion is present.     Left Ear: External ear normal. No tenderness. A middle ear effusion is present.     Nose: Nose normal.      Mouth/Throat:     Mouth: Mucous membranes are moist.     Pharynx: Oropharynx is clear.  Eyes:     General:        Right eye: No discharge.        Left eye: No discharge.     Extraocular Movements: Extraocular movements intact.     Conjunctiva/sclera: Conjunctivae normal.     Pupils: Pupils are equal, round, and reactive to light.  Cardiovascular:     Rate and Rhythm: Normal rate and regular rhythm.     Heart sounds: No murmur heard. Pulmonary:     Effort: Pulmonary effort is normal. No respiratory distress.     Breath sounds: Normal breath sounds. No wheezing or rales.  Musculoskeletal:     Cervical back: Normal range of motion and neck supple.  Skin:    General: Skin is warm and dry.     Capillary Refill: Capillary refill takes less than 2 seconds.  Neurological:     General: No focal deficit present.     Mental Status: She is alert and oriented to person, place, and time. Mental status is at baseline.  Psychiatric:        Mood and Affect: Mood normal.  Behavior: Behavior normal.        Thought Content: Thought content normal.        Judgment: Judgment normal.    Results for orders placed or performed in visit on 03/13/21  TSH  Result Value Ref Range   TSH 0.350 (L) 0.450 - 4.500 uIU/mL  T4, free  Result Value Ref Range   Free T4 1.00 0.82 - 1.77 ng/dL      Assessment & Plan:   Problem List Items Addressed This Visit       Other   Dizziness - Primary    Meclizine sent for patient today. Discussed side effects and benefits of medication.  Recommend patient take Zyrtec or Allegra to help with congestion and improve dizziness.  Follow up if symptoms worsen or fail to improve.        Follow up plan: Return if symptoms worsen or fail to improve.

## 2021-04-17 ENCOUNTER — Ambulatory Visit (INDEPENDENT_AMBULATORY_CARE_PROVIDER_SITE_OTHER): Payer: Medicare Other

## 2021-04-17 VITALS — Ht 63.0 in | Wt 210.0 lb

## 2021-04-17 DIAGNOSIS — Z0000 Encounter for general adult medical examination without abnormal findings: Secondary | ICD-10-CM | POA: Diagnosis not present

## 2021-04-17 NOTE — Progress Notes (Signed)
I connected with [place] today by telephone and verified that I am speaking with the correct person using two identifiers. Location patient: home Location provider: work Persons participating in the virtual visit: [place], Glenna Durand LPN.   I discussed the limitations, risks, security and privacy concerns of performing an evaluation and management service by telephone and the availability of in person appointments. I also discussed with the patient that there may be a patient responsible charge related to this service. The patient expressed understanding and verbally consented to this telephonic visit.    Interactive audio and video telecommunications were attempted between this provider and patient, however failed, due to patient having technical difficulties OR patient did not have access to video capability.  We continued and completed visit with audio only.     Vital signs may be patient reported or missing.  Subjective:   Catherine Hinton is a 75 y.o. female who presents for Medicare Annual (Subsequent) preventive examination.  Review of Systems     Cardiac Risk Factors include: advanced age (>1men, >20 women);diabetes mellitus;dyslipidemia;hypertension;obesity (BMI >30kg/m2);sedentary lifestyle     Objective:    Today's Vitals   04/17/21 1511  Weight: 210 lb (95.3 kg)  Height: 5\' 3"  (1.6 m)   Body mass index is 37.2 kg/m.  Advanced Directives 04/17/2021 02/01/2020 12/25/2019 04/10/2018 04/04/2017 04/25/2016 04/25/2015  Does Patient Have a Medical Advance Directive? No No No Yes Yes Yes -  Type of Advance Directive - - Public librarian;Living will [place];Living will - -  Does patient want to make changes to medical advance directive? - - - (No Data) - - -  Copy of [place] in Chart? - - - - No - copy requested - -  Would patient like information on creating a medical advance directive? - No - Patient  declined No - Patient declined - - - -  Pre-existing out of facility DNR order (yellow form or pink MOST form) - - - - - - Yellow form placed in chart (order not valid for inpatient use)    Current Medications (verified) Outpatient Encounter Medications as of 04/17/2021  Medication Sig   allopurinol (ZYLOPRIM) 300 MG tablet Take 1 tablet (300 mg total) by mouth daily.   aspirin 81 MG EC tablet Take 81 mg by mouth daily.    Cholecalciferol 25 MCG (1000 UT) tablet Take 2,000 Units by mouth daily.    DHA-EPA-Flaxseed Oil-Vitamin E (THERA TEARS NUTRITION PO) Take 1 mg by mouth 2 (two) times daily.    gabapentin (NEURONTIN) 300 MG capsule TAKE 1 CAPSULE EVERY MORNING, AND THEN 1 CAPSULE AT 6 PM, AND THEN TAKE 3 CAPSULES EVERY NIGHT BEFORE BEDTIME.   gemfibrozil (LOPID) 600 MG tablet Take 1 tablet (600 mg total) by mouth 2 (two) times daily.   metFORMIN (GLUCOPHAGE-XR) 500 MG 24 hr tablet Take 2 tablets (1,000 mg total) by mouth in the morning and at bedtime.   metoprolol succinate (TOPROL-XL) 25 MG 24 hr tablet Take 1 tablet (25 mg total) by mouth daily.   Multiple Vitamins-Minerals (EYE VITAMINS PO) Take 1 tablet by mouth daily.   valsartan-hydrochlorothiazide (DIOVAN-HCT) 160-12.5 MG tablet Take 1 tablet by mouth daily.   vitamin B-12 (CYANOCOBALAMIN) 1000 MCG tablet Take 1,000 mcg by mouth daily.   XIIDRA 5 % SOLN Place 1 drop into both eyes 2 (two) times daily.    [START ON 06/28/2021] Zoster Vaccine Adjuvanted [facility]) injection Inject 0.5 mLs into the muscle  once for 1 dose. Dose # 2   latanoprost (XALATAN) 0.005 % ophthalmic solution 1 drop at bedtime. (Patient not taking: Reported on 04/17/2021)   meclizine (ANTIVERT) 12.5 MG tablet Take 1 tablet (12.5 mg total) by mouth 3 (three) times daily as needed for dizziness. (Patient not taking: Reported on 04/17/2021)   No facility-administered encounter medications on file as of 04/17/2021.    Allergies (verified) Niacin and Sulfa  antibiotics   History: Past Medical History:  Diagnosis Date   Chronic kidney disease 04/25/2015   Diabetes mellitus with renal manifestation (HCC)    Diabetes mellitus without complication (HCC)    Glaucoma    Gout    Hyperlipidemia    Hypertension    Hypertensive CKD (chronic kidney disease)    Neuropathy    feet   Obesity    Sleep apnea    CPAP   Past Surgical History:  Procedure Laterality Date   ABDOMINAL HYSTERECTOMY     BREAST CYST ASPIRATION     CHOLECYSTECTOMY     COLONOSCOPY WITH PROPOFOL N/A 02/01/2020   Procedure: COLONOSCOPY WITH PROPOFOL;  Surgeon: [name], MD;  Location: [place];  Service: Endoscopy;  Laterality: N/A;  Diabetic - oral meds sleep apnea priority 4   HERNIA REPAIR     THUMB FUSION     Family History  Problem Relation Age of Onset   Diabetes Mother    Cancer Mother        Pituitary tumor   Hyperlipidemia Mother    Hypertension Mother    Thyroid disease Mother    Stroke Mother    Cancer Father        Lung   Cancer Brother        Oral   Hypertension Son    Cancer Maternal Grandmother        colon   Blindness Maternal Grandfather    Arthritis Paternal Grandmother    Arthritis Daughter        RA   Breast cancer Neg Hx    Social History   Socioeconomic History   Marital status: Divorced    Spouse name: Not on file   Number of children: Not on file   Years of education: Not on file   Highest education level: Associate degree: academic program  Occupational History   Occupation: retired  Tobacco Use   Smoking status: Never   Smokeless tobacco: Never  Scientific laboratory technician Use: Never used  Substance and Sexual Activity   Alcohol use: No    Alcohol/week: 0.0 standard drinks   Drug use: No   Sexual activity: Not Currently  Other Topics Concern   Not on file  Social History Narrative   Not on file   Social Determinants of Health   Financial Resource Strain: Low Risk    Difficulty of Paying Living Expenses:  Not hard at all  Food Insecurity: No Food Insecurity   Worried About Charity fundraiser in the Last Year: Never true   [place] in the Last Year: Never true  Transportation Needs: No Transportation Needs   Lack of Transportation (Medical): No   Lack of Transportation (Non-Medical): No  Physical Activity: Inactive   Days of Exercise per Week: 0 days   Minutes of Exercise per Session: 0 min  Stress: Stress Concern Present   Feeling of Stress : To some extent  Social Connections: Not on file    Tobacco Counseling Counseling given: Not Answered  Clinical Intake:  Pre-visit preparation completed: Yes  Pain : No/denies pain     Nutritional Status: BMI > 30  Obese Nutritional Risks: None Diabetes: Yes  How often do you need to have someone help you when you read instructions, pamphlets, or other written materials from your doctor or pharmacy?: 1 - Never What is the last grade level you completed in school?: associates degree  Diabetic? Yes Nutrition Risk Assessment:  Has the patient had any N/V/D within the last 2 months?  No  Does the patient have any non-healing wounds?  No  Has the patient had any unintentional weight loss or weight gain?  No   Diabetes:  Is the patient diabetic?  Yes  If diabetic, was a CBG obtained today?  No  Did the patient bring in their glucometer from home?  No  How often do you monitor your CBG's? daily.   Financial Strains and Diabetes Management:  Are you having any financial strains with the device, your supplies or your medication? No .  Does the patient want to be seen by Chronic Care Management for management of their diabetes?  No  Would the patient like to be referred to a Nutritionist or for Diabetic Management?  No   Diabetic Exams:  Diabetic Eye Exam: Completed 05/25/2020 Diabetic Foot Exam: Completed 10/10/2020   Interpreter Needed?: No  Information entered by :: NAllen LPN   Activities of Daily Living In your  present state of health, do you have any difficulty performing the following activities: 04/17/2021 02/07/2021  Hearing? N N  Vision? N N  Difficulty concentrating or making decisions? N N  Walking or climbing stairs? Y N  Dressing or bathing? N N  Doing errands, shopping? N N  Preparing Food and eating ? N -  Using the Toilet? N -  In the past six months, have you accidently leaked urine? Y -  Do you have problems with loss of bowel control? N -  Managing your Medications? N -  Managing your Finances? N -  Housekeeping or managing your Housekeeping? N -  Some recent data might be hidden    Patient Care Team: [name], NP as PCP - General (Nurse Practitioner) Anell Barr, OD (Optometry) Vladimir Faster, [facility] (Pharmacist)  Indicate any recent Medical Services you may have received from other than [facility] providers in the past year (date may be approximate).     Assessment:   This is a routine wellness examination for [place].  Hearing/Vision screen Vision Screening - Comments:: Regular eye exams, [facility]  Dietary issues and exercise activities discussed: Current Exercise Habits: The patient does not participate in regular exercise at present   Goals Addressed             This Visit's Progress    Patient Stated       04/17/2021, no goals       Depression Screen PHQ 2/9 Scores 04/17/2021 02/07/2021 07/12/2020 12/25/2019 06/24/2019 05/05/2018 04/10/2018  PHQ - 2 Score 0 0 0 0 0 0 0  PHQ- 9 Score - - 0 7 - 4 5    Fall Risk Fall Risk  04/17/2021 07/12/2020 12/25/2019 03/09/2019 05/02/2018  Falls in the past year? 0 0 1 1 1   Comment - - tripped over curb - [place] Telephone Survey: data to providers prior to load  Number falls in past yr: - - 0 0 1  Comment - - - - [place] Telephone Survey Actual Response =  1  Injury with Fall? - - 1 1 0  Comment - - scrapped knee got an infection - -  Risk for fall due to : Medication side effect - History of fall(s);Medication  side effect;Impaired balance/gait - -  Follow up Falls evaluation completed;Education provided;Falls prevention discussed - Falls evaluation completed;Education provided;Falls prevention discussed Falls evaluation completed -    FALL RISK PREVENTION PERTAINING TO THE HOME:  Any stairs in or around the home? Yes  If so, are there any without handrails? No  Home free of loose throw rugs in walkways, pet beds, electrical cords, etc? Yes  Adequate lighting in your home to reduce risk of falls? Yes   ASSISTIVE DEVICES UTILIZED TO PREVENT FALLS:  Life alert? No  Use of a cane, walker or w/c? No  Grab bars in the bathroom? No  Shower chair or bench in shower? No  Elevated toilet seat or a handicapped toilet? No   TIMED UP AND GO:  Was the test performed? No .      Cognitive Function:     6CIT Screen 12/25/2019 04/10/2018 04/04/2017  What Year? 0 points 0 points 0 points  What month? 0 points 0 points 0 points  What time? 0 points 0 points 0 points  Count back from 20 2 points 0 points 0 points  Months in reverse 0 points 0 points 0 points  Repeat phrase 0 points 0 points 0 points  Total Score 2 0 0    Immunizations Immunization History  Administered Date(s) Administered   Fluad Quad(high Dose 65+) 03/24/2019, 04/08/2020, 03/13/2021   Influenza, High Dose Seasonal PF 04/25/2016, 04/04/2017, 02/18/2018   Influenza,inj,Quad PF,6+ Mos 04/25/2015   Influenza-Unspecified 04/18/2012   PFIZER(Purple Top)SARS-COV-2 Vaccination 07/29/2019, 08/19/2019, 03/20/2020, 12/03/2020   Pneumococcal Conjugate-13 04/20/2014   Pneumococcal Polysaccharide-23 10/26/2015   Pneumococcal-Unspecified 06/18/2006   Tdap 12/12/2010   Zoster Recombinat (Shingrix) 03/23/2021   Zoster, Live 09/21/2013    TDAP status: Due, Education has been provided regarding the importance of this vaccine. Advised may receive this vaccine at local pharmacy or Health Dept. Aware to provide a copy of the vaccination record  if obtained from local pharmacy or Health Dept. Verbalized acceptance and understanding.  Flu Vaccine status: Up to date  Pneumococcal vaccine status: Up to date  Covid-19 vaccine status: Completed vaccines  Qualifies for Shingles Vaccine? Yes   Zostavax completed Yes   Shingrix Completed?: needs second dose  Screening Tests Health Maintenance  Topic Date Due   TETANUS/TDAP  12/11/2020   COVID-19 Vaccine (5 - Booster for Pfizer series) 01/28/2021   Zoster Vaccines- Shingrix (2 of 2) 05/18/2021   OPHTHALMOLOGY EXAM  05/25/2021   HEMOGLOBIN A1C  08/10/2021   FOOT EXAM  10/10/2021   MAMMOGRAM  03/09/2022   COLONOSCOPY (Pts 45-45yrs Insurance coverage will need to be confirmed)  01/31/2030   Pneumonia Vaccine 57+ Years old  Completed   INFLUENZA VACCINE  Completed   DEXA SCAN  Completed   Hepatitis C Screening  Completed   HPV VACCINES  Aged Out    Health Maintenance  Health Maintenance Due  Topic Date Due   TETANUS/TDAP  12/11/2020   COVID-19 Vaccine (5 - Booster for [place] series) 01/28/2021    Colorectal cancer screening: Type of screening: Colonoscopy. Completed 02/01/2020. Repeat every 10 years  Mammogram status: Completed 03/09/2021. Repeat every year  Bone Density status: Completed 02/03/2020.   Lung Cancer Screening: (Low Dose CT Chest recommended if Age 87-80 years, 30 pack-year currently smoking  OR have quit w/in 15years.) does not qualify.   Lung Cancer Screening Referral: no  Additional Screening:  Hepatitis C Screening: does qualify; Completed 04/25/2015  Vision Screening: Recommended annual ophthalmology exams for early detection of glaucoma and other disorders of the eye. Is the patient up to date with their annual eye exam?  Yes  Who is the provider or what is the name of the office in which the patient attends annual eye exams? [facility] If pt is not established with a provider, would they like to be referred to a provider to establish care? No .    Dental Screening: Recommended annual dental exams for proper oral hygiene  Community Resource Referral / Chronic Care Management: CRR required this visit?  No   CCM required this visit?  No      Plan:     I have personally reviewed and noted the following in the patient's chart:   Medical and social history Use of alcohol, tobacco or illicit drugs  Current medications and supplements including opioid prescriptions.  Functional ability and status Nutritional status Physical activity Advanced directives List of other physicians Hospitalizations, surgeries, and ER visits in previous 12 months Vitals Screenings to include cognitive, depression, and falls Referrals and appointments  In addition, I have reviewed and discussed with patient certain preventive protocols, quality metrics, and best practice recommendations. A written personalized care plan for preventive services as well as general preventive health recommendations were provided to patient.     Kellie Simmering, LPN   [id number]   Nurse Notes: 6 CIT not administered. Patient appeared cognitive per conversation.

## 2021-04-17 NOTE — Patient Instructions (Signed)
Catherine Hinton , Thank you for taking time to come for your Medicare Wellness Visit. I appreciate your ongoing commitment to your health goals. Please review the following plan we discussed and let me know if I can assist you in the future.   Screening recommendations/referrals: Colonoscopy: completed 02/01/2020 Mammogram: completed 03/09/2021 Bone Density: completed 02/03/2020 Recommended yearly ophthalmology/optometry visit for glaucoma screening and checkup Recommended yearly dental visit for hygiene and checkup  Vaccinations: Influenza vaccine: completed 03/13/2021 Pneumococcal vaccine: completed 10/26/2015 Tdap vaccine: due Shingles vaccine: needs second dose   Covid-19: 12/03/2020, 03/20/2020, 08/19/2019, 07/29/2019  Advanced directives: Advance directive discussed with you today.   Conditions/risks identified: none  Next appointment: Follow up in one year for your annual wellness visit    Preventive Care 65 Years and Older, Female Preventive care refers to lifestyle choices and visits with your health care provider that can promote health and wellness. What does preventive care include? A yearly physical exam. This is also called an annual well check. Dental exams once or twice a year. Routine eye exams. Ask your health care provider how often you should have your eyes checked. Personal lifestyle choices, including: Daily care of your teeth and gums. Regular physical activity. Eating a healthy diet. Avoiding tobacco and drug use. Limiting alcohol use. Practicing safe sex. Taking low-dose aspirin every day. Taking vitamin and mineral supplements as recommended by your health care provider. What happens during an annual well check? The services and screenings done by your health care provider during your annual well check will depend on your age, overall health, lifestyle risk factors, and family history of disease. Counseling  Your health care provider may ask you questions about  your: Alcohol use. Tobacco use. Drug use. Emotional well-being. Home and relationship well-being. Sexual activity. Eating habits. History of falls. Memory and ability to understand (cognition). Work and work Statistician. Reproductive health. Screening  You may have the following tests or measurements: Height, weight, and BMI. Blood pressure. Lipid and cholesterol levels. These may be checked every 5 years, or more frequently if you are over 90 years old. Skin check. Lung cancer screening. You may have this screening every year starting at age 4 if you have a 30-pack-year history of smoking and currently smoke or have quit within the past 15 years. Fecal occult blood test (FOBT) of the stool. You may have this test every year starting at age 37. Flexible sigmoidoscopy or colonoscopy. You may have a sigmoidoscopy every 5 years or a colonoscopy every 10 years starting at age 15. Hepatitis C blood test. Hepatitis B blood test. Sexually transmitted disease (STD) testing. Diabetes screening. This is done by checking your blood sugar (glucose) after you have not eaten for a while (fasting). You may have this done every 1-3 years. Bone density scan. This is done to screen for osteoporosis. You may have this done starting at age 53. Mammogram. This may be done every 1-2 years. Talk to your health care provider about how often you should have regular mammograms. Talk with your health care provider about your test results, treatment options, and if necessary, the need for more tests. Vaccines  Your health care provider may recommend certain vaccines, such as: Influenza vaccine. This is recommended every year. Tetanus, diphtheria, and acellular pertussis (Tdap, Td) vaccine. You may need a Td booster every 10 years. Zoster vaccine. You may need this after age 36. Pneumococcal 13-valent conjugate (PCV13) vaccine. One dose is recommended after age 23. Pneumococcal polysaccharide (PPSV23) vaccine.  One  dose is recommended after age 60. Talk to your health care provider about which screenings and vaccines you need and how often you need them. This information is not intended to replace advice given to you by your health care provider. Make sure you discuss any questions you have with your health care provider. Document Released: 07/01/2015 Document Revised: 02/22/2016 Document Reviewed: 04/05/2015 Elsevier Interactive Patient Education  2017 [place] Prevention in the Home Falls can cause injuries. They can happen to people of all ages. There are many things you can do to make your home safe and to help prevent falls. What can I do on the outside of my home? Regularly fix the edges of walkways and driveways and fix any cracks. Remove anything that might make you trip as you walk through a door, such as a raised step or threshold. Trim any bushes or trees on the path to your home. Use bright outdoor lighting. Clear any walking paths of anything that might make someone trip, such as rocks or tools. Regularly check to see if handrails are loose or broken. Make sure that both sides of any steps have handrails. Any raised decks and porches should have guardrails on the edges. Have any leaves, snow, or ice cleared regularly. Use sand or salt on walking paths during winter. Clean up any spills in your garage right away. This includes oil or grease spills. What can I do in the bathroom? Use night lights. Install grab bars by the toilet and in the tub and shower. Do not use towel bars as grab bars. Use non-skid mats or decals in the tub or shower. If you need to sit down in the shower, use a plastic, non-slip stool. Keep the floor dry. Clean up any water that spills on the floor as soon as it happens. Remove soap buildup in the tub or shower regularly. Attach bath mats securely with double-sided non-slip rug tape. Do not have throw rugs and other things on the floor that can make  you trip. What can I do in the bedroom? Use night lights. Make sure that you have a light by your bed that is easy to reach. Do not use any sheets or blankets that are too big for your bed. They should not hang down onto the floor. Have a firm chair that has side arms. You can use this for support while you get dressed. Do not have throw rugs and other things on the floor that can make you trip. What can I do in the kitchen? Clean up any spills right away. Avoid walking on wet floors. Keep items that you use a lot in easy-to-reach places. If you need to reach something above you, use a strong step stool that has a grab bar. Keep electrical cords out of the way. Do not use floor polish or wax that makes floors slippery. If you must use wax, use non-skid floor wax. Do not have throw rugs and other things on the floor that can make you trip. What can I do with my stairs? Do not leave any items on the stairs. Make sure that there are handrails on both sides of the stairs and use them. Fix handrails that are broken or loose. Make sure that handrails are as long as the stairways. Check any carpeting to make sure that it is firmly attached to the stairs. Fix any carpet that is loose or worn. Avoid having throw rugs at the top or bottom of the stairs.  If you do have throw rugs, attach them to the floor with carpet tape. Make sure that you have a light switch at the top of the stairs and the bottom of the stairs. If you do not have them, ask someone to add them for you. What else can I do to help prevent falls? Wear shoes that: Do not have high heels. Have rubber bottoms. Are comfortable and fit you well. Are closed at the toe. Do not wear sandals. If you use a stepladder: Make sure that it is fully opened. Do not climb a closed stepladder. Make sure that both sides of the stepladder are locked into place. Ask someone to hold it for you, if possible. Clearly mark and make sure that you can  see: Any grab bars or handrails. First and last steps. Where the edge of each step is. Use tools that help you move around (mobility aids) if they are needed. These include: Canes. Walkers. Scooters. Crutches. Turn on the lights when you go into a dark area. Replace any light bulbs as soon as they burn out. Set up your furniture so you have a clear path. Avoid moving your furniture around. If any of your floors are uneven, fix them. If there are any pets around you, be aware of where they are. Review your medicines with your doctor. Some medicines can make you feel dizzy. This can increase your chance of falling. Ask your doctor what other things that you can do to help prevent falls. This information is not intended to replace advice given to you by your health care provider. Make sure you discuss any questions you have with your health care provider. Document Released: 03/31/2009 Document Revised: 11/10/2015 Document Reviewed: 07/09/2014 Elsevier Interactive Patient Education  2017 [employer].

## 2021-04-27 ENCOUNTER — Encounter: Payer: Self-pay | Admitting: Nurse Practitioner

## 2021-05-01 ENCOUNTER — Ambulatory Visit (INDEPENDENT_AMBULATORY_CARE_PROVIDER_SITE_OTHER): Payer: Medicare Other

## 2021-05-01 VITALS — BP 118/70

## 2021-05-01 DIAGNOSIS — I152 Hypertension secondary to endocrine disorders: Secondary | ICD-10-CM

## 2021-05-01 DIAGNOSIS — E1141 Type 2 diabetes mellitus with diabetic mononeuropathy: Secondary | ICD-10-CM

## 2021-05-01 DIAGNOSIS — E1159 Type 2 diabetes mellitus with other circulatory complications: Secondary | ICD-10-CM

## 2021-05-01 NOTE — Patient Instructions (Signed)
Catherine Hinton,  Thank you for talking with me today. I have included our care plan/goals in the following pages.   Please review and call me at [phone] with any questions.  Thanks! [name], PharmD Clinical Pharmacist  [phone]  Care Plan : [place]  Updates made by Madelin Rear, [facility] since 05/01/2021 12:00 AM     Problem: DM, HTN, HLD, Osteopenia, gout   Priority: High     Long-Range Goal: Disease Management   Start Date: 10/12/2020  Recent Progress: On track  Priority: High  Note:   Current Barriers:  Unable to achieve control of Hyperlipidemia   Pharmacist Clinical Goal(s):  Patient will verbalize ability to afford treatment regimen contact provider office for questions/concerns as evidenced notation of same in electronic health record through collaboration with PharmD and provider.   Interventions: 1:1 collaboration with [name], NP regarding development and update of comprehensive plan of care as evidenced by provider attestation and co-signature Inter-disciplinary care team collaboration (see longitudinal plan of care) Comprehensive medication review performed; medication list updated in electronic medical record  Hypertension (BP goal <130/80) -Controlled -OSA Reports BP 03/2021 due to dealing with concerns over thyroid. Home BP from Saturday 11/12 - 118/70  -Current treatment: Valsartan-hctz 160-12.5 mg once daily Metoprolol succinate 25 mg daily -Current home readings: continues with 131Y-388I systolic at home  -Denies hypotensive/hypertensive symptoms -Counseled to monitor BP at home 1-2x/week, document, and provide log at future appointments -Recommended to continue current medication  Diabetes (A1c goal <7%) -Controlled -Current medications: Metformin XR 500 mg - two tabs BID (switched from IR to XR 01/2021) -Medications previously tried: Metformin IR (GI pain) -Current home glucose readings fasting  glucose: 124, 128, some 130s -Denies hypoglycemic/hyperglycemic symptoms -Current meal patterns:  Nov 2022 update: appetite has gotten better, good intake of vegetables.  -Current exercise:  Nov 2022: no updates Aug 2022: limited due to recent GI pain - has f/u scheduled for this issue -Educated on A1c and blood sugar goals; Benefits of routine self-monitoring of blood sugar; -Recommended to continue current medication  Patient Goals/Self-Care Activities Patient will:  - take medications as prescribed target a minimum of 150 minutes of moderate intensity exercise weekly  Follow Up Plan: f/u rph 6 months  Medication Assistance: None required.  Patient affirms current coverage meets needs.   The patient verbalized understanding of instructions provided today and agreed to receive a MyChart copy of patient instruction and/or educational materials. Telephone follow up appointment with pharmacy team member scheduled for: See next appointment with "Care Management Staff" under "What's Next" below.

## 2021-05-01 NOTE — Progress Notes (Signed)
Chronic Care Management Pharmacy Note  05/01/2021 Name:  Catherine Hinton MRN:  [id number] DOB:  22-Aug-1945  Recommendations/Changes made from today's visit: Nov 2022: No changes, stomach pain improved and tolerating metformin XR well. Requesting update on thyroid referral  Aug 2022: Consider switch to moderate-high intensity statin and follow-up labs to determine need for Rx fish oil following completion of GI evaluation Could also consider holding gemfibrozil due to potential for dyspepsia ~20%   Subjective: Catherine Hinton is an 75 y.o. year old female who is a primary patient of Cannady, Barbaraann Faster, NP.  The CCM team was consulted for assistance with disease management and care coordination needs.    Engaged with patient by telephone for follow up visit in response to provider referral for pharmacy case management and/or care coordination services.   Consent to Services:  The patient was given information about Chronic Care Management services, agreed to services, and gave verbal consent prior to initiation of services.  Please see initial visit note for detailed documentation.   Patient Care Team: [name], NP as PCP - General (Nurse Practitioner) Anell Barr, [place] (Optometry) Vladimir Faster, [facility] (Pharmacist)  Hospital visits: None in previous 6 months  Objective:  Lab Results  Component Value Date   CREATININE 0.64 02/07/2021   CREATININE 0.61 07/12/2020   CREATININE 0.59 04/08/2020    Lab Results  Component Value Date   HGBA1C 6.6 02/07/2021   Last diabetic Eye exam:  Lab Results  Component Value Date/Time   HMDIABEYEEXA No Retinopathy 05/25/2020 12:00 AM    Last diabetic Foot exam:  Lab Results  Component Value Date/Time   HMDIABFOOTEX PP 11/02/2014 12:00 AM        Component Value Date/Time   CHOL 150 02/07/2021 0903   CHOL 225 (H) 12/09/2018 0820   TRIG 204 (H) 02/07/2021 0903   TRIG 115 12/09/2018 0820   HDL 35 (L) 02/07/2021 0903    VLDL 23 12/09/2018 0820   LDLCALC 81 02/07/2021 0903    Hepatic Function Latest Ref Rng & Units 02/07/2021 10/02/2019 09/18/2019  Total Protein 6.0 - 8.5 g/dL 6.9 7.1 7.1  Albumin 3.7 - 4.7 g/dL 4.8(H) 4.7 4.8(H)  AST 0 - 40 IU/L '19 16 19  ' ALT 0 - 32 IU/L '17 20 17  ' Alk Phosphatase 44 - 121 IU/L 48 46 48  Total Bilirubin 0.0 - 1.2 mg/dL 0.7 0.4 0.4    Lab Results  Component Value Date/Time   TSH 0.350 (L) 03/13/2021 11:44 AM   TSH 0.207 (L) 02/07/2021 09:03 AM   FREET4 1.00 03/13/2021 11:44 AM    CBC Latest Ref Rng & Units 02/07/2021 10/02/2019 09/18/2019  WBC 3.4 - 10.8 x10E3/uL 5.6 5.4 6.7  Hemoglobin 11.1 - 15.9 g/dL 13.3 13.3 13.8  Hematocrit 34.0 - 46.6 % 40.5 39.6 39.4  Platelets 150 - 450 x10E3/uL 338 319 291    Lab Results  Component Value Date/Time   VD25OH 46.3 02/07/2021 09:03 AM   VD25OH 41.5 07/12/2020 09:47 AM    Clinical ASCVD: No  The 10-year ASCVD risk score (Arnett DK, et al., 2019) is: 26.2%   Values used to calculate the score:     Age: 75 years     Sex: Female     Is Non-Hispanic African American: No     Diabetic: Yes     Tobacco smoker: No     Systolic Blood Pressure: 803 mmHg     Is BP treated: Yes  HDL Cholesterol: 35 mg/dL     Total Cholesterol: 150 mg/dL     Social History   Tobacco Use  Smoking Status Never  Smokeless Tobacco Never   BP Readings from Last 3 Encounters:  04/29/21 118/70  03/29/21 (!) 143/78  03/13/21 96/66   Pulse Readings from Last 3 Encounters:  03/29/21 84  03/13/21 88  02/07/21 92   Wt Readings from Last 3 Encounters:  04/17/21 210 lb (95.3 kg)  03/29/21 210 lb (95.3 kg)  03/13/21 210 lb 9.6 oz (95.5 kg)    Assessment: Review of patient past medical history, allergies, medications, health status, including review of consultants reports, laboratory and other test data, was performed as part of comprehensive evaluation and provision of chronic care management services.   SDOH:  (Social Determinants of  Health) assessments and interventions performed:    CCM Care Plan  Allergies  Allergen Reactions   Niacin Itching   Sulfa Antibiotics Other (See Comments)    Fever    Medications Reviewed Today     Reviewed by Madelin Rear, [facility] (Pharmacist) on 05/01/21 at 1328  Med List Status: <None>   Medication Order Taking? Sig Documenting Provider Last Dose Status Informant  allopurinol (ZYLOPRIM) 300 MG tablet [id number] Yes Take 1 tablet (300 mg total) by mouth daily. Marnee Guarneri T, NP  Active   aspirin 81 MG EC tablet [id number]  Take 81 mg by mouth daily.  [provider]  Active   Cholecalciferol 25 MCG (1000 UT) tablet [id number]  Take 2,000 Units by mouth daily.  [provider]  Active            Med Note Amado Coe   Wed Mar 29, 2021 10:15 AM)    DHA-EPA-Flaxseed Oil-Vitamin E Creed Copper TEARS NUTRITION PO) [id number]  Take 1 mg by mouth 2 (two) times daily.  [provider]  Active   gabapentin (NEURONTIN) 300 MG capsule [id number]  TAKE 1 CAPSULE EVERY MORNING, AND THEN 1 CAPSULE AT 6 PM, AND THEN TAKE 3 CAPSULES EVERY NIGHT BEFORE BEDTIME. Marnee Guarneri T, NP  Active   gemfibrozil (LOPID) 600 MG tablet [id number] Yes Take 1 tablet (600 mg total) by mouth 2 (two) times daily. Marnee Guarneri T, NP  Active   latanoprost (XALATAN) 0.005 % ophthalmic solution [id number]  1 drop at bedtime.  Patient not taking: Reported on 04/17/2021   [provider]  Active   meclizine (ANTIVERT) 12.5 MG tablet [id number]  Take 1 tablet (12.5 mg total) by mouth 3 (three) times daily as needed for dizziness.  Patient not taking: Reported on 04/17/2021   Jon Billings, NP  Active   metFORMIN (GLUCOPHAGE-XR) 500 MG 24 hr tablet [id number] Yes Take 2 tablets (1,000 mg total) by mouth in the morning and at bedtime. Marnee Guarneri T, NP  Active   metoprolol succinate (TOPROL-XL) 25 MG 24 hr tablet [id number] Yes Take 1 tablet (25 mg total) by mouth daily. Marnee Guarneri T, NP  Active   Multiple Vitamins-Minerals (EYE VITAMINS PO) [id number]  Take 1 tablet by mouth daily. [provider]  Active   valsartan-hydrochlorothiazide (DIOVAN-HCT) 160-12.5 MG tablet [id number] Yes Take 1 tablet by mouth daily. Marnee Guarneri T, NP  Active   vitamin B-12 (CYANOCOBALAMIN) 1000 MCG tablet [id number]  Take 1,000 mcg by mouth daily. [provider]  Active   XIIDRA 5 % SOLN [id number]  Place 1 drop into both eyes 2 (two) times daily.  [provider]  Active Self  Med Note Amado Coe   Wed Mar 29, 2021 10:16 AM)    Zoster Vaccine Adjuvanted [facility]) injection [id number]  Inject 0.5 mLs into the muscle once for 1 dose. Dose # 2 [name], NP  Active             Patient Active Problem List   Diagnosis Date Noted   Dizziness 03/29/2021   Multiple thyroid nodules 03/28/2021   Right upper quadrant abdominal pain 02/07/2021   Aortic atherosclerosis ([place]) 02/04/2021   Osteopenia 04/08/2020   B12 deficiency 10/04/2019   Gout 06/24/2019   Vitamin D deficiency 05/05/2018   Type 2 diabetes mellitus with diabetic mononeuropathy, without long-term current use of insulin ([place]) 11/06/2017   Hypertension associated with diabetes ([place]) 10/26/2015   Dermatochalasis of both upper eyelids 07/21/2015   Squamous blepharitis of upper and lower eyelids of both eyes 07/21/2015   Obstructive sleep apnea 04/22/2015   Hyperlipidemia associated with type 2 diabetes mellitus ([place]) 04/22/2015   Morbid obesity ([place]) 04/22/2015   Insomnia 04/22/2015    Immunization History  Administered Date(s) Administered   Fluad Quad(high Dose 65+) 03/24/2019, 04/08/2020, 03/13/2021   Influenza, High Dose Seasonal PF 04/25/2016, 04/04/2017, 02/18/2018   Influenza,inj,Quad PF,6+ Mos 04/25/2015   Influenza-Unspecified 04/18/2012   PFIZER(Purple Top)SARS-COV-2 Vaccination 07/29/2019, 08/19/2019, 03/20/2020, 12/03/2020   Pneumococcal Conjugate-13  04/20/2014   Pneumococcal Polysaccharide-23 10/26/2015   Pneumococcal-Unspecified 06/18/2006   Tdap 12/12/2010   Zoster Recombinat (Shingrix) 03/23/2021   Zoster, Live 09/21/2013    Conditions to be addressed/monitored: HTN, HLD, Hypertriglyceridemia, and DMII  Care Plan : [place]  Updates made by Madelin Rear, [facility] since 05/01/2021 12:00 AM     Problem: DM, HTN, HLD, Osteopenia, gout   Priority: High     Long-Range Goal: Disease Management   Start Date: 10/12/2020  Recent Progress: On track  Priority: High  Note:   Current Barriers:  Unable to achieve control of Hyperlipidemia   Pharmacist Clinical Goal(s):  Patient will verbalize ability to afford treatment regimen contact provider office for questions/concerns as evidenced notation of same in electronic health record through collaboration with PharmD and provider.   Interventions: 1:1 collaboration with [name], NP regarding development and update of comprehensive plan of care as evidenced by provider attestation and co-signature Inter-disciplinary care team collaboration (see longitudinal plan of care) Comprehensive medication review performed; medication list updated in electronic medical record  Hypertension (BP goal <130/80) -Controlled -OSA Reports BP 03/2021 due to dealing with concerns over thyroid. Home BP from Saturday 11/12 - 118/70  -Current treatment: Valsartan-hctz 160-12.5 mg once daily Metoprolol succinate 25 mg daily -Current home readings: continues with [id number] systolic at home  -Denies hypotensive/hypertensive symptoms -Counseled to monitor BP at home 1-2x/week, document, and provide log at future appointments -Recommended to continue current medication  Diabetes (A1c goal <7%) -Controlled -Current medications: Metformin XR 500 mg - two tabs BID (switched from IR to XR 01/2021) -Medications previously tried: Metformin IR (GI pain) -Current home glucose  readings fasting glucose: 124, 128, some 130s -Denies hypoglycemic/hyperglycemic symptoms -Current meal patterns:  Nov 2022 update: appetite has gotten better, good intake of vegetables.  -Current exercise:  Nov 2022: no updates Aug 2022: limited due to recent GI pain - has f/u scheduled for this issue -Educated on A1c and blood sugar goals; Benefits of routine self-monitoring of blood sugar; -Recommended to continue current medication  Patient Goals/Self-Care Activities Patient will:  - take medications as prescribed target a minimum of  150 minutes of moderate intensity exercise weekly  Follow Up Plan: f/u rph 6 months  Medication Assistance: None required.  Patient affirms current coverage meets needs.   Patient's preferred pharmacy is:  [facility] - [place], [address] [place] [address] Phone: [phone] Fax: [phone]  Future Appointments  Date Time Provider [place]  05/17/2021 11:00 AM Marnee Guarneri T, NP [facility]  04/19/2022 12:45 PM CFP NURSE HEALTH ADVISOR [facility]FP[facility] [place], PharmD, CPP Clinical Pharmacist Practitioner  [phone]

## 2021-05-05 DIAGNOSIS — Z23 Encounter for immunization: Secondary | ICD-10-CM | POA: Diagnosis not present

## 2021-05-17 ENCOUNTER — Ambulatory Visit (INDEPENDENT_AMBULATORY_CARE_PROVIDER_SITE_OTHER): Payer: Medicare Other | Admitting: Nurse Practitioner

## 2021-05-17 ENCOUNTER — Encounter: Payer: Self-pay | Admitting: Nurse Practitioner

## 2021-05-17 ENCOUNTER — Other Ambulatory Visit: Payer: Self-pay

## 2021-05-17 VITALS — BP 113/75 | HR 99 | Temp 99.5°F | Wt 211.6 lb

## 2021-05-17 DIAGNOSIS — E042 Nontoxic multinodular goiter: Secondary | ICD-10-CM | POA: Diagnosis not present

## 2021-05-17 DIAGNOSIS — Z7984 Long term (current) use of oral hypoglycemic drugs: Secondary | ICD-10-CM

## 2021-05-17 DIAGNOSIS — I10 Essential (primary) hypertension: Secondary | ICD-10-CM | POA: Diagnosis not present

## 2021-05-17 DIAGNOSIS — E0590 Thyrotoxicosis, unspecified without thyrotoxic crisis or storm: Secondary | ICD-10-CM | POA: Insufficient documentation

## 2021-05-17 DIAGNOSIS — E785 Hyperlipidemia, unspecified: Secondary | ICD-10-CM | POA: Diagnosis not present

## 2021-05-17 DIAGNOSIS — E1141 Type 2 diabetes mellitus with diabetic mononeuropathy: Secondary | ICD-10-CM | POA: Diagnosis not present

## 2021-05-17 DIAGNOSIS — E1159 Type 2 diabetes mellitus with other circulatory complications: Secondary | ICD-10-CM

## 2021-05-17 DIAGNOSIS — G4733 Obstructive sleep apnea (adult) (pediatric): Secondary | ICD-10-CM

## 2021-05-17 DIAGNOSIS — I152 Hypertension secondary to endocrine disorders: Secondary | ICD-10-CM

## 2021-05-17 DIAGNOSIS — E1169 Type 2 diabetes mellitus with other specified complication: Secondary | ICD-10-CM | POA: Diagnosis not present

## 2021-05-17 LAB — BAYER DCA HB A1C WAIVED: HB A1C (BAYER DCA - WAIVED): 6.3 % — ABNORMAL HIGH (ref 4.8–5.6)

## 2021-05-17 NOTE — Assessment & Plan Note (Signed)
BMI 37.48.  Recommended eating smaller high protein, low fat meals more frequently and exercising 30 mins a day 5 times a week with a goal of 10-15lb weight loss in the next 3 months. Patient voiced their understanding and motivation to adhere to these recommendations.

## 2021-05-17 NOTE — Patient Instructions (Signed)

## 2021-05-17 NOTE — Progress Notes (Signed)
BP 113/75   Pulse 99   Temp 99.5 F (37.5 C) (Oral)   Wt 211 lb 9.6 oz (96 kg)   LMP  (LMP Unknown)   SpO2 97%   BMI 37.48 kg/m    Subjective:    Patient ID: [name], female    DOB: [birth year], 75 y.o.   MRN: [id number]  HPI: Catherine Hinton is a 75 y.o. female  Chief Complaint  Patient presents with   Diabetes    Patient is here for a follow up. Patient denies having any concerns at today's visit. Patient states everything is going well.    Hyperlipidemia   Hypertension   Sleep Apnea   DIABETES Last A1C 6.6% August.  Continues on Metformin 1000 MG BID.  Currently takes Gabapentin for neuropathy pain, which is offering benefit. Hypoglycemic episodes:no Polydipsia/polyuria: no Visual disturbance: no Chest pain: no Paresthesias: no Glucose Monitoring: yes             Accucheck frequency: Daily             Fasting glucose: this morning was 133, usually in the 130 range             Post prandial:             Evening:             Before meals: Taking Insulin?: no             Long acting insulin:             Short acting insulin: Blood Pressure Monitoring: daily Retinal Examination: Up to Date -- has glaucoma which she was taking Latanoprost for, but they took her off these -- Dr. Hall Busing Exam: Up to Date Pneumovax: Up to Date Influenza: Up to Date Aspirin: yes    HYPERTENSION / HYPERLIPIDEMIA Taking Metoprolol and Valsartan-HCTZ, ASA and Lopid.  Continues on 1000 units Vit D for Vit D deficiency.    Has CPAP and uses this nightly. Satisfied with current treatment? yes Duration of hypertension: chronic BP monitoring frequency: a few times a week BP range: usually <110/70 BP medication side effects: no Duration of hyperlipidemia: chronic Cholesterol medication side effects: no Cholesterol supplements: none Medication compliance: good compliance Aspirin: yes Recent stressors: no Recurrent headaches: no Visual changes: no Palpitations:  no Dyspnea: no Chest pain: no Lower extremity edema: no Dizzy/lightheaded: no   HYPERTHYROID AND THYROID NODULE Noted on recent labs, last TSH 0.350 and Free T4 1.00.  Had thyroid ultrasound 03/28/21 with nodule noted to right mid and inferior lobes that require biopsy + a nodule in isthmus that meets criteria for annual imaging for 5 years.  Goes in 2 weeks for biopsies.  She does endorse some ongoing dizziness with position changes. Fatigue: no Cold intolerance: no Heat intolerance: no Weight gain: no Weight loss: no Constipation: yes Diarrhea/loose stools: no Palpitations: no Lower extremity edema: no Anxiety/depressed mood: no    Relevant past medical, surgical, family and social history reviewed and updated as indicated. Interim medical history since our last visit reviewed. Allergies and medications reviewed and updated.  Review of Systems  Constitutional:  Negative for activity change, appetite change, diaphoresis, fatigue and fever.  Respiratory:  Negative for cough, chest tightness, shortness of breath and wheezing.   Cardiovascular:  Negative for chest pain, palpitations and leg swelling.  Gastrointestinal: Negative.   Endocrine: Negative for cold intolerance, heat intolerance, polydipsia, polyphagia and polyuria.  Genitourinary: Negative.   Neurological: Negative.  Psychiatric/Behavioral: Negative.     Per HPI unless specifically indicated above     Objective:    BP 113/75   Pulse 99   Temp 99.5 F (37.5 C) (Oral)   Wt 211 lb 9.6 oz (96 kg)   LMP  (LMP Unknown)   SpO2 97%   BMI 37.48 kg/m   Wt Readings from Last 3 Encounters:  05/17/21 211 lb 9.6 oz (96 kg)  04/17/21 210 lb (95.3 kg)  03/29/21 210 lb (95.3 kg)    Physical Exam Vitals and nursing note reviewed.  Constitutional:      General: She is awake. She is not in acute distress.    Appearance: She is well-developed. She is obese. She is not ill-appearing.  HENT:     Head: Normocephalic.      Right Ear: Hearing, tympanic membrane and ear canal normal. No drainage.     Left Ear: Hearing, tympanic membrane, ear canal and external ear normal. No drainage.     Nose: Nose normal.     Mouth/Throat:     Mouth: Mucous membranes are moist.     Pharynx: Oropharynx is clear.  Eyes:     General: Lids are normal.        Right eye: No discharge.        Left eye: No discharge.     Conjunctiva/sclera: Conjunctivae normal.     Pupils: Pupils are equal, round, and reactive to light.  Neck:     Vascular: No carotid bruit.  Cardiovascular:     Rate and Rhythm: Normal rate and regular rhythm.     Heart sounds: Normal heart sounds. No murmur heard.   No gallop.  Pulmonary:     Effort: Pulmonary effort is normal. No accessory muscle usage or respiratory distress.     Breath sounds: Normal breath sounds.  Abdominal:     General: Bowel sounds are normal. There is no distension.     Palpations: Abdomen is soft. There is no hepatomegaly.     Tenderness: There is no abdominal tenderness. There is no right CVA tenderness or left CVA tenderness.  Musculoskeletal:     Cervical back: Normal range of motion and neck supple.     Right lower leg: No edema.     Left lower leg: No edema.  Lymphadenopathy:     Cervical: No cervical adenopathy.  Skin:    General: Skin is warm and dry.  Neurological:     Mental Status: She is alert and oriented to person, place, and time.  Psychiatric:        Attention and Perception: Attention normal.        Mood and Affect: Mood normal.        Behavior: Behavior normal. Behavior is cooperative.        Thought Content: Thought content normal.        Judgment: Judgment normal.   Results for orders placed or performed in visit on 03/13/21  TSH  Result Value Ref Range   TSH 0.350 (L) 0.450 - 4.500 uIU/mL  T4, free  Result Value Ref Range   Free T4 1.00 0.82 - 1.77 ng/dL      Assessment & Plan:   Problem List Items Addressed This Visit       Cardiovascular  and Mediastinum   Hypertension associated with diabetes ([place])    Chronic, stable with BP below goal on home readings and at goal in office. Continue current medication regimen and adjust as needed based  on BP readings + focus on DASH diet.  BMP today. Could consider discontinuation of HCTZ at upcoming visits, as BP well controlled and history of gout.  Return in 3 months.      Relevant Orders   Basic metabolic panel   Bayer DCA Hb A1c Waived     Respiratory   Obstructive sleep apnea    Continue nightly use, praised for this.        Endocrine   Hyperlipidemia associated with type 2 diabetes mellitus (HCC)    Chronic, stable.  Continue current medication regimen and adjust as needed.  Lipid panel today.  Recent LDL <70.      Relevant Orders   Bayer DCA Hb A1c Waived   Lipid Panel w/o Chol/HDL Ratio   Hyperthyroidism    Noted on past 2 labs, recheck level today.  Has known thyroid nodules and scheduled to see ENT for FNA x 2.  If ongoing abnormal levels discussed with patient will refer her to endocrinology for further assessment.      Relevant Orders   T4, free   Multiple thyroid nodules    Noted on imaging 03/27/21, will plan for repeat imaging 03/27/22.  At this time is scheduled for ENT and FNA upcoming in 2 weeks, review notes once available.       Relevant Orders   T4, free   TSH   Type 2 diabetes mellitus with diabetic mononeuropathy, without long-term current use of insulin (HCC) - Primary    Chronic, ongoing.  A1C 6.3% today, staying stable, praised for this and urine ALB 10 last visit.  Will continue Metformin XR 1000 MG BID and discussed with her after holidays may reduce dosing on this due to good control ongoing.  Focus on diabetic diet at home and regular exercise.  Continue Gabapentin daily for neuropathy, continue 900 MG at HS and 300 MG in morning + 300 MG at evening time.  Current CrCl = 76 based on recent labs.   Recommend she monitor feet closely for any wounds  or skin breakdown, alert provider immediately if present.  Return in 3 months.      Relevant Orders   Basic metabolic panel   Bayer DCA Hb A1c Waived     Other   Morbid obesity ([place])    BMI 37.48.  Recommended eating smaller high protein, low fat meals more frequently and exercising 30 mins a day 5 times a week with a goal of 10-15lb weight loss in the next 3 months. Patient voiced their understanding and motivation to adhere to these recommendations.         Follow up plan: Return in about 3 months (around 08/15/2021) for T2DM, HTN/HLD, GOUT, THYROID.

## 2021-05-17 NOTE — Assessment & Plan Note (Signed)
Continue nightly use, praised for this.

## 2021-05-17 NOTE — Assessment & Plan Note (Signed)
Noted on imaging 03/27/21, will plan for repeat imaging 03/27/22.  At this time is scheduled for ENT and FNA upcoming in 2 weeks, review notes once available.

## 2021-05-17 NOTE — Assessment & Plan Note (Signed)
Noted on past 2 labs, recheck level today.  Has known thyroid nodules and scheduled to see ENT for FNA x 2.  If ongoing abnormal levels discussed with patient will refer her to endocrinology for further assessment.

## 2021-05-17 NOTE — Assessment & Plan Note (Signed)
Chronic, ongoing.  A1C 6.3% today, staying stable, praised for this and urine ALB 10 last visit.  Will continue Metformin XR 1000 MG BID and discussed with her after holidays may reduce dosing on this due to good control ongoing.  Focus on diabetic diet at home and regular exercise.  Continue Gabapentin daily for neuropathy, continue 900 MG at HS and 300 MG in morning + 300 MG at evening time.  Current CrCl = 76 based on recent labs.   Recommend she monitor feet closely for any wounds or skin breakdown, alert provider immediately if present.  Return in 3 months.

## 2021-05-17 NOTE — Assessment & Plan Note (Signed)
Chronic, stable with BP below goal on home readings and at goal in office. Continue current medication regimen and adjust as needed based on BP readings + focus on DASH diet.  BMP today. Could consider discontinuation of HCTZ at upcoming visits, as BP well controlled and history of gout.  Return in 3 months.

## 2021-05-17 NOTE — Assessment & Plan Note (Signed)
Chronic, stable.  Continue current medication regimen and adjust as needed.  Lipid panel today.  Recent LDL <70.

## 2021-05-18 ENCOUNTER — Encounter: Payer: Self-pay | Admitting: Nurse Practitioner

## 2021-05-18 LAB — BASIC METABOLIC PANEL
BUN/Creatinine Ratio: 25 (ref 12–28)
BUN: 13 mg/dL (ref 8–27)
CO2: 26 mmol/L (ref 20–29)
Calcium: 9.9 mg/dL (ref 8.7–10.3)
Chloride: 98 mmol/L (ref 96–106)
Creatinine, Ser: 0.52 mg/dL — ABNORMAL LOW (ref 0.57–1.00)
Glucose: 162 mg/dL — ABNORMAL HIGH (ref 70–99)
Potassium: 4 mmol/L (ref 3.5–5.2)
Sodium: 140 mmol/L (ref 134–144)
eGFR: 98 mL/min/{1.73_m2} (ref >59)

## 2021-05-18 LAB — T4, FREE: Free T4: 0.97 ng/dL (ref 0.82–1.77)

## 2021-05-18 LAB — LIPID PANEL W/O CHOL/HDL RATIO
Cholesterol, Total: 120 mg/dL (ref 100–199)
HDL: 32 mg/dL — ABNORMAL LOW (ref >39)
LDL Chol Calc (NIH): 57 mg/dL (ref 0–99)
Triglycerides: 183 mg/dL — ABNORMAL HIGH (ref 0–149)
VLDL Cholesterol Cal: 31 mg/dL (ref 5–40)

## 2021-05-18 LAB — TSH: TSH: 0.452 u[IU]/mL (ref 0.450–4.500)

## 2021-05-18 NOTE — Progress Notes (Signed)
Contacted via [place] [facility] [place], your labs have returned: - Kidney function, creatinine and eGFR, remains normal, as are electrolytes. - Thyroid labs, both TSH and Free T4, are normal this check.  However, keep visit with ENT so we can ensure the nodules are okay.  I am thinking positive thoughts though on these:) - Cholesterol labs remain stable, continue medication.  Any questions? Keep being amazing!!  Thank you for allowing me to participate in your care.  I appreciate you. Kindest regards, Adasia Hoar

## 2021-05-31 DIAGNOSIS — E041 Nontoxic single thyroid nodule: Secondary | ICD-10-CM | POA: Diagnosis not present

## 2021-06-06 ENCOUNTER — Other Ambulatory Visit: Payer: Self-pay | Admitting: Otolaryngology

## 2021-06-06 DIAGNOSIS — E041 Nontoxic single thyroid nodule: Secondary | ICD-10-CM

## 2021-06-15 ENCOUNTER — Ambulatory Visit
Admission: RE | Admit: 2021-06-15 | Discharge: 2021-06-15 | Disposition: A | Discharge status: Home / Self Care | Payer: Medicare Other | Source: Ambulatory Visit | Attending: Otolaryngology | Admitting: Otolaryngology

## 2021-06-15 DIAGNOSIS — E042 Nontoxic multinodular goiter: Secondary | ICD-10-CM | POA: Insufficient documentation

## 2021-06-15 DIAGNOSIS — E041 Nontoxic single thyroid nodule: Secondary | ICD-10-CM

## 2021-06-15 NOTE — Procedures (Signed)
Ultrasound-guided FNA x 6 of right mid and x 4 of right lower pole thyroid nodules performed via 25-gauge needles.  Medication used- 1% lidocaine to skin and subcutaneous tissue.  No immediate complications. EBL< 2 cc.

## 2021-06-16 LAB — CYTOLOGY - NON PAP

## 2021-06-30 ENCOUNTER — Other Ambulatory Visit: Payer: Self-pay | Admitting: Nurse Practitioner

## 2021-06-30 MED ORDER — TRUE METRIX BLOOD GLUCOSE TEST VI STRP: ORAL_STRIP | 12 refills | Status: DC

## 2021-06-30 MED ORDER — TRUE METRIX AIR GLUCOSE METER W/DEVICE KIT: PACK | 2 refills | Status: DC

## 2021-07-04 ENCOUNTER — Other Ambulatory Visit: Payer: Self-pay | Admitting: Nurse Practitioner

## 2021-07-04 MED ORDER — TRUEPLUS LANCETS 33G MISC: 6 refills | Status: AC

## 2021-07-04 MED ORDER — TRUE METRIX AIR GLUCOSE METER W/DEVICE KIT: PACK | 2 refills | Status: AC

## 2021-07-04 MED ORDER — TRUE METRIX BLOOD GLUCOSE TEST VI STRP: ORAL_STRIP | 12 refills | Status: DC

## 2021-07-06 ENCOUNTER — Encounter: Payer: Self-pay | Admitting: Nurse Practitioner

## 2021-07-10 ENCOUNTER — Ambulatory Visit (INDEPENDENT_AMBULATORY_CARE_PROVIDER_SITE_OTHER): Payer: No Typology Code available for payment source | Admitting: Nurse Practitioner

## 2021-07-10 ENCOUNTER — Encounter: Payer: Self-pay | Admitting: Nurse Practitioner

## 2021-07-10 ENCOUNTER — Other Ambulatory Visit: Payer: Self-pay

## 2021-07-10 VITALS — BP 131/79 | HR 88 | Temp 99.0°F | Wt 213.6 lb

## 2021-07-10 DIAGNOSIS — G4733 Obstructive sleep apnea (adult) (pediatric): Secondary | ICD-10-CM | POA: Diagnosis not present

## 2021-07-10 NOTE — Assessment & Plan Note (Addendum)
Chronic, ongoing.  Continue nightly use, praised for this.  Will order new supplies.

## 2021-07-10 NOTE — Patient Instructions (Signed)
Living With Sleep Apnea Sleep apnea is a condition in which breathing pauses or becomes shallow during sleep. Sleep apnea is most commonly caused by a collapsed or blocked airway. People with sleep apnea usually snore loudly. They may have times when they gasp and stop breathing for 10 seconds or more during sleep. This may happen many times during the night. The breaks in breathing also interrupt the deep sleep that you need to feel rested. Even if you do not completely wake up from the gaps in breathing, your sleep may not be restful and you feel tired during the day. You may also have a headache in the morning and low energy during the day, and you may feel anxious or depressed. How can sleep apnea affect me? Sleep apnea increases your chances of extreme tiredness during the day (daytime fatigue). It can also increase your risk for health conditions, such as: Heart attack. Stroke. Obesity. Type 2 diabetes. Heart failure. Irregular heartbeat. High blood pressure. If you have daytime fatigue as a result of sleep apnea, you may be more likely to: Perform poorly at school or work. Fall asleep while driving. Have difficulty with attention. Develop depression or anxiety. Have sexual dysfunction. What actions can I take to manage sleep apnea? Sleep apnea treatment  If you were given a device to open your airway while you sleep, use it only as told by your health care provider. You may be given: An oral appliance. This is a custom-made mouthpiece that shifts your lower jaw forward. A continuous positive airway pressure (CPAP) device. This device blows air through a mask when you breathe out (exhale). A nasal expiratory positive airway pressure (EPAP) device. This device has valves that you put into each nostril. A bi-level positive airway pressure (BIPAP) device. This device blows air through a mask when you breathe in (inhale) and breathe out (exhale). You may need surgery if other treatments  do not work for you. Sleep habits Go to sleep and wake up at the same time every day. This helps set your internal clock (circadian rhythm) for sleeping. If you stay up later than usual, such as on weekends, try to get up in the morning within 2 hours of your normal wake time. Try to get at least 7-9 hours of sleep each night. Stop using a computer, tablet, and mobile phone a few hours before bedtime. Do not take long naps during the day. If you nap, limit it to 30 minutes. Have a relaxing bedtime routine. Reading or listening to music may relax you and help you sleep. Use your bedroom only for sleep. Keep your television and computer out of your bedroom. Keep your bedroom cool, dark, and quiet. Use a supportive mattress and pillows. Follow your health care provider's instructions for other changes to sleep habits. Nutrition Do not eat heavy meals in the evening. Do not have caffeine in the later part of the day. The effects of caffeine can last for more than 5 hours. Follow your health care provider's or dietitian's instructions for any diet changes. Lifestyle   Do not drink alcohol before bedtime. Alcohol can cause you to fall asleep at first, but then it can cause you to wake up in the middle of the night and have trouble getting back to sleep. Do not use any products that contain nicotine or tobacco. These products include cigarettes, chewing tobacco, and vaping devices, such as e-cigarettes. If you need help quitting, ask your health care provider. Medicines Take over-the-counter and   prescription medicines only as told by your health care provider. Do not use over-the-counter sleep medicine. You can become dependent on this medicine, and it can make sleep apnea worse. Do not use medicines, such as sedatives and narcotics, unless told by your health care provider. Activity Exercise on most days, but avoid exercising in the evening. Exercising near bedtime can interfere with  sleeping. If possible, spend time outside every day. Natural light helps regulate your circadian rhythm. General information Lose weight if you need to, and maintain a healthy weight. Keep all follow-up visits. This is important. If you are having surgery, make sure to tell your health care provider that you have sleep apnea. You may need to bring your device with you. Where to find more information Learn more about sleep apnea and daytime fatigue from: American Sleep Association: sleepassociation.org National Sleep Foundation: sleepfoundation.org National Heart, Lung, and Blood Institute: nhlbi.nih.gov Summary Sleep apnea is a condition in which breathing pauses or becomes shallow during sleep. Sleep apnea can cause daytime fatigue and other serious health conditions. You may need to wear a device while sleeping to help keep your airway open. If you are having surgery, make sure to tell your health care provider that you have sleep apnea. You may need to bring your device with you. Making changes to sleep habits, diet, lifestyle, and activity can help you manage sleep apnea. This information is not intended to replace advice given to you by your health care provider. Make sure you discuss any questions you have with your health care provider. Document Revised: 01/11/2021 Document Reviewed: 05/13/2020 Elsevier Patient Education  2022 Elsevier Inc.  

## 2021-07-10 NOTE — Progress Notes (Signed)
BP 131/79    Pulse 88    Temp 99 F (37.2 C)    Wt 213 lb 9.6 oz (96.9 kg)    LMP  (LMP Unknown)    SpO2 96%    BMI 37.84 kg/m    Subjective:    [name], female    DOB: 1945/09/08, 76 y.o.   MRN: [id number]  HPI: Catherine Hinton is a 76 y.o. female  Chief Complaint  Patient presents with   Obstructive Sleep Apnea    Patient is here to discuss a new C-Pap Machine. Patient states she received a new machine back in July and was informed by her insurance that she would need to return the machine due to insurance. Patient states she has to return the machine by tomorrow.    SLEEP APNEA Received new CPAP from [facility] in July 2022 and was told she would need to return it due to insurance or pay for it out of pocket == she would owe $1200.  [facility] does not take her insurance anymore.    She has a CPAP that is 74 years old -- last sleep study was 18 years ago. Sleep apnea status: stable Duration: chronic Satisfied with current treatment?:  yes CPAP use:  yes Sleep quality with CPAP use: excellent Treament compliance:good compliance Last sleep study: 18 years ago Treatments attempted: CPAP Wakes feeling refreshed:  yes Daytime hypersomnolence:  no Fatigue:  no Insomnia:  no Good sleep hygiene:  yes Difficulty falling asleep:  no Difficulty staying asleep:  no Obesity:  yes Hypertension: yes  Pulmonary hypertension:  no Coronary artery disease:  no   Relevant past medical, surgical, family and social history reviewed and updated as indicated. Interim medical history since our last visit reviewed. Allergies and medications reviewed and updated.  Review of Systems  Constitutional:  Negative for activity change, appetite change, diaphoresis, fatigue and fever.  Respiratory:  Negative for cough, chest tightness and shortness of breath.   Cardiovascular:  Negative for chest pain, palpitations and leg swelling.  Gastrointestinal: Negative.    Neurological: Negative.   Psychiatric/Behavioral: Negative.     Per HPI unless specifically indicated above     Objective:    BP 131/79    Pulse 88    Temp 99 F (37.2 C)    Wt 213 lb 9.6 oz (96.9 kg)    LMP  (LMP Unknown)    SpO2 96%    BMI 37.84 kg/m   Wt Readings from Last 3 Encounters:  07/10/21 213 lb 9.6 oz (96.9 kg)  05/17/21 211 lb 9.6 oz (96 kg)  04/17/21 210 lb (95.3 kg)    Physical Exam Vitals and nursing note reviewed.  Constitutional:      General: She is awake. She is not in acute distress.    Appearance: She is well-developed. She is obese. She is not ill-appearing.  HENT:     Head: Normocephalic.     Right Ear: Hearing, tympanic membrane and ear canal normal. No drainage.     Left Ear: Hearing, tympanic membrane, ear canal and external ear normal. No drainage.     Nose: Nose normal.     Mouth/Throat:     Mouth: Mucous membranes are moist.     Pharynx: Oropharynx is clear.  Eyes:     General: Lids are normal.        Right eye: No discharge.        Left eye: No discharge.  Conjunctiva/sclera: Conjunctivae normal.     Pupils: Pupils are equal, round, and reactive to light.  Neck:     Vascular: No carotid bruit.  Cardiovascular:     Rate and Rhythm: Normal rate and regular rhythm.     Heart sounds: Normal heart sounds. No murmur heard.   No gallop.  Pulmonary:     Effort: Pulmonary effort is normal. No accessory muscle usage or respiratory distress.     Breath sounds: Normal breath sounds.  Abdominal:     General: Bowel sounds are normal. There is no distension.     Palpations: Abdomen is soft. There is no hepatomegaly.     Tenderness: There is no abdominal tenderness. There is no right CVA tenderness or left CVA tenderness.  Musculoskeletal:     Cervical back: Normal range of motion and neck supple.     Right lower leg: No edema.     Left lower leg: No edema.  Lymphadenopathy:     Cervical: No cervical adenopathy.  Skin:    General: Skin is  warm and dry.  Neurological:     Mental Status: She is alert and oriented to person, place, and time.  Psychiatric:        Attention and Perception: Attention normal.        Mood and Affect: Mood normal.        Behavior: Behavior normal. Behavior is cooperative.        Thought Content: Thought content normal.        Judgment: Judgment normal.   Results for orders placed or performed during the hospital encounter of 06/15/21  Cytology - Non PAP;  Result Value Ref Range   CYTOLOGY - NON GYN      CYTOLOGY - NON PAP CASE: [id number] PATIENT: Catherine Hinton Non-Gynecological Cytology Report     Specimen Submitted: A. Thyroid, right mid; FNA  Clinical History: 2 cm mid RT thyroid nodule (solid/cystic)      DIAGNOSIS: A. THYROID, RIGHT MID; ULTRASOUND-GUIDED FINE NEEDLE ASPIRATION: - NON-DIAGNOSTIC (BETHESDA CATEGORY 1). - INSUFFICIENT FOLLICULAR EPITHELIUM FOR EVALUATION. - COLLOID AND CYST CONTENTS ARE PRESENT.   GROSS DESCRIPTION: A. Site: Mid thyroid (right) Procedure: Ultrasound-guided FNA Cytotechnologist(s): Sarah Fuel; Alwyn Pea  Specimen material collected and submitted for: 5 diff Quik stained slides 5 Pap stained slides Material for ThyroSeq if needed, labeled with the patients name, date of birth, specimen site, and collection date ThinPrep: 1  Specimen description: Fixative: Cytoloyt Volume: 20 mL Color: Red Transparency: Cloudy Tissue fragments present: No  RB 06/15/2021   Final Diagnosis performed by Reche Dixon ie, MD.   Electronically signed 06/16/2021 1:02:37PM The electronic signature indicates that the named Attending Pathologist has evaluated the specimen Technical component performed at [facility], [address] [place], [place] [address] Lab: [phone] Dir: Rush Farmer, MD, MMM  Professional component performed at [facility], [facility], [place], [place], [place] [address] Lab: [phone] Dir:  Kathi Simpers, MD   Cytology - Non PAP;  Result Value Ref Range   CYTOLOGY - NON GYN      CYTOLOGY - NON PAP CASE: [id number] PATIENT: Catherine Hinton Non-Gynecological Cytology Report     Specimen Submitted: A. Thyroid, right inferior; FNA  Clinical History: 1.9 cm RT inferior thyroid nodule      DIAGNOSIS: A. THYROID, RIGHT INFERIOR; ULTRASOUND-GUIDED FINE NEEDLE ASPIRATION: - BENIGN (BETHESDA CATEGORY 2). - BENIGN FOLLICULAR EPITHELIAL CELLS IN A BACKGROUND OF COLLOID AND CYST CONTENTS.   GROSS DESCRIPTION: A. Site:  Inferior thyroid (right) Procedure: Ultrasound-guided FNA Cytotechnologist(s): Sarah Fuel; Alwyn Pea  Specimen material collected and submitted for: 3 diff Quik stained slides 3 Pap stained slides Material for ThyroSeq if needed, labeled with the patients name, date of birth, specimen site, and collection date ThinPrep: 1  Specimen description: Fixative: Cytoloyt Volume: 20 mL Color: Red Transparency: Slightly cloudy Tissue fragments present: No  RB 06/15/2021   Final Diagnosis performed by Betsy Pries,  MD.   Electronically signed 06/16/2021 1:13:49PM The electronic signature indicates that the named Attending Pathologist has evaluated the specimen Technical component performed at [facility], [address] [place], [place] [address] Lab: [phone] Dir: Rush Farmer, MD, MMM  Professional component performed at [facility], [facility], [place], [place], [place] [address] Lab: [phone] Dir: Kathi Simpers, MD       Assessment & Plan:   Problem List Items Addressed This Visit       Respiratory   Obstructive sleep apnea - Primary    Chronic, ongoing.  Continue nightly use, praised for this.  Will order new supplies.        Relevant Orders   For home use only DME continuous positive airway pressure (CPAP)     Follow up plan: Return for as scheduled in February.

## 2021-07-13 ENCOUNTER — Encounter: Payer: Self-pay | Admitting: Nurse Practitioner

## 2021-07-13 DIAGNOSIS — G4733 Obstructive sleep apnea (adult) (pediatric): Secondary | ICD-10-CM

## 2021-07-18 NOTE — Telephone Encounter (Signed)
Levada Dy with [facility] called saying [name] sent an order to them for the patient to get a C Pap machine.  They say that she is not a patient of theirs and they will need a copy of the diagnostic sleep study   CB#  [phone]  FAX # [phone]

## 2021-07-21 NOTE — Addendum Note (Signed)
Addended by: Marnee Guarneri T on: 07/21/2021 01:44 PM   Modules accepted: Orders

## 2021-07-27 ENCOUNTER — Other Ambulatory Visit: Payer: Self-pay | Admitting: Nurse Practitioner

## 2021-07-27 NOTE — Telephone Encounter (Signed)
Requested Prescriptions  Pending Prescriptions Disp Refills   metFORMIN (GLUCOPHAGE-XR) 500 MG 24 hr tablet [Pharmacy Med Name: METFORMIN HYDROCHLORIDE ER 500 MG Tablet Extended Release 24 Hour] 360 tablet     Sig: TAKE 2 TABLETS EVERY MORNING  AND TAKE 2 TABLETS AT BEDTIME     Endocrinology:  Diabetes - Biguanides Failed - 07/27/2021  1:39 PM      Failed - Cr in normal range and within 360 days    Creatinine, Ser  Date Value Ref Range Status  05/17/2021 0.52 (L) 0.57 - 1.00 mg/dL Final         Passed - HBA1C is between 0 and 7.9 and within 180 days    Hemoglobin A1C  Date Value Ref Range Status  11/06/2017 6.8  Final   HB A1C (BAYER DCA - WAIVED)  Date Value Ref Range Status  05/17/2021 6.3 (H) 4.8 - 5.6 % Final    Comment:             Prediabetes: 5.7 - 6.4          Diabetes: >6.4          Glycemic control for adults with diabetes: <7.0          Passed - eGFR in normal range and within 360 days    GFR calc Af Amer  Date Value Ref Range Status  07/12/2020 105 >59 mL/min/1.73 Final    Comment:    **In accordance with recommendations from the NKF-ASN Task force,**   [facility] is in the process of updating its eGFR calculation to the   2021 CKD-EPI creatinine equation that estimates kidney function   without a race variable.    GFR calc non Af Amer  Date Value Ref Range Status  07/12/2020 91 >59 mL/min/1.73 Final   eGFR  Date Value Ref Range Status  05/17/2021 98 >59 mL/min/1.73 Final         Passed - B12 Level in normal range and within 720 days    Vitamin B-12  Date Value Ref Range Status  10/10/2020 563 232 - 1,245 pg/mL Final         Passed - Valid encounter within last 6 months    Recent Outpatient Visits          2 weeks ago Obstructive sleep apnea   [place], [name] T, NP   2 months ago Type 2 diabetes mellitus with diabetic mononeuropathy, without long-term current use of insulin ([place])   [place], [place] T,  NP   4 months ago Dizziness   [place], Santiago Glad, NP   4 months ago Type 2 diabetes mellitus with diabetic mononeuropathy, without long-term current use of insulin ([place])   [place], [name] T, NP   5 months ago Type 2 diabetes mellitus with diabetic mononeuropathy, without long-term current use of insulin ([place])   [place], [place] T, NP      Future Appointments            In 2 weeks [place], Barbaraann Faster, NP [facility]   In 8 months  [facility]           Passed - CBC within normal limits and completed in the last 12 months    WBC  Date Value Ref Range Status  02/07/2021 5.6 3.4 - 10.8 x10E3/uL Final   RBC  Date Value Ref Range Status  02/07/2021 4.75 3.77 -  5.28 x10E6/uL Final   Hemoglobin  Date Value Ref Range Status  02/07/2021 13.3 11.1 - 15.9 g/dL Final   Hematocrit  Date Value Ref Range Status  02/07/2021 40.5 34.0 - 46.6 % Final   MCHC  Date Value Ref Range Status  02/07/2021 32.8 31.5 - 35.7 g/dL Final   [facility]  Date Value Ref Range Status  02/07/2021 28.0 26.6 - 33.0 pg Final   MCV  Date Value Ref Range Status  02/07/2021 85 79 - 97 fL Final   No results found for: PLTCOUNTKUC, LABPLAT, POCPLA RDW  Date Value Ref Range Status  02/07/2021 13.4 11.7 - 15.4 % Final

## 2021-08-02 ENCOUNTER — Encounter: Payer: Self-pay | Admitting: Nurse Practitioner

## 2021-08-13 NOTE — Patient Instructions (Signed)

## 2021-08-15 ENCOUNTER — Other Ambulatory Visit: Payer: Self-pay

## 2021-08-15 ENCOUNTER — Encounter: Payer: Self-pay | Admitting: Nurse Practitioner

## 2021-08-15 ENCOUNTER — Ambulatory Visit (INDEPENDENT_AMBULATORY_CARE_PROVIDER_SITE_OTHER): Payer: Medicare HMO | Admitting: Nurse Practitioner

## 2021-08-15 VITALS — BP 110/72 | HR 98 | Temp 99.1°F | Ht 63.0 in | Wt 213.6 lb

## 2021-08-15 DIAGNOSIS — I700 Atherosclerosis of aorta: Secondary | ICD-10-CM | POA: Diagnosis not present

## 2021-08-15 DIAGNOSIS — E042 Nontoxic multinodular goiter: Secondary | ICD-10-CM

## 2021-08-15 DIAGNOSIS — G4733 Obstructive sleep apnea (adult) (pediatric): Secondary | ICD-10-CM | POA: Diagnosis not present

## 2021-08-15 DIAGNOSIS — E1141 Type 2 diabetes mellitus with diabetic mononeuropathy: Secondary | ICD-10-CM

## 2021-08-15 DIAGNOSIS — E0590 Thyrotoxicosis, unspecified without thyrotoxic crisis or storm: Secondary | ICD-10-CM | POA: Diagnosis not present

## 2021-08-15 DIAGNOSIS — E785 Hyperlipidemia, unspecified: Secondary | ICD-10-CM | POA: Diagnosis not present

## 2021-08-15 DIAGNOSIS — E1169 Type 2 diabetes mellitus with other specified complication: Secondary | ICD-10-CM

## 2021-08-15 DIAGNOSIS — I152 Hypertension secondary to endocrine disorders: Secondary | ICD-10-CM

## 2021-08-15 DIAGNOSIS — E1159 Type 2 diabetes mellitus with other circulatory complications: Secondary | ICD-10-CM

## 2021-08-15 LAB — MICROALBUMIN, URINE WAIVED
Creatinine, Urine Waived: 50 mg/dL (ref 10–300)
Microalb, Ur Waived: 30 mg/L — ABNORMAL HIGH (ref 0–19)

## 2021-08-15 LAB — BAYER DCA HB A1C WAIVED: HB A1C (BAYER DCA - WAIVED): 6.5 % — ABNORMAL HIGH (ref 4.8–5.6)

## 2021-08-15 MED ORDER — DULOXETINE HCL 30 MG PO CPEP: ORAL_CAPSULE | ORAL | 4 refills | Status: DC

## 2021-08-15 MED ORDER — METOPROLOL SUCCINATE ER 25 MG PO TB24
25.0000 mg | ORAL_TABLET | Freq: Every day | ORAL | 4 refills | Status: DC
Start: 2021-08-15 — End: 2022-03-26

## 2021-08-15 MED ORDER — VALSARTAN-HYDROCHLOROTHIAZIDE 160-12.5 MG PO TABS: 1.0000 | ORAL_TABLET | Freq: Every day | ORAL | 4 refills | Status: DC

## 2021-08-15 NOTE — Assessment & Plan Note (Addendum)
Chronic, stable with BP below goal on home readings and at goal in office. Continue current medication regimen and adjust as needed based on BP readings + focus on DASH diet.  CMP today. Could consider discontinuation of HCTZ at upcoming visits, as BP well controlled and history of gout.  Return in 3 months.

## 2021-08-15 NOTE — Assessment & Plan Note (Signed)
BMI 37.84.  Recommended eating smaller high protein, low fat meals more frequently and exercising 30 mins a day 5 times a week with a goal of 10-15lb weight loss in the next 3 months. Patient voiced their understanding and motivation to adhere to these recommendations.

## 2021-08-15 NOTE — Assessment & Plan Note (Signed)
Noted on CT 09/18/19.  Continue to monitor and continue ASA + Lopid for prevention.

## 2021-08-15 NOTE — Progress Notes (Signed)
BP 110/72    Pulse 98    Temp 99.1 F (37.3 C) (Oral)    Ht 5\' 3"  (1.6 m)    Wt 213 lb 9.6 oz (96.9 kg)    LMP  (LMP Unknown)    SpO2 97%    BMI 37.84 kg/m    Subjective:    [name], female    DOB: [birth year], 76 y.o.   MRN: [id number]  HPI: Catherine Hinton is a 76 y.o. female  Chief Complaint  Patient presents with   Diabetes    Patient states she an upcoming Diabetic Eye Exam in March.    Hyperlipidemia   Hypertension   Hyperthyroidism   Peripheral Neuropathy   DIABETES Last A1c November 6.3%.  Continues on Metformin 1000 MG BID.   Hypoglycemic episodes:no Polydipsia/polyuria: no Visual disturbance: no Chest pain: no Paresthesias: no Glucose Monitoring: yes             Accucheck frequency: Daily             Fasting glucose: 113 this morning -- often range 94 to 130             Post prandial:             Evening:             Before meals: Taking Insulin?: no             Long acting insulin:             Short acting insulin: Blood Pressure Monitoring: daily Retinal Examination: Up to Date -- has glaucoma which she was taking Latanoprost for, but they took her off these -- Dr. [name], sees him in two weeks Foot Exam: Up to Date Pneumovax: Up to Date Influenza: Up to Date Aspirin: yes   NEUROPATHY Currently takes Gabapentin for neuropathy pain = 300 MG morning and evening, 900 MG at night.  She reports no benefit from this, continues to have issues with pain to feet and lower legs.  Current CrCl 147 based on recent labs. Neuropathy status: uncontrolled  Satisfied with current treatment?: yes Medication side effects: no Medication compliance:  good compliance Location: foot and lower legs Pain: yes Severity: 6/10 at worst, 3/10 at best Quality:  dull, aching, and pins and needles Frequency: intermittent Bilateral: yes Symmetric: yes Numbness: yes Decreased sensation: yes Weakness: no Context: fluctuating Alleviating factors:  nothing Aggravating factors: unknown Treatments attempted: Gabapentin and B12  HYPERTENSION / HYPERLIPIDEMIA Taking Metoprolol and Valsartan-HCTZ, ASA and Lopid.  Continues on 1000 units Vit D for Vit D deficiency.    Has CPAP and uses this nightly -- is working on Clinical cytogeneticist.  Insurance will not cover new equipment unless new sleep study, which we are working on.  She recently purchased new equipment out of pocket. Satisfied with current treatment? yes Duration of hypertension: chronic BP monitoring frequency: a few times a week BP range: usually <110/70 BP medication side effects: no Duration of hyperlipidemia: chronic Cholesterol medication side effects: no Cholesterol supplements: none Medication compliance: good compliance Aspirin: yes Recent stressors: no Recurrent headaches: no Visual changes: no Palpitations: no Dyspnea: no Chest pain: no Lower extremity edema: no Dizzy/lightheaded: no   HYPERTHYROID AND THYROID NODULE Had thyroid ultrasound 03/28/21 with nodule noted to right mid and inferior lobes that require biopsy + a nodule in isthmus that meets criteria for annual imaging for 5 years.  Had biopsy with ENT on 06/15/21 = this was benign.  Fatigue: no Cold intolerance: no Heat intolerance: no Weight gain: no Weight loss: no Constipation: yes Diarrhea/loose stools: no Palpitations: no Lower extremity edema: no Anxiety/depressed mood: yes Depression screen [facility] 2/9 08/15/2021 07/10/2021 05/17/2021 04/17/2021 02/07/2021  Decreased Interest 1 0 0 0 0  Down, Depressed, Hopeless 1 0 0 0 0  PHQ - 2 Score 2 0 0 0 0  Altered sleeping 3 1 1  - -  Tired, decreased energy 1 1 2  - -  Change in appetite 1 1 1  - -  Feeling bad or failure about yourself  0 1 0 - -  Trouble concentrating 0 0 0 - -  Moving slowly or fidgety/restless 0 0 0 - -  Suicidal thoughts 0 0 0 - -  PHQ-9 Score 7 4 4  - -  Difficult doing work/chores - - - - -  Some recent data might be hidden     GAD 7 : Generalized Anxiety Score 08/15/2021 07/10/2021 05/17/2021 05/05/2018  Nervous, Anxious, on Edge 1 1 1  0  Control/stop worrying 1 1 0 0  Worry too much - different things 1 0 0 0  Trouble relaxing 0 0 0 0  Restless 0 0 0 0  Easily annoyed or irritable 1 0 0 0  Afraid - awful might happen 0 0 0 0  Total GAD 7 Score 4 2 1  0  Anxiety Difficulty Somewhat difficult - Not difficult at all -   Relevant past medical, surgical, family and social history reviewed and updated as indicated. Interim medical history since our last visit reviewed. Allergies and medications reviewed and updated.  Review of Systems  Constitutional:  Negative for activity change, appetite change, diaphoresis, fatigue and fever.  Respiratory:  Negative for cough, chest tightness, shortness of breath and wheezing.   Cardiovascular:  Negative for chest pain, palpitations and leg swelling.  Gastrointestinal: Negative.   Endocrine: Negative for cold intolerance, heat intolerance, polydipsia, polyphagia and polyuria.  Genitourinary: Negative.   Neurological: Negative.   Psychiatric/Behavioral: Negative.     Per HPI unless specifically indicated above     Objective:    BP 110/72    Pulse 98    Temp 99.1 F (37.3 C) (Oral)    Ht 5\' 3"  (1.6 m)    Wt 213 lb 9.6 oz (96.9 kg)    LMP  (LMP Unknown)    SpO2 97%    BMI 37.84 kg/m   Wt Readings from Last 3 Encounters:  08/15/21 213 lb 9.6 oz (96.9 kg)  07/10/21 213 lb 9.6 oz (96.9 kg)  05/17/21 211 lb 9.6 oz (96 kg)    Physical Exam Vitals and nursing note reviewed.  Constitutional:      General: She is awake. She is not in acute distress.    Appearance: She is well-developed. She is obese. She is not ill-appearing.  HENT:     Head: Normocephalic.     Right Ear: Hearing, tympanic membrane and ear canal normal. No drainage.     Left Ear: Hearing, tympanic membrane, ear canal and external ear normal. No drainage.     Nose: Nose normal.     Mouth/Throat:      Mouth: Mucous membranes are moist.     Pharynx: Oropharynx is clear.  Eyes:     General: Lids are normal.        Right eye: No discharge.        Left eye: No discharge.     Conjunctiva/sclera: Conjunctivae normal.  Pupils: Pupils are equal, round, and reactive to light.  Neck:     Vascular: No carotid bruit.  Cardiovascular:     Rate and Rhythm: Normal rate and regular rhythm.     Heart sounds: Normal heart sounds. No murmur heard.   No gallop.  Pulmonary:     Effort: Pulmonary effort is normal. No accessory muscle usage or respiratory distress.     Breath sounds: Normal breath sounds.  Abdominal:     General: Bowel sounds are normal. There is no distension.     Palpations: Abdomen is soft. There is no hepatomegaly.     Tenderness: There is no abdominal tenderness. There is no right CVA tenderness or left CVA tenderness.  Musculoskeletal:     Cervical back: Normal range of motion and neck supple.     Right lower leg: No edema.     Left lower leg: No edema.  Lymphadenopathy:     Cervical: No cervical adenopathy.  Skin:    General: Skin is warm and dry.  Neurological:     Mental Status: She is alert and oriented to person, place, and time.  Psychiatric:        Attention and Perception: Attention normal.        Mood and Affect: Mood normal.        Behavior: Behavior normal. Behavior is cooperative.        Thought Content: Thought content normal.        Judgment: Judgment normal.   Results for orders placed or performed in visit on 08/15/21  Bayer DCA Hb A1c Waived  Result Value Ref Range   HB A1C (BAYER DCA - WAIVED) 6.5 (H) 4.8 - 5.6 %  Microalbumin, Urine Waived  Result Value Ref Range   Microalb, Ur Waived 30 (H) 0 - 19 mg/L   Creatinine, Urine Waived 50 10 - 300 mg/dL   Microalb/Creat Ratio 30-300 (H) <30 mg/g      Assessment & Plan:   Problem List Items Addressed This Visit       Cardiovascular and Mediastinum   Aortic atherosclerosis ([place])    Noted on CT  09/18/19.  Continue to monitor and continue ASA + Lopid for prevention.      Relevant Medications   valsartan-hydrochlorothiazide (DIOVAN-HCT) 160-12.5 MG tablet   metoprolol succinate (TOPROL-XL) 25 MG 24 hr tablet   Other Relevant Orders   Comprehensive metabolic panel   Lipid Panel w/o Chol/HDL Ratio   Hypertension associated with diabetes (HCC)    Chronic, stable with BP below goal on home readings and at goal in office. Continue current medication regimen and adjust as needed based on BP readings + focus on DASH diet.  CMP today. Could consider discontinuation of HCTZ at upcoming visits, as BP well controlled and history of gout.  Return in 3 months.      Relevant Medications   valsartan-hydrochlorothiazide (DIOVAN-HCT) 160-12.5 MG tablet   metoprolol succinate (TOPROL-XL) 25 MG 24 hr tablet   Other Relevant Orders   Bayer DCA Hb A1c Waived (Completed)   Microalbumin, Urine Waived (Completed)   Comprehensive metabolic panel     Respiratory   Obstructive sleep apnea    Continue nightly use, praised for this. Currently working on new equipment.        Endocrine   Hyperlipidemia associated with type 2 diabetes mellitus (HCC)    Chronic, stable.  Continue current medication regimen and adjust as needed.  Lipid panel today.  Recent LDL <70.  Relevant Medications   valsartan-hydrochlorothiazide (DIOVAN-HCT) 160-12.5 MG tablet   metoprolol succinate (TOPROL-XL) 25 MG 24 hr tablet   Other Relevant Orders   Bayer DCA Hb A1c Waived (Completed)   Comprehensive metabolic panel   Lipid Panel w/o Chol/HDL Ratio   Hyperthyroidism    Noted on past labs, recheck level today.  Has known thyroid nodules and with benign biopsy recently with ENT.  Consider referral to endocrinology for further assessment if ongoing low levels.      Relevant Medications   metoprolol succinate (TOPROL-XL) 25 MG 24 hr tablet   Other Relevant Orders   T4, free   TSH   Multiple thyroid nodules    Noted  on imaging 03/27/21, will plan for repeat imaging 03/27/22.  Recent biopsy was benign with ENT.      Relevant Medications   metoprolol succinate (TOPROL-XL) 25 MG 24 hr tablet   Other Relevant Orders   T4, free   TSH   Type 2 diabetes mellitus with diabetic mononeuropathy, without long-term current use of insulin (HCC) - Primary    Chronic, ongoing.  A1C 6.5% today, staying stable, praised for this and urine ALB 30 today (February 2023).  Will continue Metformin XR 1000 MG BID and discussed with her after holidays may reduce dosing in future.  Focus on diabetic diet at home and regular exercise.  Continue Gabapentin daily for neuropathy, continue 900 MG at HS and 300 MG in morning + 300 MG at evening time + add on Duloxetine 30 MG daily and may increase to 60 MG in one week if tolerating.  Current CrCl = 147 based on recent labs.   Recommend she monitor feet closely for any wounds or skin breakdown, alert provider immediately if present.  Return in 4 weeks.      Relevant Medications   DULoxetine (CYMBALTA) 30 MG capsule   valsartan-hydrochlorothiazide (DIOVAN-HCT) 160-12.5 MG tablet   Other Relevant Orders   Bayer DCA Hb A1c Waived (Completed)   Microalbumin, Urine Waived (Completed)     Other   Morbid obesity (HCC)    BMI 37.84.  Recommended eating smaller high protein, low fat meals more frequently and exercising 30 mins a day 5 times a week with a goal of 10-15lb weight loss in the next 3 months. Patient voiced their understanding and motivation to adhere to these recommendations.         Follow up plan: Return in about 4 weeks (around 09/12/2021) for NEUROPATHY.

## 2021-08-15 NOTE — Assessment & Plan Note (Signed)
Chronic, ongoing.  A1C 6.5% today, staying stable, praised for this and urine ALB 30 today (February 2023).  Will continue Metformin XR 1000 MG BID and discussed with her after holidays may reduce dosing in future.  Focus on diabetic diet at home and regular exercise.  Continue Gabapentin daily for neuropathy, continue 900 MG at HS and 300 MG in morning + 300 MG at evening time + add on Duloxetine 30 MG daily and may increase to 60 MG in one week if tolerating.  Current CrCl = 147 based on recent labs.   Recommend she monitor feet closely for any wounds or skin breakdown, alert provider immediately if present.  Return in 4 weeks.

## 2021-08-15 NOTE — Assessment & Plan Note (Signed)
Continue nightly use, praised for this. Currently working on new equipment.

## 2021-08-15 NOTE — Assessment & Plan Note (Signed)
Noted on imaging 03/27/21, will plan for repeat imaging 03/27/22.  Recent biopsy was benign with ENT.

## 2021-08-15 NOTE — Assessment & Plan Note (Signed)
Chronic, stable.  Continue current medication regimen and adjust as needed.  Lipid panel today.  Recent LDL <70.

## 2021-08-15 NOTE — Assessment & Plan Note (Signed)
Noted on past labs, recheck level today.  Has known thyroid nodules and with benign biopsy recently with ENT.  Consider referral to endocrinology for further assessment if ongoing low levels.

## 2021-08-16 LAB — TSH: TSH: 0.414 u[IU]/mL — ABNORMAL LOW (ref 0.450–4.500)

## 2021-08-16 LAB — COMPREHENSIVE METABOLIC PANEL
ALT: 13 IU/L (ref 0–32)
AST: 15 IU/L (ref 0–40)
Albumin/Globulin Ratio: 2.1 (ref 1.2–2.2)
Albumin: 4.4 g/dL (ref 3.7–4.7)
Alkaline Phosphatase: 44 IU/L (ref 44–121)
BUN/Creatinine Ratio: 18 (ref 12–28)
BUN: 11 mg/dL (ref 8–27)
Bilirubin Total: 0.3 mg/dL (ref 0.0–1.2)
CO2: 26 mmol/L (ref 20–29)
Calcium: 9.8 mg/dL (ref 8.7–10.3)
Chloride: 99 mmol/L (ref 96–106)
Creatinine, Ser: 0.62 mg/dL (ref 0.57–1.00)
Globulin, Total: 2.1 g/dL (ref 1.5–4.5)
Glucose: 194 mg/dL — ABNORMAL HIGH (ref 70–99)
Potassium: 4.4 mmol/L (ref 3.5–5.2)
Sodium: 142 mmol/L (ref 134–144)
Total Protein: 6.5 g/dL (ref 6.0–8.5)
eGFR: 94 mL/min/{1.73_m2} (ref >59)

## 2021-08-16 LAB — T4, FREE: Free T4: 1.05 ng/dL (ref 0.82–1.77)

## 2021-08-16 LAB — LIPID PANEL W/O CHOL/HDL RATIO
Cholesterol, Total: 137 mg/dL (ref 100–199)
HDL: 34 mg/dL — ABNORMAL LOW (ref >39)
LDL Chol Calc (NIH): 74 mg/dL (ref 0–99)
Triglycerides: 169 mg/dL — ABNORMAL HIGH (ref 0–149)
VLDL Cholesterol Cal: 29 mg/dL (ref 5–40)

## 2021-08-16 NOTE — Progress Notes (Signed)
Contacted via [place], your labs have returned. Kidney function, creatinine and eGFR, remains normal, as is liver function, AST and ALT.  Cholesterol labs show LDL at goal.  Thyroid labs show TSH slightly on lower (hyperthyroid) side again, however Free T4 normal = we will recheck this next visit and if any decline may get you into endocrinology.  Any questions? Keep being stellar!!  Thank you for allowing me to participate in your care.  I appreciate you. Kindest regards, Ahan Eisenberger

## 2021-08-17 ENCOUNTER — Telehealth: Payer: Self-pay

## 2021-08-17 NOTE — Chronic Care Management (AMB) (Signed)
Marked as error ? ?

## 2021-08-29 DIAGNOSIS — H02883 Meibomian gland dysfunction of right eye, unspecified eyelid: Secondary | ICD-10-CM | POA: Diagnosis not present

## 2021-08-29 DIAGNOSIS — H26492 Other secondary cataract, left eye: Secondary | ICD-10-CM | POA: Diagnosis not present

## 2021-08-29 DIAGNOSIS — H04129 Dry eye syndrome of unspecified lacrimal gland: Secondary | ICD-10-CM | POA: Diagnosis not present

## 2021-08-29 DIAGNOSIS — H353131 Nonexudative age-related macular degeneration, bilateral, early dry stage: Secondary | ICD-10-CM | POA: Diagnosis not present

## 2021-08-29 DIAGNOSIS — E119 Type 2 diabetes mellitus without complications: Secondary | ICD-10-CM | POA: Diagnosis not present

## 2021-08-29 DIAGNOSIS — H02886 Meibomian gland dysfunction of left eye, unspecified eyelid: Secondary | ICD-10-CM | POA: Diagnosis not present

## 2021-08-29 DIAGNOSIS — H40013 Open angle with borderline findings, low risk, bilateral: Secondary | ICD-10-CM | POA: Diagnosis not present

## 2021-08-29 LAB — HM DIABETES EYE EXAM

## 2021-09-10 NOTE — Patient Instructions (Signed)
Neuropathic Pain °Neuropathic pain is pain caused by damage to the nerves that are responsible for certain sensations in your body (sensory nerves). °Neuropathic pain can make you more sensitive to pain. Even a minor sensation can feel very painful. This is usually a long-term (chronic) condition that can be difficult to treat. The type of pain differs from person to person. It may: °Start suddenly (acute), or it may develop slowly and become chronic. °Come and go as damaged nerves heal, or it may stay at the same level for years. °Cause emotional distress, loss of sleep, and a lower quality of life. °What are the causes? °The most common cause of this condition is diabetes. Many other diseases and conditions can also cause neuropathic pain. Causes of neuropathic pain can be classified as: °Toxic. This is caused by medicines and chemicals. The most common causes of toxic neuropathic pain is damage from medicines that kill cancer cells (chemotherapy) or alcohol abuse. °Metabolic. This can be caused by: °Diabetes. °Lack of vitamins like B12. °Traumatic. Any injury that cuts, crushes, or stretches a nerve can cause damage and pain. °Compression-related. If a sensory nerve gets trapped or compressed for a long period of time, the blood supply to the nerve can be cut off. °Vascular. Many blood vessel diseases can cause neuropathic pain by decreasing blood supply and oxygen to nerves. °Autoimmune. This type of pain results from diseases in which the body's defense system (immune system) mistakenly attacks sensory nerves. Examples of autoimmune diseases that can cause neuropathic pain include lupus and multiple sclerosis. °Infectious. Many types of viral infections can damage sensory nerves and cause pain. Shingles infection is a common cause of this type of pain. °Inherited. Neuropathic pain can be a symptom of many diseases that are passed down through families (genetic). °What increases the risk? °You are more likely to  develop this condition if: °You have diabetes. °You smoke. °You drink too much alcohol. °You are taking certain medicines, including chemotherapy or medicines that treat immune system disorders. °What are the signs or symptoms? °The main symptom is pain. Neuropathic pain is often described as: °Burning. °Shock-like. °Stinging. °Hot or cold. °Itching. °How is this diagnosed? °No single test can diagnose neuropathic pain. It is diagnosed based on: °A physical exam and your symptoms. Your health care provider will ask you about your pain. You may be asked to use a pain scale to describe how bad your pain is. °Tests. These may be done to see if you have a cause and location of any nerve damage. They include: °Nerve conduction studies and electromyography to test how well nerve signals travel through your nerves and muscles (electrodiagnostic testing). °Skin biopsy to evaluate for small fiber neuropathy. °Imaging studies, such as: °X-rays. °CT scan. °MRI. °How is this treated? °Treatment for neuropathic pain may change over time. You may need to try different treatment options or a combination of treatments. Some options include: °Treating the underlying cause of the neuropathy, such as diabetes, kidney disease, or vitamin deficiencies. °Stopping medicines that can cause neuropathy, such as chemotherapy. °Medicine to relieve pain. Medicines may include: °Prescription or over-the-counter pain medicine. °Anti-seizure medicine. °Antidepressant medicines. °Pain-relieving patches or creams that are applied to painful areas of skin. °A medicine to numb the area (local anesthetic), which can be injected as a nerve block. °Transcutaneous nerve stimulation. This uses electrical currents to block painful nerve signals. The treatment is painless. °Alternative treatments, such as: °Acupuncture. °Meditation. °Massage. °Occupational or physical therapy. °Pain management programs. °Counseling. °Follow   these instructions at  home: °Medicines ° °Take over-the-counter and prescription medicines only as told by your health care provider. °Ask your health care provider if the medicine prescribed to you: °Requires you to avoid driving or using machinery. °Can cause constipation. You may need to take these actions to prevent or treat constipation: °Drink enough fluid to keep your urine pale yellow. °Take over-the-counter or prescription medicines. °Eat foods that are high in fiber, such as beans, whole grains, and fresh fruits and vegetables. °Limit foods that are high in fat and processed sugars, such as fried or sweet foods. °Lifestyle ° °Have a good support system at home. °Consider joining a chronic pain support group. °Do not use any products that contain nicotine or tobacco. These products include cigarettes, chewing tobacco, and vaping devices, such as e-cigarettes. If you need help quitting, ask your health care provider. °Do not drink alcohol. °General instructions °Learn as much as you can about your condition. °Work closely with all your health care providers to find the treatment plan that works best for you. °Ask your health care provider what activities are safe for you. °Keep all follow-up visits. This is important. °Contact a health care provider if: °Your pain treatments are not working. °You are having side effects from your medicines. °You are struggling with tiredness (fatigue), mood changes, depression, or anxiety. °Get help right away if: °You have thoughts of hurting yourself. °Get help right away if you feel like you may hurt yourself or others, or have thoughts about taking your own life. Go to your nearest emergency room or: °Call 911. °Call the National Suicide Prevention Lifeline at 1-800-273-8255 or 988. This is open 24 hours a day. °Text the Crisis Text Line at 741741. °Summary °Neuropathic pain is pain caused by damage to the nerves that are responsible for certain sensations in your body (sensory  nerves). °Neuropathic pain may come and go as damaged nerves heal, or it may stay at the same level for years. °Neuropathic pain is usually a long-term condition that can be difficult to treat. Consider joining a chronic pain support group. °This information is not intended to replace advice given to you by your health care provider. Make sure you discuss any questions you have with your health care provider. °Document Revised: 01/30/2021 Document Reviewed: 01/30/2021 °Elsevier Patient Education © 2022 [employer] ° °

## 2021-09-12 ENCOUNTER — Other Ambulatory Visit: Payer: Self-pay

## 2021-09-12 ENCOUNTER — Encounter: Payer: Self-pay | Admitting: Nurse Practitioner

## 2021-09-12 ENCOUNTER — Ambulatory Visit (INDEPENDENT_AMBULATORY_CARE_PROVIDER_SITE_OTHER): Payer: Medicare HMO | Admitting: Nurse Practitioner

## 2021-09-12 DIAGNOSIS — E1141 Type 2 diabetes mellitus with diabetic mononeuropathy: Secondary | ICD-10-CM | POA: Diagnosis not present

## 2021-09-12 NOTE — Assessment & Plan Note (Addendum)
Chronic, ongoing.  A1c at recent visit 6.5%, staying stable, praised for this and urine ALB 30 today (February 2023).   ?- Will continue Metformin XR 1000 MG BID. ?- Focus on diabetic diet at home and regular exercise.   ?- Continue Gabapentin daily for neuropathy, continue 900 MG at HS and 300 MG in morning + 300 MG at evening time + Duloxetine 60 MG daily as is offering benefit.  Current CrCl = 147 based on recent labs.    ?- Recommend she monitor feet closely for any wounds or skin breakdown, alert provider immediately if present.   ?- Return in September for physical. ?

## 2021-09-12 NOTE — Progress Notes (Signed)
? ?BP 109/68   Pulse 99   Temp 98.7 ?F (37.1 ?C)   Ht '5\' 3"'  (1.6 m)   Wt 211 lb (95.7 kg)   LMP  (LMP Unknown)   SpO2 96%   BMI 37.38 kg/m?   ? ?Subjective:  ? ? [name], female    DOB: August 06, 1945, 76 y.o.   MRN: [id number] ? ?HPI: ?[place] is a 76 y.o. female ? ?Chief Complaint  ?Patient presents with  ? Peripheral Neuropathy  ?  Patient is here to follow up on Neuropathy. Patient states she is doing better. Patient denies having any concerns at today's visit.   ? Diabetes  ?  Patient recent Diabetic Eye Exam was requested at today's visit.   ? ?NEUROPATHY ?Adjusted Gabapentin last visit to 900 MG QHS and 300 MG in morning and evening + added on Duloxetine due to ongoing pain -- no ADR with this.  Feeling some better with this regimen -- noticed a difference within a week.  Current CrCl 147 based on recent labs. ?Neuropathy status: controlled ?Satisfied with current treatment?: yes ?Medication side effects: no ?Medication compliance:  good compliance ?Location: foot and lower legs ?Pain: yes ?Severity: 3/10 at worst ?Quality:  dull, aching, and pins and needles ?Frequency: intermittent ?Bilateral: yes ?Symmetric: yes ?Numbness: yes ?Decreased sensation: yes ?Weakness: no ?Context: fluctuating ?Alleviating factors: nothing ?Aggravating factors: unknown ?Treatments attempted: Gabapentin and B12, Duloxetine ?  ?Relevant past medical, surgical, family and social history reviewed and updated as indicated. Interim medical history since our last visit reviewed. ?Allergies and medications reviewed and updated. ? ?Review of Systems  ?Constitutional:  Negative for activity change, appetite change, diaphoresis, fatigue and fever.  ?Respiratory:  Negative for cough, chest tightness, shortness of breath and wheezing.   ?Cardiovascular:  Negative for chest pain, palpitations and leg swelling.  ?Gastrointestinal: Negative.   ?Endocrine: Negative for cold intolerance, heat intolerance,  polydipsia, polyphagia and polyuria.  ?Genitourinary: Negative.   ?Neurological: Negative.   ?Psychiatric/Behavioral: Negative.    ? ?Per HPI unless specifically indicated above ? ?   ?Objective:  ?  ?BP 109/68   Pulse 99   Temp 98.7 ?F (37.1 ?C)   Ht '5\' 3"'  (1.6 m)   Wt 211 lb (95.7 kg)   LMP  (LMP Unknown)   SpO2 96%   BMI 37.38 kg/m?   ?Wt Readings from Last 3 Encounters:  ?09/12/21 211 lb (95.7 kg)  ?08/15/21 213 lb 9.6 oz (96.9 kg)  ?07/10/21 213 lb 9.6 oz (96.9 kg)  ?  ?Physical Exam ?Vitals and nursing note reviewed.  ?Constitutional:   ?   General: She is awake. She is not in acute distress. ?   Appearance: She is well-developed. She is obese. She is not ill-appearing.  ?HENT:  ?   Head: Normocephalic.  ?   Right Ear: Hearing, tympanic membrane and ear canal normal. No drainage.  ?   Left Ear: Hearing, tympanic membrane, ear canal and external ear normal. No drainage.  ?   Nose: Nose normal.  ?   Mouth/Throat:  ?   Mouth: Mucous membranes are moist.  ?   Pharynx: Oropharynx is clear.  ?Eyes:  ?   General: Lids are normal.     ?   Right eye: No discharge.     ?   Left eye: No discharge.  ?   Conjunctiva/sclera: Conjunctivae normal.  ?   Pupils: Pupils are equal, round, and reactive to light.  ?Neck:  ?  Vascular: No carotid bruit.  ?Cardiovascular:  ?   Rate and Rhythm: Normal rate and regular rhythm.  ?   Heart sounds: Normal heart sounds. No murmur heard. ?  No gallop.  ?Pulmonary:  ?   Effort: Pulmonary effort is normal. No accessory muscle usage or respiratory distress.  ?   Breath sounds: Normal breath sounds.  ?Abdominal:  ?   General: Bowel sounds are normal. There is no distension.  ?   Palpations: Abdomen is soft. There is no hepatomegaly.  ?   Tenderness: There is no abdominal tenderness. There is no right CVA tenderness or left CVA tenderness.  ?Musculoskeletal:  ?   Cervical back: Normal range of motion and neck supple.  ?   Right lower leg: No edema.  ?   Left lower leg: No edema.   ?Lymphadenopathy:  ?   Cervical: No cervical adenopathy.  ?Skin: ?   General: Skin is warm and dry.  ?Neurological:  ?   Mental Status: She is alert and oriented to person, place, and time.  ?Psychiatric:     ?   Attention and Perception: Attention normal.     ?   Mood and Affect: Mood normal.     ?   Behavior: Behavior normal. Behavior is cooperative.     ?   Thought Content: Thought content normal.     ?   Judgment: Judgment normal.  ? ? ?Results for orders placed or performed in visit on 08/15/21  ?Bayer DCA Hb A1c Waived  ?Result Value Ref Range  ? HB A1C (BAYER DCA - WAIVED) 6.5 (H) 4.8 - 5.6 %  ?Microalbumin, Urine Waived  ?Result Value Ref Range  ? Microalb, Ur Waived 30 (H) 0 - 19 mg/L  ? Creatinine, Urine Waived 50 10 - 300 mg/dL  ? Microalb/Creat Ratio 30-300 (H) <30 mg/g  ?T4, free  ?Result Value Ref Range  ? Free T4 1.05 0.82 - 1.77 ng/dL  ?Comprehensive metabolic panel  ?Result Value Ref Range  ? Glucose 194 (H) 70 - 99 mg/dL  ? BUN 11 8 - 27 mg/dL  ? Creatinine, Ser 0.62 0.57 - 1.00 mg/dL  ? eGFR 94 >59 mL/min/1.73  ? BUN/Creatinine Ratio 18 12 - 28  ? Sodium 142 134 - 144 mmol/L  ? Potassium 4.4 3.5 - 5.2 mmol/L  ? Chloride 99 96 - 106 mmol/L  ? CO2 26 20 - 29 mmol/L  ? Calcium 9.8 8.7 - 10.3 mg/dL  ? Total Protein 6.5 6.0 - 8.5 g/dL  ? Albumin 4.4 3.7 - 4.7 g/dL  ? Globulin, Total 2.1 1.5 - 4.5 g/dL  ? Albumin/Globulin Ratio 2.1 1.2 - 2.2  ? Bilirubin Total 0.3 0.0 - 1.2 mg/dL  ? Alkaline Phosphatase 44 44 - 121 IU/L  ? AST 15 0 - 40 IU/L  ? ALT 13 0 - 32 IU/L  ?Lipid Panel w/o Chol/HDL Ratio  ?Result Value Ref Range  ? Cholesterol, Total 137 100 - 199 mg/dL  ? Triglycerides 169 (H) 0 - 149 mg/dL  ? HDL 34 (L) >39 mg/dL  ? VLDL Cholesterol Cal 29 5 - 40 mg/dL  ? LDL Chol Calc (NIH) 74 0 - 99 mg/dL  ?TSH  ?Result Value Ref Range  ? TSH 0.414 (L) 0.450 - 4.500 uIU/mL  ? ?   ?Assessment & Plan:  ? ?Problem List Items Addressed This Visit   ? ?  ? Endocrine  ? Type 2 diabetes mellitus with diabetic  mononeuropathy, without long-term  current use of insulin ([place])  ?  Chronic, ongoing.  A1c at recent visit 6.5%, staying stable, praised for this and urine ALB 30 today (February 2023).   ?- Will continue Metformin XR 1000 MG BID. ?- Focus on diabetic diet at home and regular exercise.   ?- Continue Gabapentin daily for neuropathy, continue 900 MG at HS and 300 MG in morning + 300 MG at evening time + Duloxetine 60 MG daily as is offering benefit.  Current CrCl = 147 based on recent labs.    ?- Recommend she monitor feet closely for any wounds or skin breakdown, alert provider immediately if present.   ?- Return in September for physical. ?  ?  ?  ? ?Follow up plan: ?Return in about 23 weeks (around 02/20/2022) for Annual physical and diabetes check. ? ? ? ? ? ?

## 2021-10-24 DIAGNOSIS — H02882 Meibomian gland dysfunction right lower eyelid: Secondary | ICD-10-CM | POA: Diagnosis not present

## 2021-10-24 DIAGNOSIS — E119 Type 2 diabetes mellitus without complications: Secondary | ICD-10-CM | POA: Diagnosis not present

## 2021-10-24 DIAGNOSIS — H04129 Dry eye syndrome of unspecified lacrimal gland: Secondary | ICD-10-CM | POA: Diagnosis not present

## 2021-10-24 DIAGNOSIS — H02885 Meibomian gland dysfunction left lower eyelid: Secondary | ICD-10-CM | POA: Diagnosis not present

## 2021-10-24 DIAGNOSIS — H353130 Nonexudative age-related macular degeneration, bilateral, stage unspecified: Secondary | ICD-10-CM | POA: Diagnosis not present

## 2021-10-24 DIAGNOSIS — H26492 Other secondary cataract, left eye: Secondary | ICD-10-CM | POA: Diagnosis not present

## 2021-10-24 DIAGNOSIS — H40019 Open angle with borderline findings, low risk, unspecified eye: Secondary | ICD-10-CM | POA: Diagnosis not present

## 2021-10-30 ENCOUNTER — Ambulatory Visit (INDEPENDENT_AMBULATORY_CARE_PROVIDER_SITE_OTHER): Payer: Medicare HMO

## 2021-10-30 ENCOUNTER — Telehealth: Payer: Self-pay

## 2021-10-30 DIAGNOSIS — E1159 Type 2 diabetes mellitus with other circulatory complications: Secondary | ICD-10-CM

## 2021-10-30 DIAGNOSIS — E1141 Type 2 diabetes mellitus with diabetic mononeuropathy: Secondary | ICD-10-CM

## 2021-10-30 NOTE — Addendum Note (Signed)
Addended by: Marnee Guarneri T on: 10/30/2021 03:36 PM ? ? Modules accepted: Orders ? ?

## 2021-10-30 NOTE — Patient Instructions (Signed)
Ms. Sigmund, ? ?Thank you for talking with me today. I have included our care plan/goals in the following pages.  ? ?Please review and call me at [phone] with any questions. ? ?Thanks! ?Edison Nasuti  ? ?Madelin Rear, PharmD ?Clinical Pharmacist  ?[phone] ? ?Care Plan : [place]  ?Updates made by Madelin Rear, [facility] since 10/30/2021 12:00 AM  ?  ? ?Problem: DM, HTN, HLD, Osteopenia, gout   ?Priority: High  ?  ? ?Long-Range Goal: Disease Management   ?Start Date: 10/30/2021  ?This Visit's Progress: On track  ?Recent Progress: On track  ?Priority: High  ?Note:   ?Current Barriers:  ?Unable to achieve control of Hyperlipidemia  ?Unable to maintain routine exercise plan ? ?Pharmacist Clinical Goal(s):  ?Patient will verbalize ability to afford treatment regimen ?contact provider office for questions/concerns as evidenced notation of same in electronic health record through collaboration with PharmD and provider.  ? ?Interventions: ?1:1 collaboration with [name], NP regarding development and update of comprehensive plan of care as evidenced by provider attestation and co-signature ?Inter-disciplinary care team collaboration (see longitudinal plan of care) ?Comprehensive medication review performed; medication list updated in electronic medical record ? ?Hypertension (BP goal <130/80) ?-Controlled ?-OSA/CPAP - insurance issues, sleep studies booked out, ended up paying for CPAP herself.  ?-Current treatment: ?Valsartan-hctz 160-12.5 mg once daily ?Appropriate, Effective, Safe, Accessible ?Metoprolol succinate 25 mg daily ?Appropriate, Effective, Safe, Accessible ?-Current home readings: continues with [id number] systolic at home  ?-Denies hypotensive/hypertensive symptoms ?-Counseled to monitor BP at home 1-2x/week, document, and provide log at future appointments ?-Recommended to continue current medication ? ?T2DM w/ neuropathy (A1c goal <7%) ?-Controlled ?-Duloxetine 60 mg- some improvement in  nerve pain  ?-Current medications: ?Metformin XR 500 mg - two tabs BID (switched from IR to XR 01/2021) ?Appropriate, Effective, Safe, Accessible ?Duloxetine DR 60 mg once daily  ?Appropriate, Effective, Safe, Accessible ?-Medications previously tried: Metformin IR (GI pain) ?-Current home glucose readings ?fasting glucose: 124, 128, some 130s ?May 2023: 124, 108 ?-Denies hypoglycemic/hyperglycemic symptoms ?-Current meal patterns:  ?Nov 2022: update: appetite has gotten better, good intake of vegetables.  ?May 2023: vegetable garden, kale, spinach, lettuce, strawberries; minimal sweets, chips - might swap for nuts ?-Current exercise:  ?Nov 2022: no updates ?Aug 2022: limited due to recent GI pain - has f/u scheduled for this issue ?May 2023: worried about nerve pain, risk of falling. Would consider PT to help with this ?-Educated on A1c and blood sugar goals; ?Benefits of routine self-monitoring of blood sugar; ?-Recommended to continue current medication ?Recommend PT to help with nerve pain ? ?Medication Assistance: None required.  Patient affirms current coverage meets needs. ?  ? ? ?The patient verbalized understanding of instructions provided today and agreed to receive a MyChart copy of patient instruction and/or educational materials. ?Telephone follow up appointment with pharmacy team member scheduled for: See next appointment with "Care Management Staff" under "What's Next" below.    ?

## 2021-10-30 NOTE — Progress Notes (Signed)
? ? ?Chronic Care Management ?Pharmacy Note ? ?10/30/2021 ?Name:  Catherine Hinton MRN:  [id number] DOB:  1946-01-23 ? ?Summary: ?-Neuropathy. Reports Duloxetine 19m being helpful. some hesitancy to exercise d/t neuropathy and balance/fall concern. Doing daily feet checks ?-ASCVD Risk. 10-yr >20%, DM, HTN, Obesity - statin therapy would be recommended per ADA guidelines. Goal ldl reduction 50% from baseline and target <70.  ? ?Recommendations/Changes made from today's visit: ?-recommend referral to PT to help with balance issues related to neuropathy. ?-recommend statin initiation - could start low rosuvastatin [id number] if concerns with tolerability  ? ?Plan: ?-CP tel f/u [id number] f/u PRN ?-Next PCP visit 02/2022 ? ?Subjective: ?VirSheri Hinton an 75 39o. year old female who is a primary patient of Cannady, JolBarbaraann FasterP.  The CCM team was consulted for assistance with disease management and care coordination needs.   ? ?Engaged with patient by telephone for follow up visit in response to provider referral for pharmacy case management and/or care coordination services.  ? ?Consent to Services:  ?The patient was given information about Chronic Care Management services, agreed to services, and gave verbal consent prior to initiation of services.  Please see initial visit note for detailed documentation.  ? ?Patient Care Team: ?CanVenita LickP as PCP - General (Nurse Practitioner) ?WooAnell BarrD (Optometry) ?[facility]adelin [facility]PH[facility] Pharmacist (Pharmacist) ? ?Objective: ? ?Lab Results  ?Component Value Date  ? CREATININE 0.62 08/15/2021  ? CREATININE 0.52 (L) 05/17/2021  ? CREATININE 0.64 02/07/2021  ? ? ?Lab Results  ?Component Value Date  ? HGBA1C 6.5 (H) 08/15/2021  ? ?Last diabetic Eye exam:  ?Lab Results  ?Component Value Date/Time  ? HMDIABEYEEXA No Retinopathy 08/29/2021 12:00 AM  ?  ?Last diabetic Foot exam:  ?Lab Results  ?Component Value Date/Time  ? HMDIABFOOTEX PP 11/02/2014 12:00 AM  ?  ? ?    ?Component Value Date/Time  ? CHOL 137 08/15/2021 0934  ? CHOL 225 (H) 12/09/2018 0820  ? TRIG 169 (H) 08/15/2021 0934  ? TRIG 115 12/09/2018 0820  ? HDL 34 (L) 08/15/2021 0934  ? VLDL 23 12/09/2018 0820  ? LDLWarren 08/15/2021 0934  ? ? ? ?  Latest Ref Rng & Units 08/15/2021  ?  9:34 AM 02/07/2021  ?  9:03 AM 10/02/2019  ? 10:43 AM  ?Hepatic Function  ?Total Protein 6.0 - 8.5 g/dL 6.5   6.9   7.1    ?Albumin 3.7 - 4.7 g/dL 4.4   4.8   4.7    ?AST 0 - 40 IU/L '15   19   16    ' ?ALT 0 - 32 IU/L '13   17   20    ' ?Alk Phosphatase 44 - 121 IU/L 44   48   46    ?Total Bilirubin 0.0 - 1.2 mg/dL 0.3   0.7   0.4    ? ? ?Lab Results  ?Component Value Date/Time  ? TSH 0.414 (L) 08/15/2021 09:34 AM  ? TSH 0.452 05/17/2021 11:11 AM  ? FREET4 1.05 08/15/2021 09:34 AM  ? FREET4 0.97 05/17/2021 11:11 AM  ? ? ? ?  Latest Ref Rng & Units 02/07/2021  ?  9:03 AM 10/02/2019  ? 10:43 AM 09/18/2019  ? 10:43 AM  ?CBC  ?WBC 3.4 - 10.8 x10E3/uL 5.6   5.4   6.7    ?Hemoglobin 11.1 - 15.9 g/dL 13.3   13.3   13.8    ?Hematocrit 34.0 - 46.6 %  40.5   39.6   39.4    ?Platelets 150 - 450 x10E3/uL 338   319   291    ? ? ?Lab Results  ?Component Value Date/Time  ? VD25OH 46.3 02/07/2021 09:03 AM  ? VD25OH 41.5 07/12/2020 09:47 AM  ? ? ?Clinical ASCVD:  ?The 10-year ASCVD risk score (Arnett DK, et al., 2019) is: 22.5% ?  Values used to calculate the score: ?    Age: 5 years ?    Sex: Female ?    Is Non-Hispanic African American: No ?    Diabetic: Yes ?    Tobacco smoker: No ?    Systolic Blood Pressure: 588 mmHg ?    Is BP treated: Yes ?    HDL Cholesterol: 34 mg/dL ?    Total Cholesterol: 137 mg/dL   ? ?Other: (CHADS2VASc if Afib, PHQ9 if depression, MMRC or CAT for COPD, ACT, DEXA) ? ?Social History  ? ?Tobacco Use  ?Smoking Status Never  ?Smokeless Tobacco Never  ? ?BP Readings from Last 3 Encounters:  ?09/12/21 109/68  ?08/15/21 110/72  ?07/10/21 131/79  ? ?Pulse Readings from Last 3 Encounters:  ?09/12/21 99  ?08/15/21 98  ?07/10/21 88  ? ?Wt Readings  from Last 3 Encounters:  ?09/12/21 211 lb (95.7 kg)  ?08/15/21 213 lb 9.6 oz (96.9 kg)  ?07/10/21 213 lb 9.6 oz (96.9 kg)  ? ? ?Assessment: Review of patient past medical history, allergies, medications, health status, including review of consultants reports, laboratory and other test data, was performed as part of comprehensive evaluation and provision of chronic care management services.  ? ?SDOH:  (Social Determinants of Health) assessments and interventions performed: Yes, today. ? ?Care Gaps: Needs foot exam ? ?Adherence review: no fill gaps for RAASi or Metformin. PDCs 100. Not on statin rx w/ dm - made recommendation to PCP. ? ?SDOH Interventions   ? ?Flowsheet Row Most Recent Value  ?SDOH Interventions   ?Food Insecurity Interventions Intervention Not Indicated  ?Transportation Interventions Other (Comment)  [[facility] transportion number provided]  ? ?  ? ? ?SDOH Interventions   ? ?Flowsheet Row Most Recent Value  ?SDOH Interventions   ?Food Insecurity Interventions Intervention Not Indicated  ?Transportation Interventions Other (Comment)  [[facility] transportion number provided]  ? ?  ? ? ?Isabel ? ?Allergies  ?Allergen Reactions  ? Niacin Itching  ? Sulfa Antibiotics Other (See Comments)  ?  Fever  ? ? ?Medications Reviewed Today   ? ? Reviewed by Catherine Hinton, [facility] (Pharmacist) on 10/30/21 at [phone]  Med List Status: <None>  ? ?Medication Order Taking? Sig Documenting Provider Last Dose Status Informant  ?allopurinol (ZYLOPRIM) 300 MG tablet [id number] No Take 1 tablet (300 mg total) by mouth daily. Marnee Guarneri T, NP Taking Active   ?aspirin 81 MG EC tablet [id number] No Take 81 mg by mouth daily.  [provider] Taking Active   ?Blood Glucose Monitoring Suppl (TRUE METRIX AIR GLUCOSE METER) w/Device KIT [id number] No Use to check blood sugar 2-3 times a day and document.  Bring log to visits. Catherine Lick, NP Taking Active   ?Cholecalciferol 25 MCG (1000 UT) tablet [id number] No Take 2,000 Units  by mouth daily.  [provider] Taking Active   ?         ?Med Note Amado Coe   Wed Mar 29, 2021 10:15 AM)    ?DHA-EPA-Flaxseed Oil-Vitamin E (THERA TEARS NUTRITION PO) [id number] No Take 1 mg by mouth 2 (  two) times daily.  [provider] Taking Active   ?DULoxetine (CYMBALTA) 30 MG capsule [id number] No Start out with one capsule (30 MG) once daily by mouth for one week and if tolerating at week two may increase to 2 tablets (60 MG) by mouth daily. Marnee Guarneri T, NP Taking Active   ?gabapentin (NEURONTIN) 300 MG capsule [id number] No TAKE 1 CAPSULE EVERY MORNING, AND THEN 1 CAPSULE AT 6 PM, AND THEN TAKE 3 CAPSULES EVERY NIGHT BEFORE BEDTIME. Marnee Guarneri T, NP Taking Active   ?gemfibrozil (LOPID) 600 MG tablet [id number] No Take 1 tablet (600 mg total) by mouth 2 (two) times daily. Marnee Guarneri T, NP Taking Active   ?glucose blood (TRUE METRIX BLOOD GLUCOSE TEST) test strip [id number] No Use to check blood sugar 2-3 times a day and document.  Bring log to visits. Marnee Guarneri T, NP Taking Active   ?metFORMIN (GLUCOPHAGE-XR) 500 MG 24 hr tablet [id number] No TAKE 2 TABLETS EVERY MORNING  AND TAKE 2 TABLETS AT BEDTIME Cannady, [name] T, NP Taking Active   ?metoprolol succinate (TOPROL-XL) 25 MG 24 hr tablet [id number] No Take 1 tablet (25 mg total) by mouth daily. Catherine Lick, NP Taking Active   ?Multiple Vitamins-Minerals (EYE VITAMINS PO) [id number] No Take 1 tablet by mouth daily. [provider] Taking Active   ?TRUEplus Lancets 33G MISC [id number] No Use to check blood sugar 2 to 3 times a day and document. Bring log to visits. Marnee Guarneri T, NP Taking Active   ?valsartan-hydrochlorothiazide (DIOVAN-HCT) 160-12.5 MG tablet [id number] No Take 1 tablet by mouth daily. Marnee Guarneri T, NP Taking Active   ?vitamin B-12 (CYANOCOBALAMIN) 1000 MCG tablet [id number] No Take 1,000 mcg by mouth daily. [provider] Taking Active   ?XIIDRA 5 % SOLN [id number]  No Place 1 drop into both eyes 2 (two) times daily.  [provider] Taking Active Self  ?         ?Med Note Amado Coe   Wed Mar 29, 2021 10:16 AM)    ? ?  ?  ? ?  ? ? ?Patient Active Pro

## 2021-10-30 NOTE — Telephone Encounter (Signed)
-----   Message from [name], NP sent at 10/30/2021  3:36 PM EDT ----- ?I placed a referral to PT for her. ?----- Message ----- ?From: Madelin Rear, [facility] ?Sent: 10/30/2021  10:00 AM EDT ?To: [name], NP ? ?[name] - patient is wanting to get back to exercising but has concern with neuropathy and balance issues. Would you consider PT referral for this? In our conversation patient seemed open. Your next visit is 02/21/2022 w/ patient. If you want to refer prior, I can update patient. Thanks! ? ?

## 2021-11-02 DIAGNOSIS — R2689 Other abnormalities of gait and mobility: Secondary | ICD-10-CM | POA: Diagnosis not present

## 2021-11-02 NOTE — Progress Notes (Signed)
I have attempted to reach out to the patient about her PT referral. I left a voicemail for her to call back.  Corrie Mckusick, [place]

## 2021-11-07 DIAGNOSIS — R2689 Other abnormalities of gait and mobility: Secondary | ICD-10-CM | POA: Diagnosis not present

## 2021-11-09 DIAGNOSIS — R2689 Other abnormalities of gait and mobility: Secondary | ICD-10-CM | POA: Diagnosis not present

## 2021-11-15 DIAGNOSIS — Z7984 Long term (current) use of oral hypoglycemic drugs: Secondary | ICD-10-CM

## 2021-11-15 DIAGNOSIS — I152 Hypertension secondary to endocrine disorders: Secondary | ICD-10-CM

## 2021-11-15 DIAGNOSIS — E1142 Type 2 diabetes mellitus with diabetic polyneuropathy: Secondary | ICD-10-CM

## 2021-11-15 DIAGNOSIS — R2689 Other abnormalities of gait and mobility: Secondary | ICD-10-CM | POA: Diagnosis not present

## 2021-11-15 DIAGNOSIS — E1159 Type 2 diabetes mellitus with other circulatory complications: Secondary | ICD-10-CM | POA: Diagnosis not present

## 2021-11-15 DIAGNOSIS — E1141 Type 2 diabetes mellitus with diabetic mononeuropathy: Secondary | ICD-10-CM

## 2021-11-17 DIAGNOSIS — R2689 Other abnormalities of gait and mobility: Secondary | ICD-10-CM | POA: Diagnosis not present

## 2021-11-21 DIAGNOSIS — R2689 Other abnormalities of gait and mobility: Secondary | ICD-10-CM | POA: Diagnosis not present

## 2021-11-23 DIAGNOSIS — R2689 Other abnormalities of gait and mobility: Secondary | ICD-10-CM | POA: Diagnosis not present

## 2021-11-28 DIAGNOSIS — R2689 Other abnormalities of gait and mobility: Secondary | ICD-10-CM | POA: Diagnosis not present

## 2021-12-07 ENCOUNTER — Other Ambulatory Visit: Payer: Self-pay | Admitting: Nurse Practitioner

## 2021-12-07 NOTE — Telephone Encounter (Signed)
Requested Prescriptions  Pending Prescriptions Disp Refills  . allopurinol (ZYLOPRIM) 300 MG tablet [Pharmacy Med Name: ALLOPURINOL 300 MG Tablet] 90 tablet 4    Sig: TAKE 1 TABLET EVERY DAY     Endocrinology:  Gout Agents - allopurinol Passed - 12/07/2021  3:11 PM      Passed - Uric Acid in normal range and within 360 days    Uric Acid  Date Value Ref Range Status  02/07/2021 5.6 3.1 - 7.9 mg/dL Final    Comment:               Therapeutic target for gout patients: <6.0         Passed - Cr in normal range and within 360 days    Creatinine, Ser  Date Value Ref Range Status  08/15/2021 0.62 0.57 - 1.00 mg/dL Final         Passed - Valid encounter within last 12 months    Recent Outpatient Visits          2 months ago Type 2 diabetes mellitus with diabetic mononeuropathy, without long-term current use of insulin ([place])   [place], [name] T, NP   3 months ago Type 2 diabetes mellitus with diabetic mononeuropathy, without long-term current use of insulin ([place])   [place], [place] T, NP   5 months ago Obstructive sleep apnea   [place], [place] T, NP   6 months ago Type 2 diabetes mellitus with diabetic mononeuropathy, without long-term current use of insulin ([place])   [place], [place] T, NP   8 months ago [place], [place] Glad, NP      Future Appointments            In 2 months Cannady, Barbaraann Faster, NP [facility]   In 4 months  [facility]           Passed - CBC within normal limits and completed in the last 12 months    WBC  Date Value Ref Range Status  02/07/2021 5.6 3.4 - 10.8 x10E3/uL Final   RBC  Date Value Ref Range Status  02/07/2021 4.75 3.77 - 5.28 x10E6/uL Final   Hemoglobin  Date Value Ref Range Status  02/07/2021 13.3 11.1 - 15.9 g/dL Final   Hematocrit  Date Value Ref Range Status  02/07/2021  40.5 34.0 - 46.6 % Final   MCHC  Date Value Ref Range Status  02/07/2021 32.8 31.5 - 35.7 g/dL Final   [facility]  Date Value Ref Range Status  02/07/2021 28.0 26.6 - 33.0 pg Final   MCV  Date Value Ref Range Status  02/07/2021 85 79 - 97 fL Final   No results found for: "PLTCOUNTKUC", "LABPLAT", "POCPLA" RDW  Date Value Ref Range Status  02/07/2021 13.4 11.7 - 15.4 % Final         . gemfibrozil (LOPID) 600 MG tablet [Pharmacy Med Name: GEMFIBROZIL 600 MG Tablet] 180 tablet 4    Sig: TAKE 1 TABLET TWICE DAILY     Cardiovascular:  Antilipid - Fibric Acid Derivatives Failed - 12/07/2021  3:11 PM      Failed - Lipid Panel in normal range within the last 12 months    Cholesterol, Total  Date Value Ref Range Status  08/15/2021 137 100 - 199 mg/dL Final   Cholesterol Pic[place]o, Waived  Date Value Ref Range Status  12/09/2018 225 (H) <200 mg/dL Final  Comment:                            Desirable                <200                         Borderline High      200- 239                         High                     >239    LDL Chol Calc (NIH)  Date Value Ref Range Status  08/15/2021 74 0 - 99 mg/dL Final   HDL  Date Value Ref Range Status  08/15/2021 34 (L) >39 mg/dL Final   Triglycerides  Date Value Ref Range Status  08/15/2021 169 (H) 0 - 149 mg/dL Final   Triglycerides Piccolo,Waived  Date Value Ref Range Status  12/09/2018 115 <150 mg/dL Final    Comment:                            Normal                   <150                         Borderline High     150 - 199                         High                200 - 499                         Very High                >499          Passed - ALT in normal range and within 360 days    ALT  Date Value Ref Range Status  08/15/2021 13 0 - 32 IU/L Final         Passed - AST in normal range and within 360 days    AST  Date Value Ref Range Status  08/15/2021 15 0 - 40 IU/L Final         Passed - Cr in normal  range and within 360 days    Creatinine, Ser  Date Value Ref Range Status  08/15/2021 0.62 0.57 - 1.00 mg/dL Final         Passed - HGB in normal range and within 360 days    Hemoglobin  Date Value Ref Range Status  02/07/2021 13.3 11.1 - 15.9 g/dL Final         Passed - HCT in normal range and within 360 days    Hematocrit  Date Value Ref Range Status  02/07/2021 40.5 34.0 - 46.6 % Final         Passed - PLT in normal range and within 360 days    Platelets  Date Value Ref Range Status  02/07/2021 338 150 - 450 x10E3/uL Final         Passed - WBC in normal range and within 360  days    WBC  Date Value Ref Range Status  02/07/2021 5.6 3.4 - 10.8 x10E3/uL Final         Passed - eGFR is 30 or above and within 360 days    GFR calc Af Amer  Date Value Ref Range Status  07/12/2020 105 >59 mL/min/1.73 Final    Comment:    **In accordance with recommendations from the NKF-ASN Task force,**   [facility] is in the process of updating its eGFR calculation to the   2021 CKD-EPI creatinine equation that estimates kidney function   without a race variable.    GFR calc non Af Amer  Date Value Ref Range Status  07/12/2020 91 >59 mL/min/1.73 Final   eGFR  Date Value Ref Range Status  08/15/2021 94 >59 mL/min/1.73 Final         Passed - Valid encounter within last 12 months    Recent Outpatient Visits          2 months ago Type 2 diabetes mellitus with diabetic mononeuropathy, without long-term current use of insulin ([place])   [place], [place] T, NP   3 months ago Type 2 diabetes mellitus with diabetic mononeuropathy, without long-term current use of insulin ([place])   [place], [place] T, NP   5 months ago Obstructive sleep apnea   [place], [place] T, NP   6 months ago Type 2 diabetes mellitus with diabetic mononeuropathy, without long-term current use of insulin ([place])   [place], [place]  T, NP   8 months ago [place], NP      Future Appointments            In 2 months Cannady, Barbaraann Faster, NP [facility]   In 4 months  [facility]

## 2022-01-04 ENCOUNTER — Other Ambulatory Visit: Payer: Self-pay | Admitting: Nurse Practitioner

## 2022-01-05 NOTE — Telephone Encounter (Signed)
Requested Prescriptions  Pending Prescriptions Disp Refills  . metFORMIN (GLUCOPHAGE-XR) 500 MG 24 hr tablet [Pharmacy Med Name: METFORMIN HYDROCHLORIDE ER 500 MG Tablet Extended Release 24 Hour] 360 tablet 0    Sig: TAKE 2 TABLETS EVERY MORNING  AND TAKE 2 TABLETS AT BEDTIME     Endocrinology:  Diabetes - Biguanides Passed - 01/04/2022 10:33 AM      Passed - Cr in normal range and within 360 days    Creatinine, Ser  Date Value Ref Range Status  08/15/2021 0.62 0.57 - 1.00 mg/dL Final         Passed - HBA1C is between 0 and 7.9 and within 180 days    Hemoglobin A1C  Date Value Ref Range Status  11/06/2017 6.8  Final   HB A1C (BAYER DCA - WAIVED)  Date Value Ref Range Status  08/15/2021 6.5 (H) 4.8 - 5.6 % Final    Comment:             Prediabetes: 5.7 - 6.4          Diabetes: >6.4          Glycemic control for adults with diabetes: <7.0          Passed - eGFR in normal range and within 360 days    GFR calc Af Amer  Date Value Ref Range Status  07/12/2020 105 >59 mL/min/1.73 Final    Comment:    **In accordance with recommendations from the NKF-ASN Task force,**   [facility] is in the process of updating its eGFR calculation to the   2021 CKD-EPI creatinine equation that estimates kidney function   without a race variable.    GFR calc non Af Amer  Date Value Ref Range Status  07/12/2020 91 >59 mL/min/1.73 Final   eGFR  Date Value Ref Range Status  08/15/2021 94 >59 mL/min/1.73 Final         Passed - B12 Level in normal range and within 720 days    Vitamin B-12  Date Value Ref Range Status  10/10/2020 563 232 - 1,245 pg/mL Final         Passed - Valid encounter within last 6 months    Recent Outpatient Visits          3 months ago Type 2 diabetes mellitus with diabetic mononeuropathy, without long-term current use of insulin ([place])   [place], [name] T, NP   4 months ago Type 2 diabetes mellitus with diabetic mononeuropathy, without  long-term current use of insulin ([place])   [place], [place] T, NP   5 months ago Obstructive sleep apnea   [place], [place] T, NP   7 months ago Type 2 diabetes mellitus with diabetic mononeuropathy, without long-term current use of insulin ([place])   [place], [place] T, NP   9 months ago [place], NP      Future Appointments            In 1 month Cannady, Henrine Screws T, NP [facility]   In 3 months  [facility]           Passed - CBC within normal limits and completed in the last 12 months    WBC  Date Value Ref Range Status  02/07/2021 5.6 3.4 - 10.8 x10E3/uL Final   RBC  Date Value Ref Range Status  02/07/2021 4.75 3.77 - 5.28 x10E6/uL  Final   Hemoglobin  Date Value Ref Range Status  02/07/2021 13.3 11.1 - 15.9 g/dL Final   Hematocrit  Date Value Ref Range Status  02/07/2021 40.5 34.0 - 46.6 % Final   MCHC  Date Value Ref Range Status  02/07/2021 32.8 31.5 - 35.7 g/dL Final   [facility]  Date Value Ref Range Status  02/07/2021 28.0 26.6 - 33.0 pg Final   MCV  Date Value Ref Range Status  02/07/2021 85 79 - 97 fL Final   No results found for: "PLTCOUNTKUC", "LABPLAT", "POCPLA" RDW  Date Value Ref Range Status  02/07/2021 13.4 11.7 - 15.4 % Final

## 2022-01-25 ENCOUNTER — Other Ambulatory Visit: Payer: Self-pay | Admitting: Otolaryngology

## 2022-01-25 DIAGNOSIS — E041 Nontoxic single thyroid nodule: Secondary | ICD-10-CM

## 2022-01-25 DIAGNOSIS — E0590 Thyrotoxicosis, unspecified without thyrotoxic crisis or storm: Secondary | ICD-10-CM | POA: Diagnosis not present

## 2022-02-18 NOTE — Patient Instructions (Signed)

## 2022-02-20 ENCOUNTER — Other Ambulatory Visit: Payer: Self-pay | Admitting: Nurse Practitioner

## 2022-02-20 DIAGNOSIS — Z1231 Encounter for screening mammogram for malignant neoplasm of breast: Secondary | ICD-10-CM

## 2022-02-21 ENCOUNTER — Encounter: Payer: Self-pay | Admitting: Nurse Practitioner

## 2022-02-21 ENCOUNTER — Ambulatory Visit (INDEPENDENT_AMBULATORY_CARE_PROVIDER_SITE_OTHER): Payer: Medicare HMO | Admitting: Nurse Practitioner

## 2022-02-21 VITALS — BP 109/71 | HR 98 | Temp 99.0°F | Ht 63.0 in | Wt 208.1 lb

## 2022-02-21 DIAGNOSIS — E042 Nontoxic multinodular goiter: Secondary | ICD-10-CM

## 2022-02-21 DIAGNOSIS — E785 Hyperlipidemia, unspecified: Secondary | ICD-10-CM | POA: Diagnosis not present

## 2022-02-21 DIAGNOSIS — E1169 Type 2 diabetes mellitus with other specified complication: Secondary | ICD-10-CM | POA: Diagnosis not present

## 2022-02-21 DIAGNOSIS — M1A3610 Chronic gout due to renal impairment, right knee, without tophus (tophi): Secondary | ICD-10-CM | POA: Diagnosis not present

## 2022-02-21 DIAGNOSIS — G4733 Obstructive sleep apnea (adult) (pediatric): Secondary | ICD-10-CM | POA: Diagnosis not present

## 2022-02-21 DIAGNOSIS — Z0000 Encounter for general adult medical examination without abnormal findings: Secondary | ICD-10-CM

## 2022-02-21 DIAGNOSIS — E0590 Thyrotoxicosis, unspecified without thyrotoxic crisis or storm: Secondary | ICD-10-CM

## 2022-02-21 DIAGNOSIS — E1141 Type 2 diabetes mellitus with diabetic mononeuropathy: Secondary | ICD-10-CM | POA: Diagnosis not present

## 2022-02-21 DIAGNOSIS — E1159 Type 2 diabetes mellitus with other circulatory complications: Secondary | ICD-10-CM | POA: Diagnosis not present

## 2022-02-21 DIAGNOSIS — E559 Vitamin D deficiency, unspecified: Secondary | ICD-10-CM

## 2022-02-21 DIAGNOSIS — I152 Hypertension secondary to endocrine disorders: Secondary | ICD-10-CM | POA: Diagnosis not present

## 2022-02-21 DIAGNOSIS — M85851 Other specified disorders of bone density and structure, right thigh: Secondary | ICD-10-CM

## 2022-02-21 DIAGNOSIS — I700 Atherosclerosis of aorta: Secondary | ICD-10-CM | POA: Diagnosis not present

## 2022-02-21 DIAGNOSIS — E538 Deficiency of other specified B group vitamins: Secondary | ICD-10-CM

## 2022-02-21 LAB — BAYER DCA HB A1C WAIVED: HB A1C (BAYER DCA - WAIVED): 6.2 % — ABNORMAL HIGH (ref 4.8–5.6)

## 2022-02-21 MED ORDER — METFORMIN HCL ER 500 MG PO TB24: ORAL_TABLET | ORAL | 4 refills | Status: DC

## 2022-02-21 MED ORDER — ALLOPURINOL 300 MG PO TABS: 300.0000 mg | ORAL_TABLET | Freq: Every day | ORAL | 4 refills | Status: DC

## 2022-02-21 MED ORDER — VALSARTAN-HYDROCHLOROTHIAZIDE 80-12.5 MG PO TABS: 1.0000 | ORAL_TABLET | Freq: Every day | ORAL | 4 refills | Status: DC

## 2022-02-21 MED ORDER — ROSUVASTATIN CALCIUM 20 MG PO TABS: 20.0000 mg | ORAL_TABLET | Freq: Every day | ORAL | 4 refills | Status: DC

## 2022-02-21 NOTE — Progress Notes (Signed)
BP 109/71   Pulse 98 Comment: apical  Temp 99 F (37.2 C) (Oral)   Ht '5\' 3"'  (1.6 m)   Wt 208 lb 1.6 oz (94.4 kg)   LMP  (LMP Unknown)   SpO2 94%   BMI 36.86 kg/m    Subjective:    [name], female    DOB: 1945-08-20, 76 y.o.   MRN: [id number]  HPI: Catherine Hinton is a 76 y.o. female presenting on 02/21/2022 for follow-up and health maintenance review. Current medical complaints include:none  She currently lives with: self Menopausal Symptoms: no   DIABETES A1c in February was 6.5%.  Continues on Metformin 1000 MG BID.  Taking Gabapentin & Duloxetine for neuropathy which offers benefit, but she is noticing worsening to both feet and hands. Hypoglycemic episodes:no Polydipsia/polyuria: no Visual disturbance: no Chest pain: no Paresthesias: no Glucose Monitoring: yes             Accucheck frequency: Daily             Fasting glucose: average <120 on checks             Post prandial:             Evening:             Before meals: Taking Insulin?: no             Long acting insulin:             Short acting insulin: Blood Pressure Monitoring: daily Retinal Examination: Up to Date Foot Exam: Up to Date Pneumovax: Up to Date Influenza: Up to Date Aspirin: yes    HYPERTENSION / HYPERLIPIDEMIA Continues on Metoprolol and Valsartan-HCTZ, ASA and Lopid -- no current statin, has not taken in past.  Continues on 1000 units Vit D for Vit D deficiency.  Has CPAP and uses this nightly. Satisfied with current treatment? yes Duration of hypertension: chronic BP monitoring frequency: a few times a week BP range: 110-120/70 at home on average BP medication side effects: no Duration of hyperlipidemia: chronic Cholesterol medication side effects: no Cholesterol supplements: none Medication compliance: good compliance Aspirin: yes Recent stressors: no Recurrent headaches: no Visual changes: no Palpitations: no Dyspnea: no Chest pain: no Lower extremity  edema: no Dizzy/lightheaded: occasional if bending over The 10-year ASCVD risk score (Arnett DK, et al., 2019) is: 24.7%   Values used to calculate the score:     Age: 20 years     Sex: Female     Is Non-Hispanic African American: No     Diabetic: Yes     Tobacco smoker: No     Systolic Blood Pressure: 761 mmHg     Is BP treated: Yes     HDL Cholesterol: 34 mg/dL     Total Cholesterol: 137 mg/dL  GOUT No recent flares.  Continues on Allopurinol.   Duration:chronic Swelling: no Redness: no Trauma: no Recent dietary change or indiscretion: no Fevers: no Nausea/vomiting: no Status:  stable  OSTEOPENIA Taking daily Vitamin D supplement. Last DEXA 02/03/20. Satisfied with current treatment?: yes Past osteoporosis medications/treatments:  supplements Adequate calcium & vitamin D: yes Weight bearing exercises: yes   HYPERTHYROIDISM Noted on past labs an had biopsy with ENT on December 2022 which was negative.  Returned visit to ENT in August 2023, recommendations for endocrinology if ongoing low TSH. Fatigue:  sometimes Cold intolerance: no Heat intolerance: yes Weight gain: no Weight loss: no Constipation: yes Diarrhea/loose stools: no Palpitations: no  Lower extremity edema: no Anxiety/depressed mood:  occasional    Depression Screen done today and results listed below:     02/21/2022    9:05 AM 09/12/2021    9:10 AM 08/15/2021    9:31 AM 07/10/2021    1:07 PM 05/17/2021   11:08 AM  Depression screen PHQ 2/9  Decreased Interest 0 0 1 0 0  Down, Depressed, Hopeless 0 0 1 0 0  PHQ - 2 Score 0 0 2 0 0  Altered sleeping '1 1 3 1 1  ' Tired, decreased energy '1 1 1 1 2  ' Change in appetite '1 1 1 1 1  ' Feeling bad or failure about yourself  0 0 0 1 0  Trouble concentrating 0 0 0 0 0  Moving slowly or fidgety/restless 0 0 0 0 0  Suicidal thoughts 0 0 0 0 0  PHQ-9 Score '3 3 7 4 4  ' Difficult doing work/chores Not difficult at all          02/21/2022    9:05 AM 09/12/2021     9:10 AM 08/15/2021    9:32 AM 07/10/2021    1:08 PM  GAD 7 : Generalized Anxiety Score  Nervous, Anxious, on Edge 1 0 1 1  Control/stop worrying 0 0 1 1  Worry too much - different things 0 0 1 0  Trouble relaxing 0 0 0 0  Restless 0 0 0 0  Easily annoyed or irritable 0 0 1 0  Afraid - awful might happen 0 0 0 0  Total GAD 7 Score 1 0 4 2  Anxiety Difficulty Not difficult at all Not difficult at all Somewhat difficult       05/17/2021   11:07 AM 08/15/2021    9:33 AM 09/12/2021    9:10 AM 02/21/2022    9:05 AM 02/21/2022    9:29 AM  Fall Risk  Falls in the past year?  0 0 0 0  Was there an injury with Fall? 0 0 0 0 0  Fall Risk Category Calculator  0 0 0 0  Fall Risk Category  Low Low Low Low  Patient Fall Risk Level Low fall risk Low fall risk Low fall risk Low fall risk Low fall risk  Patient at Risk for Falls Due to History of fall(s) History of fall(s) No Fall Risks No Fall Risks No Fall Risks  Fall risk Follow up Falls evaluation completed Falls evaluation completed Falls evaluation completed Falls evaluation completed Falls prevention discussed    Functional Status Survey: Is the patient deaf or have difficulty hearing?: No Does the patient have difficulty seeing, even when wearing glasses/contacts?: No Does the patient have difficulty concentrating, remembering, or making decisions?: No Does the patient have difficulty walking or climbing stairs?: No Does the patient have difficulty dressing or bathing?: No Does the patient have difficulty doing errands alone such as visiting a doctor's office or shopping?: No   Past Medical History:  Past Medical History:  Diagnosis Date   Chronic kidney disease 04/25/2015   Diabetes mellitus with renal manifestation ([place])    Diabetes mellitus without complication ([place])    Glaucoma    Gout    Hyperlipidemia    Hypertension    Hypertensive CKD (chronic kidney disease)    Neuropathy    feet   Obesity    Sleep apnea    CPAP     Surgical History:  Past Surgical History:  Procedure Laterality Date   ABDOMINAL HYSTERECTOMY  BREAST CYST ASPIRATION     CHOLECYSTECTOMY     COLONOSCOPY WITH PROPOFOL N/A 02/01/2020   Procedure: COLONOSCOPY WITH PROPOFOL;  Surgeon: [name], MD;  Location: [facility];  Service: Endoscopy;  Laterality: N/A;  Diabetic - oral meds sleep apnea priority 4   HERNIA REPAIR     THUMB FUSION      Medications:  Current Outpatient Medications on File Prior to Visit  Medication Sig   aspirin 81 MG EC tablet Take 81 mg by mouth daily.    Blood Glucose Monitoring Suppl (TRUE METRIX AIR GLUCOSE METER) w/Device KIT Use to check blood sugar 2-3 times a day and document.  Bring log to visits.   Cholecalciferol 25 MCG (1000 UT) tablet Take 2,000 Units by mouth daily.    DHA-EPA-Flaxseed Oil-Vitamin E (THERA TEARS NUTRITION PO) Take 1 mg by mouth 2 (two) times daily.    DULoxetine (CYMBALTA) 30 MG capsule Start out with one capsule (30 MG) once daily by mouth for one week and if tolerating at week two may increase to 2 tablets (60 MG) by mouth daily.   gabapentin (NEURONTIN) 300 MG capsule TAKE 1 CAPSULE EVERY MORNING, AND THEN 1 CAPSULE AT 6 PM, AND THEN TAKE 3 CAPSULES EVERY NIGHT BEFORE BEDTIME.   glucose blood (TRUE METRIX BLOOD GLUCOSE TEST) test strip Use to check blood sugar 2-3 times a day and document.  Bring log to visits.   metoprolol succinate (TOPROL-XL) 25 MG 24 hr tablet Take 1 tablet (25 mg total) by mouth daily.   Multiple Vitamins-Minerals (EYE VITAMINS PO) Take 1 tablet by mouth daily.   TRUEplus Lancets 33G MISC Use to check blood sugar 2 to 3 times a day and document. Bring log to visits.   vitamin B-12 (CYANOCOBALAMIN) 1000 MCG tablet Take 1,000 mcg by mouth daily.   XIIDRA 5 % SOLN Place 1 drop into both eyes 2 (two) times daily.    No current facility-administered medications on file prior to visit.    Allergies:  Allergies  Allergen Reactions   Niacin  Itching   Sulfa Antibiotics Other (See Comments)    Fever    Social History:  Social History   Socioeconomic History   Marital status: Divorced    Spouse name: Not on file   Number of children: Not on file   Years of education: Not on file   Highest education level: Associate degree: academic program  Occupational History   Occupation: retired  Tobacco Use   Smoking status: Never   Smokeless tobacco: Never  Vaping Use   Vaping Use: Never used  Substance and Sexual Activity   Alcohol use: No    Alcohol/week: 0.0 standard drinks of alcohol   Drug use: No   Sexual activity: Not Currently  Other Topics Concern   Not on file  Social History Narrative   Not on file   Social Determinants of Health   Financial Resource Strain: Low Risk  (04/17/2021)   Overall Financial Resource Strain (CARDIA)    Difficulty of Paying Living Expenses: Not hard at all  Food Insecurity: No Food Insecurity (10/30/2021)   Hunger Vital Sign    Worried About Running Out of Food in the Last Year: Never true    [place] in the Last Year: Never true  Transportation Needs: Unmet Transportation Needs (10/30/2021)   PRAPARE - Hydrologist (Medical): Yes    Lack of Transportation (Non-Medical): Yes  Physical Activity: Inactive (  04/17/2021)   Exercise Vital Sign    Days of Exercise per Week: 0 days    Minutes of Exercise per Session: 0 min  Stress: Stress Concern Present (04/17/2021)   [place]    Feeling of Stress : To some extent  Social Connections: Somewhat Isolated (04/10/2018)   Social Connection and Isolation Panel [NHANES]    Frequency of Communication with Friends and Family: More than three times a week    Frequency of Social Gatherings with Friends and Family: More than three times a week    Attends Religious Services: More than 4 times per year    Active Member of [employer] or Organizations:  No    Attends Archivist Meetings: Never    Marital Status: Divorced  Human resources officer Violence: Not At Risk (04/10/2018)   Humiliation, Afraid, Rape, and Kick questionnaire    Fear of Current or Ex-Partner: No    Emotionally Abused: No    Physically Abused: No    Sexually Abused: No   Social History   Tobacco Use  Smoking Status Never  Smokeless Tobacco Never   Social History   Substance and Sexual Activity  Alcohol Use No   Alcohol/week: 0.0 standard drinks of alcohol    Family History:  Family History  Problem Relation Age of Onset   Diabetes Mother    Cancer Mother        Pituitary tumor   Hyperlipidemia Mother    Hypertension Mother    Thyroid disease Mother    Stroke Mother    Cancer Father        Lung   Cancer Brother        Oral   Hypertension Son    Cancer Maternal Grandmother        colon   Blindness Maternal Grandfather    Arthritis Paternal Grandmother    Arthritis Daughter        RA   Breast cancer Neg Hx     Past medical history, surgical history, medications, allergies, family history and social history reviewed with patient today and changes made to appropriate areas of the chart.   Review of Systems - negative All other ROS negative except what is listed above and in the HPI.      Objective:    BP 109/71   Pulse 98 Comment: apical  Temp 99 F (37.2 C) (Oral)   Ht '5\' 3"'  (1.6 m)   Wt 208 lb 1.6 oz (94.4 kg)   LMP  (LMP Unknown)   SpO2 94%   BMI 36.86 kg/m   Wt Readings from Last 3 Encounters:  02/21/22 208 lb 1.6 oz (94.4 kg)  09/12/21 211 lb (95.7 kg)  08/15/21 213 lb 9.6 oz (96.9 kg)    Physical Exam Constitutional:      General: She is awake. She is not in acute distress.    Appearance: She is well-developed. She is not ill-appearing.  HENT:     Head: Normocephalic and atraumatic.     Right Ear: Hearing, tympanic membrane, ear canal and external ear normal. No drainage.     Left Ear: Hearing, tympanic membrane,  ear canal and external ear normal. No drainage.     Nose: Nose normal.     Right Sinus: No maxillary sinus tenderness or frontal sinus tenderness.     Left Sinus: No maxillary sinus tenderness or frontal sinus tenderness.     Mouth/Throat:     Mouth:  Mucous membranes are moist.     Pharynx: Oropharynx is clear. Uvula midline. No pharyngeal swelling, oropharyngeal exudate or posterior oropharyngeal erythema.  Eyes:     General: Lids are normal.        Right eye: No discharge.        Left eye: No discharge.     Extraocular Movements: Extraocular movements intact.     Conjunctiva/sclera: Conjunctivae normal.     Pupils: Pupils are equal, round, and reactive to light.     Visual Fields: Right eye visual fields normal and left eye visual fields normal.  Neck:     Thyroid: No thyromegaly.     Vascular: No carotid bruit.     Trachea: Trachea normal.  Cardiovascular:     Rate and Rhythm: Normal rate and regular rhythm.     Heart sounds: Normal heart sounds. No murmur heard.    No gallop.  Pulmonary:     Effort: Pulmonary effort is normal. No accessory muscle usage or respiratory distress.     Breath sounds: Normal breath sounds.  Chest:     Comments: Deferred per patient request. Abdominal:     General: Bowel sounds are normal. There is no distension.     Palpations: Abdomen is soft. There is no hepatomegaly.     Tenderness: There is no abdominal tenderness. There is no right CVA tenderness or left CVA tenderness.  Musculoskeletal:        General: Normal range of motion.     Cervical back: Normal range of motion and neck supple.     Right lower leg: No edema.     Left lower leg: No edema.  Lymphadenopathy:     Head:     Right side of head: No submental, submandibular, tonsillar, preauricular or posterior auricular adenopathy.     Left side of head: No submental, submandibular, tonsillar, preauricular or posterior auricular adenopathy.     Cervical: No cervical adenopathy.  Skin:     General: Skin is warm and dry.     Capillary Refill: Capillary refill takes less than 2 seconds.     Findings: No rash.  Neurological:     Mental Status: She is alert and oriented to person, place, and time.     Gait: Gait is intact.     Deep Tendon Reflexes: Reflexes are normal and symmetric.     Reflex Scores:      Brachioradialis reflexes are 2+ on the right side and 2+ on the left side.      Patellar reflexes are 2+ on the right side and 2+ on the left side. Psychiatric:        Attention and Perception: Attention normal.        Mood and Affect: Mood normal.        Speech: Speech normal.        Behavior: Behavior normal. Behavior is cooperative.        Thought Content: Thought content normal.        Judgment: Judgment normal.    Diabetic Foot Exam - Simple   Simple Foot Form Visual Inspection No deformities, no ulcerations, no other skin breakdown bilaterally: Yes Sensation Testing See comments: Yes Pulse Check See comments: Yes Comments 1+ DP and PT bilateral feet.  Sensation 3/10 to bilateral feet, significant neuropathy noted.    Results for orders placed or performed in visit on 02/21/22  Bayer DCA Hb A1c Waived  Result Value Ref Range   HB A1C (BAYER DCA -  WAIVED) 6.2 (H) 4.8 - 5.6 %      Assessment & Plan:   Problem List Items Addressed This Visit       Cardiovascular and Mediastinum   Hypertension associated with diabetes ([place])    Chronic, stable with BP well below goal on home readings and at goal in office. She does endorse some orthostatic dizziness on occasion.  Trial reduction in Valsartan-HCTZ to 80 MG-12.5 MG daily and continue Metoprolol at current dosing due to slightly elevated HR at baseline (?thyroid related).  LABS: CMP, CBC, TSH. Could consider discontinuation of HCTZ at upcoming visits, as BP well controlled and history of gout.  DASH diet focus at home and monitor BPs, bring levels to visit.  Return in 4 weeks.      Relevant Medications    rosuvastatin (CRESTOR) 20 MG tablet   valsartan-hydrochlorothiazide (DIOVAN-HCT) 80-12.5 MG tablet   metFORMIN (GLUCOPHAGE-XR) 500 MG 24 hr tablet   Other Relevant Orders   Bayer DCA Hb A1c Waived (Completed)   CBC with Differential/Platelet   Comprehensive metabolic panel   Aortic atherosclerosis (HCC)    Ongoing.  Noted on CT 09/18/19.  Continue to monitor and continue ASA + Lopid for prevention.      Relevant Medications   rosuvastatin (CRESTOR) 20 MG tablet   valsartan-hydrochlorothiazide (DIOVAN-HCT) 80-12.5 MG tablet   Other Relevant Orders   Comprehensive metabolic panel   Lipid Panel w/o Chol/HDL Ratio     Respiratory   Obstructive sleep apnea    Chronic, stable.  Continue nightly use, praised for this.         Endocrine   Hyperlipidemia associated with type 2 diabetes mellitus (HCC)    Chronic, stable.  Has not taken statin in past, will stop Lopid and start Rosuvastatin for further cholesterol control in her diabetes, educated her on this change and side effects to report.  Lipid panel today.        Relevant Medications   rosuvastatin (CRESTOR) 20 MG tablet   valsartan-hydrochlorothiazide (DIOVAN-HCT) 80-12.5 MG tablet   metFORMIN (GLUCOPHAGE-XR) 500 MG 24 hr tablet   Other Relevant Orders   Bayer DCA Hb A1c Waived (Completed)   Comprehensive metabolic panel   Lipid Panel w/o Chol/HDL Ratio   Hyperthyroidism    Ongoing.  Noted on past labs, recheck level today.  Has known thyroid nodules and with benign biopsy with ENT.  Consider referral to endocrinology for further assessment if ongoing low levels this check, discussed with patient.      Relevant Orders   TSH   T4, free   Multiple thyroid nodules    Refer to hyperthyroid plan of care.      Relevant Orders   TSH   T4, free   Type 2 diabetes mellitus with diabetic mononeuropathy, without long-term current use of insulin (HCC) - Primary    Chronic, ongoing.  A1c 6.2% today, staying stable, praised for this  and urine ALB 30 today (February 2023).  Will continue Metformin XR 1000 MG BID and discussed with her may reduce in future.  Focus on diabetic diet at home and regular exercise.  Continue Gabapentin daily for neuropathy, continue 900 MG at HS and 300 MG in morning + 300 MG at evening time + Duloxetine 60 MG daily.  Current CrCl = 147 based on recent labs.   Recommend she monitor feet closely for any wounds or skin breakdown, alert provider immediately if present.  Referral to neurology for further assessment.  Return in 4 weeks.  Relevant Medications   rosuvastatin (CRESTOR) 20 MG tablet   valsartan-hydrochlorothiazide (DIOVAN-HCT) 80-12.5 MG tablet   metFORMIN (GLUCOPHAGE-XR) 500 MG 24 hr tablet   Other Relevant Orders   Bayer DCA Hb A1c Waived (Completed)   Ambulatory referral to Neurology     Musculoskeletal and Integument   Osteopenia    Chronic, ongoing.  Noted on DEXA.  Continue Vitamin D supplement and check level today.  DEXA in 2026 next.      Relevant Orders   VITAMIN D 25 Hydroxy (Vit-D Deficiency, Fractures)     Other   B12 deficiency    Ongoing.  Continue daily supplement and recheck level today.  She is on chronic Metformin, which can cause lower B12 levels.  Educated her on this.  May consider monthly injections for a bit if level not improved with oral supplement.      Relevant Orders   Vitamin B12   Gout    Chronic, ongoing.  Continue Allopurinol at this time, but may consider reduction in future.  Uric acid ordered today.      Relevant Orders   Uric acid   Morbid obesity (HCC)    BMI 36.86.  Recommended eating smaller high protein, low fat meals more frequently and exercising 30 mins a day 5 times a week with a goal of 10-15lb weight loss in the next 3 months. Patient voiced their understanding and motivation to adhere to these recommendations.       Relevant Medications   metFORMIN (GLUCOPHAGE-XR) 500 MG 24 hr tablet   Vitamin D deficiency    Chronic,  stable.  Continue daily supplement and check Vit D level today.  DEXA in 2026 next.      Relevant Orders   VITAMIN D 25 Hydroxy (Vit-D Deficiency, Fractures)   Other Visit Diagnoses     Need for Td vaccine       Td vaccine today.   Relevant Orders   Td vaccine greater than or equal to 7yo preservative free IM   Encounter for annual physical exam           Follow up plan: Return in about 4 weeks (around 03/21/2022) for HTN/HLD.   LABORATORY TESTING:  - Pap smear: not applicable  IMMUNIZATIONS:   - Tdap: Tetanus vaccination status reviewed: provided today - Influenza: Up to date - Pneumovax: Up to date - Prevnar: Up to date - HPV: Not applicable - Zostavax vaccine: Up To Date  SCREENING: -Mammogram: Up to date  - Colonoscopy:  Up To Date - Bone Density: Up To Date  -Hearing Test: not applicable -Spirometry: Not applicable   PATIENT COUNSELING:   Advised to take 1 mg of folate supplement per day if capable of pregnancy.   Sexuality: Discussed sexually transmitted diseases, partner selection, use of condoms, avoidance of unintended pregnancy  and contraceptive alternatives.   Advised to avoid cigarette smoking.  I discussed with the patient that most people either abstain from alcohol or drink within safe limits (<=14/week and <=4 drinks/occasion for males, <=7/weeks and <= 3 drinks/occasion for females) and that the risk for alcohol disorders and other health effects rises proportionally with the number of drinks per week and how often a drinker exceeds daily limits.  Discussed cessation/primary prevention of drug use and availability of treatment for abuse.   Diet: Encouraged to adjust caloric intake to maintain  or achieve ideal body weight, to reduce intake of dietary saturated fat and total fat, to limit sodium intake by avoiding high  sodium foods and not adding table salt, and to maintain adequate dietary potassium and calcium preferably from fresh fruits,  vegetables, and low-fat dairy products.    Stressed the importance of regular exercise  Injury prevention: Discussed safety belts, safety helmets, smoke detector, smoking near bedding or upholstery.   Dental health: Discussed importance of regular tooth brushing, flossing, and dental visits.    NEXT PREVENTATIVE PHYSICAL DUE IN 1 YEAR. Return in about 4 weeks (around 03/21/2022) for HTN/HLD.

## 2022-02-21 NOTE — Assessment & Plan Note (Addendum)
Chronic, stable with BP well below goal on home readings and at goal in office. She does endorse some orthostatic dizziness on occasion.  Trial reduction in Valsartan-HCTZ to 80 MG-12.5 MG daily and continue Metoprolol at current dosing due to slightly elevated HR at baseline (?thyroid related).  LABS: CMP, CBC, TSH. Could consider discontinuation of HCTZ at upcoming visits, as BP well controlled and history of gout.  DASH diet focus at home and monitor BPs, bring levels to visit.  Return in 4 weeks.

## 2022-02-21 NOTE — Assessment & Plan Note (Signed)
Ongoing.  Noted on past labs, recheck level today.  Has known thyroid nodules and with benign biopsy with ENT.  Consider referral to endocrinology for further assessment if ongoing low levels this check, discussed with patient.

## 2022-02-21 NOTE — Progress Notes (Signed)
Contacted via MyChart   A1c remains nice and stable at goal = 6.2%.  Great job!!

## 2022-02-21 NOTE — Assessment & Plan Note (Signed)
Refer to hyperthyroid plan of care. 

## 2022-02-21 NOTE — Assessment & Plan Note (Signed)
Ongoing.  Noted on CT 09/18/19.  Continue to monitor and continue ASA + Lopid for prevention. 

## 2022-02-21 NOTE — Assessment & Plan Note (Signed)
Chronic, stable.  Continue daily supplement and check Vit D level today.  DEXA in 2026 next.

## 2022-02-21 NOTE — Assessment & Plan Note (Addendum)
Chronic, stable.  Continue nightly use, praised for this.  

## 2022-02-21 NOTE — Assessment & Plan Note (Signed)
Ongoing.  Continue daily supplement and recheck level today.  She is on chronic Metformin, which can cause lower B12 levels.  Educated her on this.  May consider monthly injections for a bit if level not improved with oral supplement.

## 2022-02-21 NOTE — Assessment & Plan Note (Signed)
Chronic, ongoing.  Noted on DEXA.  Continue Vitamin D supplement and check level today.  DEXA in 2026 next.

## 2022-02-21 NOTE — Assessment & Plan Note (Signed)
BMI 36.86.  Recommended eating smaller high protein, low fat meals more frequently and exercising 30 mins a day 5 times a week with a goal of 10-15lb weight loss in the next 3 months. Patient voiced their understanding and motivation to adhere to these recommendations.

## 2022-02-21 NOTE — Assessment & Plan Note (Signed)
Chronic, ongoing.  A1c 6.2% today, staying stable, praised for this and urine ALB 30 today (February 2023).  Will continue Metformin XR 1000 MG BID and discussed with her may reduce in future.  Focus on diabetic diet at home and regular exercise.  Continue Gabapentin daily for neuropathy, continue 900 MG at HS and 300 MG in morning + 300 MG at evening time + Duloxetine 60 MG daily.  Current CrCl = 147 based on recent labs.   Recommend she monitor feet closely for any wounds or skin breakdown, alert provider immediately if present.  Referral to neurology for further assessment.  Return in 4 weeks.

## 2022-02-21 NOTE — Assessment & Plan Note (Signed)
Chronic, ongoing.  Continue Allopurinol at this time, but may consider reduction in future.  Uric acid ordered today.

## 2022-02-21 NOTE — Assessment & Plan Note (Signed)
Chronic, stable.  Has not taken statin in past, will stop Lopid and start Rosuvastatin for further cholesterol control in her diabetes, educated her on this change and side effects to report.  Lipid panel today.

## 2022-02-22 ENCOUNTER — Encounter: Payer: Self-pay | Admitting: Nurse Practitioner

## 2022-02-22 LAB — COMPREHENSIVE METABOLIC PANEL
ALT: 13 IU/L (ref 0–32)
AST: 18 IU/L (ref 0–40)
Albumin/Globulin Ratio: 2 (ref 1.2–2.2)
Albumin: 4.5 g/dL (ref 3.8–4.8)
Alkaline Phosphatase: 48 IU/L (ref 44–121)
BUN/Creatinine Ratio: 16 (ref 12–28)
BUN: 9 mg/dL (ref 8–27)
Bilirubin Total: 0.4 mg/dL (ref 0.0–1.2)
CO2: 25 mmol/L (ref 20–29)
Calcium: 9.8 mg/dL (ref 8.7–10.3)
Chloride: 98 mmol/L (ref 96–106)
Creatinine, Ser: 0.55 mg/dL — ABNORMAL LOW (ref 0.57–1.00)
Globulin, Total: 2.2 g/dL (ref 1.5–4.5)
Glucose: 243 mg/dL — ABNORMAL HIGH (ref 70–99)
Potassium: 3.9 mmol/L (ref 3.5–5.2)
Sodium: 143 mmol/L (ref 134–144)
Total Protein: 6.7 g/dL (ref 6.0–8.5)
eGFR: 96 mL/min/{1.73_m2} (ref >59)

## 2022-02-22 LAB — CBC WITH DIFFERENTIAL/PLATELET
Basophils Absolute: 0 10*3/uL (ref 0.0–0.2)
Basos: 0 %
EOS (ABSOLUTE): 0.1 10*3/uL (ref 0.0–0.4)
Eos: 3 %
Hematocrit: 36.7 % (ref 34.0–46.6)
Hemoglobin: 12.2 g/dL (ref 11.1–15.9)
Immature Grans (Abs): 0 10*3/uL (ref 0.0–0.1)
Immature Granulocytes: 0 %
Lymphocytes Absolute: 1.6 10*3/uL (ref 0.7–3.1)
Lymphs: 36 %
MCH: 28.7 pg (ref 26.6–33.0)
MCHC: 33.2 g/dL (ref 31.5–35.7)
MCV: 86 fL (ref 79–97)
Monocytes Absolute: 0.3 10*3/uL (ref 0.1–0.9)
Monocytes: 7 %
Neutrophils Absolute: 2.4 10*3/uL (ref 1.4–7.0)
Neutrophils: 54 %
Platelets: 264 10*3/uL (ref 150–450)
RBC: 4.25 x10E6/uL (ref 3.77–5.28)
RDW: 12.9 % (ref 11.7–15.4)
WBC: 4.5 10*3/uL (ref 3.4–10.8)

## 2022-02-22 LAB — LIPID PANEL W/O CHOL/HDL RATIO
Cholesterol, Total: 124 mg/dL (ref 100–199)
HDL: 34 mg/dL — ABNORMAL LOW (ref >39)
LDL Chol Calc (NIH): 64 mg/dL (ref 0–99)
Triglycerides: 149 mg/dL (ref 0–149)
VLDL Cholesterol Cal: 26 mg/dL (ref 5–40)

## 2022-02-22 LAB — VITAMIN B12: Vitamin B-12: 648 pg/mL (ref 232–1245)

## 2022-02-22 LAB — VITAMIN D 25 HYDROXY (VIT D DEFICIENCY, FRACTURES): Vit D, 25-Hydroxy: 48.2 ng/mL (ref 30.0–100.0)

## 2022-02-22 LAB — URIC ACID: Uric Acid: 5.7 mg/dL (ref 3.1–7.9)

## 2022-02-22 LAB — TSH: TSH: 0.052 u[IU]/mL — ABNORMAL LOW (ref 0.450–4.500)

## 2022-02-22 LAB — T4, FREE: Free T4: 1.04 ng/dL (ref 0.82–1.77)

## 2022-02-22 NOTE — Addendum Note (Signed)
Addended by: Marnee Guarneri T on: 02/22/2022 03:42 PM   Modules accepted: Orders

## 2022-02-22 NOTE — Progress Notes (Signed)
Contacted via Person afternoon [place], your labs have returned and these are overall stable with exception of thyroid lab (TSH) this is lowest it has been and I believe it is time to get you into endocrinology.  I will place this referral and see where we can get you into as I know many endocrinology offices are very busy or not taking new patients.  Would you be okay with driving a little to see an endocrinology provider?  Let me know.  I am sending referral.  Any questions? Keep being amazing!!  Thank you for allowing me to participate in your care.  I appreciate you. Kindest regards, Rossy Virag

## 2022-02-23 ENCOUNTER — Encounter: Payer: Self-pay | Admitting: Nurse Practitioner

## 2022-02-27 NOTE — Progress Notes (Unsigned)
Letter mailed to patient.

## 2022-02-28 ENCOUNTER — Encounter: Payer: Self-pay | Admitting: Nurse Practitioner

## 2022-03-20 ENCOUNTER — Ambulatory Visit
Admission: RE | Admit: 2022-03-20 | Discharge: 2022-03-20 | Disposition: A | Discharge status: Home / Self Care | Payer: Medicare HMO | Source: Ambulatory Visit | Attending: Nurse Practitioner | Admitting: Nurse Practitioner

## 2022-03-20 DIAGNOSIS — Z1231 Encounter for screening mammogram for malignant neoplasm of breast: Secondary | ICD-10-CM | POA: Insufficient documentation

## 2022-03-21 NOTE — Progress Notes (Signed)
Contacted via MyChart   Normal mammogram, may repeat in one year:)

## 2022-03-22 ENCOUNTER — Ambulatory Visit: Payer: Medicare HMO | Admitting: Nurse Practitioner

## 2022-03-22 DIAGNOSIS — E042 Nontoxic multinodular goiter: Secondary | ICD-10-CM | POA: Diagnosis not present

## 2022-03-22 DIAGNOSIS — E061 Subacute thyroiditis: Secondary | ICD-10-CM | POA: Diagnosis not present

## 2022-03-22 DIAGNOSIS — E1165 Type 2 diabetes mellitus with hyperglycemia: Secondary | ICD-10-CM | POA: Diagnosis not present

## 2022-03-22 DIAGNOSIS — I10 Essential (primary) hypertension: Secondary | ICD-10-CM | POA: Diagnosis not present

## 2022-03-23 ENCOUNTER — Other Ambulatory Visit: Payer: Self-pay | Admitting: Nurse Practitioner

## 2022-03-23 NOTE — Telephone Encounter (Signed)
Refilled 03/13/2021 #[id number] rf. Requested Prescriptions  Pending Prescriptions Disp Refills  . gabapentin (NEURONTIN) 300 MG capsule [Pharmacy Med Name: GABAPENTIN 300 MG Capsule] 450 capsule 10    Sig: TAKE 1 CAPSULE EVERY MORNING, 1 CAPSULE AT 6 PM, AND 3 CAPSULES EVERY NIGHT BEFORE BEDTIME.     Neurology: Anticonvulsants - gabapentin Failed - 03/23/2022  1:07 PM      Failed - Cr in normal range and within 360 days    Creatinine, Ser  Date Value Ref Range Status  02/21/2022 0.55 (L) 0.57 - 1.00 mg/dL Final         Passed - Completed PHQ-2 or PHQ-9 in the last 360 days      Passed - Valid encounter within last 12 months    Recent Outpatient Visits          1 month ago Type 2 diabetes mellitus with diabetic mononeuropathy, without long-term current use of insulin ([place])   [place], [name] T, NP   6 months ago Type 2 diabetes mellitus with diabetic mononeuropathy, without long-term current use of insulin ([place])   [place], [name] T, NP   7 months ago Type 2 diabetes mellitus with diabetic mononeuropathy, without long-term current use of insulin ([place])   [place], [place] T, NP   8 months ago Obstructive sleep apnea   [place], [name] T, NP   10 months ago Type 2 diabetes mellitus with diabetic mononeuropathy, without long-term current use of insulin ([place])   [place], Barbaraann Faster, NP      Future Appointments            In 3 days [place], Barbaraann Faster, NP [facility]   In 3 weeks  [facility]

## 2022-03-25 NOTE — Patient Instructions (Signed)

## 2022-03-26 ENCOUNTER — Ambulatory Visit (INDEPENDENT_AMBULATORY_CARE_PROVIDER_SITE_OTHER): Payer: Medicare HMO | Admitting: Nurse Practitioner

## 2022-03-26 ENCOUNTER — Encounter: Payer: Self-pay | Admitting: Nurse Practitioner

## 2022-03-26 VITALS — BP 126/73 | HR 97 | Temp 99.3°F | Ht 63.0 in | Wt 211.4 lb

## 2022-03-26 DIAGNOSIS — E1159 Type 2 diabetes mellitus with other circulatory complications: Secondary | ICD-10-CM

## 2022-03-26 DIAGNOSIS — Z23 Encounter for immunization: Secondary | ICD-10-CM

## 2022-03-26 DIAGNOSIS — E0590 Thyrotoxicosis, unspecified without thyrotoxic crisis or storm: Secondary | ICD-10-CM

## 2022-03-26 DIAGNOSIS — I152 Hypertension secondary to endocrine disorders: Secondary | ICD-10-CM | POA: Diagnosis not present

## 2022-03-26 DIAGNOSIS — E1169 Type 2 diabetes mellitus with other specified complication: Secondary | ICD-10-CM

## 2022-03-26 DIAGNOSIS — E785 Hyperlipidemia, unspecified: Secondary | ICD-10-CM

## 2022-03-26 MED ORDER — GABAPENTIN 300 MG PO CAPS: ORAL_CAPSULE | ORAL | 4 refills | Status: DC

## 2022-03-26 MED ORDER — METOPROLOL SUCCINATE ER 50 MG PO TB24: 50.0000 mg | ORAL_TABLET | Freq: Every day | ORAL | 4 refills | Status: DC

## 2022-03-26 NOTE — Progress Notes (Signed)
BP 126/73   Pulse 97   Temp 99.3 F (37.4 C) (Oral)   Ht _0  (1.6 m)   Wt 211 lb 6.4 oz (95.9 kg)   LMP  (LMP Unknown)   SpO2 97%   BMI 37.45 kg/m    Subjective:    [name], female    DOB: 1945/11/11, 76 y.o.   MRN: [id number]  HPI: Catherine Hinton is a 76 y.o. female  Chief Complaint  Patient presents with   Hyperlipidemia   Hypertension    Patient says she has been keeping track of her BP readings at home and they have been fine.    Hypothyroidism    Patient says she feels her Thyroid has her feel bad and she has had dizzy spell this morning when she first stood up.    HYPERTENSION / HYPERLIPIDEMIA At recent visit reduced Valsartan dosing and started Crestor for improved cholesterol control with diabetes. Currently taking Metoprolol XL 25 MG daily, Diovan-HCT 80-12.5 MG daily + Crestor. Satisfied with current treatment? yes Duration of hypertension: chronic BP monitoring frequency: daily BP range: 101/61 to 142/78, on average 110-120/70 range BP medication side effects: no Duration of hyperlipidemia: chronic Cholesterol medication side effects: no Cholesterol supplements: none Medication compliance: good compliance Aspirin: no Recent stressors: no Recurrent headaches: no Visual changes: no Palpitations: beating more fast, not irregular Dyspnea: no Chest pain: no Lower extremity edema: no Dizzy/lightheaded: yes   HYPERTHYROIDISM Seen by Dr. Ronnald Collum for initial visit on 03/22/22 due to ongoing low TSH levels -- she reports no changes made yet, performed a bunch of labs.  To have yearly thyroid ultrasound, due next 03/27/22 = has been ordered by Dr. Richardson Landry on review.  History of biopsy to thyroid on 06/15/21, negative.  She reports noticing some dizziness this morning when she got up, feeling off this morning.   Fatigue: yes Cold intolerance: no Heat intolerance: yes Weight gain: no Weight loss: no Constipation: yes Diarrhea/loose  stools: no Palpitations: beating more fast, not irregular Lower extremity edema: no Anxiety/depressed mood: no   Relevant past medical, surgical, family and social history reviewed and updated as indicated. Interim medical history since our last visit reviewed. Allergies and medications reviewed and updated.  Review of Systems  Constitutional:  Negative for activity change, appetite change, diaphoresis, fatigue and fever.  Respiratory:  Negative for cough, chest tightness, shortness of breath and wheezing.   Cardiovascular:  Negative for chest pain, palpitations and leg swelling.  Gastrointestinal:  Positive for constipation.  Endocrine: Positive for heat intolerance. Negative for cold intolerance, polydipsia, polyphagia and polyuria.  Genitourinary: Negative.   Neurological:  Positive for dizziness.  Psychiatric/Behavioral: Negative.      Per HPI unless specifically indicated above     Objective:    BP 126/73   Pulse 97   Temp 99.3 F (37.4 C) (Oral)   Ht _1  (1.6 m)   Wt 211 lb 6.4 oz (95.9 kg)   LMP  (LMP Unknown)   SpO2 97%   BMI 37.45 kg/m   Wt Readings from Last 3 Encounters:  03/26/22 211 lb 6.4 oz (95.9 kg)  02/21/22 208 lb 1.6 oz (94.4 kg)  09/12/21 211 lb (95.7 kg)    Physical Exam Vitals and nursing note reviewed.  Constitutional:      General: She is awake. She is not in acute distress.    Appearance: She is well-developed. She is obese. She is not ill-appearing.  HENT:  Head: Normocephalic.     Right Ear: Hearing, tympanic membrane and ear canal normal. No drainage.     Left Ear: Hearing, tympanic membrane, ear canal and external ear normal. No drainage.     Nose: Nose normal.     Mouth/Throat:     Mouth: Mucous membranes are moist.     Pharynx: Oropharynx is clear.  Eyes:     General: Lids are normal.        Right eye: No discharge.        Left eye: No discharge.     Conjunctiva/sclera: Conjunctivae normal.     Pupils: Pupils are equal,  round, and reactive to light.  Neck:     Vascular: No carotid bruit.  Cardiovascular:     Rate and Rhythm: Regular rhythm. Tachycardia present.     Heart sounds: Normal heart sounds. No murmur heard.    No gallop.  Pulmonary:     Effort: Pulmonary effort is normal. No accessory muscle usage or respiratory distress.     Breath sounds: Normal breath sounds.  Abdominal:     General: Bowel sounds are normal.     Palpations: Abdomen is soft.  Musculoskeletal:     Cervical back: Normal range of motion and neck supple.     Right lower leg: No edema.     Left lower leg: No edema.  Lymphadenopathy:     Cervical: No cervical adenopathy.  Skin:    General: Skin is warm and dry.  Neurological:     Mental Status: She is alert and oriented to person, place, and time.  Psychiatric:        Attention and Perception: Attention normal.        Mood and Affect: Mood normal.        Behavior: Behavior normal. Behavior is cooperative.        Thought Content: Thought content normal.        Judgment: Judgment normal.    Results for orders placed or performed in visit on 02/21/22  Bayer DCA Hb A1c Waived  Result Value Ref Range   HB A1C (BAYER DCA - WAIVED) 6.2 (H) 4.8 - 5.6 %  CBC with Differential/Platelet  Result Value Ref Range   WBC 4.5 3.4 - 10.8 x10E3/uL   RBC 4.25 3.77 - 5.28 x10E6/uL   Hemoglobin 12.2 11.1 - 15.9 g/dL   Hematocrit 36.7 34.0 - 46.6 %   MCV 86 79 - 97 fL   MCH 28.7 26.6 - 33.0 pg   MCHC 33.2 31.5 - 35.7 g/dL   RDW 12.9 11.7 - 15.4 %   Platelets 264 150 - 450 x10E3/uL   Neutrophils 54 Not Estab. %   Lymphs 36 Not Estab. %   Monocytes 7 Not Estab. %   Eos 3 Not Estab. %   Basos 0 Not Estab. %   Neutrophils Absolute 2.4 1.4 - 7.0 x10E3/uL   Lymphocytes Absolute 1.6 0.7 - 3.1 x10E3/uL   Monocytes Absolute 0.3 0.1 - 0.9 x10E3/uL   EOS (ABSOLUTE) 0.1 0.0 - 0.4 x10E3/uL   Basophils Absolute 0.0 0.0 - 0.2 x10E3/uL   Immature Granulocytes 0 Not Estab. %   Immature Grans  (Abs) 0.0 0.0 - 0.1 x10E3/uL  Comprehensive metabolic panel  Result Value Ref Range   Glucose 243 (H) 70 - 99 mg/dL   BUN 9 8 - 27 mg/dL   Creatinine, Ser 0.55 (L) 0.57 - 1.00 mg/dL   eGFR 96 >59 mL/min/1.73   BUN/Creatinine Ratio 16  12 - 28   Sodium 143 134 - 144 mmol/L   Potassium 3.9 3.5 - 5.2 mmol/L   Chloride 98 96 - 106 mmol/L   CO2 25 20 - 29 mmol/L   Calcium 9.8 8.7 - 10.3 mg/dL   Total Protein 6.7 6.0 - 8.5 g/dL   Albumin 4.5 3.8 - 4.8 g/dL   Globulin, Total 2.2 1.5 - 4.5 g/dL   Albumin/Globulin Ratio 2.0 1.2 - 2.2   Bilirubin Total 0.4 0.0 - 1.2 mg/dL   Alkaline Phosphatase 48 44 - 121 IU/L   AST 18 0 - 40 IU/L   ALT 13 0 - 32 IU/L  Lipid Panel w/o Chol/HDL Ratio  Result Value Ref Range   Cholesterol, Total 124 100 - 199 mg/dL   Triglycerides 149 0 - 149 mg/dL   HDL 34 (L) >39 mg/dL   VLDL Cholesterol Cal 26 5 - 40 mg/dL   LDL Chol Calc (NIH) 64 0 - 99 mg/dL  TSH  Result Value Ref Range   TSH 0.052 (L) 0.450 - 4.500 uIU/mL  T4, free  Result Value Ref Range   Free T4 1.04 0.82 - 1.77 ng/dL  Uric acid  Result Value Ref Range   Uric Acid 5.7 3.1 - 7.9 mg/dL  VITAMIN D 25 Hydroxy (Vit-D Deficiency, Fractures)  Result Value Ref Range   Vit D, 25-Hydroxy 48.2 30.0 - 100.0 ng/mL  Vitamin B12  Result Value Ref Range   Vitamin B-12 648 232 - 1,245 pg/mL      Assessment & Plan:   Problem List Items Addressed This Visit       Cardiovascular and Mediastinum   Hypertension associated with diabetes ([place]) - Primary    Chronic, stable, tolerating reduction in medication.  Continue Valsartan-HCTZ 80 MG-12.5 MG daily and increase Metoprolol XL to 50 MG daily due to mild tachycardia (suspect thyroid related).  LABS: up to date. Could consider discontinuation of HCTZ at upcoming visits, as BP well controlled and history of gout.  DASH diet focus at home and monitor BPs, bring levels to visit.        Relevant Medications   metoprolol succinate (TOPROL-XL) 50 MG 24 hr  tablet     Endocrine   Hyperlipidemia associated with type 2 diabetes mellitus ([facility])    Chronic, stable.  Tolerating Crestor, will continue this and plan on recheck of lipid panel next diabetes visit.  Adjust dosing as needed.      Relevant Medications   metoprolol succinate (TOPROL-XL) 50 MG 24 hr tablet   Hyperthyroidism    Ongoing, symptomatic.  Continue collaboration with endocrinology, has initial visit on 03/22/22 and awaiting to hear next steps based on labs drawn with them.  Suspect will start treatment.  At this time increase Metoprolol XL to 50 MG daily due to mild tachycardia with her underlying hyperthyroid.  Return in 4 weeks.      Relevant Medications   metoprolol succinate (TOPROL-XL) 50 MG 24 hr tablet     Follow up plan: Return in about 4 weeks (around 04/23/2022) for Hyperthyroid.

## 2022-03-26 NOTE — Assessment & Plan Note (Signed)
Chronic, stable.  Tolerating Crestor, will continue this and plan on recheck of lipid panel next diabetes visit.  Adjust dosing as needed.

## 2022-03-26 NOTE — Assessment & Plan Note (Signed)
Ongoing, symptomatic.  Continue collaboration with endocrinology, has initial visit on 03/22/22 and awaiting to hear next steps based on labs drawn with them.  Suspect will start treatment.  At this time increase Metoprolol XL to 50 MG daily due to mild tachycardia with her underlying hyperthyroid.  Return in 4 weeks.

## 2022-03-26 NOTE — Assessment & Plan Note (Signed)
Chronic, stable, tolerating reduction in medication.  Continue Valsartan-HCTZ 80 MG-12.5 MG daily and increase Metoprolol XL to 50 MG daily due to mild tachycardia (suspect thyroid related).  LABS: up to date. Could consider discontinuation of HCTZ at upcoming visits, as BP well controlled and history of gout.  DASH diet focus at home and monitor BPs, bring levels to visit.

## 2022-04-04 DIAGNOSIS — E6609 Other obesity due to excess calories: Secondary | ICD-10-CM | POA: Diagnosis not present

## 2022-04-04 DIAGNOSIS — E042 Nontoxic multinodular goiter: Secondary | ICD-10-CM | POA: Diagnosis not present

## 2022-04-04 DIAGNOSIS — I10 Essential (primary) hypertension: Secondary | ICD-10-CM | POA: Diagnosis not present

## 2022-04-04 DIAGNOSIS — Z6834 Body mass index (BMI) 34.0-34.9, adult: Secondary | ICD-10-CM | POA: Diagnosis not present

## 2022-04-04 DIAGNOSIS — E1165 Type 2 diabetes mellitus with hyperglycemia: Secondary | ICD-10-CM | POA: Diagnosis not present

## 2022-04-04 DIAGNOSIS — E790 Hyperuricemia without signs of inflammatory arthritis and tophaceous disease: Secondary | ICD-10-CM | POA: Diagnosis not present

## 2022-04-04 DIAGNOSIS — F411 Generalized anxiety disorder: Secondary | ICD-10-CM | POA: Diagnosis not present

## 2022-04-04 DIAGNOSIS — G629 Polyneuropathy, unspecified: Secondary | ICD-10-CM | POA: Diagnosis not present

## 2022-04-17 DIAGNOSIS — E6609 Other obesity due to excess calories: Secondary | ICD-10-CM | POA: Diagnosis not present

## 2022-04-17 DIAGNOSIS — I10 Essential (primary) hypertension: Secondary | ICD-10-CM | POA: Diagnosis not present

## 2022-04-17 DIAGNOSIS — Z6834 Body mass index (BMI) 34.0-34.9, adult: Secondary | ICD-10-CM | POA: Diagnosis not present

## 2022-04-17 DIAGNOSIS — E1165 Type 2 diabetes mellitus with hyperglycemia: Secondary | ICD-10-CM | POA: Diagnosis not present

## 2022-04-17 DIAGNOSIS — E042 Nontoxic multinodular goiter: Secondary | ICD-10-CM | POA: Diagnosis not present

## 2022-04-17 DIAGNOSIS — D440 Neoplasm of uncertain behavior of thyroid gland: Secondary | ICD-10-CM | POA: Diagnosis not present

## 2022-04-17 DIAGNOSIS — F411 Generalized anxiety disorder: Secondary | ICD-10-CM | POA: Diagnosis not present

## 2022-04-17 DIAGNOSIS — G629 Polyneuropathy, unspecified: Secondary | ICD-10-CM | POA: Diagnosis not present

## 2022-04-17 DIAGNOSIS — E790 Hyperuricemia without signs of inflammatory arthritis and tophaceous disease: Secondary | ICD-10-CM | POA: Diagnosis not present

## 2022-04-19 ENCOUNTER — Ambulatory Visit (INDEPENDENT_AMBULATORY_CARE_PROVIDER_SITE_OTHER): Payer: Medicare HMO | Admitting: *Deleted

## 2022-04-19 DIAGNOSIS — Z0000 Encounter for general adult medical examination without abnormal findings: Secondary | ICD-10-CM

## 2022-04-19 NOTE — Progress Notes (Signed)
Subjective:   Gelila Well is a 76 y.o. female who presents for Medicare Annual (Subsequent) preventive examination.  I connected with  [place] on 04/19/22 by a telephone enabled telemedicine application and verified that I am speaking with the correct person using two identifiers.   I discussed the limitations of evaluation and management by telemedicine. The patient expressed understanding and agreed to proceed.  Patient location: home  Provider location:  Tele-health-home    Review of Systems     Cardiac Risk Factors include: advanced age (>37mn, >>51women);diabetes mellitus;family history of premature cardiovascular disease;obesity (BMI >30kg/m2);hypertension     Objective:    Today's Vitals   04/19/22 1236  PainSc: 3    There is no height or weight on file to calculate BMI.     04/17/2021    3:16 PM 02/01/2020    7:10 AM 12/25/2019   10:35 AM 04/10/2018   10:50 AM 04/04/2017   10:17 AM 04/25/2016   10:08 AM 04/25/2015   10:18 AM  Advanced Directives  Does Patient Have a Medical Advance Directive? No No No Yes Yes Yes   Type of AScientist, research (medical)Living will [place]    Copy of [place]?     No - copy requested    Would patient like information on creating a medical advance directive?  No - Patient declined No - Patient declined      Pre-existing out of facility DNR order (yellow form or pink MOST form)       Yellow form placed in chart (order not valid for inpatient use)    Current Medications (verified) Outpatient Encounter Medications as of 04/19/2022  Medication Sig   allopurinol (ZYLOPRIM) 300 MG tablet Take 1 tablet (300 mg total) by mouth daily.   aspirin 81 MG EC tablet Take 81 mg by mouth daily.    Blood Glucose Monitoring Suppl (TRUE METRIX AIR GLUCOSE METER) w/Device KIT Use to check blood sugar 2-3 times a day and document.  Bring log to visits.    Cholecalciferol 25 MCG (1000 UT) tablet Take 2,000 Units by mouth daily.    DHA-EPA-Flaxseed Oil-Vitamin E (THERA TEARS NUTRITION PO) Take 1 mg by mouth 2 (two) times daily.    DULoxetine (CYMBALTA) 30 MG capsule Start out with one capsule (30 MG) once daily by mouth for one week and if tolerating at week two may increase to 2 tablets (60 MG) by mouth daily.   gabapentin (NEURONTIN) 300 MG capsule TAKE 1 CAPSULE EVERY MORNING, AND THEN 1 CAPSULE AT 6 PM, AND THEN TAKE 3 CAPSULES EVERY NIGHT BEFORE BEDTIME.   glucose blood (TRUE METRIX BLOOD GLUCOSE TEST) test strip Use to check blood sugar 2-3 times a day and document.  Bring log to visits.   metFORMIN (GLUCOPHAGE-XR) 500 MG 24 hr tablet TAKE 2 TABLETS EVERY MORNING  AND TAKE 2 TABLETS AT BEDTIME   metoprolol succinate (TOPROL-XL) 50 MG 24 hr tablet Take 1 tablet (50 mg total) by mouth daily. Take with or immediately following a meal.   Multiple Vitamins-Minerals (EYE VITAMINS PO) Take 1 tablet by mouth daily.   rosuvastatin (CRESTOR) 20 MG tablet Take 1 tablet (20 mg total) by mouth daily.   TRUEplus Lancets 33G MISC Use to check blood sugar 2 to 3 times a day and document. Bring log to visits.   valsartan-hydrochlorothiazide (DIOVAN-HCT) 80-12.5 MG tablet Take 1 tablet by mouth daily.  vitamin B-12 (CYANOCOBALAMIN) 1000 MCG tablet Take 1,000 mcg by mouth daily.   XIIDRA 5 % SOLN Place 1 drop into both eyes 2 (two) times daily.    No facility-administered encounter medications on file as of 04/19/2022.    Allergies (verified) Niacin and Sulfa antibiotics   History: Past Medical History:  Diagnosis Date   Chronic kidney disease 04/25/2015   Diabetes mellitus with renal manifestation (HCC)    Diabetes mellitus without complication (HCC)    Glaucoma    Gout    Hyperlipidemia    Hypertension    Hypertensive CKD (chronic kidney disease)    Neuropathy    feet   Obesity    Sleep apnea    CPAP   Past Surgical History:  Procedure  Laterality Date   ABDOMINAL HYSTERECTOMY     BREAST CYST ASPIRATION     CHOLECYSTECTOMY     COLONOSCOPY WITH PROPOFOL N/A 02/01/2020   Procedure: COLONOSCOPY WITH PROPOFOL;  Surgeon: [name], MD;  Location: [facility];  Service: Endoscopy;  Laterality: N/A;  Diabetic - oral meds sleep apnea priority 4   HERNIA REPAIR     THUMB FUSION     Family History  Problem Relation Age of Onset   Diabetes Mother    Cancer Mother        Pituitary tumor   Hyperlipidemia Mother    Hypertension Mother    Thyroid disease Mother    Stroke Mother    Cancer Father        Lung   Cancer Brother        Oral   Hypertension Son    Cancer Maternal Grandmother        colon   Blindness Maternal Grandfather    Arthritis Paternal Grandmother    Arthritis Daughter        RA   Breast cancer Neg Hx    Social History   Socioeconomic History   Marital status: Divorced    Spouse name: Not on file   Number of children: Not on file   Years of education: Not on file   Highest education level: Associate degree: academic program  Occupational History   Occupation: retired  Tobacco Use   Smoking status: Never   Smokeless tobacco: Never  Vaping Use   Vaping Use: Never used  Substance and Sexual Activity   Alcohol use: No    Alcohol/week: 0.0 standard drinks of alcohol   Drug use: No   Sexual activity: Not Currently  Other Topics Concern   Not on file  Social History Narrative   Not on file   Social Determinants of Health   Financial Resource Strain: Low Risk  (04/19/2022)   Overall Financial Resource Strain (CARDIA)    Difficulty of Paying Living Expenses: Not hard at all  Food Insecurity: No Food Insecurity (04/19/2022)   Hunger Vital Sign    Worried About Running Out of Food in the Last Year: Never true    Ran Out of Food in the Last Year: Never true  Transportation Needs: Unmet Transportation Needs (04/19/2022)   PRAPARE - Hydrologist (Medical): Yes     Lack of Transportation (Non-Medical): No  Physical Activity: Inactive (04/19/2022)   Exercise Vital Sign    Days of Exercise per Week: 0 days    Minutes of Exercise per Session: 0 min  Stress: Stress Concern Present (04/19/2022)   [place]  Feeling of Stress : Very much  Social Connections: Socially Isolated (04/19/2022)   Social Connection and Isolation Panel [NHANES]    Frequency of Communication with Friends and Family: Three times a week    Frequency of Social Gatherings with Friends and Family: Once a week    Attends Religious Services: Never    Marine scientist or Organizations: No    Attends Music therapist: Never    Marital Status: Divorced    Tobacco Counseling Counseling given: Not Answered   Clinical Intake:  Pre-visit preparation completed: Yes  Pain : 0-10 Pain Score: 3  Pain Location: Foot Pain Orientation: Left, Right Pain Descriptors / Indicators: Burning, Dull, Aching, Numbness Pain Onset: More than a month ago Pain Frequency: Constant Pain Relieving Factors: gabapentin,cymbalta  Pain Relieving Factors: gabapentin,cymbalta  Nutritional Risks: None Diabetes: Yes CBG done?: No Did pt. bring in CBG monitor from home?: No  How often do you need to have someone help you when you read instructions, pamphlets, or other written materials from your doctor or pharmacy?: 1 - Never  Diabetic?  Yes  Nutrition Risk Assessment:  Has the patient had any N/V/D within the last 2 months?  No  Does the patient have any non-healing wounds?  No  Has the patient had any unintentional weight loss or weight gain?  No   Diabetes:  Is the patient diabetic?  Yes  If diabetic, was a CBG obtained today?  No  Did the patient bring in their glucometer from home?  No  How often do you monitor your CBG's? 5 times a week.   Financial Strains and Diabetes Management:  Are you having  any financial strains with the device, your supplies or your medication? No .  Does the patient want to be seen by Chronic Care Management for management of their diabetes?  No  Would the patient like to be referred to a Nutritionist or for Diabetic Management?  No   Diabetic Exams:  Diabetic Eye Exam: Completed . Pt has been advised about the importance in completing this exam.  Diabetic Foot Exam: Completed . Pt has been advised about the importance in completing this exam.   Interpreter Needed?: No  Information entered by :: Leroy Kennedy LPN   Activities of Daily Living    04/19/2022   12:42 PM 02/21/2022    9:29 AM  In your present state of health, do you have any difficulty performing the following activities:  Hearing? 0 0  Vision? 0 0  Difficulty concentrating or making decisions? 0 0  Walking or climbing stairs? 1 0  Dressing or bathing? 0 0  Doing errands, shopping? 0 0  Preparing Food and eating ? N   Using the Toilet? N   In the past six months, have you accidently leaked urine? Y   Do you have problems with loss of bowel control? N   Managing your Medications? N   Managing your Finances? N   Housekeeping or managing your Housekeeping? N     Patient Care Team: [name], NP as PCP - General (Nurse Practitioner) Anell Barr, OD (Optometry) Madelin Rear, [facility] (Inactive) as Pharmacist (Pharmacist)  Indicate any recent Medical Services you may have received from other than [facility] providers in the past year (date may be approximate).     Assessment:   This is a routine wellness examination for [place].  Hearing/Vision screen Hearing Screening - Comments:: No trouble hearing Vision Screening - Comments:: [facility] Up  to date  Dietary issues and exercise activities discussed: Current Exercise Habits: The patient does not participate in regular exercise at present   Goals Addressed             This Visit's Progress    Patient Stated       Wanted  to get to feeling normal again with thyroid       Depression Screen    04/19/2022   12:45 PM 03/26/2022   10:36 AM 02/21/2022    9:05 AM 09/12/2021    9:10 AM 08/15/2021    9:31 AM 07/10/2021    1:07 PM 05/17/2021   11:08 AM  PHQ 2/9 Scores  PHQ - 2 Score 2 2 0 0 2 0 0  PHQ- 9 Score _0 Fall Risk    04/19/2022   12:37 PM 03/26/2022   10:36 AM 02/21/2022    9:29 AM 02/21/2022    9:05 AM 09/12/2021    9:10 AM  Fall Risk   Falls in the past year? 0 0 0 0 0  Number falls in past yr: 0 0 0 0 0  Injury with Fall? 0 0 0 0 0  Risk for fall due to :  No Fall Risks No Fall Risks No Fall Risks No Fall Risks  Follow up Falls evaluation completed;Education provided;Falls prevention discussed Falls evaluation completed Falls prevention discussed Falls evaluation completed Falls evaluation completed    FALL RISK PREVENTION PERTAINING TO THE HOME:  Any stairs in or around the home? Yes  If so, are there any without handrails? No  Home free of loose throw rugs in walkways, pet beds, electrical cords, etc? Yes  Adequate lighting in your home to reduce risk of falls? Yes   ASSISTIVE DEVICES UTILIZED TO PREVENT FALLS:  Life alert? No  Use of a cane, walker or w/c? No  Grab bars in the bathroom? Yes  Shower chair or bench in shower? No  Elevated toilet seat or a handicapped toilet? No   TIMED UP AND GO:  Was the test performed? No .    Cognitive Function:        04/19/2022   12:39 PM 12/25/2019   10:41 AM 04/10/2018   10:50 AM 04/04/2017   10:20 AM  6CIT Screen  What Year? 0 points 0 points 0 points 0 points  What month? 0 points 0 points 0 points 0 points  What time? 0 points 0 points 0 points 0 points  Count back from 20 0 points 2 points 0 points 0 points  Months in reverse 0 points 0 points 0 points 0 points  Repeat phrase 4 points 0 points 0 points 0 points  Total Score 4 points 2 points 0 points 0 points    Immunizations Immunization History  Administered  Date(s) Administered   Fluad Quad(high Dose 65+) 03/24/2019, 04/08/2020, 03/13/2021   Influenza, High Dose Seasonal PF 04/25/2016, 04/04/2017, 02/18/2018   Influenza,inj,Quad PF,6+ Mos 04/25/2015   Influenza-Unspecified 04/18/2012   PFIZER(Purple Top)SARS-COV-2 Vaccination 07/29/2019, 08/19/2019, 03/20/2020, 12/03/2020, 04/28/2021   Pneumococcal Conjugate-13 04/20/2014   Pneumococcal Polysaccharide-23 10/26/2015   Pneumococcal-Unspecified 06/18/2006   Tdap 12/12/2010   Zoster Recombinat (Shingrix) 03/23/2021, 09/12/2021   Zoster, Live 09/21/2013    TDAP status: Due, Education has been provided regarding the importance of this vaccine. Advised may receive this vaccine at local pharmacy or Health Dept. Aware to provide a copy of the vaccination record if obtained from local pharmacy  or Health Dept. Verbalized acceptance and understanding.  Flu Vaccine status: Due, Education has been provided regarding the importance of this vaccine. Advised may receive this vaccine at local pharmacy or Health Dept. Aware to provide a copy of the vaccination record if obtained from local pharmacy or Health Dept. Verbalized acceptance and understanding.  Pneumococcal vaccine status: Up to date  Covid-19 vaccine status: Information provided on how to obtain vaccines.   Qualifies for Shingles Vaccine? No   Zostavax completed Yes   Shingrix Completed?: Yes  Screening Tests Health Maintenance  Topic Date Due   COVID-19 Vaccine (6 - Pfizer series) 06/23/2021   TETANUS/TDAP  08/15/2022 (Originally 12/11/2020)   INFLUENZA VACCINE  09/16/2022 (Originally 01/16/2022)   Diabetic kidney evaluation - Urine ACR  08/15/2022   HEMOGLOBIN A1C  08/22/2022   OPHTHALMOLOGY EXAM  08/30/2022   Diabetic kidney evaluation - GFR measurement  02/22/2023   FOOT EXAM  02/22/2023   MAMMOGRAM  03/21/2023   Medicare Annual Wellness (AWV)  04/20/2023   DEXA SCAN  02/02/2025   COLONOSCOPY (Pts 45-59yr Insurance coverage will need  to be confirmed)  01/31/2030   Pneumonia Vaccine 76 Years old  Completed   Hepatitis C Screening  Completed   Zoster Vaccines- Shingrix  Completed   HPV VACCINES  Aged Out    Health Maintenance  Health Maintenance Due  Topic Date Due   COVID-19 Vaccine (6 - Pfizer series) 06/23/2021    Colorectal cancer screening: Type of screening: Colonoscopy. Completed 2021. Repeat every 10 years  Mammogram status: Completed 2023. Repeat every year  Bone Density status: Completed 2021. Results reflect: Bone density results: OSTEOPENIA. Repeat every 5 years.  Lung Cancer Screening: (Low Dose CT Chest recommended if Age 74-80years, 30 pack-year currently smoking OR have quit w/in 15years.) does not qualify.   Lung Cancer Screening Referral:   Additional Screening:  Hepatitis C Screening: does qualify;  Vision Screening: Recommended annual ophthalmology exams for early detection of glaucoma and other disorders of the eye. Is the patient up to date with their annual eye exam?  Yes  Who is the provider or what is the name of the office in which the patient attends annual eye exams? [facility] If pt is not established with a provider, would they like to be referred to a provider to establish care? No .   Dental Screening: Recommended annual dental exams for proper oral hygiene  Community Resource Referral / Chronic Care Management: CRR required this visit?  No   CCM required this visit?  No      Plan:     I have personally reviewed and noted the following in the patient's chart:   Medical and social history Use of alcohol, tobacco or illicit drugs  Current medications and supplements including opioid prescriptions. Patient is not currently taking opioid prescriptions. Functional ability and status Nutritional status Physical activity Advanced directives List of other physicians Hospitalizations, surgeries, and ER visits in previous 12 months Vitals Screenings to include cognitive,  depression, and falls Referrals and appointments  In addition, I have reviewed and discussed with patient certain preventive protocols, quality metrics, and best practice recommendations. A written personalized care plan for preventive services as well as general preventive health recommendations were provided to patient.     JLeroy Kennedy LPN   [id number]  Nurse Notes:

## 2022-04-19 NOTE — Patient Instructions (Signed)
Catherine Hinton , Thank you for taking time to come for your Medicare Wellness Visit. I appreciate your ongoing commitment to your health goals. Please review the following plan we discussed and let me know if I can assist you in the future.   These are the goals we discussed:  Goals      Chronic Care Management -PharmD     CARE PLAN ENTRY (see longitudinal plan of care for additional care plan information)  Current Barriers:  Chronic Disease Management support, education, and care coordination needs related to Hypertension, Hyperlipidemia, Diabetes, Gout, and Vitamin D and B12 Deficiency   Hypertension BP Readings from Last 3 Encounters:  07/12/20 130/80  04/08/20 135/75  02/01/20 119/75  Pharmacist Clinical Goal(s): Over the next 90 days, patient will work with PharmD and providers to maintain BP goal <130/80 Current regimen:  Valsartan-hctz 160-12.'5mg'$  qd Metoprolol succinate 25 mg qd Interventions: reviewed Proper BP technique Provided diet and exercise counseling. Patient self care activities - Over the next 90 days, patient will: Check BP 1-2 times document, and provide at future appointments Ensure daily salt intake < 2300 mg/day  Hyperlipidemia Lab Results  Component Value Date/Time   LDLCALC 72 07/12/2020 09:47 AM  Pharmacist Clinical Goal(s): Over the next 90 days, patient will work with PharmD and providers to achieve LDL goal < 70 Current regimen:  Aspirin 81 mg daily Gemfibrozil '600mg'$  bid DHA-EPA-Flaxseed oil- Vit E 1200 mg qd Interventions:  Provided diet and exercise counseling. Reviewed signs of bleeding Patient self care activities - Over the next 90 days, patient will: Focus on eating a healty diet, increased vegetables and limited trans and saturated fats and sodium. Increase exercise as tolerated  Diabetes Lab Results  Component Value Date/Time   HGBA1C 6.7 07/12/2020 09:45 AM   HGBA1C 6.5 04/08/2020 09:32 AM   HGBA1C 6.8 11/06/2017 12:00 AM   Pharmacist Clinical Goal(s): Over the next 90 days, patient will work with PharmD and providers to maintain A1c goal <7% Current regimen:  Metformin 1000 mg twice daily(takes 2 '500mg'$  ) Gabapentin 300 mg three times daily Interventions: Provided diet and exercise counseling. Praised patient for maintaining goal A1c Patient self care activities - Over the next 90 days, patient will: Check blood sugar in the morning before eating or drinking, document, and provide at future appointments Contact provider with any episodes of hypoglycemia  Medication management Pharmacist Clinical Goal(s): Over the next 90 days, patient will work with PharmD and providers to maintain optimal medication adherence Current pharmacy: [facility] mail order Interventions Comprehensive medication review performed. Continue current medication management strategy Patient self care activities - Over the next 90 days, patient will: Focus on medication adherence by continued use of pill box. Take medications as prescribed Report any questions or concerns to PharmD and/or provider(s)  Please see past updates related to this goal by clicking on the "Past Updates" button in the selected goal       Exercise 150 minutes per week (moderate activity)     Monitor and Manage My Blood Sugar-Diabetes Type 2     Timeframe:  Long-Range Goal Priority:  High Start Date:                             Expected End Date:                       Follow Up Date 3 month visit   - check blood  sugar at prescribed times - check blood sugar if I feel it is too high or too low - take the blood sugar meter to all doctor visits    Why is this important?   Checking your blood sugar at home helps to keep it from getting very high or very low.  Writing the results in a diary or log helps the doctor know how to care for you.  Your blood sugar log should have the time, date and the results.  Also, write down the amount of insulin or other  medicine that you take.  Other information, like what you ate, exercise done and how you were feeling, will also be helpful.     Notes:      Patient Stated     12/25/2019, wants to eat better and move around more     Patient Stated     04/17/2021, no goals     Patient Stated     Wanted to get to feeling normal again with thyroid     Prevent Falls and Broken Bones-Osteoporosis     Timeframe:  Long-Range Goal Priority:  High Start Date:                             Expected End Date:                       Follow Up Date 3 month follow up    - always use handrails on the stairs - get at least 10 minutes of activity every day - make an emergency alert plan in case I fall    Why is this important?   When you fall, there are 3 things that control if a bone breaks or not.  These are the fall itself, how hard and the direction that you fall and how fragile your bones are.  Preventing falls is very important for you because of fragile bones.     Notes:      Track and Manage My Blood Pressure-Hypertension     Timeframe:  Long-Range Goal Priority:  High Start Date:                             Expected End Date:                       Follow Up Date 3 month follow up    - check blood pressure 3 times per week - write blood pressure results in a log or diary    Why is this important?   You won't feel high blood pressure, but it can still hurt your blood vessels.  High blood pressure can cause heart or kidney problems. It can also cause a stroke.  Making lifestyle changes like losing a little weight or eating less salt will help.  Checking your blood pressure at home and at different times of the day can help to control blood pressure.  If the doctor prescribes medicine remember to take it the way the doctor ordered.  Call the office if you cannot afford the medicine or if there are questions about it.     Notes:         This is a list of the screening recommended for you and  due dates:  Health Maintenance  Topic Date Due   COVID-19 Vaccine ([place]  series) 06/23/2021   Tetanus Vaccine  08/15/2022*   Flu Shot  09/16/2022*   Yearly kidney health urinalysis for diabetes  08/15/2022   Hemoglobin A1C  08/22/2022   Eye exam for diabetics  08/30/2022   Yearly kidney function blood test for diabetes  02/22/2023   Complete foot exam   02/22/2023   Mammogram  03/21/2023   Medicare Annual Wellness Visit  04/20/2023   DEXA scan (bone density measurement)  02/02/2025   Colon Cancer Screening  01/31/2030   Pneumonia Vaccine  Completed   Hepatitis C Screening: USPSTF Recommendation to screen - Ages 33-79 yo.  Completed   Zoster (Shingles) Vaccine  Completed   HPV Vaccine  Aged Out  *Topic was postponed. The date shown is not the original due date.    Advanced directives: Education provided  Conditions/risks identified:   Next appointment: Follow up in one year for your annual wellness visit 04-24-2022 @ 1:40  [place]   Preventive Care 65 Years and Older, Female Preventive care refers to lifestyle choices and visits with your health care provider that can promote health and wellness. What does preventive care include? A yearly physical exam. This is also called an annual well check. Dental exams once or twice a year. Routine eye exams. Ask your health care provider how often you should have your eyes checked. Personal lifestyle choices, including: Daily care of your teeth and gums. Regular physical activity. Eating a healthy diet. Avoiding tobacco and drug use. Limiting alcohol use. Practicing safe sex. Taking low-dose aspirin every day. Taking vitamin and mineral supplements as recommended by your health care provider. What happens during an annual well check? The services and screenings done by your health care provider during your annual well check will depend on your age, overall health, lifestyle risk factors, and family history of  disease. Counseling  Your health care provider may ask you questions about your: Alcohol use. Tobacco use. Drug use. Emotional well-being. Home and relationship well-being. Sexual activity. Eating habits. History of falls. Memory and ability to understand (cognition). Work and work Statistician. Reproductive health. Screening  You may have the following tests or measurements: Height, weight, and BMI. Blood pressure. Lipid and cholesterol levels. These may be checked every 5 years, or more frequently if you are over 74 years old. Skin check. Lung cancer screening. You may have this screening every year starting at age 52 if you have a 30-pack-year history of smoking and currently smoke or have quit within the past 15 years. Fecal occult blood test (FOBT) of the stool. You may have this test every year starting at age 35. Flexible sigmoidoscopy or colonoscopy. You may have a sigmoidoscopy every 5 years or a colonoscopy every 10 years starting at age 34. Hepatitis C blood test. Hepatitis B blood test. Sexually transmitted disease (STD) testing. Diabetes screening. This is done by checking your blood sugar (glucose) after you have not eaten for a while (fasting). You may have this done every 1-3 years. Bone density scan. This is done to screen for osteoporosis. You may have this done starting at age 21. Mammogram. This may be done every 1-2 years. Talk to your health care provider about how often you should have regular mammograms. Talk with your health care provider about your test results, treatment options, and if necessary, the need for more tests. Vaccines  Your health care provider may recommend certain vaccines, such as: Influenza vaccine. This is recommended every year. Tetanus, diphtheria, and acellular pertussis (Tdap, Td) vaccine.  You may need a Td booster every 10 years. Zoster vaccine. You may need this after age 69. Pneumococcal 13-valent conjugate (PCV13) vaccine. One  dose is recommended after age 10. Pneumococcal polysaccharide (PPSV23) vaccine. One dose is recommended after age 10. Talk to your health care provider about which screenings and vaccines you need and how often you need them. This information is not intended to replace advice given to you by your health care provider. Make sure you discuss any questions you have with your health care provider. Document Released: 07/01/2015 Document Revised: 02/22/2016 Document Reviewed: 04/05/2015 Elsevier Interactive Patient Education  2017 [place] Prevention in the Home Falls can cause injuries. They can happen to people of all ages. There are many things you can do to make your home safe and to help prevent falls. What can I do on the outside of my home? Regularly fix the edges of walkways and driveways and fix any cracks. Remove anything that might make you trip as you walk through a door, such as a raised step or threshold. Trim any bushes or trees on the path to your home. Use bright outdoor lighting. Clear any walking paths of anything that might make someone trip, such as rocks or tools. Regularly check to see if handrails are loose or broken. Make sure that both sides of any steps have handrails. Any raised decks and porches should have guardrails on the edges. Have any leaves, snow, or ice cleared regularly. Use sand or salt on walking paths during winter. Clean up any spills in your garage right away. This includes oil or grease spills. What can I do in the bathroom? Use night lights. Install grab bars by the toilet and in the tub and shower. Do not use towel bars as grab bars. Use non-skid mats or decals in the tub or shower. If you need to sit down in the shower, use a plastic, non-slip stool. Keep the floor dry. Clean up any water that spills on the floor as soon as it happens. Remove soap buildup in the tub or shower regularly. Attach bath mats securely with double-sided  non-slip rug tape. Do not have throw rugs and other things on the floor that can make you trip. What can I do in the bedroom? Use night lights. Make sure that you have a light by your bed that is easy to reach. Do not use any sheets or blankets that are too big for your bed. They should not hang down onto the floor. Have a firm chair that has side arms. You can use this for support while you get dressed. Do not have throw rugs and other things on the floor that can make you trip. What can I do in the kitchen? Clean up any spills right away. Avoid walking on wet floors. Keep items that you use a lot in easy-to-reach places. If you need to reach something above you, use a strong step stool that has a grab bar. Keep electrical cords out of the way. Do not use floor polish or wax that makes floors slippery. If you must use wax, use non-skid floor wax. Do not have throw rugs and other things on the floor that can make you trip. What can I do with my stairs? Do not leave any items on the stairs. Make sure that there are handrails on both sides of the stairs and use them. Fix handrails that are broken or loose. Make sure that handrails are as long as  the stairways. Check any carpeting to make sure that it is firmly attached to the stairs. Fix any carpet that is loose or worn. Avoid having throw rugs at the top or bottom of the stairs. If you do have throw rugs, attach them to the floor with carpet tape. Make sure that you have a light switch at the top of the stairs and the bottom of the stairs. If you do not have them, ask someone to add them for you. What else can I do to help prevent falls? Wear shoes that: Do not have high heels. Have rubber bottoms. Are comfortable and fit you well. Are closed at the toe. Do not wear sandals. If you use a stepladder: Make sure that it is fully opened. Do not climb a closed stepladder. Make sure that both sides of the stepladder are locked into place. Ask  someone to hold it for you, if possible. Clearly mark and make sure that you can see: Any grab bars or handrails. First and last steps. Where the edge of each step is. Use tools that help you move around (mobility aids) if they are needed. These include: Canes. Walkers. Scooters. Crutches. Turn on the lights when you go into a dark area. Replace any light bulbs as soon as they burn out. Set up your furniture so you have a clear path. Avoid moving your furniture around. If any of your floors are uneven, fix them. If there are any pets around you, be aware of where they are. Review your medicines with your doctor. Some medicines can make you feel dizzy. This can increase your chance of falling. Ask your doctor what other things that you can do to help prevent falls. This information is not intended to replace advice given to you by your health care provider. Make sure you discuss any questions you have with your health care provider. Document Released: 03/31/2009 Document Revised: 11/10/2015 Document Reviewed: 07/09/2014 Elsevier Interactive Patient Education  2017 [employer].

## 2022-04-22 NOTE — Patient Instructions (Signed)
Hyperthyroidism Hyperthyroidism refers to a thyroid gland that is too active or overactive. The thyroid gland is a small gland located in the lower front part of the neck, just in front of the windpipe (trachea). This gland makes hormones that: Help control how the body uses food for energy (metabolism). Help the heart and brain work well. Keep your bones strong. When the thyroid is overactive, it produces too much of a hormone called thyroxine. What are the causes? This condition may be caused by: Graves' disease. This is a disorder in which the body's disease-fighting system (immune system) attacks the thyroid gland. This is the most common cause. Inflammation of the thyroid gland. A tumor in the thyroid gland. Use of certain medicines, including: Prescription thyroid hormone replacement. Herbal supplements that mimic thyroid hormones. Amiodarone therapy. Solid or fluid-filled lumps within your thyroid gland (thyroid nodules). Taking in a large amount of iodine from foods or medicines. What increases the risk? You are more likely to develop this condition if: You are female. You have a family history of thyroid conditions. You smoke tobacco. You use a medicine called lithium. You take medicines that affect the immune system (immunosuppressants). What are the signs or symptoms? Symptoms of this condition include: Nervousness. Inability to tolerate heat. Diarrhea. Rapid heart rate. Shaky hands. Restlessness. Sleep problems. Other symptoms may include: Heart skipping beats or making extra beats. Unexplained weight loss. Change in the texture of hair or skin. Loss of menstruation. Fatigue. Enlarged thyroid gland or a lump in the thyroid (nodule). You may also have symptoms of Graves' disease, which may include: Protruding eyes. Dry eyes. Red or swollen eyes. Problems with vision. How is this diagnosed? This condition may be diagnosed based on: Your symptoms and medical  history. A physical exam. Blood tests. Thyroid ultrasound. This test involves using sound waves to produce images of the thyroid gland. A thyroid scan. A radioactive substance is injected into a vein, and images show how much iodine is present in the thyroid. Radioactive iodine uptake test (RAIU). A small amount of radioactive iodine is given by mouth to see how much iodine the thyroid absorbs after a certain amount of time. How is this treated? Treatment depends on the cause and severity of the condition. Treatment may include: Medicines to reduce the amount of thyroid hormone your body makes. Medicines to help manage your symptoms. Radioactive iodine treatment (radioiodine therapy). This involves swallowing a small dose of radioactive iodine, in capsule or liquid form, to kill thyroid cells. Surgery to remove part or all of your thyroid gland. You may need to take thyroid hormone replacement medicine for the rest of your life after thyroid surgery. Follow these instructions at home:  Take over-the-counter and prescription medicines only as told by your health care provider. Do not use any products that contain nicotine or tobacco. These products include cigarettes, chewing tobacco, and vaping devices, such as e-cigarettes. If you need help quitting, ask your health care provider. Follow any instructions from your health care provider about diet. You may be instructed to limit foods that contain iodine. Keep all follow-up visits. You will need to have blood tests regularly so that your health care provider can monitor your condition. Where to find more information National Institute of Diabetes and Digestive and Kidney Diseases: niddk.nih.gov Contact a health care provider if: Your symptoms do not get better with treatment. You have a fever. You have abdominal pain. You feel dizzy. You are taking thyroid hormone replacement medicine and: You have   symptoms of depression. You feel like you  are tired all the time. You gain weight. Get help right away if: You have sudden, unexplained confusion or other mental changes. You have chest pain. You have fast or irregular heartbeats (palpitations). You have difficulty breathing. These symptoms may be an emergency. Get help right away. Call 911. Do not wait to see if the symptoms will go away. Do not drive yourself to the hospital. Summary The thyroid gland is a small gland located in the lower front part of the neck, just in front of the windpipe. Hyperthyroidism is when the thyroid gland is too active and produces too much of a hormone called thyroxine. The most common cause is Graves' disease, a disorder in which your immune system attacks the thyroid gland. Hyperthyroidism can cause various symptoms, such as unexplained weight loss, nervousness, inability to tolerate heat, or changes in your heartbeat. Treatment may include medicine to reduce the amount of thyroid hormone your body makes, radioiodine therapy, surgery, or medicines to manage symptoms. This information is not intended to replace advice given to you by your health care provider. Make sure you discuss any questions you have with your health care provider. Document Revised: 07/28/2021 Document Reviewed: 07/28/2021 Elsevier Patient Education  2023 [employer]  

## 2022-04-24 ENCOUNTER — Encounter: Payer: Self-pay | Admitting: Nurse Practitioner

## 2022-04-24 ENCOUNTER — Ambulatory Visit (INDEPENDENT_AMBULATORY_CARE_PROVIDER_SITE_OTHER): Payer: Medicare HMO | Admitting: Nurse Practitioner

## 2022-04-24 VITALS — BP 124/75 | HR 99 | Temp 99.3°F | Ht 63.0 in | Wt 209.4 lb

## 2022-04-24 DIAGNOSIS — E790 Hyperuricemia without signs of inflammatory arthritis and tophaceous disease: Secondary | ICD-10-CM | POA: Diagnosis not present

## 2022-04-24 DIAGNOSIS — I10 Essential (primary) hypertension: Secondary | ICD-10-CM | POA: Diagnosis not present

## 2022-04-24 DIAGNOSIS — F411 Generalized anxiety disorder: Secondary | ICD-10-CM | POA: Diagnosis not present

## 2022-04-24 DIAGNOSIS — E042 Nontoxic multinodular goiter: Secondary | ICD-10-CM | POA: Diagnosis not present

## 2022-04-24 DIAGNOSIS — E6609 Other obesity due to excess calories: Secondary | ICD-10-CM | POA: Diagnosis not present

## 2022-04-24 DIAGNOSIS — G629 Polyneuropathy, unspecified: Secondary | ICD-10-CM | POA: Diagnosis not present

## 2022-04-24 DIAGNOSIS — E0590 Thyrotoxicosis, unspecified without thyrotoxic crisis or storm: Secondary | ICD-10-CM

## 2022-04-24 DIAGNOSIS — Z6834 Body mass index (BMI) 34.0-34.9, adult: Secondary | ICD-10-CM | POA: Diagnosis not present

## 2022-04-24 DIAGNOSIS — E1165 Type 2 diabetes mellitus with hyperglycemia: Secondary | ICD-10-CM | POA: Diagnosis not present

## 2022-04-24 NOTE — Progress Notes (Signed)
BP 124/75   Pulse 99   Temp 99.3 F (37.4 C) (Oral)   Ht _0  (1.6 m)   Wt 209 lb 6.4 oz (95 kg)   LMP  (LMP Unknown)   SpO2 95%   BMI 37.09 kg/m    Subjective:    [name], female    DOB: 04/06/1946, 76 y.o.   MRN: [id number]  HPI: Catherine Hinton is a 76 y.o. female  Chief Complaint  Patient presents with   Hyperthyroidism    Patient is here for a four week follow up on Hyperthyroidism.    HYPERTHYROIDISM Seen by Dr. Ronnald Collum for initial visit on 03/22/22 due to ongoing low TSH levels and then follow-up on 04/04/22 with note reviewed and plan for repeat biopsy due to concern recent imaging showing some growth in thyroid.  History of biopsy to thyroid on 06/15/21, negative.  Plan is to schedule at hospital for repeat biopsy.  Endo placed her on Armour thyroid for concerns she may have some hypothyroid mixed.  Returns to see endo in one month for labs. Fatigue: yes, constantly Cold intolerance: yes Heat intolerance: yes Weight gain: no Weight loss: no Constipation: yes Diarrhea/loose stools: no Palpitations: improved, better with increased Metoprolol Lower extremity edema: no Anxiety/depressed mood: no   Relevant past medical, surgical, family and social history reviewed and updated as indicated. Interim medical history since our last visit reviewed. Allergies and medications reviewed and updated.  Review of Systems  Constitutional:  Negative for activity change, appetite change, diaphoresis, fatigue and fever.  Respiratory:  Negative for cough, chest tightness, shortness of breath and wheezing.   Cardiovascular:  Negative for chest pain, palpitations and leg swelling.  Gastrointestinal:  Positive for constipation.  Endocrine: Positive for heat intolerance. Negative for cold intolerance, polydipsia, polyphagia and polyuria.  Genitourinary: Negative.   Neurological: Negative.   Psychiatric/Behavioral: Negative.      Per HPI unless specifically  indicated above     Objective:    BP 124/75   Pulse 99   Temp 99.3 F (37.4 C) (Oral)   Ht _1  (1.6 m)   Wt 209 lb 6.4 oz (95 kg)   LMP  (LMP Unknown)   SpO2 95%   BMI 37.09 kg/m   Wt Readings from Last 3 Encounters:  04/24/22 209 lb 6.4 oz (95 kg)  03/26/22 211 lb 6.4 oz (95.9 kg)  02/21/22 208 lb 1.6 oz (94.4 kg)    Physical Exam Vitals and nursing note reviewed.  Constitutional:      General: She is awake. She is not in acute distress.    Appearance: She is well-developed. She is obese. She is not ill-appearing.  HENT:     Head: Normocephalic.     Right Ear: Hearing, tympanic membrane and ear canal normal. No drainage.     Left Ear: Hearing, tympanic membrane, ear canal and external ear normal. No drainage.     Nose: Nose normal.     Mouth/Throat:     Mouth: Mucous membranes are moist.     Pharynx: Oropharynx is clear.  Eyes:     General: Lids are normal.        Right eye: No discharge.        Left eye: No discharge.     Conjunctiva/sclera: Conjunctivae normal.     Pupils: Pupils are equal, round, and reactive to light.  Neck:     Vascular: No carotid bruit.  Cardiovascular:     Rate and  Rhythm: Normal rate and regular rhythm.     Heart sounds: Normal heart sounds. No murmur heard.    No gallop.  Pulmonary:     Effort: Pulmonary effort is normal. No accessory muscle usage or respiratory distress.     Breath sounds: Normal breath sounds.  Abdominal:     General: Bowel sounds are normal.     Palpations: Abdomen is soft.  Musculoskeletal:     Cervical back: Normal range of motion and neck supple.     Right lower leg: No edema.     Left lower leg: No edema.  Lymphadenopathy:     Cervical: No cervical adenopathy.  Skin:    General: Skin is warm and dry.  Neurological:     Mental Status: She is alert and oriented to person, place, and time.  Psychiatric:        Attention and Perception: Attention normal.        Mood and Affect: Mood normal.         Behavior: Behavior normal. Behavior is cooperative.        Thought Content: Thought content normal.        Judgment: Judgment normal.    Results for orders placed or performed in visit on 02/21/22  Bayer DCA Hb A1c Waived  Result Value Ref Range   HB A1C (BAYER DCA - WAIVED) 6.2 (H) 4.8 - 5.6 %  CBC with Differential/Platelet  Result Value Ref Range   WBC 4.5 3.4 - 10.8 x10E3/uL   RBC 4.25 3.77 - 5.28 x10E6/uL   Hemoglobin 12.2 11.1 - 15.9 g/dL   Hematocrit 36.7 34.0 - 46.6 %   MCV 86 79 - 97 fL   MCH 28.7 26.6 - 33.0 pg   MCHC 33.2 31.5 - 35.7 g/dL   RDW 12.9 11.7 - 15.4 %   Platelets 264 150 - 450 x10E3/uL   Neutrophils 54 Not Estab. %   Lymphs 36 Not Estab. %   Monocytes 7 Not Estab. %   Eos 3 Not Estab. %   Basos 0 Not Estab. %   Neutrophils Absolute 2.4 1.4 - 7.0 x10E3/uL   Lymphocytes Absolute 1.6 0.7 - 3.1 x10E3/uL   Monocytes Absolute 0.3 0.1 - 0.9 x10E3/uL   EOS (ABSOLUTE) 0.1 0.0 - 0.4 x10E3/uL   Basophils Absolute 0.0 0.0 - 0.2 x10E3/uL   Immature Granulocytes 0 Not Estab. %   Immature Grans (Abs) 0.0 0.0 - 0.1 x10E3/uL  Comprehensive metabolic panel  Result Value Ref Range   Glucose 243 (H) 70 - 99 mg/dL   BUN 9 8 - 27 mg/dL   Creatinine, Ser 0.55 (L) 0.57 - 1.00 mg/dL   eGFR 96 >59 mL/min/1.73   BUN/Creatinine Ratio 16 12 - 28   Sodium 143 134 - 144 mmol/L   Potassium 3.9 3.5 - 5.2 mmol/L   Chloride 98 96 - 106 mmol/L   CO2 25 20 - 29 mmol/L   Calcium 9.8 8.7 - 10.3 mg/dL   Total Protein 6.7 6.0 - 8.5 g/dL   Albumin 4.5 3.8 - 4.8 g/dL   Globulin, Total 2.2 1.5 - 4.5 g/dL   Albumin/Globulin Ratio 2.0 1.2 - 2.2   Bilirubin Total 0.4 0.0 - 1.2 mg/dL   Alkaline Phosphatase 48 44 - 121 IU/L   AST 18 0 - 40 IU/L   ALT 13 0 - 32 IU/L  Lipid Panel w/o Chol/HDL Ratio  Result Value Ref Range   Cholesterol, Total 124 100 - 199 mg/dL  Triglycerides 149 0 - 149 mg/dL   HDL 34 (L) >39 mg/dL   VLDL Cholesterol Cal 26 5 - 40 mg/dL   LDL Chol Calc (NIH) 64 0 -  99 mg/dL  TSH  Result Value Ref Range   TSH 0.052 (L) 0.450 - 4.500 uIU/mL  T4, free  Result Value Ref Range   Free T4 1.04 0.82 - 1.77 ng/dL  Uric acid  Result Value Ref Range   Uric Acid 5.7 3.1 - 7.9 mg/dL  VITAMIN D 25 Hydroxy (Vit-D Deficiency, Fractures)  Result Value Ref Range   Vit D, 25-Hydroxy 48.2 30.0 - 100.0 ng/mL  Vitamin B12  Result Value Ref Range   Vitamin B-12 648 232 - 1,245 pg/mL      Assessment & Plan:   Problem List Items Addressed This Visit       Endocrine   Hyperthyroidism - Primary    Ongoing, symptomatic.  Continue collaboration with endocrinology, plan for biopsy upcoming.  At this time continue Metoprolol XL 50 MG daily due to mild tachycardia with her underlying hyperthyroid and continue Armour as prescribed by endo.  Return in 6 weeks.      Relevant Medications   thyroid (ARMOUR) 30 MG tablet     Follow up plan: Return in about 6 weeks (around 06/04/2022) for T2DM, HTN/HLD, THYROID.

## 2022-04-24 NOTE — Assessment & Plan Note (Addendum)
Ongoing, symptomatic.  Continue collaboration with endocrinology, plan for biopsy upcoming.  At this time continue Metoprolol XL 50 MG daily due to mild tachycardia with her underlying hyperthyroid and continue Armour as prescribed by endo.  Return in 6 weeks.

## 2022-04-25 ENCOUNTER — Other Ambulatory Visit: Payer: Self-pay | Admitting: Endocrinology

## 2022-04-25 ENCOUNTER — Encounter: Payer: Self-pay | Admitting: Endocrinology

## 2022-04-25 ENCOUNTER — Telehealth: Payer: Self-pay | Admitting: Endocrinology

## 2022-04-25 DIAGNOSIS — E041 Nontoxic single thyroid nodule: Secondary | ICD-10-CM

## 2022-04-26 ENCOUNTER — Encounter: Payer: Self-pay | Admitting: Endocrinology

## 2022-04-26 ENCOUNTER — Ambulatory Visit
Admission: RE | Admit: 2022-04-26 | Discharge: 2022-04-26 | Disposition: A | Discharge status: Home / Self Care | Payer: Medicare HMO | Source: Ambulatory Visit | Attending: Endocrinology | Admitting: Endocrinology

## 2022-04-26 DIAGNOSIS — E042 Nontoxic multinodular goiter: Secondary | ICD-10-CM | POA: Diagnosis not present

## 2022-04-26 DIAGNOSIS — E041 Nontoxic single thyroid nodule: Secondary | ICD-10-CM | POA: Diagnosis not present

## 2022-04-30 ENCOUNTER — Ambulatory Visit (INDEPENDENT_AMBULATORY_CARE_PROVIDER_SITE_OTHER): Payer: Medicare HMO

## 2022-04-30 DIAGNOSIS — H02883 Meibomian gland dysfunction of right eye, unspecified eyelid: Secondary | ICD-10-CM | POA: Diagnosis not present

## 2022-04-30 DIAGNOSIS — H02886 Meibomian gland dysfunction of left eye, unspecified eyelid: Secondary | ICD-10-CM | POA: Diagnosis not present

## 2022-04-30 DIAGNOSIS — I152 Hypertension secondary to endocrine disorders: Secondary | ICD-10-CM

## 2022-04-30 DIAGNOSIS — H353131 Nonexudative age-related macular degeneration, bilateral, early dry stage: Secondary | ICD-10-CM | POA: Diagnosis not present

## 2022-04-30 DIAGNOSIS — Z0100 Encounter for examination of eyes and vision without abnormal findings: Secondary | ICD-10-CM | POA: Diagnosis not present

## 2022-04-30 DIAGNOSIS — H04123 Dry eye syndrome of bilateral lacrimal glands: Secondary | ICD-10-CM | POA: Diagnosis not present

## 2022-04-30 DIAGNOSIS — E1169 Type 2 diabetes mellitus with other specified complication: Secondary | ICD-10-CM

## 2022-04-30 DIAGNOSIS — E0590 Thyrotoxicosis, unspecified without thyrotoxic crisis or storm: Secondary | ICD-10-CM

## 2022-04-30 DIAGNOSIS — H40013 Open angle with borderline findings, low risk, bilateral: Secondary | ICD-10-CM | POA: Diagnosis not present

## 2022-04-30 DIAGNOSIS — E119 Type 2 diabetes mellitus without complications: Secondary | ICD-10-CM | POA: Diagnosis not present

## 2022-04-30 DIAGNOSIS — H26492 Other secondary cataract, left eye: Secondary | ICD-10-CM | POA: Diagnosis not present

## 2022-04-30 NOTE — Progress Notes (Signed)
Chronic Care Management Pharmacy Note  04/30/2022 Name:  Catherine Hinton MRN:  [id number] DOB:  07/09/1945  Summary: -76 year old female presents for f/u CCM visit. Retired from nursing in 2011. Lives alone but has 3 children and 3 grandchildren who she sees often. 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 She also makes quilts -Reads a lot  Recommendations/Changes made from today's visit: -On NP Thyroid but is $60. Will ask Endo for cheaper alternative -Eye drops are very expensive. Asked her if we could do PAP for her and she told me her Optometrist will do it for her -Non-compliant on Metoprolol but patient denies it  Subjective: Catherine Hinton is an 76 y.o. year old female who is a primary patient of Cannady, Barbaraann Faster, NP.  The CCM team was consulted for assistance with disease management and care coordination needs.    Engaged with patient by telephone for follow up visit in response to provider referral for pharmacy case management and/or care coordination services.   Consent to Services:  The patient was given information about Chronic Care Management services, agreed to services, and gave verbal consent prior to initiation of services.  Please see initial visit note for detailed documentation.   Patient Care Team: [name], NP as PCP - General (Nurse Practitioner) Anell Barr, OD (Optometry) Lane Hacker, [facility] (Pharmacist)  Objective:  Lab Results  Component Value Date   CREATININE 0.55 (L) 02/21/2022   CREATININE 0.62 08/15/2021   CREATININE 0.52 (L) 05/17/2021    Lab Results  Component Value Date   HGBA1C 6.2 (H) 02/21/2022   Last diabetic Eye exam:  Lab Results  Component Value Date/Time   HMDIABEYEEXA No Retinopathy 08/29/2021 12:00 AM    Last diabetic Foot exam:  Lab Results  Component Value Date/Time   HMDIABFOOTEX PP 11/02/2014 12:00 AM        Component Value Date/Time   CHOL 124  02/21/2022 0944   CHOL 225 (H) 12/09/2018 0820   TRIG 149 02/21/2022 0944   TRIG 115 12/09/2018 0820   HDL 34 (L) 02/21/2022 0944   VLDL 23 12/09/2018 0820   LDLCALC 64 02/21/2022 0944       Latest Ref Rng & Units 02/21/2022    9:44 AM 08/15/2021    9:34 AM 02/07/2021    9:03 AM  Hepatic Function  Total Protein 6.0 - 8.5 g/dL 6.7  6.5  6.9   Albumin 3.8 - 4.8 g/dL 4.5  4.4  4.8   AST 0 - 40 IU/L _0 ALT 0 - 32 IU/L _1 Alk Phosphatase 44 - 121 IU/L 48  44  48   Total Bilirubin 0.0 - 1.2 mg/dL 0.4  0.3  0.7     Lab Results  Component Value Date/Time   TSH 0.052 (L) 02/21/2022 09:44 AM   TSH 0.414 (L) 08/15/2021 09:34 AM   FREET4 1.04 02/21/2022 09:44 AM   FREET4 1.05 08/15/2021 09:34 AM       Latest Ref Rng & Units 02/21/2022    9:44 AM 02/07/2021    9:03 AM 10/02/2019   10:43 AM  CBC  WBC 3.4 - 10.8 x10E3/uL 4.5  5.6  5.4   Hemoglobin 11.1 - 15.9 g/dL 12.2  13.3  13.3   Hematocrit 34.0 - 46.6 % 36.7  40.5  39.6   Platelets 150 - 450 x10E3/uL 264  338  319  Lab Results  Component Value Date/Time   VD25OH 48.2 02/21/2022 09:44 AM   VD25OH 46.3 02/07/2021 09:03 AM    Clinical ASCVD:  The ASCVD Risk score (Arnett DK, et al., 2019) failed to calculate for the following reasons:   The valid total cholesterol range is 130 to 320 mg/dL    Other: (CHADS2VASc if Afib, PHQ9 if depression, MMRC or CAT for COPD, ACT, DEXA)  Social History   Tobacco Use  Smoking Status Never  Smokeless Tobacco Never   BP Readings from Last 3 Encounters:  04/24/22 124/75  03/26/22 126/73  02/21/22 109/71   Pulse Readings from Last 3 Encounters:  04/24/22 99  03/26/22 97  02/21/22 98   Wt Readings from Last 3 Encounters:  04/24/22 209 lb 6.4 oz (95 kg)  03/26/22 211 lb 6.4 oz (95.9 kg)  02/21/22 208 lb 1.6 oz (94.4 kg)    Assessment: Review of patient past medical history, allergies, medications, health status, including review of consultants reports,  laboratory and other test data, was performed as part of comprehensive evaluation and provision of chronic care management services.   SDOH:  (Social Determinants of Health) assessments and interventions performed: Yes, today.  Care Gaps: Needs foot exam  Adherence review: no fill gaps for RAASi or Metformin. PDCs 100. Not on statin rx w/ dm - made recommendation to PCP.  SDOH Interventions    Flowsheet Row Chronic Care Management from 04/30/2022 in [facility] Clinical Support from 04/19/2022 in [facility] Chronic Care Management from 10/30/2021 in [facility] Office Visit from 05/05/2018 in [facility]  SDOH Interventions      Food Insecurity Interventions -- Intervention Not Indicated Intervention Not Indicated --  Housing Interventions -- Intervention Not Indicated -- --  Transportation Interventions -- Intervention Not Indicated Other (Comment)  [THN transportion number provided] --  Utilities Interventions -- Intervention Not Indicated -- --  Alcohol Usage Interventions -- Intervention Not Indicated (Score <7) -- --  Depression Interventions/Treatment  -- -- -- Patient refuses Treatment  Financial Strain Interventions Other (Comment)  [PAP and coordiantion with providers] Intervention Not Indicated -- --  Physical Activity Interventions Other (Comments)  [Will focus on this once Thyroid under control] Intervention Not Indicated -- --  Stress Interventions -- Intervention Not Indicated -- --       SDOH Interventions    Flowsheet Row Chronic Care Management from 04/30/2022 in [facility] Clinical Support from 04/19/2022 in [facility] Chronic Care Management from 10/30/2021 in [facility] Office Visit from 05/05/2018 in [facility]  SDOH Interventions      Food Insecurity Interventions -- Intervention Not Indicated Intervention Not Indicated --  Housing Interventions -- Intervention  Not Indicated -- --  Transportation Interventions -- Intervention Not Indicated Other (Comment)  [THN transportion number provided] --  Utilities Interventions -- Intervention Not Indicated -- --  Alcohol Usage Interventions -- Intervention Not Indicated (Score <7) -- --  Depression Interventions/Treatment  -- -- -- Patient refuses Treatment  Financial Strain Interventions Other (Comment)  [PAP and coordiantion with providers] Intervention Not Indicated -- --  Physical Activity Interventions Other (Comments)  [Will focus on this once Thyroid under control] Intervention Not Indicated -- --  Stress Interventions -- Intervention Not Indicated -- --       CCM Care Plan  Allergies  Allergen Reactions   Niacin Itching   Sulfa Antibiotics Other (See Comments)    Fever    Medications Reviewed Today       Reviewed by Lane Hacker, [facility] (Pharmacist) on 04/30/22 at [place] List Status: <None>   Medication Order Taking? Sig Documenting Provider Last Dose Status Informant  allopurinol (ZYLOPRIM) 300 MG tablet [id number] No Take 1 tablet (300 mg total) by mouth daily. Marnee Guarneri T, NP Taking Active   aspirin 81 MG EC tablet [id number] No Take 81 mg by mouth daily.  [provider] Taking Active   Blood Glucose Monitoring Suppl (TRUE METRIX AIR GLUCOSE METER) w/Device KIT [id number] No Use to check blood sugar 2-3 times a day and document.  Bring log to visits. Marnee Guarneri T, NP Taking Active   Cholecalciferol 25 MCG (1000 UT) tablet [id number] No Take 2,000 Units by mouth daily.  [provider] Taking Active            Med Note Amado Coe   Wed Mar 29, 2021 10:15 AM)    DHA-EPA-Flaxseed Oil-Vitamin E Creed Copper TEARS NUTRITION PO) [id number] No Take 1 mg by mouth 2 (two) times daily.  [provider] Taking Active   DULoxetine (CYMBALTA) 30 MG capsule [id number] No Start out with one capsule (30 MG) once daily by mouth for one week and if tolerating at week  two may increase to 2 tablets (60 MG) by mouth daily. Marnee Guarneri T, NP Taking Active   gabapentin (NEURONTIN) 300 MG capsule [id number] No TAKE 1 CAPSULE EVERY MORNING, AND THEN 1 CAPSULE AT 6 PM, AND THEN TAKE 3 CAPSULES EVERY NIGHT BEFORE BEDTIME. Marnee Guarneri T, NP Taking Active   glucose blood (TRUE METRIX BLOOD GLUCOSE TEST) test strip [id number] No Use to check blood sugar 2-3 times a day and document.  Bring log to visits. Marnee Guarneri T, NP Taking Active   metFORMIN (GLUCOPHAGE-XR) 500 MG 24 hr tablet [id number] No TAKE 2 TABLETS EVERY MORNING  AND TAKE 2 TABLETS AT BEDTIME Cannady, [name] T, NP Taking Active   metoprolol succinate (TOPROL-XL) 50 MG 24 hr tablet [id number] No Take 1 tablet (50 mg total) by mouth daily. Take with or immediately following a meal. Cannady, [name] T, NP Taking Active   Multiple Vitamins-Minerals (EYE VITAMINS PO) [id number] No Take 1 tablet by mouth daily. [provider] Taking Active   rosuvastatin (CRESTOR) 20 MG tablet [id number] No Take 1 tablet (20 mg total) by mouth daily. Marnee Guarneri T, NP Taking Active   thyroid (ARMOUR) 30 MG tablet [id number]  Take 15 mg by mouth daily before breakfast. [provider]  Active   TRUEplus Lancets 33G MISC [id number] No Use to check blood sugar 2 to 3 times a day and document. Bring log to visits. Marnee Guarneri T, NP Taking Active   valsartan-hydrochlorothiazide (DIOVAN-HCT) 80-12.5 MG tablet [id number] No Take 1 tablet by mouth daily. Marnee Guarneri T, NP Taking Active   vitamin B-12 (CYANOCOBALAMIN) 1000 MCG tablet [id number] No Take 1,000 mcg by mouth daily. [provider] Taking Active   XIIDRA 5 % SOLN [id number] No Place 1 drop into both eyes 2 (two) times daily.  [provider] Taking Active Self           Med Note Ovidio Kin Mar 29, 2021 10:16 AM)              Patient Active Problem List   Diagnosis Date Noted   Hyperthyroidism 05/17/2021    Multiple thyroid nodules 03/28/2021   Aortic atherosclerosis ([place]) 02/04/2021   Osteopenia 04/08/2020   B12 deficiency 10/04/2019   Gout  06/24/2019   Vitamin D deficiency 05/05/2018   Type 2 diabetes mellitus with diabetic mononeuropathy, without long-term current use of insulin ([place]) 11/06/2017   Hypertension associated with diabetes ([place]) 10/26/2015   Dermatochalasis of both upper eyelids 07/21/2015   Squamous blepharitis of upper and lower eyelids of both eyes 07/21/2015   Obstructive sleep apnea 04/22/2015   Hyperlipidemia associated with type 2 diabetes mellitus ([place]) 04/22/2015   Morbid obesity ([place]) 04/22/2015    Immunization History  Administered Date(s) Administered   Fluad Quad(high Dose 65+) 03/24/2019, 04/08/2020, 03/13/2021   Influenza, High Dose Seasonal PF 04/25/2016, 04/04/2017, 02/18/2018   Influenza,inj,Quad PF,6+ Mos 04/25/2015   Influenza-Unspecified 04/18/2012   PFIZER(Purple Top)SARS-COV-2 Vaccination 07/29/2019, 08/19/2019, 03/20/2020, 12/03/2020, 04/28/2021   Pneumococcal Conjugate-13 04/20/2014   Pneumococcal Polysaccharide-23 10/26/2015   Pneumococcal-Unspecified 06/18/2006   Tdap 12/12/2010   Zoster Recombinat (Shingrix) 03/23/2021, 09/12/2021   Zoster, Live 09/21/2013    Conditions to be addressed/monitored: HTN, HLD, DMII, and GERD  Care Plan : [place]  Updates made by Lane Hacker, [place] since 04/30/2022 12:00 AM     Problem: DM, HTN, HLD, Osteopenia, gout   Priority: High     Long-Range Goal: Disease Management   Start Date: 10/30/2021  Recent Progress: On track  Priority: High  Note:   Current Barriers:  Unable to achieve control of Hyperlipidemia  Unable to maintain routine exercise plan  Pharmacist Clinical Goal(s):  Patient will verbalize ability to afford treatment regimen contact provider office for questions/concerns as evidenced notation of same in electronic health record through collaboration with PharmD and  provider.   Interventions: 1:1 collaboration with [name], NP regarding development and update of comprehensive plan of care as evidenced by provider attestation and co-signature Inter-disciplinary care team collaboration (see longitudinal plan of care) Comprehensive medication review performed; medication list updated in electronic medical record  Hypertension (BP goal <130/80) BP Readings from Last 3 Encounters:  04/24/22 124/75  03/26/22 126/73  02/21/22 109/71  -Controlled -OSA/CPAP - insurance issues, sleep studies booked out, ended up paying for CPAP herself.  -Current treatment: Valsartan-hctz 160-12.5 mg once daily Appropriate, Effective, Safe, Accessible Metoprolol succinate 50 mg daily Appropriate, Effective, Safe, Accessible -Current home readings: continues with 263F-354T systolic at home  -Denies hypotensive/hypertensive symptoms -Counseled to monitor BP at home 1-2x/week, document, and provide log at future appointments November 2023: Non-compliant on Metoprolol. 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%) Lab Results  Component Value Date   HGBA1C 6.2 (H) 02/21/2022   HGBA1C 6.5 (H) 08/15/2021   HGBA1C 6.3 (H) 05/17/2021   Lab Results  Component Value Date   MICROALBUR 30 (H) 08/15/2021   LDLCALC 64 02/21/2022   CREATININE 0.55 (L) 02/21/2022   Lab Results  Component Value Date   NA 143 02/21/2022   K 3.9 02/21/2022   CREATININE 0.55 (L) 02/21/2022   EGFR 96 02/21/2022   GFRNONAA 91 07/12/2020   GLUCOSE 243 (H) 02/21/2022   Lab Results  Component Value Date   WBC 4.5 02/21/2022   HGB 12.2 02/21/2022   HCT 36.7 02/21/2022   MCV 86 02/21/2022   PLT 264 02/21/2022   Lab Results  Component Value Date   MICROALBUR 30 (H) 08/15/2021   MICROALBUR 10 07/12/2020  -Controlled -Current medications: Metformin XR 500 mg - two tabs BID  (switched from IR to XR 01/2021) Appropriate, Effective, Safe, Accessible Duloxetine DR 60  mg once daily  Appropriate, Effective, Safe, Accessible -Medications previously tried: Metformin IR (GI pain) -Current home glucose readings fasting glucose:  May 2023:  124, 108 November 2023: 04/30/22: 110 -Denies hypoglycemic/hyperglycemic symptoms -Current meal patterns:  Nov 2022: update: appetite has gotten better, good intake of vegetables.  May 2023: vegetable garden, kale, spinach, lettuce, strawberries; minimal sweets, chips - might swap for nuts -Current exercise:  Nov 2022: no updates Aug 2022: limited due to recent GI pain - has f/u scheduled for this issue May 2023: worried about nerve pain, risk of falling. Would consider PT to help with this -Educated on A1c and blood sugar goals; Benefits of routine self-monitoring of blood sugar; -Recommended to continue current medication November 2023: Recommend PT to help with nerve pain. Patient wasn't that open to speaking with me that much today. She was running late for something else  Thyroid (Goal TSH: 0.4-4.5 Lab Results  Component Value Date   TSH 0.052 (L) 02/21/2022   T4TOTAL 6.8 11/03/2019  -Is patient taking Biotin supplement No  -Uncontrolled -Current treatment: NP Thyroid Appropriate, Query effective, Safe, Query accessible November 2023: Is $60 for 90 days.  -Counseled to take medication on an empty stomach November 2023: Will coordinate with Endo to let them know med is expensive and ask about alternatives  Dry Eyes (Goal: Improve symptoms) -Managed by Dr. Woodard -Not ideally controlled -Current treatment  Xiidra (lifitegrast ophthalmic solution) Appropriate, Effective, Safe, Accessible $500/bottle Was getting PAP for 2023 Tyrvaya (Varenicline nose spray) Appropriate, Effective, Safe, Query accessible $100/bottle -Medications previously tried: N/A  November 2023: Was getting lifitegrast through PAP for 2023.  Received letter in the mail stating, "[employer] has divested [place] rights to [employer]" and will no longer do PAP for 2024. I told her that's no problem and that we'll do the PAP through there. She wanted Dr. Woodard to be in charge of that.   Medication Assistance: None required.  Patient affirms current coverage meets needs.     Patient's preferred pharmacy is:  [facility] - [place], [place] - [address] [place] [address] Phone: [phone] Fax: [phone]  [id number] - [place] - [address] [place] [address] Phone: [phone] Fax: [phone]  Uses pill box?  Pt endorses 100% compliance  Follow Up:  Patient agrees to Care Plan and Follow-up.  Plan:  HC f/u PRN. CP 6m.  Future Appointments  Date Time Provider Department Center  06/06/2022  1:40 PM Cannady, [name] T, NP [facility]  10/22/2022  9:00 AM [facility]   Nathan Kennedy, Pharm.D. - [phone]      

## 2022-04-30 NOTE — Patient Instructions (Signed)
Visit Information   Goals Addressed   None    Patient Care Plan: CCM Pharmacy Care Plan     Problem Identified: DM, HTN, HLD, Osteopenia, gout   Priority: High     Long-Range Goal: Disease Management   Start Date: 10/30/2021  Recent Progress: On track  Priority: High  Note:   Current Barriers:  Unable to achieve control of Hyperlipidemia  Unable to maintain routine exercise plan  Pharmacist Clinical Goal(s):  Patient will verbalize ability to afford treatment regimen contact provider office for questions/concerns as evidenced notation of same in electronic health record through collaboration with PharmD and provider.   Interventions: 1:1 collaboration with [name], NP regarding development and update of comprehensive plan of care as evidenced by provider attestation and co-signature Inter-disciplinary care team collaboration (see longitudinal plan of care) Comprehensive medication review performed; medication list updated in electronic medical record  Hypertension (BP goal <130/80) BP Readings from Last 3 Encounters:  04/24/22 124/75  03/26/22 126/73  02/21/22 109/71  -Controlled -OSA/CPAP - insurance issues, sleep studies booked out, ended up paying for CPAP herself.  -Current treatment: Valsartan-hctz 160-12.5 mg once daily Appropriate, Effective, Safe, Accessible Metoprolol succinate 50 mg daily Appropriate, Effective, Safe, Accessible -Current home readings: continues with [id number] systolic at home  -Denies hypotensive/hypertensive symptoms -Counseled to monitor BP at home 1-2x/week, document, and provide log at future appointments November 2023: Non-compliant on Metoprolol. 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%) Lab Results  Component Value Date   HGBA1C 6.2 (H) 02/21/2022   HGBA1C 6.5 (H) 08/15/2021   HGBA1C 6.3 (H) 05/17/2021   Lab  Results  Component Value Date   MICROALBUR 30 (H) 08/15/2021   LDLCALC 64 02/21/2022   CREATININE 0.55 (L) 02/21/2022   Lab Results  Component Value Date   NA 143 02/21/2022   K 3.9 02/21/2022   CREATININE 0.55 (L) 02/21/2022   EGFR 96 02/21/2022   GFRNONAA 91 07/12/2020   GLUCOSE 243 (H) 02/21/2022   Lab Results  Component Value Date   WBC 4.5 02/21/2022   HGB 12.2 02/21/2022   HCT 36.7 02/21/2022   MCV 86 02/21/2022   PLT 264 02/21/2022   Lab Results  Component Value Date   MICROALBUR 30 (H) 08/15/2021   MICROALBUR 10 07/12/2020  -Controlled -Current medications: Metformin XR 500 mg - two tabs BID (switched from IR to XR 01/2021) Appropriate, Effective, Safe, Accessible Duloxetine DR 60 mg once daily  Appropriate, Effective, Safe, Accessible -Medications previously tried: Metformin IR (GI pain) -Current home glucose readings fasting glucose:  May 2023:  124, 108 November 2023: 04/30/22: 110 -Denies hypoglycemic/hyperglycemic symptoms -Current meal patterns:  Nov 2022: update: appetite has gotten better, good intake of vegetables.  May 2023: vegetable garden, kale, spinach, lettuce, strawberries; minimal sweets, chips - might swap for nuts -Current exercise:  Nov 2022: no updates Aug 2022: limited due to recent GI pain - has f/u scheduled for this issue May 2023: worried about nerve pain, risk of falling. Would consider PT to help with this -Educated on A1c and blood sugar goals; Benefits of routine self-monitoring of blood sugar; -Recommended to continue current medication November 2023: Recommend PT to help with nerve pain. Patient wasn't that open to speaking with me that much today. She was running late for something else  Thyroid (Goal TSH: 0.4-4.5 Lab Results  Component Value Date   TSH 0.052 (L) 02/21/2022  T4TOTAL 6.8 11/03/2019  -Is patient taking Biotin supplement No  -Uncontrolled -Current treatment: NP Thyroid Appropriate, Query effective,  Safe, Query accessible November 2023: Is $60 for 90 days.  -Counseled to take medication on an empty stomach November 2023: Will coordinate with Endo to let them know med is expensive and ask about alternatives  Dry Eyes (Goal: Improve symptoms) -Managed by Dr. [name] -Not ideally controlled -Current treatment  Xiidra (lifitegrast ophthalmic solution) Appropriate, Effective, Safe, Accessible $500/bottle Was getting PAP for 2023 Tyrvaya (Varenicline nose spray) Appropriate, Effective, Safe, Query accessible $100/bottle -Medications previously tried: N/A  November 2023: Was getting lifitegrast through PAP for 2023. Received letter in the mail stating, "[employer] has divested [place] rights to [facility]" and will no longer do PAP for 2024. I told her that's no problem and that we'll do the PAP through there. She wanted Dr. [name] to be in charge of that.   Medication Assistance: None required.  Patient affirms current coverage meets needs.     Catherine Hinton was given information about Chronic Care Management services today including:  CCM service includes personalized support from designated clinical staff supervised by her physician, including individualized plan of care and coordination with other care providers 24/7 contact phone numbers for assistance for urgent and routine care needs. Standard insurance, coinsurance, copays and deductibles apply for chronic care management only during months in which we provide at least 20 minutes of these services. Most insurances cover these services at 100%, however patients may be responsible for any copay, coinsurance and/or deductible if applicable. This service may help you avoid the need for more expensive face-to-face services. Only one practitioner may furnish and bill the service in a calendar month. The patient may stop CCM services at any time (effective at the end of the month) by phone call to the office staff.  Patient agreed to services  and verbal consent obtained.   The patient verbalized understanding of instructions, educational materials, and care plan provided today and DECLINED offer to receive copy of patient instructions, educational materials, and care plan.  The pharmacy team will reach out to the patient again over the next 60 days.   Catherine Hinton, [place]

## 2022-05-15 DIAGNOSIS — E1349 Other specified diabetes mellitus with other diabetic neurological complication: Secondary | ICD-10-CM | POA: Diagnosis not present

## 2022-05-15 DIAGNOSIS — R278 Other lack of coordination: Secondary | ICD-10-CM | POA: Diagnosis not present

## 2022-05-15 DIAGNOSIS — R202 Paresthesia of skin: Secondary | ICD-10-CM | POA: Diagnosis not present

## 2022-05-15 DIAGNOSIS — R200 Anesthesia of skin: Secondary | ICD-10-CM | POA: Diagnosis not present

## 2022-05-17 DIAGNOSIS — E1159 Type 2 diabetes mellitus with other circulatory complications: Secondary | ICD-10-CM

## 2022-05-17 DIAGNOSIS — E785 Hyperlipidemia, unspecified: Secondary | ICD-10-CM

## 2022-05-17 DIAGNOSIS — I152 Hypertension secondary to endocrine disorders: Secondary | ICD-10-CM

## 2022-05-17 DIAGNOSIS — E1169 Type 2 diabetes mellitus with other specified complication: Secondary | ICD-10-CM

## 2022-05-18 NOTE — Telephone Encounter (Signed)
error 

## 2022-05-22 DIAGNOSIS — E042 Nontoxic multinodular goiter: Secondary | ICD-10-CM | POA: Diagnosis not present

## 2022-05-29 DIAGNOSIS — I10 Essential (primary) hypertension: Secondary | ICD-10-CM | POA: Diagnosis not present

## 2022-05-29 DIAGNOSIS — F411 Generalized anxiety disorder: Secondary | ICD-10-CM | POA: Diagnosis not present

## 2022-05-29 DIAGNOSIS — E1165 Type 2 diabetes mellitus with hyperglycemia: Secondary | ICD-10-CM | POA: Diagnosis not present

## 2022-05-29 DIAGNOSIS — E6609 Other obesity due to excess calories: Secondary | ICD-10-CM | POA: Diagnosis not present

## 2022-05-29 DIAGNOSIS — Z6834 Body mass index (BMI) 34.0-34.9, adult: Secondary | ICD-10-CM | POA: Diagnosis not present

## 2022-05-29 DIAGNOSIS — E790 Hyperuricemia without signs of inflammatory arthritis and tophaceous disease: Secondary | ICD-10-CM | POA: Diagnosis not present

## 2022-05-29 DIAGNOSIS — E042 Nontoxic multinodular goiter: Secondary | ICD-10-CM | POA: Diagnosis not present

## 2022-05-29 DIAGNOSIS — G629 Polyneuropathy, unspecified: Secondary | ICD-10-CM | POA: Diagnosis not present

## 2022-06-03 NOTE — Patient Instructions (Signed)
Hyperthyroidism Hyperthyroidism refers to a thyroid gland that is too active or overactive. The thyroid gland is a small gland located in the lower front part of the neck, just in front of the windpipe (trachea). This gland makes hormones that: Help control how the body uses food for energy (metabolism). Help the heart and brain work well. Keep your bones strong. When the thyroid is overactive, it produces too much of a hormone called thyroxine. What are the causes? This condition may be caused by: Graves' disease. This is a disorder in which the body's disease-fighting system (immune system) attacks the thyroid gland. This is the most common cause. Inflammation of the thyroid gland. A tumor in the thyroid gland. Use of certain medicines, including: Prescription thyroid hormone replacement. Herbal supplements that mimic thyroid hormones. Amiodarone therapy. Solid or fluid-filled lumps within your thyroid gland (thyroid nodules). Taking in a large amount of iodine from foods or medicines. What increases the risk? You are more likely to develop this condition if: You are female. You have a family history of thyroid conditions. You smoke tobacco. You use a medicine called lithium. You take medicines that affect the immune system (immunosuppressants). What are the signs or symptoms? Symptoms of this condition include: Nervousness. Inability to tolerate heat. Diarrhea. Rapid heart rate. Shaky hands. Restlessness. Sleep problems. Other symptoms may include: Heart skipping beats or making extra beats. Unexplained weight loss. Change in the texture of hair or skin. Loss of menstruation. Fatigue. Enlarged thyroid gland or a lump in the thyroid (nodule). You may also have symptoms of Graves' disease, which may include: Protruding eyes. Dry eyes. Red or swollen eyes. Problems with vision. How is this diagnosed? This condition may be diagnosed based on: Your symptoms and medical  history. A physical exam. Blood tests. Thyroid ultrasound. This test involves using sound waves to produce images of the thyroid gland. A thyroid scan. A radioactive substance is injected into a vein, and images show how much iodine is present in the thyroid. Radioactive iodine uptake test (RAIU). A small amount of radioactive iodine is given by mouth to see how much iodine the thyroid absorbs after a certain amount of time. How is this treated? Treatment depends on the cause and severity of the condition. Treatment may include: Medicines to reduce the amount of thyroid hormone your body makes. Medicines to help manage your symptoms. Radioactive iodine treatment (radioiodine therapy). This involves swallowing a small dose of radioactive iodine, in capsule or liquid form, to kill thyroid cells. Surgery to remove part or all of your thyroid gland. You may need to take thyroid hormone replacement medicine for the rest of your life after thyroid surgery. Follow these instructions at home:  Take over-the-counter and prescription medicines only as told by your health care provider. Do not use any products that contain nicotine or tobacco. These products include cigarettes, chewing tobacco, and vaping devices, such as e-cigarettes. If you need help quitting, ask your health care provider. Follow any instructions from your health care provider about diet. You may be instructed to limit foods that contain iodine. Keep all follow-up visits. You will need to have blood tests regularly so that your health care provider can monitor your condition. Where to find more information National Institute of Diabetes and Digestive and Kidney Diseases: niddk.nih.gov Contact a health care provider if: Your symptoms do not get better with treatment. You have a fever. You have abdominal pain. You feel dizzy. You are taking thyroid hormone replacement medicine and: You have   symptoms of depression. You feel like you  are tired all the time. You gain weight. Get help right away if: You have sudden, unexplained confusion or other mental changes. You have chest pain. You have fast or irregular heartbeats (palpitations). You have difficulty breathing. These symptoms may be an emergency. Get help right away. Call 911. Do not wait to see if the symptoms will go away. Do not drive yourself to the hospital. Summary The thyroid gland is a small gland located in the lower front part of the neck, just in front of the windpipe. Hyperthyroidism is when the thyroid gland is too active and produces too much of a hormone called thyroxine. The most common cause is Graves' disease, a disorder in which your immune system attacks the thyroid gland. Hyperthyroidism can cause various symptoms, such as unexplained weight loss, nervousness, inability to tolerate heat, or changes in your heartbeat. Treatment may include medicine to reduce the amount of thyroid hormone your body makes, radioiodine therapy, surgery, or medicines to manage symptoms. This information is not intended to replace advice given to you by your health care provider. Make sure you discuss any questions you have with your health care provider. Document Revised: 07/28/2021 Document Reviewed: 07/28/2021 Elsevier Patient Education  2023 [employer]  

## 2022-06-06 ENCOUNTER — Encounter: Payer: Self-pay | Admitting: Nurse Practitioner

## 2022-06-06 ENCOUNTER — Ambulatory Visit (INDEPENDENT_AMBULATORY_CARE_PROVIDER_SITE_OTHER): Payer: Medicare HMO | Admitting: Nurse Practitioner

## 2022-06-06 VITALS — BP 114/73 | HR 94 | Temp 98.6°F | Ht 62.99 in | Wt 210.6 lb

## 2022-06-06 DIAGNOSIS — E1169 Type 2 diabetes mellitus with other specified complication: Secondary | ICD-10-CM | POA: Diagnosis not present

## 2022-06-06 DIAGNOSIS — E1159 Type 2 diabetes mellitus with other circulatory complications: Secondary | ICD-10-CM

## 2022-06-06 DIAGNOSIS — G4733 Obstructive sleep apnea (adult) (pediatric): Secondary | ICD-10-CM | POA: Diagnosis not present

## 2022-06-06 DIAGNOSIS — E042 Nontoxic multinodular goiter: Secondary | ICD-10-CM

## 2022-06-06 DIAGNOSIS — M85851 Other specified disorders of bone density and structure, right thigh: Secondary | ICD-10-CM

## 2022-06-06 DIAGNOSIS — Z23 Encounter for immunization: Secondary | ICD-10-CM

## 2022-06-06 DIAGNOSIS — E1141 Type 2 diabetes mellitus with diabetic mononeuropathy: Secondary | ICD-10-CM

## 2022-06-06 DIAGNOSIS — E785 Hyperlipidemia, unspecified: Secondary | ICD-10-CM | POA: Diagnosis not present

## 2022-06-06 DIAGNOSIS — I700 Atherosclerosis of aorta: Secondary | ICD-10-CM | POA: Diagnosis not present

## 2022-06-06 DIAGNOSIS — I152 Hypertension secondary to endocrine disorders: Secondary | ICD-10-CM | POA: Diagnosis not present

## 2022-06-06 DIAGNOSIS — E0590 Thyrotoxicosis, unspecified without thyrotoxic crisis or storm: Secondary | ICD-10-CM | POA: Diagnosis not present

## 2022-06-06 LAB — BAYER DCA HB A1C WAIVED: HB A1C (BAYER DCA - WAIVED): 6.7 % — ABNORMAL HIGH (ref 4.8–5.6)

## 2022-06-06 NOTE — Assessment & Plan Note (Addendum)
Chronic, stable.  Tolerating Crestor, will continue this and plan on recheck of lipid panel today.  Adjust dosing as needed.

## 2022-06-06 NOTE — Assessment & Plan Note (Signed)
Refer to hyperthyroid plan of care.

## 2022-06-06 NOTE — Assessment & Plan Note (Signed)
Chronic, stable.  BP at goal.  Continue Valsartan-HCTZ 80 MG-12.5 MG daily and Metoprolol XL 50 MG daily due to tachycardia (thyroid related).  LABS: BMP. Could consider discontinuation of HCTZ at upcoming visits, as BP well controlled and history of gout.  DASH diet focus at home and monitor BPs, bring levels to visit.  Return in 3 months.

## 2022-06-06 NOTE — Assessment & Plan Note (Signed)
Chronic, ongoing.  Noted on DEXA.  Continue Vitamin D supplement and check level next visit.  DEXA in 2026 next.

## 2022-06-06 NOTE — Assessment & Plan Note (Signed)
Chronic, ongoing.  A1c 6.7% today, staying stable, praised for this and urine ALB 30 today (February 2023).  Will continue Metformin XR 1000 MG BID and discussed with her may reduce in future.  Focus on diabetic diet at home and regular exercise.  Continue Gabapentin daily for neuropathy, continue 900 MG at HS and 300 MG in morning + 300 MG at evening time + Duloxetine 60 MG daily.  Current CrCl = 147 based on recent labs.   Recommend she monitor feet closely for any wounds or skin breakdown, alert provider immediately if present.  Continue collaboration with neurology.  Return in 3 months.

## 2022-06-06 NOTE — Progress Notes (Signed)
BP 114/73   Pulse 94   Temp 98.6 F (37 C) (Oral)   Ht 5' 2.99" (1.6 m)   Wt 210 lb 9.6 oz (95.5 kg)   LMP  (LMP Unknown)   SpO2 98%   BMI 37.32 kg/m    Subjective:    [name], female    DOB: 1946/03/09, 76 y.o.   MRN: [id number]  HPI: Catherine Hinton is a 76 y.o. female  Chief Complaint  Patient presents with   Diabetes   Hypertension   Hyperlipidemia   Thyroid   DIABETES A1c in September 6.2%.  Continues on Metformin 1000 MG BID.  Taking Gabapentin & Duloxetine for neuropathy which offers benefit.  Saw neurologist on 05/15/22 for neuropathy -- may get nerve conduction study.   Hypoglycemic episodes:no Polydipsia/polyuria: no Visual disturbance: no Chest pain: no Paresthesias: no Glucose Monitoring: yes             Accucheck frequency: Daily             Fasting glucose: 110 range             Post prandial:             Evening:             Before meals: Taking Insulin?: no             Long acting insulin:             Short acting insulin: Blood Pressure Monitoring: daily Retinal Examination: Up to Date Foot Exam: Up to Date Pneumovax: Up to Date Influenza: Up to Date Aspirin: yes    HYPERTENSION / HYPERLIPIDEMIA Continues on Metoprolol, Valsartan-HCTZ, ASA and Crestor -- started last visit and is tolerating, occasional hand soreness.  Has CPAP and uses this nightly. Satisfied with current treatment? yes Duration of hypertension: chronic BP monitoring frequency: a few times a week BP range: 110/70 at home on average BP medication side effects: no Duration of hyperlipidemia: chronic Cholesterol medication side effects: no Cholesterol supplements: none Medication compliance: good compliance Aspirin: yes Recent stressors: no Recurrent headaches: no Visual changes: no Palpitations: no Dyspnea: no Chest pain: no Lower extremity edema: no Dizzy/lightheaded: no The ASCVD Risk score (Arnett DK, et al., 2019) failed to calculate for  the following reasons:   The valid total cholesterol range is 130 to 320 mg/dL  OSTEOPENIA Taking daily Vitamin D supplement. Last DEXA 02/03/20. Satisfied with current treatment?: yes Past osteoporosis medications/treatments:  supplements Adequate calcium & vitamin D: yes Weight bearing exercises: yes    HYPERTHYROIDISM She saw Dr. Ronnald Collum 3 weeks ago for follow-up.  Recent TSH with him noted level 0.035 -- continues on thyroid Armour 15 MG.  No more hot flashes present.  She reports overall is feeling better.  Has not had biopsy yet, did repeat ultrasound and the areas had reduced in size so no biopsy recommended. Fatigue: sometimes, but improving Cold intolerance: no Heat intolerance: yes Weight gain: no Weight loss: no Constipation: yes Diarrhea/loose stools: no Palpitations: no Lower extremity edema: no Anxiety/depressed mood:  occasional      06/06/2022    1:44 PM 04/24/2022    1:41 PM 04/19/2022   12:45 PM 03/26/2022   10:36 AM 02/21/2022    9:05 AM  Depression screen PHQ 2/9  Decreased Interest  _0 0  Down, Depressed, Hopeless 1 1 0 1 0  PHQ - 2 Score _1 0  Altered sleeping 1 2  _0 Tired, decreased energy _1 Change in appetite 1 1 0 1 1  Feeling bad or failure about yourself  0 0 0 0 0  Trouble concentrating 0 _2 0  Moving slowly or fidgety/restless 0 0 1 0 0  Suicidal thoughts 0 0 0 0 0  PHQ-9 Score _3 Difficult doing work/chores Not difficult at all Somewhat difficult Somewhat difficult Somewhat difficult Not difficult at all       06/06/2022    1:44 PM 04/24/2022    1:41 PM 03/26/2022   10:36 AM 02/21/2022    9:05 AM  GAD 7 : Generalized Anxiety Score  Nervous, Anxious, on Edge _4 Control/stop worrying 0 1 2 0  Worry too much - different things 0 1 2 0  Trouble relaxing 0 1 2 0  Restless 0 0 1 0  Easily annoyed or irritable 1 0 1 0  Afraid - awful might happen 0 1 2 0  Total GAD 7 Score _5 Anxiety Difficulty Not  difficult at all Somewhat difficult Somewhat difficult Not difficult at all   Relevant past medical, surgical, family and social history reviewed and updated as indicated. Interim medical history since our last visit reviewed. Allergies and medications reviewed and updated.  Review of Systems  Constitutional:  Negative for activity change, appetite change, diaphoresis, fatigue and fever.  Respiratory:  Negative for cough, chest tightness, shortness of breath and wheezing.   Cardiovascular:  Negative for chest pain, palpitations and leg swelling.  Gastrointestinal:  Positive for constipation.  Endocrine: Negative for cold intolerance, heat intolerance, polydipsia, polyphagia and polyuria.  Genitourinary: Negative.   Neurological: Negative.   Psychiatric/Behavioral: Negative.      Per HPI unless specifically indicated above     Objective:    BP 114/73   Pulse 94   Temp 98.6 F (37 C) (Oral)   Ht 5' 2.99" (1.6 m)   Wt 210 lb 9.6 oz (95.5 kg)   LMP  (LMP Unknown)   SpO2 98%   BMI 37.32 kg/m   Wt Readings from Last 3 Encounters:  06/06/22 210 lb 9.6 oz (95.5 kg)  04/24/22 209 lb 6.4 oz (95 kg)  03/26/22 211 lb 6.4 oz (95.9 kg)    Physical Exam Vitals and nursing note reviewed.  Constitutional:      General: She is awake. She is not in acute distress.    Appearance: She is well-developed. She is obese. She is not ill-appearing.  HENT:     Head: Normocephalic.     Right Ear: Hearing, tympanic membrane and ear canal normal. No drainage.     Left Ear: Hearing, tympanic membrane, ear canal and external ear normal. No drainage.     Nose: Nose normal.     Mouth/Throat:     Mouth: Mucous membranes are moist.     Pharynx: Oropharynx is clear.  Eyes:     General: Lids are normal.        Right eye: No discharge.        Left eye: No discharge.     Conjunctiva/sclera: Conjunctivae normal.     Pupils: Pupils are equal, round, and reactive to light.  Neck:     Vascular: No carotid  bruit.  Cardiovascular:     Rate and Rhythm: Normal rate and regular rhythm.     Heart sounds: Normal heart sounds. No murmur  heard.    No gallop.  Pulmonary:     Effort: Pulmonary effort is normal. No accessory muscle usage or respiratory distress.     Breath sounds: Normal breath sounds.  Abdominal:     General: Bowel sounds are normal.     Palpations: Abdomen is soft.  Musculoskeletal:     Cervical back: Normal range of motion and neck supple.     Right lower leg: No edema.     Left lower leg: No edema.  Lymphadenopathy:     Cervical: No cervical adenopathy.  Skin:    General: Skin is warm and dry.  Neurological:     Mental Status: She is alert and oriented to person, place, and time.  Psychiatric:        Attention and Perception: Attention normal.        Mood and Affect: Mood normal.        Behavior: Behavior normal. Behavior is cooperative.        Thought Content: Thought content normal.        Judgment: Judgment normal.     Results for orders placed or performed in visit on 02/21/22  Bayer DCA Hb A1c Waived  Result Value Ref Range   HB A1C (BAYER DCA - WAIVED) 6.2 (H) 4.8 - 5.6 %  CBC with Differential/Platelet  Result Value Ref Range   WBC 4.5 3.4 - 10.8 x10E3/uL   RBC 4.25 3.77 - 5.28 x10E6/uL   Hemoglobin 12.2 11.1 - 15.9 g/dL   Hematocrit 36.7 34.0 - 46.6 %   MCV 86 79 - 97 fL   MCH 28.7 26.6 - 33.0 pg   MCHC 33.2 31.5 - 35.7 g/dL   RDW 12.9 11.7 - 15.4 %   Platelets 264 150 - 450 x10E3/uL   Neutrophils 54 Not Estab. %   Lymphs 36 Not Estab. %   Monocytes 7 Not Estab. %   Eos 3 Not Estab. %   Basos 0 Not Estab. %   Neutrophils Absolute 2.4 1.4 - 7.0 x10E3/uL   Lymphocytes Absolute 1.6 0.7 - 3.1 x10E3/uL   Monocytes Absolute 0.3 0.1 - 0.9 x10E3/uL   EOS (ABSOLUTE) 0.1 0.0 - 0.4 x10E3/uL   Basophils Absolute 0.0 0.0 - 0.2 x10E3/uL   Immature Granulocytes 0 Not Estab. %   Immature Grans (Abs) 0.0 0.0 - 0.1 x10E3/uL  Comprehensive metabolic panel   Result Value Ref Range   Glucose 243 (H) 70 - 99 mg/dL   BUN 9 8 - 27 mg/dL   Creatinine, Ser 0.55 (L) 0.57 - 1.00 mg/dL   eGFR 96 >59 mL/min/1.73   BUN/Creatinine Ratio 16 12 - 28   Sodium 143 134 - 144 mmol/L   Potassium 3.9 3.5 - 5.2 mmol/L   Chloride 98 96 - 106 mmol/L   CO2 25 20 - 29 mmol/L   Calcium 9.8 8.7 - 10.3 mg/dL   Total Protein 6.7 6.0 - 8.5 g/dL   Albumin 4.5 3.8 - 4.8 g/dL   Globulin, Total 2.2 1.5 - 4.5 g/dL   Albumin/Globulin Ratio 2.0 1.2 - 2.2   Bilirubin Total 0.4 0.0 - 1.2 mg/dL   Alkaline Phosphatase 48 44 - 121 IU/L   AST 18 0 - 40 IU/L   ALT 13 0 - 32 IU/L  Lipid Panel w/o Chol/HDL Ratio  Result Value Ref Range   Cholesterol, Total 124 100 - 199 mg/dL   Triglycerides 149 0 - 149 mg/dL   HDL 34 (L) >39 mg/dL   VLDL  Cholesterol Cal 26 5 - 40 mg/dL   LDL Chol Calc (NIH) 64 0 - 99 mg/dL  TSH  Result Value Ref Range   TSH 0.052 (L) 0.450 - 4.500 uIU/mL  T4, free  Result Value Ref Range   Free T4 1.04 0.82 - 1.77 ng/dL  Uric acid  Result Value Ref Range   Uric Acid 5.7 3.1 - 7.9 mg/dL  VITAMIN D 25 Hydroxy (Vit-D Deficiency, Fractures)  Result Value Ref Range   Vit D, 25-Hydroxy 48.2 30.0 - 100.0 ng/mL  Vitamin B12  Result Value Ref Range   Vitamin B-12 648 232 - 1,245 pg/mL      Assessment & Plan:   Problem List Items Addressed This Visit       Cardiovascular and Mediastinum   Hypertension associated with diabetes (HCC)    Chronic, stable.  BP at goal.  Continue Valsartan-HCTZ 80 MG-12.5 MG daily and Metoprolol XL 50 MG daily due to tachycardia (thyroid related).  LABS: BMP. Could consider discontinuation of HCTZ at upcoming visits, as BP well controlled and history of gout.  DASH diet focus at home and monitor BPs, bring levels to visit.  Return in 3 months.      Relevant Orders   Bayer DCA Hb A1c Waived   Basic metabolic panel   Aortic atherosclerosis ([place])    Ongoing.  Noted on CT 09/18/19.  Continue to monitor and continue ASA + Lopid  for prevention.        Respiratory   Obstructive sleep apnea    Chronic, stable.  Continue nightly use, praised for this.         Endocrine   Hyperlipidemia associated with type 2 diabetes mellitus (HCC)    Chronic, stable.  Tolerating Crestor, will continue this and plan on recheck of lipid panel today.  Adjust dosing as needed.      Relevant Orders   Bayer DCA Hb A1c Waived   Lipid Panel w/o Chol/HDL Ratio   Hyperthyroidism    Ongoing, improving.  Continue collaboration with endocrinology.  At this time continue Metoprolol XL 50 MG daily due to mild tachycardia with her underlying hyperthyroid and continue Armour as prescribed by endo.  Return in 3 months.      Multiple thyroid nodules    Refer to hyperthyroid plan of care.      Type 2 diabetes mellitus with diabetic mononeuropathy, without long-term current use of insulin (HCC) - Primary    Chronic, ongoing.  A1c 6.7% today, staying stable, praised for this and urine ALB 30 today (February 2023).  Will continue Metformin XR 1000 MG BID and discussed with her may reduce in future.  Focus on diabetic diet at home and regular exercise.  Continue Gabapentin daily for neuropathy, continue 900 MG at HS and 300 MG in morning + 300 MG at evening time + Duloxetine 60 MG daily.  Current CrCl = 147 based on recent labs.   Recommend she monitor feet closely for any wounds or skin breakdown, alert provider immediately if present.  Continue collaboration with neurology.  Return in 3 months.      Relevant Orders   Bayer DCA Hb A1c Waived     Musculoskeletal and Integument   Osteopenia    Chronic, ongoing.  Noted on DEXA.  Continue Vitamin D supplement and check level next visit.  DEXA in 2026 next.        Other   Morbid obesity (HCC)    BMI 37.32.  Recommended eating smaller  high protein, low fat meals more frequently and exercising 30 mins a day 5 times a week with a goal of 10-15lb weight loss in the next 3 months. Patient voiced their  understanding and motivation to adhere to these recommendations.         Follow up plan: Return in about 3 months (around 09/05/2022) for T2DM, HTN/HLD, HYPERTHYROIDISM, OSTEOPENIA.

## 2022-06-06 NOTE — Assessment & Plan Note (Signed)
BMI 37.32.  Recommended eating smaller high protein, low fat meals more frequently and exercising 30 mins a day 5 times a week with a goal of 10-15lb weight loss in the next 3 months. Patient voiced their understanding and motivation to adhere to these recommendations.

## 2022-06-06 NOTE — Assessment & Plan Note (Signed)
Chronic, stable.  Continue nightly use, praised for this.

## 2022-06-06 NOTE — Assessment & Plan Note (Signed)
Ongoing.  Noted on CT 09/18/19.  Continue to monitor and continue ASA + Lopid for prevention.

## 2022-06-06 NOTE — Assessment & Plan Note (Signed)
Ongoing, improving.  Continue collaboration with endocrinology.  At this time continue Metoprolol XL 50 MG daily due to mild tachycardia with her underlying hyperthyroid and continue Armour as prescribed by endo.  Return in 3 months.

## 2022-06-07 LAB — BASIC METABOLIC PANEL
BUN/Creatinine Ratio: 13 (ref 12–28)
BUN: 8 mg/dL (ref 8–27)
CO2: 29 mmol/L (ref 20–29)
Calcium: 10 mg/dL (ref 8.7–10.3)
Chloride: 97 mmol/L (ref 96–106)
Creatinine, Ser: 0.6 mg/dL (ref 0.57–1.00)
Glucose: 138 mg/dL — ABNORMAL HIGH (ref 70–99)
Potassium: 4 mmol/L (ref 3.5–5.2)
Sodium: 141 mmol/L (ref 134–144)
eGFR: 94 mL/min/{1.73_m2} (ref >59)

## 2022-06-07 LAB — LIPID PANEL W/O CHOL/HDL RATIO
Cholesterol, Total: 65 mg/dL — ABNORMAL LOW (ref 100–199)
HDL: 40 mg/dL (ref >39)
LDL Chol Calc (NIH): 4 mg/dL (ref 0–99)
Triglycerides: 117 mg/dL (ref 0–149)
VLDL Cholesterol Cal: 21 mg/dL (ref 5–40)

## 2022-06-07 NOTE — Progress Notes (Signed)
Contacted via [place] morning [place]. Your labs have returned: - Kidney function, creatinine and eGFR, remains normal. - Cholesterol labs are way below goal -- amazing labs.  Any questions? Keep being amazing!!  Thank you for allowing me to participate in your care.  I appreciate you. Kindest regards, Kalsey Lull

## 2022-06-27 DIAGNOSIS — R202 Paresthesia of skin: Secondary | ICD-10-CM | POA: Diagnosis not present

## 2022-06-27 DIAGNOSIS — R200 Anesthesia of skin: Secondary | ICD-10-CM | POA: Diagnosis not present

## 2022-07-24 ENCOUNTER — Telehealth: Payer: Self-pay

## 2022-07-24 NOTE — Progress Notes (Unsigned)
Care Management & Coordination Services Pharmacy Team  Reason for Encounter: Diabetes  Contacted patient to discuss diabetes disease state. {US HC Outreach:28874}  Current antihyperglycemic regimen:  ***   Patient verbally confirms she is taking the above medications as directed. {yes/no:20286}  What diet changes have been made to improve diabetes control?  What recent interventions/DTPs have been made to improve glycemic control:  ***  Have there been any recent hospitalizations or ED visits since last visit with PharmD? No  Patient {reports/denies:24182} hypoglycemic symptoms, including {Hypoglycemic Symptoms:3049003}  Patient {reports/denies:24182} hyperglycemic symptoms, including {symptoms; hyperglycemia:17903}  How often are you checking your blood sugar? {BG Testing frequency:23922}  What are your blood sugars ranging?  Fasting: *** Before meals: *** After meals: *** Bedtime: ***  During the week, how often does your blood glucose drop below 70? {LowBGfrequency:24142}  Are you checking your feet daily/regularly? {yes/no:20286}  Adherence Review: Is the patient currently on a STATIN medication? Yes Is the patient currently on ACE/ARB medication? Yes Does the patient have >5 day gap between last estimated fill dates? No   Chart Updates:  Recent office visits:  06/06/22 Marnee Guarneri T, NP (Type 2 diabetes) Orders placed: Labs; Medication changes: none  Recent consult visits:  05/15/22 Rufina Falco, [facility] (Numbness and tingling) Orders placed: none; Medication changes: none  Hospital visits:  None in previous 6 months  Medications: Outpatient Encounter Medications as of 07/24/2022  Medication Sig   allopurinol (ZYLOPRIM) 300 MG tablet Take 1 tablet (300 mg total) by mouth daily.   aspirin 81 MG EC tablet Take 81 mg by mouth daily.    Blood Glucose Monitoring Suppl (TRUE METRIX AIR GLUCOSE METER) w/Device KIT Use to check blood sugar  2-3 times a day and document.  Bring log to visits.   Cholecalciferol 25 MCG (1000 UT) tablet Take 2,000 Units by mouth daily.    DHA-EPA-Flaxseed Oil-Vitamin E (THERA TEARS NUTRITION PO) Take 1 mg by mouth 2 (two) times daily.    DULoxetine (CYMBALTA) 30 MG capsule Start out with one capsule (30 MG) once daily by mouth for one week and if tolerating at week two may increase to 2 tablets (60 MG) by mouth daily.   gabapentin (NEURONTIN) 300 MG capsule TAKE 1 CAPSULE EVERY MORNING, AND THEN 1 CAPSULE AT 6 PM, AND THEN TAKE 3 CAPSULES EVERY NIGHT BEFORE BEDTIME.   glucose blood (TRUE METRIX BLOOD GLUCOSE TEST) test strip Use to check blood sugar 2-3 times a day and document.  Bring log to visits.   metFORMIN (GLUCOPHAGE-XR) 500 MG 24 hr tablet TAKE 2 TABLETS EVERY MORNING  AND TAKE 2 TABLETS AT BEDTIME   metoprolol succinate (TOPROL-XL) 50 MG 24 hr tablet Take 1 tablet (50 mg total) by mouth daily. Take with or immediately following a meal.   MIEBO 1.338 GM/ML SOLN Apply to eye.   Multiple Vitamins-Minerals (EYE VITAMINS PO) Take 1 tablet by mouth daily.   rosuvastatin (CRESTOR) 20 MG tablet Take 1 tablet (20 mg total) by mouth daily.   thyroid (ARMOUR) 30 MG tablet Take 15 mg by mouth daily before breakfast.   TRUEplus Lancets 33G MISC Use to check blood sugar 2 to 3 times a day and document. Bring log to visits.   valsartan-hydrochlorothiazide (DIOVAN-HCT) 80-12.5 MG tablet Take 1 tablet by mouth daily.   vitamin B-12 (CYANOCOBALAMIN) 1000 MCG tablet Take 1,000 mcg by mouth daily.   XIIDRA 5 % SOLN Place 1 drop into both eyes 2 (two) times daily.  No facility-administered encounter medications on file as of 07/24/2022.    Recent Relevant Labs: Lab Results  Component Value Date/Time   HGBA1C 6.7 (H) 06/06/2022 01:43 PM   HGBA1C 6.2 (H) 02/21/2022 09:42 AM   HGBA1C 6.8 11/06/2017 12:00 AM   MICROALBUR 30 (H) 08/15/2021 09:32 AM   MICROALBUR 10 07/12/2020 09:45 AM    Kidney Function Lab  Results  Component Value Date/Time   CREATININE 0.60 06/06/2022 01:45 PM   CREATININE 0.55 (L) 02/21/2022 09:44 AM   GFRNONAA 91 07/12/2020 09:47 AM   GFRAA 105 07/12/2020 09:47 AM    Star Rating Drugs: Yes Medication:  Last Fill: Day Supply Metformin 500 mg 06/14/22, 03/17/22 90 ds Rosuvastatin 20 mg 05/10/22, 02/21/22 90 ds Valsartan-HCTZ 80-12.5 mg 07/09/22, 04/30/22 90 ds  Care Gaps: Annual wellness visit in last year? Yes, 04/19/22 (Medicare annual wellness exam) Last eye exam / retinopathy screening:08/29/21 Last diabetic foot exam:02/21/22   Catherine Hinton

## 2022-07-27 ENCOUNTER — Other Ambulatory Visit: Payer: Self-pay | Admitting: Nurse Practitioner

## 2022-07-27 NOTE — Telephone Encounter (Signed)
Requested Prescriptions  Pending Prescriptions Disp Refills   glucose blood (TRUE METRIX BLOOD GLUCOSE TEST) test strip [Pharmacy Med Name: TRUE METRIX SELF MONITORING BLOOD GLUCOSE STRIPS   Strip] 100 strip 3    Sig: TEST BLOOD SUGAR 2 TO 3 TIMES DAILY AND DOCUMENT. BRING LOG TO VISITS.     Endocrinology: Diabetes - Testing Supplies Passed - 07/27/2022  5:20 PM      Passed - Valid encounter within last 12 months    Recent Outpatient Visits           1 month ago Type 2 diabetes mellitus with diabetic mononeuropathy, without long-term current use of insulin ([place])   [place], [place] T, NP   3 months ago Hyperthyroidism   [place], [place] T, NP   4 months ago Hypertension associated with diabetes ([place])   [place], [place] T, NP   5 months ago Type 2 diabetes mellitus with diabetic mononeuropathy, without long-term current use of insulin ([place])   [place], [place] T, NP   10 months ago Type 2 diabetes mellitus with diabetic mononeuropathy, without long-term current use of insulin ([place])   [place], Barbaraann Faster, NP       Future Appointments             In 1 month [place], Barbaraann Faster, NP [place]

## 2022-09-02 NOTE — Patient Instructions (Signed)
Be Involved in [place]:  Taking Medications When medications are taken as directed, they can greatly improve your health. But if they are not taken as instructed, they may not work. In some cases, not taking them correctly can be harmful. To help ensure your treatment remains effective and safe, understand your medications and how to take them.  Your lab results, notes and after visit summary will be available on My Chart. We strongly encourage you to use this feature. If lab results are abnormal the clinic will contact you with the appropriate steps. If the clinic does not contact you assume the results are satisfactory. You can always see your results on My Chart. If you have questions regarding your condition, please contact the clinic during office hours. You can also ask questions on My Chart.  We at [facility] are grateful that you chose [place] to provide care. We strive to provide excellent and compassionate care and are always looking for feedback. If you get a survey from the clinic please complete this.    Peripheral Neuropathy Peripheral neuropathy is a type of nerve damage. It affects nerves that carry signals between the spinal cord and the arms, legs, and the rest of the body (peripheral nerves). It does not affect nerves in the spinal cord or brain. In peripheral neuropathy, one nerve or a group of nerves may be damaged. Peripheral neuropathy is a broad category that includes many specific nerve disorders, like diabetic neuropathy, hereditary neuropathy, and carpal tunnel syndrome. What are the causes? This condition may be caused by: Certain diseases, such as: Diabetes. This is the most common cause of peripheral neuropathy. Autoimmune diseases, such as rheumatoid arthritis and systemic lupus erythematosus. Nerve diseases that are passed from parent to child (inherited). Kidney disease. Thyroid disease. Other causes may include: Nerve injury. Pressure or stress  on a nerve that lasts a long time. Lack (deficiency) of B vitamins. This can result from alcoholism, poor diet, or a restricted diet. Infections. Some medicines, such as cancer medicines (chemotherapy). Poisonous (toxic) substances, such as lead and mercury. Too little blood flowing to the legs. In some cases, the cause of this condition is not known. What are the signs or symptoms? Symptoms of this condition depend on which of your nerves is damaged. Symptoms in the legs, hands, and arms can include: Loss of feeling (numbness) in the feet, hands, or both. Tingling in the feet, hands, or both. Burning pain. Very sensitive skin. Weakness. Not being able to move a part of the body (paralysis). Clumsiness or poor coordination. Muscle twitching. Loss of balance. Symptoms in other parts of the body can include: Not being able to control your bladder. Feeling dizzy. Sexual problems. How is this diagnosed? Diagnosing and finding the cause of peripheral neuropathy can be difficult. Your health care provider will take your medical history and do a physical exam. A neurological exam will also be done. This involves checking things that are affected by your brain, spinal cord, and nerves (nervous system). For example, your health care provider will check your reflexes, how you move, and what you can feel. You may have other tests, such as: Blood tests. Electromyogram (EMG) and nerve conduction tests. These tests check nerve function and how well the nerves are controlling the muscles. Imaging tests, such as a CT scan or MRI, to rule out other causes of your symptoms. Removing a small piece of nerve to be examined in a lab (nerve biopsy). Removing and examining a  small amount of the fluid that surrounds the brain and spinal cord (lumbar puncture). How is this treated? Treatment for this condition may involve: Treating the underlying cause of the neuropathy, such as diabetes, kidney disease, or  vitamin deficiencies. Stopping medicines that can cause neuropathy, such as chemotherapy. Medicine to help relieve pain. Medicines may include: Prescription or over-the-counter pain medicine. Anti-seizure medicine. Antidepressants. Pain-relieving patches that are applied to painful areas of skin. Surgery to relieve pressure on a nerve or to destroy a nerve that is causing pain. Physical therapy to help improve movement and balance. Devices to help you move around (assistive devices). Follow these instructions at home: Medicines Take over-the-counter and prescription medicines only as told by your health care provider. Do not take any other medicines without first asking your health care provider. Ask your health care provider if the medicine prescribed to you requires you to avoid driving or using machinery. Lifestyle  Do not use any products that contain nicotine or tobacco. These products include cigarettes, chewing tobacco, and vaping devices, such as e-cigarettes. Smoking keeps blood from reaching damaged nerves. If you need help quitting, ask your health care provider. Avoid or limit alcohol. Too much alcohol can cause a vitamin B deficiency, and vitamin B is needed for healthy nerves. Eat a healthy diet. This includes: Eating foods that are high in fiber, such as beans, whole grains, and fresh fruits and vegetables. Limiting foods that are high in fat and processed sugars, such as fried or sweet foods. General instructions  If you have diabetes, work closely with your health care provider to keep your blood sugar under control. If you have numbness in your feet: Check every day for signs of injury or infection. Watch for redness, warmth, and swelling. Wear padded socks and comfortable shoes. These help protect your feet. Develop a good support system. Living with peripheral neuropathy can be stressful. Consider talking with a mental health specialist or joining a support group. Use  assistive devices and attend physical therapy as told by your health care provider. This may include using a walker or a cane. Keep all follow-up visits. This is important. Where to find more information [employer] of Neurological Disorders: [employer] Contact a health care provider if: You have new signs or symptoms of peripheral neuropathy. You are struggling emotionally from dealing with peripheral neuropathy. Your pain is not well controlled. Get help right away if: You have an injury or infection that is not healing normally. You develop new weakness in an arm or leg. You have fallen or do so frequently. Summary Peripheral neuropathy is when the nerves in the arms or legs are damaged, resulting in numbness, weakness, or pain. There are many causes of peripheral neuropathy, including diabetes, pinched nerves, vitamin deficiencies, autoimmune disease, and hereditary conditions. Diagnosing and finding the cause of peripheral neuropathy can be difficult. Your health care provider will take your medical history, do a physical exam, and do tests, including blood tests and nerve function tests. Treatment involves treating the underlying cause of the neuropathy and taking medicines to help control pain. Physical therapy and assistive devices may also help. This information is not intended to replace advice given to you by your health care provider. Make sure you discuss any questions you have with your health care provider. Document Revised: 02/07/2021 Document Reviewed: 02/07/2021 Elsevier Patient Education  [place].

## 2022-09-07 ENCOUNTER — Ambulatory Visit (INDEPENDENT_AMBULATORY_CARE_PROVIDER_SITE_OTHER): Payer: Medicare HMO | Admitting: Nurse Practitioner

## 2022-09-07 ENCOUNTER — Encounter: Payer: Self-pay | Admitting: Nurse Practitioner

## 2022-09-07 VITALS — BP 130/82 | HR 96 | Temp 98.7°F | Ht 62.99 in | Wt 212.4 lb

## 2022-09-07 DIAGNOSIS — I700 Atherosclerosis of aorta: Secondary | ICD-10-CM

## 2022-09-07 DIAGNOSIS — E1142 Type 2 diabetes mellitus with diabetic polyneuropathy: Secondary | ICD-10-CM | POA: Diagnosis not present

## 2022-09-07 DIAGNOSIS — E1169 Type 2 diabetes mellitus with other specified complication: Secondary | ICD-10-CM | POA: Diagnosis not present

## 2022-09-07 DIAGNOSIS — E559 Vitamin D deficiency, unspecified: Secondary | ICD-10-CM

## 2022-09-07 DIAGNOSIS — E042 Nontoxic multinodular goiter: Secondary | ICD-10-CM

## 2022-09-07 DIAGNOSIS — E538 Deficiency of other specified B group vitamins: Secondary | ICD-10-CM

## 2022-09-07 DIAGNOSIS — E0590 Thyrotoxicosis, unspecified without thyrotoxic crisis or storm: Secondary | ICD-10-CM

## 2022-09-07 DIAGNOSIS — E1159 Type 2 diabetes mellitus with other circulatory complications: Secondary | ICD-10-CM | POA: Diagnosis not present

## 2022-09-07 DIAGNOSIS — M85851 Other specified disorders of bone density and structure, right thigh: Secondary | ICD-10-CM

## 2022-09-07 DIAGNOSIS — E785 Hyperlipidemia, unspecified: Secondary | ICD-10-CM

## 2022-09-07 DIAGNOSIS — G4733 Obstructive sleep apnea (adult) (pediatric): Secondary | ICD-10-CM

## 2022-09-07 DIAGNOSIS — I152 Hypertension secondary to endocrine disorders: Secondary | ICD-10-CM

## 2022-09-07 LAB — BAYER DCA HB A1C WAIVED: HB A1C (BAYER DCA - WAIVED): 7.1 % — ABNORMAL HIGH (ref 4.8–5.6)

## 2022-09-07 LAB — MICROALBUMIN, URINE WAIVED
Creatinine, Urine Waived: 10 mg/dL (ref 10–300)
Microalb, Ur Waived: 10 mg/L (ref 0–19)

## 2022-09-07 MED ORDER — GABAPENTIN 300 MG PO CAPS: ORAL_CAPSULE | ORAL | 4 refills | Status: DC

## 2022-09-07 NOTE — Assessment & Plan Note (Signed)
Ongoing.  Noted on CT 09/18/19.  Continue to monitor and continue ASA + Lopid for prevention. 

## 2022-09-07 NOTE — Assessment & Plan Note (Addendum)
Chronic, ongoing.  A1c 7.1% today, slight trend up due to diet. Urine ALB 10 (March 2024).  Will continue Metformin XR 1000 MG BID and discussed with her may reduce in future.  Focus on diabetic diet at home and regular exercise.  Continue Gabapentin daily for neuropathy, increase to 900 MG at HS and 600 MG in morning + 600 MG at evening time + Duloxetine 60 MG daily.  Current CrCl = 147 based on recent labs.   Recommend she monitor feet closely for any wounds or skin breakdown, alert provider immediately if present.  Continue collaboration with neurology.  Return in 3 months.  May need to consider pain management or Lyrica. - Check Multiple Myeloma and iron labs per neurology

## 2022-09-07 NOTE — Assessment & Plan Note (Signed)
Chronic, stable.  Tolerating Crestor, will continue this and plan on recheck of lipid panel today.  Adjust dosing as needed. 

## 2022-09-07 NOTE — Assessment & Plan Note (Signed)
Chronic, stable.  Continue daily supplement and check Vit D level today.  DEXA in 2026 next 

## 2022-09-07 NOTE — Assessment & Plan Note (Signed)
Ongoing.  Continue collaboration with endocrinology.  At this time continue Metoprolol XL 50 MG daily due to mild tachycardia with her underlying hyperthyroid and continue Armour as prescribed by endo.  Return in 3 months.

## 2022-09-07 NOTE — Assessment & Plan Note (Signed)
Chronic, stable.  Continue nightly use, praised for this.  Will place referral for sleep study as needs new equipment.

## 2022-09-07 NOTE — Assessment & Plan Note (Signed)
BMI 37.63.  Recommended eating smaller high protein, low fat meals more frequently and exercising 30 mins a day 5 times a week with a goal of 10-15lb weight loss in the next 3 months. Patient voiced their understanding and motivation to adhere to these recommendations.

## 2022-09-07 NOTE — Assessment & Plan Note (Signed)
Ongoing.  Continue daily supplement and recheck level today.  She is on chronic Metformin, which can cause lower B12 levels.  Educated her on this.  May consider monthly injections for a bit if level not improved with oral supplement. 

## 2022-09-07 NOTE — Assessment & Plan Note (Addendum)
Chronic, stable.  BP at goal for age today.  Continue Valsartan-HCTZ 80 MG-12.5 MG daily and Metoprolol XL 50 MG daily due to tachycardia (thyroid related).  LABS: CMP and urine ALB.  Urine ALB 26 August 2022. Could consider discontinuation of HCTZ at upcoming visits, as BP well controlled and history of gout.  DASH diet focus at home and monitor BPs, bring levels to visit.  Return in 3 months.

## 2022-09-07 NOTE — Progress Notes (Signed)
BP 130/82   Pulse 96   Temp 98.7 F (37.1 C) (Oral)   Ht 5' 2.99" (1.6 m)   Wt 212 lb 6.4 oz (96.3 kg)   LMP  (LMP Unknown)   SpO2 98%   BMI 37.63 kg/m    Subjective:    [name], female    DOB: 01-18-1946, 77 y.o.   MRN: [id number]  HPI: Catherine Hinton is a 77 y.o. female  Chief Complaint  Patient presents with   Diabetes   Hyperlipidemia   Hyperthyroidism   Osteopenia   DIABETES A1c 6.7% December.  Continues on Metformin 1000 MG BID.  Continues Gabapentin & Duloxetine for neuropathy which offers benefit.  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  Noticing more discomfort in the afternoon recently, mornings are not too bad. Hypoglycemic episodes:no Polydipsia/polyuria: no Visual disturbance: no Chest pain: no Paresthesias: no Glucose Monitoring: yes             Accucheck frequency: Daily             Fasting glucose: 120 range             Post prandial:             Evening:             Before meals: Taking Insulin?: no             Long acting insulin:             Short acting insulin: Blood Pressure Monitoring: daily Retinal Examination: Up to Date -- Dr. Hall Busing Exam: Up to Date Pneumovax: Up to Date Influenza: Up to Date Aspirin: yes    HYPERTENSION / HYPERLIPIDEMIA Continues on Metoprolol, Valsartan-HCTZ, ASA and Crestor.  Has CPAP and uses this nightly -- she needs some assistance with getting supplies. Satisfied with current treatment? yes Duration of hypertension: chronic BP monitoring frequency: a few times a week BP range: 120/70 at home on average BP medication side effects: no Duration of hyperlipidemia: chronic Cholesterol medication side effects: no Cholesterol supplements: none Medication compliance: good compliance Aspirin: yes Recent stressors: no Recurrent headaches: no Visual changes:  no Palpitations: no Dyspnea: no Chest pain: no Lower extremity edema: no Dizzy/lightheaded: no The ASCVD Risk score (Arnett DK, et al., 2019) failed to calculate for the following reasons:   The valid total cholesterol range is 130 to 320 mg/dL  OSTEOPENIA Taking daily Vitamin D supplement. Last DEXA 02/03/20. Satisfied with current treatment?: yes Past osteoporosis medications/treatments:  supplements Adequate calcium & vitamin D: yes Weight bearing exercises: yes    HYPERTHYROIDISM She saw Dr. Ronnald Collum  two months ago, has another ultrasound scheduled for June and labs.  Recent TSH with him noted level 0.035 -- continues on thyroid Armour 15 MG.  Reports overall is feeling better.  Continues on Metoprolol for HR control. Fatigue: sometimes, but improving Cold intolerance: no Heat intolerance: yes Weight gain: no Weight loss: no Constipation: no Diarrhea/loose stools: no Palpitations: no Lower extremity edema: no Anxiety/depressed mood:  occasional      09/07/2022    1:51 PM 06/06/2022    1:44 PM 04/24/2022    1:41 PM 04/19/2022   12:45 PM 03/26/2022   10:36 AM  Depression screen PHQ 2/9  Decreased Interest 0  1 2 1   Down, Depressed, Hopeless 0 1 1 0 1  PHQ - 2 Score 0 1  2 2 2   Altered sleeping 1 1 2 3 1   Tired, decreased energy 1 1 3 3 3   Change in appetite 1 1 1  0 1  Feeling bad or failure about yourself  0 0 0 0 0  Trouble concentrating 0 0 1 1 1   Moving slowly or fidgety/restless 0 0 0 1 0  Suicidal thoughts 0 0 0 0 0  PHQ-9 Score 3 4 9 10 8   Difficult doing work/chores Somewhat difficult Not difficult at all Somewhat difficult Somewhat difficult Somewhat difficult       09/07/2022    1:52 PM 06/06/2022    1:44 PM 04/24/2022    1:41 PM 03/26/2022   10:36 AM  GAD 7 : Generalized Anxiety Score  Nervous, Anxious, on Edge 1 1 1 3   Control/stop worrying 0 0 1 2  Worry too much - different things 0 0 1 2  Trouble relaxing 0 0 1 2  Restless 0 0 0 1  Easily annoyed  or irritable 0 1 0 1  Afraid - awful might happen 0 0 1 2  Total GAD 7 Score 1 2 5 13   Anxiety Difficulty Not difficult at all Not difficult at all Somewhat difficult Somewhat difficult   Relevant past medical, surgical, family and social history reviewed and updated as indicated. Interim medical history since our last visit reviewed. Allergies and medications reviewed and updated.  Review of Systems  Constitutional:  Negative for activity change, appetite change, diaphoresis, fatigue and fever.  Respiratory:  Negative for cough, chest tightness, shortness of breath and wheezing.   Cardiovascular:  Negative for chest pain, palpitations and leg swelling.  Gastrointestinal: Negative.   Endocrine: Negative for cold intolerance, heat intolerance, polydipsia, polyphagia and polyuria.  Genitourinary: Negative.   Neurological: Negative.   Psychiatric/Behavioral: Negative.      Per HPI unless specifically indicated above     Objective:    BP 130/82   Pulse 96   Temp 98.7 F (37.1 C) (Oral)   Ht 5' 2.99" (1.6 m)   Wt 212 lb 6.4 oz (96.3 kg)   LMP  (LMP Unknown)   SpO2 98%   BMI 37.63 kg/m   Wt Readings from Last 3 Encounters:  09/07/22 212 lb 6.4 oz (96.3 kg)  06/06/22 210 lb 9.6 oz (95.5 kg)  04/24/22 209 lb 6.4 oz (95 kg)    Physical Exam Vitals and nursing note reviewed.  Constitutional:      General: She is awake. She is not in acute distress.    Appearance: She is well-developed. She is obese. She is not ill-appearing.  HENT:     Head: Normocephalic.     Right Ear: Hearing, tympanic membrane and ear canal normal. No drainage.     Left Ear: Hearing, tympanic membrane, ear canal and external ear normal. No drainage.     Nose: Nose normal.     Mouth/Throat:     Mouth: Mucous membranes are moist.     Pharynx: Oropharynx is clear.  Eyes:     General: Lids are normal.        Right eye: No discharge.        Left eye: No discharge.     Conjunctiva/sclera: Conjunctivae  normal.     Pupils: Pupils are equal, round, and reactive to light.  Neck:     Vascular: No carotid bruit.  Cardiovascular:     Rate and Rhythm: Normal rate and regular rhythm.     Heart sounds: Normal  heart sounds. No murmur heard.    No gallop.  Pulmonary:     Effort: Pulmonary effort is normal. No accessory muscle usage or respiratory distress.     Breath sounds: Normal breath sounds.  Abdominal:     General: Bowel sounds are normal.     Palpations: Abdomen is soft.  Musculoskeletal:     Cervical back: Normal range of motion and neck supple.     Right lower leg: No edema.     Left lower leg: No edema.  Lymphadenopathy:     Cervical: No cervical adenopathy.  Skin:    General: Skin is warm and dry.  Neurological:     Mental Status: She is alert and oriented to person, place, and time.  Psychiatric:        Attention and Perception: Attention normal.        Mood and Affect: Mood normal.        Behavior: Behavior normal. Behavior is cooperative.        Thought Content: Thought content normal.        Judgment: Judgment normal.    Results for orders placed or performed in visit on 06/06/22  Bayer DCA Hb A1c Waived  Result Value Ref Range   HB A1C (BAYER DCA - WAIVED) 6.7 (H) 4.8 - 5.6 %  Basic metabolic panel  Result Value Ref Range   Glucose 138 (H) 70 - 99 mg/dL   BUN 8 8 - 27 mg/dL   Creatinine, Ser 0.60 0.57 - 1.00 mg/dL   eGFR 94 >59 mL/min/1.73   BUN/Creatinine Ratio 13 12 - 28   Sodium 141 134 - 144 mmol/L   Potassium 4.0 3.5 - 5.2 mmol/L   Chloride 97 96 - 106 mmol/L   CO2 29 20 - 29 mmol/L   Calcium 10.0 8.7 - 10.3 mg/dL  Lipid Panel w/o Chol/HDL Ratio  Result Value Ref Range   Cholesterol, Total 65 (L) 100 - 199 mg/dL   Triglycerides 117 0 - 149 mg/dL   HDL 40 >39 mg/dL   VLDL Cholesterol Cal 21 5 - 40 mg/dL   LDL Chol Calc (NIH) 4 0 - 99 mg/dL      Assessment & Plan:   Problem List Items Addressed This Visit       Cardiovascular and Mediastinum    Hypertension associated with diabetes (HCC)    Chronic, stable.  BP at goal for age today.  Continue Valsartan-HCTZ 80 MG-12.5 MG daily and Metoprolol XL 50 MG daily due to tachycardia (thyroid related).  LABS: CMP and urine ALB.  Urine ALB 26 August 2022. Could consider discontinuation of HCTZ at upcoming visits, as BP well controlled and history of gout.  DASH diet focus at home and monitor BPs, bring levels to visit.  Return in 3 months.      Relevant Orders   Bayer DCA Hb A1c Waived   Microalbumin, Urine Waived   Comprehensive metabolic panel   Aortic atherosclerosis (HCC)    Ongoing.  Noted on CT 09/18/19.  Continue to monitor and continue ASA + Lopid for prevention.        Respiratory   Obstructive sleep apnea    Chronic, stable.  Continue nightly use, praised for this.  Will place referral for sleep study as needs new equipment.      Relevant Orders   Ambulatory referral to Sleep Studies     Endocrine   Hyperlipidemia associated with type 2 diabetes mellitus (HCC)    Chronic, stable.  Tolerating Crestor, will continue this and plan on recheck of lipid panel today.  Adjust dosing as needed.      Relevant Orders   Bayer DCA Hb A1c Waived   Comprehensive metabolic panel   Lipid Panel w/o Chol/HDL Ratio   Hyperthyroidism    Ongoing.  Continue collaboration with endocrinology.  At this time continue Metoprolol XL 50 MG daily due to mild tachycardia with her underlying hyperthyroid and continue Armour as prescribed by endo.  Return in 3 months.      Multiple thyroid nodules    Refer to hyperthyroid plan of care.      Type 2 diabetes mellitus with diabetic polyneuropathy, without long-term current use of insulin (HCC) - Primary    Chronic, ongoing.  A1c 7.1% today, slight trend up due to diet. Urine ALB 10 (March 2024).  Will continue Metformin XR 1000 MG BID and discussed with her may reduce in future.  Focus on diabetic diet at home and regular exercise.  Continue Gabapentin  daily for neuropathy, increase to 900 MG at HS and 600 MG in morning + 600 MG at evening time + Duloxetine 60 MG daily.  Current CrCl = 147 based on recent labs.   Recommend she monitor feet closely for any wounds or skin breakdown, alert provider immediately if present.  Continue collaboration with neurology.  Return in 3 months.  May need to consider pain management or Lyrica. - Check Multiple Myeloma and iron labs per neurology      Relevant Medications   gabapentin (NEURONTIN) 300 MG capsule   Other Relevant Orders   Folate   Multiple Myeloma Panel (SPEP&IFE w/QIG)   Bayer DCA Hb A1c Waived   Microalbumin, Urine Waived   Iron Binding Cap (TIBC)([facility])   Ferritin     Musculoskeletal and Integument   Osteopenia    Chronic, ongoing.  Noted on DEXA.  Continue Vitamin D supplement and check level next visit.  DEXA in 2026 next.        Other   B12 deficiency    Ongoing.  Continue daily supplement and recheck level today.  She is on chronic Metformin, which can cause lower B12 levels.  Educated her on this.  May consider monthly injections for a bit if level not improved with oral supplement.      Relevant Orders   Vitamin B12   Morbid obesity ([place])    BMI 37.63.  Recommended eating smaller high protein, low fat meals more frequently and exercising 30 mins a day 5 times a week with a goal of 10-15lb weight loss in the next 3 months. Patient voiced their understanding and motivation to adhere to these recommendations.       Vitamin D deficiency    Chronic, stable.  Continue daily supplement and check Vit D level today.  DEXA in 2026 next.        Follow up plan: Return in about 3 months (around 12/08/2022) for T2DM, HTN/HLD, HYPERTHYROIDISM.

## 2022-09-07 NOTE — Assessment & Plan Note (Signed)
Refer to hyperthyroid plan of care. 

## 2022-09-07 NOTE — Assessment & Plan Note (Signed)
Chronic, ongoing.  Noted on DEXA.  Continue Vitamin D supplement and check level next visit.  DEXA in 2026 next. 

## 2022-09-08 ENCOUNTER — Encounter: Payer: Self-pay | Admitting: Nurse Practitioner

## 2022-09-08 NOTE — Progress Notes (Signed)
Contacted via [place] afternoon [place], your labs are starting to return: - Kidney function, creatinine and eGFR, remains normal.  Liver function shows mild elevation in AST and ALT, this is very mild and we will continue to monitor.  I recommend reduction on any Tylenol use and monitor diet, healthier diet choices. - Cholesterol labs remain at goal and look great. - I check iron and ferritin + another lab per neurology recommendation with your neuropathy.  Your iron level is a little on lower side of normal.  Definitely recommend getting more iron rich foods in diet, like deep leafy greens.  Any questions? Keep being amazing!!  Thank you for allowing me to participate in your care.  I appreciate you. Kindest regards, Ridgely Anastacio

## 2022-09-14 LAB — COMPREHENSIVE METABOLIC PANEL
ALT: 42 IU/L — ABNORMAL HIGH (ref 0–32)
AST: 42 IU/L — ABNORMAL HIGH (ref 0–40)
Albumin/Globulin Ratio: 2 (ref 1.2–2.2)
Albumin: 4.5 g/dL (ref 3.8–4.8)
Alkaline Phosphatase: 58 IU/L (ref 44–121)
BUN/Creatinine Ratio: 16 (ref 12–28)
BUN: 9 mg/dL (ref 8–27)
Bilirubin Total: 0.4 mg/dL (ref 0.0–1.2)
CO2: 25 mmol/L (ref 20–29)
Calcium: 9.7 mg/dL (ref 8.7–10.3)
Chloride: 96 mmol/L (ref 96–106)
Creatinine, Ser: 0.55 mg/dL — ABNORMAL LOW (ref 0.57–1.00)
Globulin, Total: 2.2 g/dL (ref 1.5–4.5)
Glucose: 127 mg/dL — ABNORMAL HIGH (ref 70–99)
Potassium: 4 mmol/L (ref 3.5–5.2)
Sodium: 140 mmol/L (ref 134–144)
Total Protein: 6.7 g/dL (ref 6.0–8.5)
eGFR: 96 mL/min/{1.73_m2} (ref >59)

## 2022-09-14 LAB — MULTIPLE MYELOMA PANEL, SERUM
Albumin SerPl Elph-Mcnc: 4 g/dL (ref 2.9–4.4)
Albumin/Glob SerPl: 1.5 (ref 0.7–1.7)
Alpha 1: 0.2 g/dL (ref 0.0–0.4)
Alpha2 Glob SerPl Elph-Mcnc: 0.8 g/dL (ref 0.4–1.0)
B-Globulin SerPl Elph-Mcnc: 1 g/dL (ref 0.7–1.3)
Gamma Glob SerPl Elph-Mcnc: 0.7 g/dL (ref 0.4–1.8)
Globulin, Total: 2.7 g/dL (ref 2.2–3.9)
IgA/Immunoglobulin A, Serum: 92 mg/dL (ref 64–422)
IgG (Immunoglobin G), Serum: 692 mg/dL (ref 586–1602)
IgM (Immunoglobulin M), Srm: 67 mg/dL (ref 26–217)

## 2022-09-14 LAB — IRON AND TIBC
Iron Saturation: 13 % — ABNORMAL LOW (ref 15–55)
Iron: 54 ug/dL (ref 27–139)
Total Iron Binding Capacity: 430 ug/dL (ref 250–450)
UIBC: 376 ug/dL — ABNORMAL HIGH (ref 118–369)

## 2022-09-14 LAB — VITAMIN B12: Vitamin B-12: 694 pg/mL (ref 232–1245)

## 2022-09-14 LAB — FERRITIN: Ferritin: 33 ng/mL (ref 15–150)

## 2022-09-14 LAB — LIPID PANEL W/O CHOL/HDL RATIO
Cholesterol, Total: 81 mg/dL — ABNORMAL LOW (ref 100–199)
HDL: 38 mg/dL — ABNORMAL LOW (ref >39)
LDL Chol Calc (NIH): 11 mg/dL (ref 0–99)
Triglycerides: 207 mg/dL — ABNORMAL HIGH (ref 0–149)
VLDL Cholesterol Cal: 32 mg/dL (ref 5–40)

## 2022-09-14 LAB — FOLATE: Folate: >20 ng/mL (ref >3.0)

## 2022-09-17 ENCOUNTER — Telehealth: Payer: Self-pay

## 2022-09-17 NOTE — Progress Notes (Cosign Needed)
Note marked as an error. Patient was switched to non CMCS during assessment call.  Rance Muir, RMA

## 2022-10-18 ENCOUNTER — Encounter: Payer: Self-pay | Admitting: Neurology

## 2022-10-18 ENCOUNTER — Ambulatory Visit (INDEPENDENT_AMBULATORY_CARE_PROVIDER_SITE_OTHER): Payer: Medicare HMO | Admitting: Neurology

## 2022-10-18 VITALS — BP 120/67 | HR 94 | Ht 63.0 in | Wt 214.8 lb

## 2022-10-18 DIAGNOSIS — E669 Obesity, unspecified: Secondary | ICD-10-CM

## 2022-10-18 DIAGNOSIS — G4733 Obstructive sleep apnea (adult) (pediatric): Secondary | ICD-10-CM

## 2022-10-18 NOTE — Progress Notes (Signed)
Subjective:    [name] is a 77 y.o. female.  HPI    [name], MD, PhD [facility] [address] [address] [place], [place] [address]  Dear [name],  I saw your patient, Catherine Hinton, upon your kind request in my sleep clinic today for initial consultation of her sleep disorder, in particular, evaluation of her prior diagnosis of obstructive sleep apnea.  The patient is unaccompanied today.  As you know, [name] is a 77 year old female with an underlying medical history of diabetes, neuropathy, glaucoma, gout, hypertension, hyperlipidemia, chronic kidney disease, thyroid disease and obesity, who was diagnosed with severe obstructive sleep apnea in 2005. Her Epworth sleepiness score is 2 out of 24, fatigue severity score is 44 out of 63.  She does not currently have a DME provider.  I was able to review her sleep study report from 02/01/2004.  She had sleep testing through [facility] at the time.  Weight was documented at 230 pounds at the time.  Her sleep efficiency was 35%, sleep latency was 93 minutes, she had episodes of REM sleep.  She had an increased percentage of stage II sleep at 72%.  Overall AHI was in the severe range at 106.3/h, average oxygen saturation was 94%, nadir was 70% with 83% of the study with a saturation of less than 90%.  She has been on PAP therapy for years.  Last year she received a new machine but it was not covered by her insurance and she paid out-of-pocket.  She reports that she saw someone in a virtual visit who prescribed a machine.  She has had this machine since March 2023.  She does have a ResMed air sense 10 AutoSet machine and I was able to review her compliance data for the past 90 days, she has been under percent on her machine, it is on the default setting of 4 to 20 cm, EPR of 1.  95th percentile of pressure at 13.6 cm, leak on the higher side with the 95th percentile at 18.9 L/min,  residual AHI at goal at 1/h.  Average usage 9 hours and 19 minutes.  Percent use days greater than 4 hours at 99%, indicating excellent compliance.  She reports that she is sleepy during the day but attributes this to her medications.  She has been seeing neurology through [facility] clinic.  Of note, she is on high-dose gabapentin, 2100 mg daily.  She also takes Cymbalta 60 mg twice daily.  She has been on NP thyroid.  She is using a nasal mask.  She tried nasal pillows but did not tolerate them.  Bedtime is generally around 8 PM but she likes to read for couple hours, rise time is generally between 7 and 8.  She denies nightly nocturia or recurrent morning headaches.  She drinks caffeine in the form of coffee, generally 1 cup in the morning, 1 or 2 sodas per week.  She does not drink any alcohol.  She is a non-smoker.  She has 2 daughters with sleep apnea.  Her Past Medical History Is Significant For: Past Medical History:  Diagnosis Date   Chronic kidney disease 04/25/2015   Diabetes mellitus with renal manifestation (HCC)    Diabetes mellitus without complication (HCC)    Glaucoma    Gout    Hyperlipidemia    Hypertension    Hypertensive CKD (chronic kidney disease)    Neuropathy    feet   Obesity  Sleep apnea    CPAP    Her Past Surgical History Is Significant For: Past Surgical History:  Procedure Laterality Date   ABDOMINAL HYSTERECTOMY     BREAST CYST ASPIRATION     CHOLECYSTECTOMY     COLONOSCOPY WITH PROPOFOL N/A 02/01/2020   Procedure: COLONOSCOPY WITH PROPOFOL;  Surgeon: [name], MD;  Location: [facility];  Service: Endoscopy;  Laterality: N/A;  Diabetic - oral meds sleep apnea priority 4   HERNIA REPAIR     THUMB FUSION      Her Family History Is Significant For: Family History  Problem Relation Age of Onset   Diabetes Mother    Cancer Mother        Pituitary tumor   Hyperlipidemia Mother    Hypertension Mother    Thyroid disease Mother    Stroke  Mother    Cancer Father        Lung   Cancer Brother        Oral   Cancer Maternal Grandmother        colon   Blindness Maternal Grandfather    Arthritis Paternal Grandmother    Sleep apnea Daughter    Arthritis Daughter        RA   Sleep apnea Daughter    Hypertension Son    Breast cancer Neg Hx     Her Social History Is Significant For: Social History   Socioeconomic History   Marital status: Divorced    Spouse name: Not on file   Number of children: Not on file   Years of education: Not on file   Highest education level: Associate degree: academic program  Occupational History   Occupation: retired  Tobacco Use   Smoking status: Never   Smokeless tobacco: Never  Vaping Use   Vaping Use: Never used  Substance and Sexual Activity   Alcohol use: No    Alcohol/week: 0.0 standard drinks of alcohol   Drug use: No   Sexual activity: Not Currently  Other Topics Concern   Not on file  Social History Narrative   Not on file   Social Determinants of Health   Financial Resource Strain: High Risk (04/30/2022)   Overall Financial Resource Strain (CARDIA)    Difficulty of Paying Living Expenses: Hard  Food Insecurity: No Food Insecurity (04/19/2022)   Hunger Vital Sign    Worried About Running Out of Food in the Last Year: Never true    Ran Out of Food in the Last Year: Never true  Transportation Needs: Unmet Transportation Needs (04/19/2022)   PRAPARE - Administrator, Civil Service (Medical): Yes    Lack of Transportation (Non-Medical): No  Physical Activity: Inactive (04/30/2022)   Exercise Vital Sign    Days of Exercise per Week: 0 days    Minutes of Exercise per Session: 0 min  Stress: Stress Concern Present (04/19/2022)   [employer] of Occupational Health - Occupational Stress Questionnaire    Feeling of Stress : Very much  Social Connections: Socially Isolated (04/19/2022)   Social Connection and Isolation Panel [NHANES]    Frequency of  Communication with Friends and Family: Three times a week    Frequency of Social Gatherings with Friends and Family: Once a week    Attends Religious Services: Never    Database administrator or Organizations: No    Attends Banker Meetings: Never    Marital Status: Divorced    Her Allergies Are:  Allergies  Allergen Reactions   Niacin Itching   Sulfa Antibiotics Other (See Comments)    Fever  :   Her Current Medications Are:  Outpatient Encounter Medications as of 10/18/2022  Medication Sig   allopurinol (ZYLOPRIM) 300 MG tablet Take 1 tablet (300 mg total) by mouth daily.   aspirin 81 MG EC tablet Take 81 mg by mouth daily.    Blood Glucose Monitoring Suppl (TRUE METRIX AIR GLUCOSE METER) w/Device KIT Use to check blood sugar 2-3 times a day and document.  Bring log to visits.   Cholecalciferol 25 MCG (1000 UT) tablet Take 2,000 Units by mouth daily.    DHA-EPA-Flaxseed Oil-Vitamin E (THERA TEARS NUTRITION PO) Take 1 mg by mouth 2 (two) times daily.    DULoxetine (CYMBALTA) 30 MG capsule Start out with one capsule (30 MG) once daily by mouth for one week and if tolerating at week two may increase to 2 tablets (60 MG) by mouth daily.   gabapentin (NEURONTIN) 300 MG capsule TAKE 2 CAPSULES EVERY MORNING, AND THEN 2 CAPSULES AT 2 PM, AND THEN TAKE 3 CAPSULES EVERY NIGHT BEFORE BEDTIME.   glucose blood (TRUE METRIX BLOOD GLUCOSE TEST) test strip TEST BLOOD SUGAR 2 TO 3 TIMES DAILY AND DOCUMENT. BRING LOG TO VISITS.   metFORMIN (GLUCOPHAGE-XR) 500 MG 24 hr tablet TAKE 2 TABLETS EVERY MORNING  AND TAKE 2 TABLETS AT BEDTIME   metoprolol succinate (TOPROL-XL) 50 MG 24 hr tablet Take 1 tablet (50 mg total) by mouth daily. Take with or immediately following a meal.   MIEBO 1.338 GM/ML SOLN Apply to eye.   Multiple Vitamins-Minerals (EYE VITAMINS PO) Take 1 tablet by mouth daily.   NP THYROID 15 MG tablet Take 15 mg by mouth every morning.   rosuvastatin (CRESTOR) 20 MG tablet Take  1 tablet (20 mg total) by mouth daily.   thyroid (ARMOUR) 30 MG tablet Take 15 mg by mouth daily before breakfast.   TRUEplus Lancets 33G MISC Use to check blood sugar 2 to 3 times a day and document. Bring log to visits.   valsartan-hydrochlorothiazide (DIOVAN-HCT) 80-12.5 MG tablet Take 1 tablet by mouth daily.   vitamin B-12 (CYANOCOBALAMIN) 1000 MCG tablet Take 1,000 mcg by mouth daily.   XIIDRA 5 % SOLN Place 1 drop into both eyes 2 (two) times daily.    No facility-administered encounter medications on file as of 10/18/2022.  :   Review of Systems:  Out of a complete 14 point review of systems, all are reviewed and negative with the exception of these symptoms as listed below:          Review of Systems  Neurological:        Pt here for sleep consult Pt states last sleep study was 19 years ago Pt states had to pay out of pocket for current CPAP machine . Set up date 08/2021  Pt still has fatigue Pt states due to neuropathy    ESS:2 FSS:44    Objective:  Neurological Exam  Physical Exam Physical Examination:   Vitals:   10/18/22 1522  BP: 120/67  Pulse: 94    General Examination: The patient is a very pleasant 77 y.o. female in no acute distress. She appears well-developed and well-nourished and well groomed.  She appears anxious, intermittent tremor noted in her lower lip and jaw, also intermittent eye twitching noted.  HEENT: Normocephalic, atraumatic, pupils are equal, round and reactive to light, extraocular tracking is good without limitation to gaze excursion or  nystagmus noted. Hearing is grossly intact. Face is symmetric with intermittent excessive eye blinking noted, intermittent lower jaw and lip tremor.  She has on airway examination moderate airway crowding secondary to small airway, tonsils absent.  Moderate mouth dryness noted.  No orofacial dyskinesias, tongue protrudes centrally and palate elevates symmetrically.  Neck circumference 17 three-quarter  inches.   Chest: Clear to auscultation without wheezing, rhonchi or crackles noted.  Heart: S1+S2+0, regular and normal without murmurs, rubs or gallops noted.   Abdomen: Soft, non-tender and non-distended.  Extremities: There is no pitting edema in the distal lower extremities bilaterally.   Skin: Warm and dry without trophic changes noted.   Musculoskeletal: exam reveals no obvious joint deformities.   Neurologically:  Mental status: The patient is awake, alert and oriented in all 4 spheres. Her immediate and remote memory, attention, language skills and fund of knowledge are appropriate. There is no evidence of aphasia, agnosia, apraxia or anomia. Speech is clear with normal prosody and enunciation. Thought process is linear. Mood is constricted, affect blunted.    Cranial nerves II - XII are as described above under HEENT exam.  Motor exam: Normal bulk, strength and tone is noted. There is no obvious action or resting tremor.  She has an intermittent action tremor in both upper extremities. Fine motor skills and coordination: grossly intact.  Cerebellar testing: No dysmetria or intention tremor. There is no truncal or gait ataxia.  Sensory exam: intact to light touch in the upper and lower extremities.  Gait, station and balance: She stands slowly and walks without a walking aid.  Assessment and Plan:   In summary, Catherine Hinton is a very pleasant 77 y.o.-year old female with an underlying medical history of diabetes, neuropathy, glaucoma, gout, hypertension, hyperlipidemia, chronic kidney disease, thyroid disease and obesity, who presents for evaluation of her obstructive sleep apnea of several years duration for which she has been on PAP therapy for years.  She was diagnosed with severe sleep apnea in 2005.  She has had some weight fluctuation, current weight is about 15 pounds less than when she was first diagnosed.  She purchased a AutoPap machine last year.  She is fully  compliant with it.  It is on the default pressure setting of minimum 4 cm pressure, maximum pressure 20 cm, average pressure is around 13.6 cm.  She uses a nasal mask.  She would like to get established with a local supplier, she currently does not have a DME company.  We will proceed with a home sleep test for reevaluation of her sleep apnea and update her diagnosis.  She is advised that she will not use her AutoPap for the test but can resume testing after.  She is commended for her treatment adherence.  We will plan a follow-up after testing.  I would be happy to refer her to her local DME provider so she can get ongoing supplies.  She has an updated machine.  I answered all her questions today and she was in agreement with our plan.   Thank you very much for allowing me to participate in the care of this nice patient. If I can be of any further assistance to you please do not hesitate to call me at [phone].  Sincerely,   [name], MD, PhD

## 2022-10-18 NOTE — Patient Instructions (Signed)
I will order a home sleep test for re-evaluation of your sleep apnea, as testing was in 2005. The home sleep test is done (HST) to re-establish/confirm the sleep apnea diagnosis and to get you supplies, you already have received an updated autoPAP machine last year. Our sleep lab staff will reach out to you to arrange for pickup and for tutorial of the test equipment. I will write for supplies after the HST confirms the OSA (obstructive sleep apnea) diagnosis. Please be reminded that you will not use your autoPAP machine during the night of testing, so we get diagnostic data, not treatment data. We will schedule a follow-up appointment after the test, typically within 31 to 89 days post treatment start. You will need to show compliance with usage and fulfill a minimum usage percentage.

## 2022-10-20 ENCOUNTER — Other Ambulatory Visit: Payer: Self-pay | Admitting: Nurse Practitioner

## 2022-10-22 ENCOUNTER — Telehealth: Payer: Medicare HMO

## 2022-10-22 NOTE — Telephone Encounter (Signed)
Requested Prescriptions  Pending Prescriptions Disp Refills   DULoxetine (CYMBALTA) 30 MG capsule [Pharmacy Med Name: DULOXETINE HCL 30 MG Capsule Delayed Release Particles] 180 capsule 1    Sig: START OUT WITH 1 CAPSULE ONCE DAILY FOR ONE WEEK AND IF TOLERATING MAY INCREASE TO 2 CAPSULES DAILY.     Psychiatry: Antidepressants - SNRI - duloxetine Failed - 10/20/2022  2:29 PM      Failed - Cr in normal range and within 360 days    Creatinine, Ser  Date Value Ref Range Status  09/07/2022 0.55 (L) 0.57 - 1.00 mg/dL Final         Passed - eGFR is 30 or above and within 360 days    GFR calc Af Amer  Date Value Ref Range Status  07/12/2020 105 >59 mL/min/1.73 Final    Comment:    **In accordance with recommendations from the NKF-ASN Task force,**   [facility] is in the process of updating its eGFR calculation to the   2021 CKD-EPI creatinine equation that estimates kidney function   without a race variable.    GFR calc non Af Amer  Date Value Ref Range Status  07/12/2020 91 >59 mL/min/1.73 Final   eGFR  Date Value Ref Range Status  09/07/2022 96 >59 mL/min/1.73 Final         Passed - Completed PHQ-2 or PHQ-9 in the last 360 days      Passed - Last BP in normal range    BP Readings from Last 1 Encounters:  10/18/22 120/67         Passed - Valid encounter within last 6 months    Recent Outpatient Visits           1 month ago Type 2 diabetes mellitus with diabetic polyneuropathy, without long-term current use of insulin (HCC)   [place] [facility] [place], Braddock T, NP   4 months ago Type 2 diabetes mellitus with diabetic mononeuropathy, without long-term current use of insulin (HCC)   [place] [facility] [place], Parkline T, NP   6 months ago Hyperthyroidism   [place] [facility] [place], Ames T, NP   7 months ago Hypertension associated with diabetes (HCC)   [place] [facility] [place], [name] T, NP   8 months  ago Type 2 diabetes mellitus with diabetic mononeuropathy, without long-term current use of insulin (HCC)   [place] [facility] [place], Dorie Rank, NP       Future Appointments             In 1 month [place], Dorie Rank, NP [place] [facility]

## 2022-10-25 ENCOUNTER — Telehealth: Payer: Self-pay | Admitting: Neurology

## 2022-10-25 NOTE — Telephone Encounter (Signed)
HST- [employer] no auth req   Patient is scheduled at [facility] For 11/14/22 at 3 pm.  Mailed packet to the patient.

## 2022-10-29 ENCOUNTER — Encounter: Payer: Self-pay | Admitting: Nurse Practitioner

## 2022-10-30 DIAGNOSIS — H26492 Other secondary cataract, left eye: Secondary | ICD-10-CM | POA: Diagnosis not present

## 2022-10-30 DIAGNOSIS — H02883 Meibomian gland dysfunction of right eye, unspecified eyelid: Secondary | ICD-10-CM | POA: Diagnosis not present

## 2022-10-30 DIAGNOSIS — H353131 Nonexudative age-related macular degeneration, bilateral, early dry stage: Secondary | ICD-10-CM | POA: Diagnosis not present

## 2022-10-30 DIAGNOSIS — H02886 Meibomian gland dysfunction of left eye, unspecified eyelid: Secondary | ICD-10-CM | POA: Diagnosis not present

## 2022-10-30 DIAGNOSIS — E119 Type 2 diabetes mellitus without complications: Secondary | ICD-10-CM | POA: Diagnosis not present

## 2022-10-30 DIAGNOSIS — H40013 Open angle with borderline findings, low risk, bilateral: Secondary | ICD-10-CM | POA: Diagnosis not present

## 2022-10-30 LAB — HM DIABETES EYE EXAM

## 2022-11-13 DIAGNOSIS — R200 Anesthesia of skin: Secondary | ICD-10-CM | POA: Diagnosis not present

## 2022-11-13 DIAGNOSIS — R202 Paresthesia of skin: Secondary | ICD-10-CM | POA: Diagnosis not present

## 2022-11-13 DIAGNOSIS — E1349 Other specified diabetes mellitus with other diabetic neurological complication: Secondary | ICD-10-CM | POA: Diagnosis not present

## 2022-11-13 DIAGNOSIS — R278 Other lack of coordination: Secondary | ICD-10-CM | POA: Diagnosis not present

## 2022-11-13 DIAGNOSIS — G629 Polyneuropathy, unspecified: Secondary | ICD-10-CM | POA: Diagnosis not present

## 2022-11-14 ENCOUNTER — Ambulatory Visit: Payer: Medicare HMO | Admitting: Neurology

## 2022-11-14 DIAGNOSIS — R6889 Other general symptoms and signs: Secondary | ICD-10-CM | POA: Diagnosis not present

## 2022-11-14 DIAGNOSIS — E669 Obesity, unspecified: Secondary | ICD-10-CM

## 2022-11-14 DIAGNOSIS — G4733 Obstructive sleep apnea (adult) (pediatric): Secondary | ICD-10-CM

## 2022-11-14 DIAGNOSIS — G4734 Idiopathic sleep related nonobstructive alveolar hypoventilation: Secondary | ICD-10-CM

## 2022-11-15 DIAGNOSIS — R6889 Other general symptoms and signs: Secondary | ICD-10-CM | POA: Diagnosis not present

## 2022-11-15 NOTE — Progress Notes (Signed)
See procedure note.

## 2022-11-15 NOTE — Procedures (Signed)
[facility]  HOME SLEEP TEST (Watch PAT) REPORT  STUDY DATE: 11/14/22  DOB: 01-06-1946  MRN: [id number]  ORDERING CLINICIAN: [name], MD, PhD   REFERRING CLINICIAN: Marjie Skiff, NP   CLINICAL INFORMATION/HISTORY: 77 year old female with an underlying medical history of diabetes, neuropathy, glaucoma, gout, hypertension, hyperlipidemia, chronic kidney disease, thyroid disease and obesity, who was diagnosed with severe obstructive sleep apnea in 2005.  She presents for reevaluation.  She has been compliant with AutoPap therapy.  Epworth sleepiness score: 2/24.  BMI: 37.9 kg/m  FINDINGS:   Sleep Summary:   Total Recording Time (hours, min): 10 hours, 2 min  Total Sleep Time (hours, min):  9 hours, 22 min  Percent REM (%):    18.8%   Respiratory Indices:   Calculated pAHI (per hour):  23.7/hour    -   utilizing the 4% desaturation criteria for hypopneas per Medicare guidelines         REM pAHI:    12.6/hour       NREM pAHI: 26.2/hour  Central pAHI: 1.6/hour  Oxygen Saturation Statistics:    Oxygen Saturation (%) Mean: 91%   Minimum oxygen saturation (%):                 77%   O2 Saturation Range (%): 77 -98%    O2 Saturation (minutes) <=88%: 14 min  Pulse Rate Statistics:   Pulse Mean (bpm):    73/min    Pulse Range (49-95/min)   IMPRESSION: OSA (obstructive sleep apnea) moderate Nnocturnal Hypoxemia  RECOMMENDATION:  This home sleep test demonstrates moderate obstructive sleep apnea with a total AHI of 23.7/hour and O2 nadir of 77% with time below or at 88% saturation of 14 minutes for the night, indicating nocturnal hypoxemia.  Snoring was noted in the mild to moderate range, rarely in the louder range.  Ongoing treatment with a positive airway pressure (PAP) device is recommended. The patient is established on an AutoPap machine.  She will be advised to continue with AutoPap therapy.  A full night titration study may be  considered to optimize treatment settings, monitor proper oxygen saturations and aid with improvement of tolerance and adherence, if needed down the road. Alternative treatment options may include a dental device through dentistry or orthodontics in selected patients or Inspire (hypoglossal nerve stimulator) in carefully selected patients (meeting inclusion criteria).  Concomitant weight loss is recommended (where clinically appropriate). Please note that untreated obstructive sleep apnea may carry additional perioperative morbidity. Patients with significant obstructive sleep apnea should receive perioperative PAP therapy and the surgeons and particularly the anesthesiologist should be informed of the diagnosis and the severity of the sleep disordered breathing. The patient should be cautioned not to drive, work at heights, or operate dangerous or heavy equipment when tired or sleepy. Review and reiteration of good sleep hygiene measures should be pursued with any patient. Other causes of the patient's symptoms, including circadian rhythm disturbances, an underlying mood disorder, medication effect and/or an underlying medical problem cannot be ruled out based on this test. Clinical correlation is recommended.  The patient and her referring provider will be notified of the test results. The patient will be seen in follow up in sleep clinic at [facility].  I certify that I have reviewed the raw data recording prior to the issuance of this report in accordance with the standards of the American Academy of Sleep Medicine (AASM).   INTERPRETING PHYSICIAN:   [name], MD, PhD Medical Director, [facility] Sleep  at [facility]) Diplomat, ABPN (Neurology and Sleep)   [facility] [address], [address] [place], [place] [address] [phone]

## 2022-11-20 ENCOUNTER — Telehealth: Payer: Self-pay

## 2022-11-20 DIAGNOSIS — G4733 Obstructive sleep apnea (adult) (pediatric): Secondary | ICD-10-CM

## 2022-11-20 NOTE — Telephone Encounter (Signed)
-----   Message from [name], MD sent at 11/15/2022  7:38 PM EDT ----- Patient referred by PCP, seen by me on 10/18/2022, patient had a HST on 11/14/2022.    Please call and notify the patient that the recent home sleep test showed obstructive sleep apnea in the moderate range.  She has been on AutoPap therapy and has been compliant with treatment.  She received a new machine last year and should continue with treatment at the current settings, we can look into the insurance approving her for a new machine, I am not sure if she is eligible for a new machine through her insurance.  We can send supply order to a DME company of her choosing.  She can follow-up routinely in 1 year but if she is indeed eligible for new machine, she will need to follow-up within 3 months of set up.   I have not placed an order for a new machine or supplies as yet.  Please put in a PAP supply order after talking to machine and send to DME company of her choice. [name], MD, PhD [facility])

## 2022-11-20 NOTE — Telephone Encounter (Signed)
Orders have been sent to [employer]. Awaiting confirmation of receipt. The patient has been informed to call back upon set up to schedule appointment within appropriate date range (per insurance requirements). I will also send the patient a mychart message informing her to schedule within that time frame.

## 2022-11-20 NOTE — Telephone Encounter (Signed)
I spoke with the patient and relayed the results of the HST. She was agreeable to getting a new machine. She will call back to schedule an appointment within the 3 month time frame once her machine is set up. She verbalized understanding of the findings and expressed appreciation for the call.  Community message will be sent after MD cosigns order to [employer]. I will add a note to [employer] community message requesting a DME location near [place], [place] (per patient request).

## 2022-11-20 NOTE — Telephone Encounter (Signed)
Order placed for new AutoPap machine, patient will need to follow-up in sleep clinic within 3 months of set up date.

## 2022-11-21 NOTE — Telephone Encounter (Signed)
I have received confirmation of receipt.

## 2022-11-22 ENCOUNTER — Telehealth: Payer: Self-pay

## 2022-11-22 ENCOUNTER — Encounter: Payer: Self-pay | Admitting: Nurse Practitioner

## 2022-11-22 DIAGNOSIS — E042 Nontoxic multinodular goiter: Secondary | ICD-10-CM

## 2022-11-22 DIAGNOSIS — E0590 Thyrotoxicosis, unspecified without thyrotoxic crisis or storm: Secondary | ICD-10-CM

## 2022-11-22 NOTE — Progress Notes (Cosign Needed)
Care Management & Coordination Services Pharmacy Team  Reason for Encounter: Diabetes  Contacted patient to discuss diabetes disease state. Spoke with patient on 11/22/2022   Current antihyperglycemic regimen:  Metformin 500 mg 2 tabs in am and 2 tabs in pm   Patient verbally confirms she is taking the above medications as directed. Yes  What diet changes have been made to improve diabetes control? No, but does have a small garden with lettuce, cucumbers, tomatoes,squash and plans to eat more vegetables.  What recent interventions/DTPs have been made to improve glycemic control:  Will continue Metformin XR 1000 MG BID and discussed with her may reduce in future. Focus on diabetic diet at home and regular exercise.   Have there been any recent hospitalizations or ED visits since last visit with PharmD? No  Patient denies hypoglycemic symptoms, including None  Patient denies hyperglycemic symptoms, including none  How often are you checking your blood sugar? once daily  What are your blood sugars ranging? 125-135 before she eats breakfast  During the week, how often does your blood glucose drop below 70? none  Are you checking your feet daily/regularly? Yes  Adherence Review: Is the patient currently on a STATIN medication? Yes Is the patient currently on ACE/ARB medication? Yes Does the patient have >5 day gap between last estimated fill dates? No   Chart Updates:  Recent office visits:  09/07/22 Dante Gang, NP (Diabetes) Orders placed: Labs and referral to sleep study. Medication changes: Gabapentin 300 mg  Recent consult visits:  11/13/22 Theora Master, MD [facility] (Numbness/tingling in hands and feet) No orders or medication changes  10/21/22 [name], MD Neurology (OSA on CPAP) Initial visit  Hospital visits:  None in previous 6 months  Medications: Outpatient Encounter Medications as of 11/22/2022  Medication Sig   allopurinol (ZYLOPRIM) 300 MG tablet  Take 1 tablet (300 mg total) by mouth daily.   aspirin 81 MG EC tablet Take 81 mg by mouth daily.    Blood Glucose Monitoring Suppl (TRUE METRIX AIR GLUCOSE METER) w/Device KIT Use to check blood sugar 2-3 times a day and document.  Bring log to visits.   Cholecalciferol 25 MCG (1000 UT) tablet Take 2,000 Units by mouth daily.    DHA-EPA-Flaxseed Oil-Vitamin E (THERA TEARS NUTRITION PO) Take 1 mg by mouth 2 (two) times daily.    DULoxetine (CYMBALTA) 30 MG capsule START OUT WITH 1 CAPSULE ONCE DAILY FOR ONE WEEK AND IF TOLERATING MAY INCREASE TO 2 CAPSULES DAILY.   gabapentin (NEURONTIN) 300 MG capsule TAKE 2 CAPSULES EVERY MORNING, AND THEN 2 CAPSULES AT 2 PM, AND THEN TAKE 3 CAPSULES EVERY NIGHT BEFORE BEDTIME.   glucose blood (TRUE METRIX BLOOD GLUCOSE TEST) test strip TEST BLOOD SUGAR 2 TO 3 TIMES DAILY AND DOCUMENT. BRING LOG TO VISITS.   metFORMIN (GLUCOPHAGE-XR) 500 MG 24 hr tablet TAKE 2 TABLETS EVERY MORNING  AND TAKE 2 TABLETS AT BEDTIME   metoprolol succinate (TOPROL-XL) 50 MG 24 hr tablet Take 1 tablet (50 mg total) by mouth daily. Take with or immediately following a meal.   MIEBO 1.338 GM/ML SOLN Apply to eye.   Multiple Vitamins-Minerals (EYE VITAMINS PO) Take 1 tablet by mouth daily.   NP THYROID 15 MG tablet Take 15 mg by mouth every morning.   rosuvastatin (CRESTOR) 20 MG tablet Take 1 tablet (20 mg total) by mouth daily.   thyroid (ARMOUR) 30 MG tablet Take 15 mg by mouth daily before breakfast.   TRUEplus Lancets  33G MISC Use to check blood sugar 2 to 3 times a day and document. Bring log to visits.   valsartan-hydrochlorothiazide (DIOVAN-HCT) 80-12.5 MG tablet Take 1 tablet by mouth daily.   vitamin B-12 (CYANOCOBALAMIN) 1000 MCG tablet Take 1,000 mcg by mouth daily.   XIIDRA 5 % SOLN Place 1 drop into both eyes 2 (two) times daily.    No facility-administered encounter medications on file as of 11/22/2022.    Recent Relevant Labs: Lab Results  Component Value Date/Time    HGBA1C 7.1 (H) 09/07/2022 01:41 PM   HGBA1C 6.7 (H) 06/06/2022 01:43 PM   HGBA1C 6.8 11/06/2017 12:00 AM   MICROALBUR 10 09/07/2022 01:41 PM   MICROALBUR 30 (H) 08/15/2021 09:32 AM    Kidney Function Lab Results  Component Value Date/Time   CREATININE 0.55 (L) 09/07/2022 01:44 PM   CREATININE 0.60 06/06/2022 01:45 PM   GFRNONAA 91 07/12/2020 09:47 AM   GFRAA 105 07/12/2020 09:47 AM    Star Rating Drugs:  Medication:  Last Fill: Day Supply Metformin 500 mg 09/09/22, 06/14/22 90 Rosuvastatin 20 mg 07/27/22,05/10/22 90 Valsartan-hctz 80-12.5 mg 09/20/22,07/09/22  90  Care Gaps: Annual wellness visit in last year? Yes, 04/19/22 Last eye exam / retinopathy screening:08/29/21 Last diabetic foot exam:02/21/22   Catherine Hinton

## 2022-11-26 ENCOUNTER — Encounter: Payer: Self-pay | Admitting: Nurse Practitioner

## 2022-11-28 DIAGNOSIS — E6609 Other obesity due to excess calories: Secondary | ICD-10-CM | POA: Diagnosis not present

## 2022-11-28 DIAGNOSIS — Z6835 Body mass index (BMI) 35.0-35.9, adult: Secondary | ICD-10-CM | POA: Diagnosis not present

## 2022-11-28 DIAGNOSIS — D440 Neoplasm of uncertain behavior of thyroid gland: Secondary | ICD-10-CM | POA: Diagnosis not present

## 2022-11-28 DIAGNOSIS — E790 Hyperuricemia without signs of inflammatory arthritis and tophaceous disease: Secondary | ICD-10-CM | POA: Diagnosis not present

## 2022-11-28 DIAGNOSIS — I10 Essential (primary) hypertension: Secondary | ICD-10-CM | POA: Diagnosis not present

## 2022-11-28 DIAGNOSIS — E042 Nontoxic multinodular goiter: Secondary | ICD-10-CM | POA: Diagnosis not present

## 2022-11-28 DIAGNOSIS — G629 Polyneuropathy, unspecified: Secondary | ICD-10-CM | POA: Diagnosis not present

## 2022-11-28 DIAGNOSIS — E1165 Type 2 diabetes mellitus with hyperglycemia: Secondary | ICD-10-CM | POA: Diagnosis not present

## 2022-11-28 DIAGNOSIS — F411 Generalized anxiety disorder: Secondary | ICD-10-CM | POA: Diagnosis not present

## 2022-12-05 DIAGNOSIS — F411 Generalized anxiety disorder: Secondary | ICD-10-CM | POA: Diagnosis not present

## 2022-12-05 DIAGNOSIS — G629 Polyneuropathy, unspecified: Secondary | ICD-10-CM | POA: Diagnosis not present

## 2022-12-05 DIAGNOSIS — E042 Nontoxic multinodular goiter: Secondary | ICD-10-CM | POA: Diagnosis not present

## 2022-12-05 DIAGNOSIS — E790 Hyperuricemia without signs of inflammatory arthritis and tophaceous disease: Secondary | ICD-10-CM | POA: Diagnosis not present

## 2022-12-05 DIAGNOSIS — I10 Essential (primary) hypertension: Secondary | ICD-10-CM | POA: Diagnosis not present

## 2022-12-05 DIAGNOSIS — E6609 Other obesity due to excess calories: Secondary | ICD-10-CM | POA: Diagnosis not present

## 2022-12-05 DIAGNOSIS — Z6834 Body mass index (BMI) 34.0-34.9, adult: Secondary | ICD-10-CM | POA: Diagnosis not present

## 2022-12-05 DIAGNOSIS — E1165 Type 2 diabetes mellitus with hyperglycemia: Secondary | ICD-10-CM | POA: Diagnosis not present

## 2022-12-09 NOTE — Patient Instructions (Signed)
Be Involved in Your Health Care:  Taking Medications When medications are taken as directed, they can greatly improve your health. But if they are not taken as instructed, they may not work. In some cases, not taking them correctly can be harmful. To help ensure your treatment remains effective and safe, understand your medications and how to take them.  Your lab results, notes and after visit summary will be available on My Chart. We strongly encourage you to use this feature. If lab results are abnormal the clinic will contact you with the appropriate steps. If the clinic does not contact you assume the results are satisfactory. You can always see your results on My Chart. If you have questions regarding your condition, please contact the clinic during office hours. You can also ask questions on My Chart.  We at [facility] are grateful that you chose us to provide care. We strive to provide excellent and compassionate care and are always looking for feedback. If you get a survey from the clinic please complete this.   Diabetes Mellitus Basics  Diabetes mellitus, or diabetes, is a long-term (chronic) disease. It occurs when the body does not properly use sugar (glucose) that is released from food after you eat. Diabetes mellitus may be caused by one or both of these problems: Your pancreas does not make enough of a hormone called insulin. Your body does not react in a normal way to the insulin that it makes. Insulin lets glucose enter cells in your body. This gives you energy. If you have diabetes, glucose cannot get into cells. This causes high blood glucose (hyperglycemia). How to treat and manage diabetes You may need to take insulin or other diabetes medicines daily to keep your glucose in balance. If you are prescribed insulin, you will learn how to give yourself insulin by injection. You may need to adjust the amount of insulin you take based on the foods that you eat. You will  need to check your blood glucose levels using a glucose monitor as told by your health care provider. The readings can help determine if you have low or high blood glucose. Generally, you should have these blood glucose levels: Before meals (preprandial): 80-130 mg/dL (4.4-7.2 mmol/L). After meals (postprandial): below 180 mg/dL (10 mmol/L). Hemoglobin A1c (HbA1c) level: less than 7%. Your health care provider will set treatment goals for you. Keep all follow-up visits. This is important. Follow these instructions at home: Diabetes medicines Take your diabetes medicines every day as told by your health care provider. List your diabetes medicines here: Name of medicine: ______________________________ Amount (dose): _______________ Time (a.m./p.m.): _______________ Notes: ___________________________________ Name of medicine: ______________________________ Amount (dose): _______________ Time (a.m./p.m.): _______________ Notes: ___________________________________ Name of medicine: ______________________________ Amount (dose): _______________ Time (a.m./p.m.): _______________ Notes: ___________________________________ Insulin If you use insulin, list the types of insulin you use here: Insulin type: ______________________________ Amount (dose): _______________ Time (a.m./p.m.): _______________Notes: ___________________________________ Insulin type: ______________________________ Amount (dose): _______________ Time (a.m./p.m.): _______________ Notes: ___________________________________ Insulin type: ______________________________ Amount (dose): _______________ Time (a.m./p.m.): _______________ Notes: ___________________________________ Insulin type: ______________________________ Amount (dose): _______________ Time (a.m./p.m.): _______________ Notes: ___________________________________ Insulin type: ______________________________ Amount (dose): _______________ Time (a.m./p.m.): _______________  Notes: ___________________________________ Managing blood glucose  Check your blood glucose levels using a glucose monitor as told by your health care provider. Write down the times that you check your glucose levels here: Time: _______________ Notes: ___________________________________ Time: _______________ Notes: ___________________________________ Time: _______________ Notes: ___________________________________ Time: _______________ Notes: ___________________________________ Time: _______________ Notes: ___________________________________ Time: _______________ Notes: ___________________________________    Low blood glucose Low blood glucose (hypoglycemia) is when glucose is at or below 70 mg/dL (3.9 mmol/L). Symptoms may include: Feeling: Hungry. Sweaty and clammy. Irritable or easily upset. Dizzy. Sleepy. Having: A fast heartbeat. A headache. A change in your vision. Numbness around the mouth, lips, or tongue. Having trouble with: Moving (coordination). Sleeping. Treating low blood glucose To treat low blood glucose, eat or drink something containing sugar right away. If you can think clearly and swallow safely, follow the 15:15 rule: Take 15 grams of a fast-acting carb (carbohydrate), as told by your health care provider. Some fast-acting carbs are: Glucose tablets: take 3-4 tablets. Hard candy: eat 3-5 pieces. Fruit juice: drink 4 oz (120 mL). Regular (not diet) soda: drink 4-6 oz (120-180 mL). Honey or sugar: eat 1 Tbsp (15 mL). Check your blood glucose levels 15 minutes after you take the carb. If your glucose is still at or below 70 mg/dL (3.9 mmol/L), take 15 grams of a carb again. If your glucose does not go above 70 mg/dL (3.9 mmol/L) after 3 tries, get help right away. After your glucose goes back to normal, eat a meal or a snack within 1 hour. Treating very low blood glucose If your glucose is at or below 54 mg/dL (3 mmol/L), you have very low blood glucose  (severe hypoglycemia). This is an emergency. Do not wait to see if the symptoms will go away. Get medical help right away. Call your local emergency services (911 in the [place] Do not drive yourself to the hospital. Questions to ask your health care provider Should I talk with a diabetes educator? What equipment will I need to care for myself at home? What diabetes medicines do I need? When should I take them? How often do I need to check my blood glucose levels? What number can I call if I have questions? When is my follow-up visit? Where can I find a support group for people with diabetes? Where to find more information American Diabetes Association: www.diabetes.org Association of Diabetes Care and Education Specialists: www.diabeteseducator.org Contact a health care provider if: Your blood glucose is at or above 240 mg/dL (13.3 mmol/L) for 2 days in a row. You have been sick or have had a fever for 2 days or more, and you are not getting better. You have any of these problems for more than 6 hours: You cannot eat or drink. You feel nauseous. You vomit. You have diarrhea. Get help right away if: Your blood glucose is lower than 54 mg/dL (3 mmol/L). You get confused. You have trouble thinking clearly. You have trouble breathing. These symptoms may represent a serious problem that is an emergency. Do not wait to see if the symptoms will go away. Get medical help right away. Call your local emergency services (911 in the [place] Do not drive yourself to the hospital. Summary Diabetes mellitus is a chronic disease that occurs when the body does not properly use sugar (glucose) that is released from food after you eat. Take insulin and diabetes medicines as told. Check your blood glucose every day, as often as told. Keep all follow-up visits. This is important. This information is not intended to replace advice given to you by your health care provider. Make sure you discuss any  questions you have with your health care provider. Document Revised: 10/06/2019 Document Reviewed: 10/06/2019 [employer] Patient Education  2024 [employer]  

## 2022-12-10 ENCOUNTER — Encounter: Payer: Self-pay | Admitting: Nurse Practitioner

## 2022-12-10 ENCOUNTER — Ambulatory Visit (INDEPENDENT_AMBULATORY_CARE_PROVIDER_SITE_OTHER): Payer: Medicare HMO | Admitting: Nurse Practitioner

## 2022-12-10 VITALS — BP 116/71 | HR 92 | Temp 98.8°F | Ht 62.99 in | Wt 212.2 lb

## 2022-12-10 DIAGNOSIS — E1159 Type 2 diabetes mellitus with other circulatory complications: Secondary | ICD-10-CM | POA: Diagnosis not present

## 2022-12-10 DIAGNOSIS — E559 Vitamin D deficiency, unspecified: Secondary | ICD-10-CM | POA: Diagnosis not present

## 2022-12-10 DIAGNOSIS — I152 Hypertension secondary to endocrine disorders: Secondary | ICD-10-CM | POA: Diagnosis not present

## 2022-12-10 DIAGNOSIS — E042 Nontoxic multinodular goiter: Secondary | ICD-10-CM | POA: Diagnosis not present

## 2022-12-10 DIAGNOSIS — I700 Atherosclerosis of aorta: Secondary | ICD-10-CM

## 2022-12-10 DIAGNOSIS — E785 Hyperlipidemia, unspecified: Secondary | ICD-10-CM | POA: Diagnosis not present

## 2022-12-10 DIAGNOSIS — Z23 Encounter for immunization: Secondary | ICD-10-CM | POA: Diagnosis not present

## 2022-12-10 DIAGNOSIS — G4733 Obstructive sleep apnea (adult) (pediatric): Secondary | ICD-10-CM

## 2022-12-10 DIAGNOSIS — E0590 Thyrotoxicosis, unspecified without thyrotoxic crisis or storm: Secondary | ICD-10-CM | POA: Diagnosis not present

## 2022-12-10 DIAGNOSIS — E538 Deficiency of other specified B group vitamins: Secondary | ICD-10-CM

## 2022-12-10 DIAGNOSIS — E1142 Type 2 diabetes mellitus with diabetic polyneuropathy: Secondary | ICD-10-CM

## 2022-12-10 DIAGNOSIS — E1169 Type 2 diabetes mellitus with other specified complication: Secondary | ICD-10-CM | POA: Diagnosis not present

## 2022-12-10 LAB — BAYER DCA HB A1C WAIVED: HB A1C (BAYER DCA - WAIVED): 7.2 % — ABNORMAL HIGH (ref 4.8–5.6)

## 2022-12-10 MED ORDER — DULOXETINE HCL 60 MG PO CPEP: 60.0000 mg | ORAL_CAPSULE | Freq: Every day | ORAL | 4 refills | Status: DC

## 2022-12-10 NOTE — Assessment & Plan Note (Signed)
Ongoing.  Noted on CT 09/18/19.  Continue to monitor and continue ASA + Lopid for prevention. 

## 2022-12-10 NOTE — Progress Notes (Signed)
BP 116/71   Pulse 92   Temp 98.8 F (37.1 C) (Oral)   Ht 5' 2.99" (1.6 m)   Wt 212 lb 3.2 oz (96.3 kg)   LMP  (LMP Unknown)   SpO2 98%   BMI 37.60 kg/m    Subjective:    [name], female    DOB: [birth year], 77 y.o.   MRN: [id number]  HPI: Catherine Hinton is a 77 y.o. female  Chief Complaint  Patient presents with   Diabetes   Hypertension   Hyperlipidemia   Hyperthyroidism   DIABETES A1c March 7.1%.  Continues on Metformin 1000 MG BID.  Continues Gabapentin & Duloxetine for neuropathy which offers benefit.  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    Reports using food as coping technique.   Hypoglycemic episodes:no Polydipsia/polyuria: no Visual disturbance: no Chest pain: no Paresthesias: no Glucose Monitoring: yes             Accucheck frequency: Daily             Fasting glucose: 122 this morning             Post prandial:             Evening:             Before meals: Taking Insulin?: no             Long acting insulin:             Short acting insulin: Blood Pressure Monitoring: daily Retinal Examination: Up to Date -- Dr. Clydene Pugh -- will attempt to get records Foot Exam: Up to Date Pneumovax: Up to Date Influenza: Up to Date Aspirin: yes    HYPERTENSION / HYPERLIPIDEMIA Continues on Metoprolol, Valsartan-HCTZ, ASA and Crestor.  Continues to use CPAP nightly -- last sleep study 11/14/22, moderates OSA -- has all new equipment. Satisfied with current treatment? yes Duration of hypertension: chronic BP monitoring frequency: a few times a week BP range: 110-120/70 at home on average BP medication side effects: no Duration of hyperlipidemia: chronic Cholesterol medication side effects: no Cholesterol supplements: none Medication compliance: good compliance Aspirin: yes Recent stressors: no Recurrent headaches:  no Visual changes: no Palpitations: no Dyspnea: no Chest pain: no Lower extremity edema: no Dizzy/lightheaded: over past couple days has had a few The ASCVD Risk score (Arnett DK, et al., 2019) failed to calculate for the following reasons:   The valid total cholesterol range is 130 to 320 mg/dL  OSTEOPENIA Taking daily Vitamin D supplement. Last DEXA 02/03/20. Satisfied with current treatment?: yes Past osteoporosis medications/treatments:  supplements Adequate calcium & vitamin D: yes Weight bearing exercises: yes    HYPERTHYROIDISM She saw Dr. Patrecia Pace last week (he is retiring in August and she has a new referral to alternate endo).  Recent TSH with him noted level 0.025 and Free T4 1.10, Free T3 4.0 -- continues on thyroid Armour 15 MG.  Continues on Metoprolol for HR control.  Had thyroid ultrasound two weeks ago which remains stable, multinodular goiter R>L. Fatigue: occasional Cold intolerance: no Heat intolerance: yes Weight gain: no Weight loss: no Constipation: no Diarrhea/loose stools: sometimes Palpitations: no Lower extremity edema: no Anxiety/depressed mood: occasional      12/10/2022   10:46 AM 09/07/2022    1:51 PM 06/06/2022    1:44 PM 04/24/2022    1:41 PM 04/19/2022   12:45  PM  Depression screen PHQ 2/9  Decreased Interest 0 0  1 2  Down, Depressed, Hopeless 0 0 1 1 0  PHQ - 2 Score 0 0 1 2 2   Altered sleeping 1 1 1 2 3   Tired, decreased energy 1 1 1 3 3   Change in appetite 1 1 1 1  0  Feeling bad or failure about yourself  0 0 0 0 0  Trouble concentrating 0 0 0 1 1  Moving slowly or fidgety/restless 0 0 0 0 1  Suicidal thoughts 0 0 0 0 0  PHQ-9 Score 3 3 4 9 10   Difficult doing work/chores Not difficult at all Somewhat difficult Not difficult at all Somewhat difficult Somewhat difficult       12/10/2022   10:46 AM 09/07/2022    1:52 PM 06/06/2022    1:44 PM 04/24/2022    1:41 PM  GAD 7 : Generalized Anxiety Score  Nervous, Anxious, on Edge 0 1 1  1   Control/stop worrying 0 0 0 1  Worry too much - different things 0 0 0 1  Trouble relaxing 0 0 0 1  Restless 0 0 0 0  Easily annoyed or irritable 0 0 1 0  Afraid - awful might happen 0 0 0 1  Total GAD 7 Score 0 1 2 5   Anxiety Difficulty Not difficult at all Not difficult at all Not difficult at all Somewhat difficult   Relevant past medical, surgical, family and social history reviewed and updated as indicated. Interim medical history since our last visit reviewed. Allergies and medications reviewed and updated.  Review of Systems  Constitutional:  Negative for activity change, appetite change, diaphoresis, fatigue and fever.  Respiratory:  Negative for cough, chest tightness, shortness of breath and wheezing.   Cardiovascular:  Negative for chest pain, palpitations and leg swelling.  Gastrointestinal: Negative.   Endocrine: Negative for cold intolerance, heat intolerance, polydipsia, polyphagia and polyuria.  Genitourinary: Negative.   Neurological: Negative.   Psychiatric/Behavioral: Negative.      Per HPI unless specifically indicated above     Objective:    BP 116/71   Pulse 92   Temp 98.8 F (37.1 C) (Oral)   Ht 5' 2.99" (1.6 m)   Wt 212 lb 3.2 oz (96.3 kg)   LMP  (LMP Unknown)   SpO2 98%   BMI 37.60 kg/m   Wt Readings from Last 3 Encounters:  12/10/22 212 lb 3.2 oz (96.3 kg)  10/18/22 214 lb 12.8 oz (97.4 kg)  09/07/22 212 lb 6.4 oz (96.3 kg)    Physical Exam Vitals and nursing note reviewed.  Constitutional:      General: She is awake. She is not in acute distress.    Appearance: She is well-developed. She is obese. She is not ill-appearing.  HENT:     Head: Normocephalic.     Right Ear: Hearing, tympanic membrane and ear canal normal. No drainage.     Left Ear: Hearing, tympanic membrane, ear canal and external ear normal. No drainage.     Nose: Nose normal.     Mouth/Throat:     Mouth: Mucous membranes are moist.     Pharynx: Oropharynx is clear.   Eyes:     General: Lids are normal.        Right eye: No discharge.        Left eye: No discharge.     Conjunctiva/sclera: Conjunctivae normal.     Pupils: Pupils are equal, round, and  reactive to light.  Neck:     Vascular: No carotid bruit.  Cardiovascular:     Rate and Rhythm: Normal rate and regular rhythm.     Heart sounds: Normal heart sounds. No murmur heard.    No gallop.  Pulmonary:     Effort: Pulmonary effort is normal. No accessory muscle usage or respiratory distress.     Breath sounds: Normal breath sounds.  Abdominal:     General: Bowel sounds are normal.     Palpations: Abdomen is soft.  Musculoskeletal:     Cervical back: Normal range of motion and neck supple.     Right lower leg: No edema.     Left lower leg: No edema.  Lymphadenopathy:     Cervical: No cervical adenopathy.  Skin:    General: Skin is warm and dry.  Neurological:     Mental Status: She is alert and oriented to person, place, and time.  Psychiatric:        Attention and Perception: Attention normal.        Mood and Affect: Mood normal.        Behavior: Behavior normal. Behavior is cooperative.        Thought Content: Thought content normal.        Judgment: Judgment normal.    Results for orders placed or performed in visit on 09/07/22  Folate  Result Value Ref Range   Folate >20.0 >3.0 ng/mL  Multiple Myeloma Panel (SPEP&IFE w/QIG)  Result Value Ref Range   IgG (Immunoglobin G), Serum 692 586 - 1,602 mg/dL   IgA/Immunoglobulin A, Serum 92 64 - 422 mg/dL   IgM (Immunoglobulin M), Srm 67 26 - 217 mg/dL   Albumin SerPl Elph-Mcnc 4.0 2.9 - 4.4 g/dL   Alpha 1 0.2 0.0 - 0.4 g/dL   Alpha2 Glob SerPl Elph-Mcnc 0.8 0.4 - 1.0 g/dL   B-Globulin SerPl Elph-Mcnc 1.0 0.7 - 1.3 g/dL   Gamma Glob SerPl Elph-Mcnc 0.7 0.4 - 1.8 g/dL   M Protein SerPl Elph-Mcnc Not Observed Not Observed g/dL   Globulin, Total 2.7 2.2 - 3.9 g/dL   Albumin/Glob SerPl 1.5 0.7 - 1.7   IFE 1 Comment    Please Note  Comment   Bayer DCA Hb A1c Waived  Result Value Ref Range   HB A1C (BAYER DCA - WAIVED) 7.1 (H) 4.8 - 5.6 %  Microalbumin, Urine Waived  Result Value Ref Range   Microalb, Ur Waived 10 0 - 19 mg/L   Creatinine, Urine Waived 10 10 - 300 mg/dL   Microalb/Creat Ratio 30-300 (H) <30 mg/g  Comprehensive metabolic panel  Result Value Ref Range   Glucose 127 (H) 70 - 99 mg/dL   BUN 9 8 - 27 mg/dL   Creatinine, Ser 1.61 (L) 0.57 - 1.00 mg/dL   eGFR 96 >09 UE/AVW/0.98   BUN/Creatinine Ratio 16 12 - 28   Sodium 140 134 - 144 mmol/L   Potassium 4.0 3.5 - 5.2 mmol/L   Chloride 96 96 - 106 mmol/L   CO2 25 20 - 29 mmol/L   Calcium 9.7 8.7 - 10.3 mg/dL   Total Protein 6.7 6.0 - 8.5 g/dL   Albumin 4.5 3.8 - 4.8 g/dL   Globulin, Total 2.2 1.5 - 4.5 g/dL   Albumin/Globulin Ratio 2.0 1.2 - 2.2   Bilirubin Total 0.4 0.0 - 1.2 mg/dL   Alkaline Phosphatase 58 44 - 121 IU/L   AST 42 (H) 0 - 40 IU/L   ALT  42 (H) 0 - 32 IU/L  Lipid Panel w/o Chol/HDL Ratio  Result Value Ref Range   Cholesterol, Total 81 (L) 100 - 199 mg/dL   Triglycerides 161 (H) 0 - 149 mg/dL   HDL 38 (L) >09 mg/dL   VLDL Cholesterol Cal 32 5 - 40 mg/dL   LDL Chol Calc (NIH) 11 0 - 99 mg/dL  Vitamin U04  Result Value Ref Range   Vitamin B-12 694 232 - 1,245 pg/mL  Iron Binding Cap (TIBC)([facility])  Result Value Ref Range   Total Iron Binding Capacity 430 250 - 450 ug/dL   UIBC 540 (H) 981 - 191 ug/dL   Iron 54 27 - 478 ug/dL   Iron Saturation 13 (L) 15 - 55 %  Ferritin  Result Value Ref Range   Ferritin 33 15 - 150 ng/mL      Assessment & Plan:   Problem List Items Addressed This Visit       Cardiovascular and Mediastinum   Hypertension associated with diabetes (HCC)    Chronic, stable.  BP at goal for age today.  Continue Valsartan-HCTZ 80 MG-12.5 MG daily and Metoprolol XL 50 MG daily due to tachycardia (thyroid related).  LABS: CMP.  Urine ALB 26 August 2022. Could consider discontinuation of HCTZ at  upcoming visits, as BP well controlled and history of gout.  DASH diet focus at home and monitor BPs, bring levels to visit.  Return in 3 months.      Relevant Orders   Bayer DCA Hb A1c Waived   Comprehensive metabolic panel   Aortic atherosclerosis (HCC)    Ongoing.  Noted on CT 09/18/19.  Continue to monitor and continue ASA + Lopid for prevention.        Respiratory   Obstructive sleep apnea    Chronic, stable.  Continue nightly use, praised for this.          Endocrine   Hyperlipidemia associated with type 2 diabetes mellitus (HCC)    Chronic, stable.  Tolerating Crestor, will continue this and plan on recheck of lipid panel today.  Adjust dosing as needed.      Relevant Orders   Bayer DCA Hb A1c Waived   Comprehensive metabolic panel   Lipid Panel w/o Chol/HDL Ratio   Hyperthyroidism    Ongoing.  Continue collaboration with endocrinology.  At this time continue Metoprolol XL 50 MG daily due to mild tachycardia with her underlying hyperthyroid and continue Armour as prescribed by endo.  Return in 3 months.  Check on new endo referral as has not heard from anyone.      Relevant Orders   T4, free   TSH   Multiple thyroid nodules    Refer to hyperthyroid plan of care.      Type 2 diabetes mellitus with diabetic polyneuropathy, without long-term current use of insulin (HCC) - Primary    Chronic, ongoing.  A1c 7.2% today, slight trend up due to diet. Urine ALB 10 (March 2024).  Will continue Metformin XR 1000 MG BID and discussed with her may need to adjust regimen in future.  Focus on diabetic diet at home and regular exercise.  Continue Gabapentin daily for neuropathy, 900 MG at HS and 600 MG in morning + 600 MG at evening time + Duloxetine 60 MG daily.  Current CrCl = 147 based on recent labs.   Recommend she monitor feet closely for any wounds or skin breakdown, alert provider immediately if present.  Continue collaboration with neurology.  Return in 3 months.  May need to  consider pain management or Lyrica. - Eye exam and foot exam up to date - Vaccines up to date - Statin and ARB on board      Relevant Medications   DULoxetine (CYMBALTA) 60 MG capsule   Other Relevant Orders   Bayer DCA Hb A1c Waived   Comprehensive metabolic panel     Other   B12 deficiency    Ongoing.  Continue daily supplement and recheck level today.  She is on chronic Metformin, which can cause lower B12 levels.  Educated her on this.  May consider monthly injections for a bit if level not improved with oral supplement.      Relevant Orders   Vitamin B12   Morbid obesity (HCC)    BMI 37.60.  Recommended eating smaller high protein, low fat meals more frequently and exercising 30 mins a day 5 times a week with a goal of 10-15lb weight loss in the next 3 months. Patient voiced their understanding and motivation to adhere to these recommendations.       Vitamin D deficiency    Chronic, stable.  Continue daily supplement and check Vit D level today.  DEXA in 2026 next.      Relevant Orders   VITAMIN D 25 Hydroxy (Vit-D Deficiency, Fractures)   Other Visit Diagnoses     Need for Td vaccine       Td vaccine in office today and educated patient on this.   Relevant Orders   Td vaccine greater than or equal to 7yo preservative free IM (Completed)        Follow up plan: Return in about 3 months (around 03/12/2023) for T2DM, HTN/HLD, OSTEOPENIA, HYPERTHYROID, MOOD.

## 2022-12-10 NOTE — Assessment & Plan Note (Signed)
Chronic, stable.  BP at goal for age today.  Continue Valsartan-HCTZ 80 MG-12.5 MG daily and Metoprolol XL 50 MG daily due to tachycardia (thyroid related).  LABS: CMP.  Urine ALB 26 August 2022. Could consider discontinuation of HCTZ at upcoming visits, as BP well controlled and history of gout.  DASH diet focus at home and monitor BPs, bring levels to visit.  Return in 3 months.

## 2022-12-10 NOTE — Assessment & Plan Note (Signed)
Chronic, stable.  Continue daily supplement and check Vit D level today.  DEXA in 2026 next 

## 2022-12-10 NOTE — Assessment & Plan Note (Signed)
BMI 37.60.  Recommended eating smaller high protein, low fat meals more frequently and exercising 30 mins a day 5 times a week with a goal of 10-15lb weight loss in the next 3 months. Patient voiced their understanding and motivation to adhere to these recommendations.  

## 2022-12-10 NOTE — Assessment & Plan Note (Signed)
Ongoing.  Continue daily supplement and recheck level today.  She is on chronic Metformin, which can cause lower B12 levels.  Educated her on this.  May consider monthly injections for a bit if level not improved with oral supplement. 

## 2022-12-10 NOTE — Assessment & Plan Note (Signed)
Ongoing.  Continue collaboration with endocrinology.  At this time continue Metoprolol XL 50 MG daily due to mild tachycardia with her underlying hyperthyroid and continue Armour as prescribed by endo.  Return in 3 months.  Check on new endo referral as has not heard from anyone.

## 2022-12-10 NOTE — Assessment & Plan Note (Signed)
Refer to hyperthyroid plan of care. 

## 2022-12-10 NOTE — Assessment & Plan Note (Signed)
Chronic, stable.  Tolerating Crestor, will continue this and plan on recheck of lipid panel today.  Adjust dosing as needed. 

## 2022-12-10 NOTE — Assessment & Plan Note (Signed)
Chronic, ongoing.  A1c 7.2% today, slight trend up due to diet. Urine ALB 10 (March 2024).  Will continue Metformin XR 1000 MG BID and discussed with her may need to adjust regimen in future.  Focus on diabetic diet at home and regular exercise.  Continue Gabapentin daily for neuropathy, 900 MG at HS and 600 MG in morning + 600 MG at evening time + Duloxetine 60 MG daily.  Current CrCl = 147 based on recent labs.   Recommend she monitor feet closely for any wounds or skin breakdown, alert provider immediately if present.  Continue collaboration with neurology.  Return in 3 months.  May need to consider pain management or Lyrica. - Eye exam and foot exam up to date - Vaccines up to date - Statin and ARB on board

## 2022-12-10 NOTE — Assessment & Plan Note (Signed)
Chronic, stable.  Continue nightly use, praised for this.  

## 2022-12-11 ENCOUNTER — Encounter: Payer: Self-pay | Admitting: Nurse Practitioner

## 2022-12-11 LAB — COMPREHENSIVE METABOLIC PANEL
ALT: 27 IU/L (ref 0–32)
AST: 30 IU/L (ref 0–40)
Albumin: 4.3 g/dL (ref 3.8–4.8)
Alkaline Phosphatase: 58 IU/L (ref 44–121)
BUN/Creatinine Ratio: 16 (ref 12–28)
BUN: 10 mg/dL (ref 8–27)
Bilirubin Total: 0.4 mg/dL (ref 0.0–1.2)
CO2: 23 mmol/L (ref 20–29)
Calcium: 9.9 mg/dL (ref 8.7–10.3)
Chloride: 100 mmol/L (ref 96–106)
Creatinine, Ser: 0.63 mg/dL (ref 0.57–1.00)
Globulin, Total: 2.1 g/dL (ref 1.5–4.5)
Glucose: 195 mg/dL — ABNORMAL HIGH (ref 70–99)
Potassium: 4.2 mmol/L (ref 3.5–5.2)
Sodium: 142 mmol/L (ref 134–144)
Total Protein: 6.4 g/dL (ref 6.0–8.5)
eGFR: 93 mL/min/{1.73_m2} (ref >59)

## 2022-12-11 LAB — LIPID PANEL W/O CHOL/HDL RATIO
Cholesterol, Total: 72 mg/dL — ABNORMAL LOW (ref 100–199)
HDL: 40 mg/dL (ref >39)
LDL Chol Calc (NIH): 8 mg/dL (ref 0–99)
Triglycerides: 145 mg/dL (ref 0–149)
VLDL Cholesterol Cal: 24 mg/dL (ref 5–40)

## 2022-12-11 LAB — T4, FREE: Free T4: 1.14 ng/dL (ref 0.82–1.77)

## 2022-12-11 LAB — VITAMIN D 25 HYDROXY (VIT D DEFICIENCY, FRACTURES): Vit D, 25-Hydroxy: 60.5 ng/mL (ref 30.0–100.0)

## 2022-12-11 LAB — TSH: TSH: 0.02 u[IU]/mL — ABNORMAL LOW (ref 0.450–4.500)

## 2022-12-11 LAB — VITAMIN B12: Vitamin B-12: 639 pg/mL (ref 232–1245)

## 2022-12-11 NOTE — Progress Notes (Signed)
Contacted via MyChart -- I wrote message yesterday, can you check on her endo referral for [facility].  Dr. Ocie Doyne is retiring and she needs new local endo.   Good morning [place], your labs have returned: - Kidney function, creatinine and eGFR, remains normal, as is liver function, AST and ALT.  - Cholesterol levels are very tightly controlled -- we will continue to monitor. - Thyroid labs remain in hyperthyroid range and I have staff checking on the referral for new endocrinology provider. - Vitamin D and B12 levels normal.  Any questions? Keep being stellar!!  Thank you for allowing me to participate in your care.  I appreciate you. Kindest regards, Elleni Mozingo

## 2023-01-04 ENCOUNTER — Encounter: Payer: Self-pay | Admitting: Pharmacist

## 2023-01-04 NOTE — Progress Notes (Signed)
Patient previously followed by [facility] pharmacist. Per clinical review, no pharmacist appointment needed at this time.

## 2023-02-13 ENCOUNTER — Other Ambulatory Visit: Payer: Self-pay | Admitting: Nurse Practitioner

## 2023-02-14 NOTE — Telephone Encounter (Signed)
Requested Prescriptions  Pending Prescriptions Disp Refills   valsartan-hydrochlorothiazide (DIOVAN-HCT) 80-12.5 MG tablet [Pharmacy Med Name: VALSARTAN/HYDROCHLOROTHIAZIDE 80-12.5 MG Tablet] 90 tablet 0    Sig: TAKE 1 TABLET EVERY DAY     Cardiovascular: ARB + Diuretic Combos Passed - 02/13/2023  4:16 AM      Passed - K in normal range and within 180 days    Potassium  Date Value Ref Range Status  12/10/2022 4.2 3.5 - 5.2 mmol/L Final         Passed - Na in normal range and within 180 days    Sodium  Date Value Ref Range Status  12/10/2022 142 134 - 144 mmol/L Final         Passed - Cr in normal range and within 180 days    Creatinine, Ser  Date Value Ref Range Status  12/10/2022 0.63 0.57 - 1.00 mg/dL Final         Passed - eGFR is 10 or above and within 180 days    GFR calc Af Amer  Date Value Ref Range Status  07/12/2020 105 >59 mL/min/1.73 Final    Comment:    **In accordance with recommendations from the NKF-ASN Task force,**   [facility] is in the process of updating its eGFR calculation to the   2021 CKD-EPI creatinine equation that estimates kidney function   without a race variable.    GFR calc non Af Amer  Date Value Ref Range Status  07/12/2020 91 >59 mL/min/1.73 Final   eGFR  Date Value Ref Range Status  12/10/2022 93 >59 mL/min/1.73 Final         Passed - Patient is not pregnant      Passed - Last BP in normal range    BP Readings from Last 1 Encounters:  12/10/22 116/71         Passed - Valid encounter within last 6 months    Recent Outpatient Visits           2 months ago Type 2 diabetes mellitus with diabetic polyneuropathy, without long-term current use of insulin (HCC)   [place] [facility] [place], [place] T, NP   5 months ago Type 2 diabetes mellitus with diabetic polyneuropathy, without long-term current use of insulin (HCC)   [place] [facility] [place], [place] T, NP   8 months ago Type 2 diabetes mellitus  with diabetic mononeuropathy, without long-term current use of insulin (HCC)   [place] [facility] [place], [place] T, NP   9 months ago Hyperthyroidism   [place] [facility] [place], [place] T, NP   10 months ago Hypertension associated with diabetes [facility])   [place] [facility] [place], Dorie Rank, NP       Future Appointments             In 3 weeks [place], Dorie Rank, NP [facility], [facility]

## 2023-03-01 ENCOUNTER — Other Ambulatory Visit: Payer: Self-pay

## 2023-03-01 MED ORDER — ROSUVASTATIN CALCIUM 20 MG PO TABS: 20.0000 mg | ORAL_TABLET | Freq: Every day | ORAL | 4 refills | Status: DC

## 2023-03-04 ENCOUNTER — Other Ambulatory Visit: Payer: Self-pay

## 2023-03-04 MED ORDER — ALLOPURINOL 300 MG PO TABS: 300.0000 mg | ORAL_TABLET | Freq: Every day | ORAL | 4 refills | Status: DC

## 2023-03-06 ENCOUNTER — Other Ambulatory Visit: Payer: Self-pay | Admitting: Nurse Practitioner

## 2023-03-07 NOTE — Telephone Encounter (Signed)
Requested Prescriptions  Pending Prescriptions Disp Refills   TRUE METRIX BLOOD GLUCOSE TEST test strip [Pharmacy Med Name: True Metrix Blood Glucose Test In Vitro Strip] 300 strip 3    Sig: TEST BLOOD SUGAR 2 TO 3 TIMES DAILY AND DOCUMENT. BRING LOG TO VISITS.     Endocrinology: Diabetes - Testing Supplies Passed - 03/06/2023  6:28 PM      Passed - Valid encounter within last 12 months    Recent Outpatient Visits           2 months ago Type 2 diabetes mellitus with diabetic polyneuropathy, without long-term current use of insulin (HCC)   [facility] [place], [place] T, NP   6 months ago Type 2 diabetes mellitus with diabetic polyneuropathy, without long-term current use of insulin (HCC)   [place] [facility] [place], [place] T, NP   9 months ago Type 2 diabetes mellitus with diabetic mononeuropathy, without long-term current use of insulin (HCC)   [place] [facility] [place], [place] T, NP   10 months ago Hyperthyroidism   [place] [facility] [place], [place] T, NP   11 months ago Hypertension associated with diabetes [facility])   [place] [facility] [place], Dorie Rank, NP       Future Appointments             In 5 days Marjie Skiff, NP  [facility], [facility]

## 2023-03-09 NOTE — Patient Instructions (Signed)
 Be Involved in Caring For Your Health:  Taking Medications When medications are taken as directed, they can greatly improve your health. But if they are not taken as prescribed, they may not work. In some cases, not taking them correctly can be harmful. To help ensure your treatment remains effective and safe, understand your medications and how to take them. Bring your medications to each visit for review by your provider.  Your lab results, notes, and after visit summary will be available on My Chart. We strongly encourage you to use this feature. If lab results are abnormal the clinic will contact you with the appropriate steps. If the clinic does not contact you assume the results are satisfactory. You can always view your results on My Chart. If you have questions regarding your health or results, please contact the clinic during office hours. You can also ask questions on My Chart.  We at [facility] are grateful that you chose [place] to provide your care. We strive to provide evidence-based and compassionate care and are always looking for feedback. If you get a survey from the clinic please complete this so we can hear your opinions.  Diabetes Mellitus and Nutrition, Adult When you have diabetes, or diabetes mellitus, it is very important to have healthy eating habits because your blood sugar (glucose) levels are greatly affected by what you eat and drink. Eating healthy foods in the right amounts, at about the same times every day, can help you: Manage your blood glucose. Lower your risk of heart disease. Improve your blood pressure. Reach or maintain a healthy weight. What can affect my meal plan? Every person with diabetes is different, and each person has different needs for a meal plan. Your health care provider may recommend that you work with a dietitian to make a meal plan that is best for you. Your meal plan may vary depending on factors such as: The calories you need. The  medicines you take. Your weight. Your blood glucose, blood pressure, and cholesterol levels. Your activity level. Other health conditions you have, such as heart or kidney disease. How do carbohydrates affect me? Carbohydrates, also called carbs, affect your blood glucose level more than any other type of food. Eating carbs raises the amount of glucose in your blood. It is important to know how many carbs you can safely have in each meal. This is different for every person. Your dietitian can help you calculate how many carbs you should have at each meal and for each snack. How does alcohol affect me? Alcohol can cause a decrease in blood glucose (hypoglycemia), especially if you use insulin or take certain diabetes medicines by mouth. Hypoglycemia can be a life-threatening condition. Symptoms of hypoglycemia, such as sleepiness, dizziness, and confusion, are similar to symptoms of having too much alcohol. Do not drink alcohol if: Your health care provider tells you not to drink. You are pregnant, may be pregnant, or are planning to become pregnant. If you drink alcohol: Limit how much you have to: 0-1 drink a day for women. 0-2 drinks a day for men. Know how much alcohol is in your drink. In the [place] one drink equals one 12 oz bottle of beer (355 mL), one 5 oz glass of wine (148 mL), or one 1 oz glass of hard liquor (44 mL). Keep yourself hydrated with water, diet soda, or unsweetened iced tea. Keep in mind that regular soda, juice, and other mixers may contain a lot of sugar and must  be counted as carbs. What are tips for following this plan?  Reading food labels Start by checking the serving size on the Nutrition Facts label of packaged foods and drinks. The number of calories and the amount of carbs, fats, and other nutrients listed on the label are based on one serving of the item. Many items contain more than one serving per package. Check the total grams (g) of carbs in one  serving. Check the number of grams of saturated fats and trans fats in one serving. Choose foods that have a low amount or none of these fats. Check the number of milligrams (mg) of salt (sodium) in one serving. Most people should limit total sodium intake to less than 2,300 mg per day. Always check the nutrition information of foods labeled as "low-fat" or "nonfat." These foods may be higher in added sugar or refined carbs and should be avoided. Talk to your dietitian to identify your daily goals for nutrients listed on the label. Shopping Avoid buying canned, pre-made, or processed foods. These foods tend to be high in fat, sodium, and added sugar. Shop around the outside edge of the grocery store. This is where you will most often find fresh fruits and vegetables, bulk grains, fresh meats, and fresh dairy products. Cooking Use low-heat cooking methods, such as baking, instead of high-heat cooking methods, such as deep frying. Cook using healthy oils, such as olive, canola, or sunflower oil. Avoid cooking with butter, cream, or high-fat meats. Meal planning Eat meals and snacks regularly, preferably at the same times every day. Avoid going long periods of time without eating. Eat foods that are high in fiber, such as fresh fruits, vegetables, beans, and whole grains. Eat 4-6 oz (112-168 g) of lean protein each day, such as lean meat, chicken, fish, eggs, or tofu. One ounce (oz) (28 g) of lean protein is equal to: 1 oz (28 g) of meat, chicken, or fish. 1 egg.  cup (62 g) of tofu. Eat some foods each day that contain healthy fats, such as avocado, nuts, seeds, and fish. What foods should I eat? Fruits Berries. Apples. Oranges. Peaches. Apricots. Plums. Grapes. Mangoes. Papayas. Pomegranates. Kiwi. Cherries. Vegetables Leafy greens, including lettuce, spinach, kale, chard, collard greens, mustard greens, and cabbage. Beets. Cauliflower. Broccoli. Carrots. Green beans. Tomatoes. Peppers.  Onions. Cucumbers. Brussels sprouts. Grains Whole grains, such as whole-wheat or whole-grain bread, crackers, tortillas, cereal, and pasta. Unsweetened oatmeal. Quinoa. Brown or wild rice. Meats and other proteins Seafood. Poultry without skin. Lean cuts of poultry and beef. Tofu. Nuts. Seeds. Dairy Low-fat or fat-free dairy products such as milk, yogurt, and cheese. The items listed above may not be a complete list of foods and beverages you can eat and drink. Contact a dietitian for more information. What foods should I avoid? Fruits Fruits canned with syrup. Vegetables Canned vegetables. Frozen vegetables with butter or cream sauce. Grains Refined white flour and flour products such as bread, pasta, snack foods, and cereals. Avoid all processed foods. Meats and other proteins Fatty cuts of meat. Poultry with skin. Breaded or fried meats. Processed meat. Avoid saturated fats. Dairy Full-fat yogurt, cheese, or milk. Beverages Sweetened drinks, such as soda or iced tea. The items listed above may not be a complete list of foods and beverages you should avoid. Contact a dietitian for more information. Questions to ask a health care provider Do I need to meet with a certified diabetes care and education specialist? Do I need to meet with a  dietitian? What number can I call if I have questions? When are the best times to check my blood glucose? Where to find more information: American Diabetes Association: diabetes.org Academy of Nutrition and Dietetics: eatright.[employer] of Diabetes and Digestive and Kidney Diseases: StageSync.si Association of Diabetes Care & Education Specialists: diabeteseducator.org Summary It is important to have healthy eating habits because your blood sugar (glucose) levels are greatly affected by what you eat and drink. It is important to use alcohol carefully. A healthy meal plan will help you manage your blood glucose and lower your risk of  heart disease. Your health care provider may recommend that you work with a dietitian to make a meal plan that is best for you. This information is not intended to replace advice given to you by your health care provider. Make sure you discuss any questions you have with your health care provider. Document Revised: 01/06/2020 Document Reviewed: 01/06/2020 Elsevier Patient Education  2024 [employer].

## 2023-03-12 ENCOUNTER — Encounter: Payer: Self-pay | Admitting: Nurse Practitioner

## 2023-03-12 ENCOUNTER — Ambulatory Visit (INDEPENDENT_AMBULATORY_CARE_PROVIDER_SITE_OTHER): Payer: Medicare HMO | Admitting: Nurse Practitioner

## 2023-03-12 VITALS — BP 113/67 | HR 99 | Temp 98.9°F | Ht 63.0 in | Wt 211.4 lb

## 2023-03-12 DIAGNOSIS — E0590 Thyrotoxicosis, unspecified without thyrotoxic crisis or storm: Secondary | ICD-10-CM

## 2023-03-12 DIAGNOSIS — Z7984 Long term (current) use of oral hypoglycemic drugs: Secondary | ICD-10-CM

## 2023-03-12 DIAGNOSIS — E042 Nontoxic multinodular goiter: Secondary | ICD-10-CM | POA: Diagnosis not present

## 2023-03-12 DIAGNOSIS — M1A3610 Chronic gout due to renal impairment, right knee, without tophus (tophi): Secondary | ICD-10-CM | POA: Diagnosis not present

## 2023-03-12 DIAGNOSIS — I152 Hypertension secondary to endocrine disorders: Secondary | ICD-10-CM

## 2023-03-12 DIAGNOSIS — E785 Hyperlipidemia, unspecified: Secondary | ICD-10-CM

## 2023-03-12 DIAGNOSIS — E1159 Type 2 diabetes mellitus with other circulatory complications: Secondary | ICD-10-CM | POA: Diagnosis not present

## 2023-03-12 DIAGNOSIS — G4733 Obstructive sleep apnea (adult) (pediatric): Secondary | ICD-10-CM

## 2023-03-12 DIAGNOSIS — E1169 Type 2 diabetes mellitus with other specified complication: Secondary | ICD-10-CM

## 2023-03-12 DIAGNOSIS — Z23 Encounter for immunization: Secondary | ICD-10-CM | POA: Diagnosis not present

## 2023-03-12 DIAGNOSIS — E1142 Type 2 diabetes mellitus with diabetic polyneuropathy: Secondary | ICD-10-CM

## 2023-03-12 MED ORDER — METOPROLOL SUCCINATE ER 50 MG PO TB24: 50.0000 mg | ORAL_TABLET | Freq: Every day | ORAL | 4 refills | Status: DC

## 2023-03-12 MED ORDER — METFORMIN HCL ER 500 MG PO TB24: ORAL_TABLET | ORAL | 4 refills | Status: DC

## 2023-03-12 MED ORDER — VALSARTAN-HYDROCHLOROTHIAZIDE 80-12.5 MG PO TABS: 1.0000 | ORAL_TABLET | Freq: Every day | ORAL | 4 refills | Status: DC

## 2023-03-12 NOTE — Assessment & Plan Note (Signed)
Chronic, stable.  Continue nightly use, praised for this.

## 2023-03-12 NOTE — Assessment & Plan Note (Addendum)
Chronic, ongoing.  A1c 7.2% last visit, slight trend up due to diet at the time.  Recheck today. Urine ALB 10 (March 2024).  Will continue Metformin XR 1000 MG BID and discussed with her may need to adjust regimen if ongoing elevations noted.  Focus on diabetic diet at home and regular exercise.  Continue Gabapentin daily for neuropathy, 900 MG at HS and 600 MG in morning + 600 MG at evening time + Duloxetine 60 MG daily.  Current CrCl = 117 based on recent labs.   Recommend she monitor feet closely for any wounds or skin breakdown, alert provider immediately if present.  Continue collaboration with neurology.  Return in 3 months.  May need to consider pain management or Lyrica if worsening pain. - Eye exam and foot exam up to date - Vaccines up to date - Statin and ARB on board

## 2023-03-12 NOTE — Assessment & Plan Note (Signed)
Chronic, stable.  BP well below goal for age today.  Continue Valsartan-HCTZ 80 MG-12.5 MG daily and Metoprolol XL 50 MG daily due to tachycardia (thyroid related).  LABS: up to date.  Urine ALB 26 August 2022. Could consider discontinuation of HCTZ at upcoming visits, as BP well controlled and history of gout.  DASH diet focus at home and monitor BPs, bring levels to visit.  Return in 3 months.

## 2023-03-12 NOTE — Assessment & Plan Note (Signed)
BMI 37.45.  Recommended eating smaller high protein, low fat meals more frequently and exercising 30 mins a day 5 times a week with a goal of 10-15lb weight loss in the next 3 months. Patient voiced their understanding and motivation to adhere to these recommendations. ? ?

## 2023-03-12 NOTE — Assessment & Plan Note (Signed)
Ongoing.  Continue collaboration with endocrinology, is scheduled to see new endocrinologist upcoming at [facility].  At this time continue Metoprolol XL 50 MG daily due to mild tachycardia with her underlying hyperthyroid and continue Armour as prescribed by endo.  Discussed with her that suspect the new endo may adjust medications and stop Armour, start Methimazole.  Return in 3 months.

## 2023-03-12 NOTE — Assessment & Plan Note (Signed)
Chronic, stable.  Tolerating Crestor, will continue this and adjust as needed.  Lipid panel up to date.

## 2023-03-12 NOTE — Assessment & Plan Note (Signed)
Refer to hyperthyroid plan of care.

## 2023-03-12 NOTE — Assessment & Plan Note (Signed)
Chronic, ongoing.  Continue Allopurinol at this time, but may consider reduction in future.  Uric acid ordered today.

## 2023-03-12 NOTE — Progress Notes (Signed)
BP 113/67   Pulse 99   Temp 98.9 F (37.2 C) (Oral)   Ht 5\' 3"  (1.6 m)   Wt 211 lb 6.4 oz (95.9 kg)   LMP  (LMP Unknown)   SpO2 94%   BMI 37.45 kg/m    Subjective:    [name], female    DOB: 06-Oct-1945, 77 y.o.   MRN: [id number]  HPI: Catherine Hinton is a 77 y.o. female  Chief Complaint  Patient presents with   Depression   Diabetes   Hyperlipidemia   Hypertension   Osteopenia   Hyperthyroidism   DIABETES June A1c was 7.2%.  Continues on Metformin 1000 MG BID.  Does endorse food is a coping mechanism.  Taking Gabapentin & Duloxetine for neuropathy which offers benefit.  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   Hypoglycemic episodes:no Polydipsia/polyuria: no Visual disturbance: no Chest pain: no Paresthesias: no Glucose Monitoring: yes             Accucheck frequency: Daily             Fasting glucose: 134 this morning             Post prandial:             Evening:             Before meals: Taking Insulin?: no             Long acting insulin:             Short acting insulin: Blood Pressure Monitoring: daily Retinal Examination: Up to Date -- Dr. Janett Billow Exam: Up to Date Pneumovax: Up to Date Influenza: Up to Date Aspirin: yes    HYPERTENSION / HYPERLIPIDEMIA Taking Metoprolol, Valsartan-HCTZ, ASA and Crestor.  Uses CPAP nightly -- last sleep study 11/14/22, moderates OSA. Satisfied with current treatment? yes Duration of hypertension: chronic BP monitoring frequency: 3-4 times a week BP range: 110-120/70 range BP medication side effects: no Duration of hyperlipidemia: chronic Cholesterol medication side effects: no Cholesterol supplements: none Medication compliance: good compliance Aspirin: yes Recent stressors: no Recurrent headaches: no Visual changes: no Palpitations: no Dyspnea: no Chest pain:  no Lower extremity edema: no Dizzy/lightheaded: no The ASCVD Risk score (Arnett DK, et al., 2019) failed to calculate for the following reasons:   The valid total cholesterol range is 130 to 320 mg/dL  GOUT Continues on Allopurinol, no flare in years.  Only had flare one time. Duration:chronic Swelling: no Redness: no Trauma: no Recent dietary change or indiscretion: no Fevers: no Nausea/vomiting: no Aggravating factors: diet changes Alleviating factors: Allopurinol Status:  stable Treatments attempted: Allopurinol  HYPERTHYROIDISM She saw Dr. Patrecia Pace in past, last visit a couple months back, but he is retired.  She is scheduled to see [facility] endo for new patient visit on 03/18/23.  Continues on thyroid Armour 15 MG.  Taking Metoprolol for HR control.  Does have upper tremor noted.  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 Fatigue: yes Cold intolerance: no Heat intolerance: yes, worse lately Weight gain: no Weight loss: no Constipation: no Diarrhea/loose stools: no Palpitations: no Lower extremity edema: no Anxiety/depressed mood: no    03/12/2023   10:01 AM 12/10/2022   10:46 AM 09/07/2022    1:51 PM 06/06/2022    1:44 PM 04/24/2022    1:41 PM  Depression screen PHQ  2/9  Decreased Interest 0 0 0  1  Down, Depressed, Hopeless 0 0 0 1 1  PHQ - 2 Score 0 0 0 1 2  Altered sleeping 1 1 1 1 2   Tired, decreased energy 1 1 1 1 3   Change in appetite 2 1 1 1 1   Feeling bad or failure about yourself  0 0 0 0 0  Trouble concentrating 0 0 0 0 1  Moving slowly or fidgety/restless 0 0 0 0 0  Suicidal thoughts 0 0 0 0 0  PHQ-9 Score 4 3 3 4 9   Difficult doing work/chores Not difficult at all Not difficult at all Somewhat difficult Not difficult at all Somewhat difficult       03/12/2023   10:02 AM 12/10/2022   10:46 AM 09/07/2022    1:52 PM 06/06/2022    1:44 PM  GAD 7 : Generalized Anxiety Score  Nervous, Anxious, on Edge 0 0  1 1  Control/stop worrying 0 0 0 0  Worry too much - different things 0 0 0 0  Trouble relaxing 0 0 0 0  Restless 0 0 0 0  Easily annoyed or irritable 0 0 0 1  Afraid - awful might happen 0 0 0 0  Total GAD 7 Score 0 0 1 2  Anxiety Difficulty Not difficult at all Not difficult at all Not difficult at all Not difficult at all   Relevant past medical, surgical, family and social history reviewed and updated as indicated. Interim medical history since our last visit reviewed. Allergies and medications reviewed and updated.  Review of Systems  Constitutional:  Negative for activity change, appetite change, diaphoresis, fatigue and fever.  Respiratory:  Negative for cough, chest tightness, shortness of breath and wheezing.   Cardiovascular:  Negative for chest pain, palpitations and leg swelling.  Gastrointestinal: Negative.   Endocrine: Negative for cold intolerance, heat intolerance, polydipsia, polyphagia and polyuria.  Genitourinary: Negative.   Neurological: Negative.   Psychiatric/Behavioral: Negative.      Per HPI unless specifically indicated above     Objective:    BP 113/67   Pulse 99   Temp 98.9 F (37.2 C) (Oral)   Ht 5\' 3"  (1.6 m)   Wt 211 lb 6.4 oz (95.9 kg)   LMP  (LMP Unknown)   SpO2 94%   BMI 37.45 kg/m   Wt Readings from Last 3 Encounters:  03/12/23 211 lb 6.4 oz (95.9 kg)  12/10/22 212 lb 3.2 oz (96.3 kg)  10/18/22 214 lb 12.8 oz (97.4 kg)    Physical Exam Vitals and nursing note reviewed.  Constitutional:      General: She is awake. She is not in acute distress.    Appearance: She is well-developed. She is obese. She is not ill-appearing.  HENT:     Head: Normocephalic.     Right Ear: Hearing, tympanic membrane and ear canal normal. No drainage.     Left Ear: Hearing, tympanic membrane, ear canal and external ear normal. No drainage.     Nose: Nose normal.     Mouth/Throat:     Mouth: Mucous membranes are moist.     Pharynx: Oropharynx is clear.   Eyes:     General: Lids are normal.        Right eye: No discharge.        Left eye: No discharge.     Conjunctiva/sclera: Conjunctivae normal.     Pupils: Pupils are equal, round, and reactive to  light.  Neck:     Vascular: No carotid bruit.  Cardiovascular:     Rate and Rhythm: Normal rate and regular rhythm.     Heart sounds: Normal heart sounds. No murmur heard.    No gallop.  Pulmonary:     Effort: Pulmonary effort is normal. No accessory muscle usage or respiratory distress.     Breath sounds: Normal breath sounds.  Abdominal:     General: Bowel sounds are normal.     Palpations: Abdomen is soft.  Musculoskeletal:     Cervical back: Normal range of motion and neck supple.     Right lower leg: No edema.     Left lower leg: No edema.  Lymphadenopathy:     Cervical: No cervical adenopathy.  Skin:    General: Skin is warm and dry.  Neurological:     Mental Status: She is alert and oriented to person, place, and time.  Psychiatric:        Attention and Perception: Attention normal.        Mood and Affect: Mood normal.        Behavior: Behavior normal. Behavior is cooperative.        Thought Content: Thought content normal.        Judgment: Judgment normal.    Diabetic Foot Exam - Simple   Simple Foot Form Visual Inspection No deformities, no ulcerations, no other skin breakdown bilaterally: Yes Sensation Testing See comments: Yes Pulse Check Posterior Tibialis and Dorsalis pulse intact bilaterally: Yes Comments Sensation reduced bilateral feet -- left 4/10 and right 5/10.     Results for orders placed or performed in visit on 12/11/22  HM DIABETES EYE EXAM  Result Value Ref Range   HM Diabetic Eye Exam No Retinopathy No Retinopathy      Assessment & Plan:   Problem List Items Addressed This Visit       Cardiovascular and Mediastinum   Hypertension associated with diabetes (HCC)    Chronic, stable.  BP well below goal for age today.  Continue  Valsartan-HCTZ 80 MG-12.5 MG daily and Metoprolol XL 50 MG daily due to tachycardia (thyroid related).  LABS: up to date.  Urine ALB 26 August 2022. Could consider discontinuation of HCTZ at upcoming visits, as BP well controlled and history of gout.  DASH diet focus at home and monitor BPs, bring levels to visit.  Return in 3 months.      Relevant Medications   valsartan-hydrochlorothiazide (DIOVAN-HCT) 80-12.5 MG tablet   metoprolol succinate (TOPROL-XL) 50 MG 24 hr tablet   metFORMIN (GLUCOPHAGE-XR) 500 MG 24 hr tablet   Other Relevant Orders   HgB A1c     Respiratory   Obstructive sleep apnea    Chronic, stable.  Continue nightly use, praised for this.          Endocrine   Hyperlipidemia associated with type 2 diabetes mellitus (HCC)    Chronic, stable.  Tolerating Crestor, will continue this and adjust as needed.  Lipid panel up to date.      Relevant Medications   valsartan-hydrochlorothiazide (DIOVAN-HCT) 80-12.5 MG tablet   metoprolol succinate (TOPROL-XL) 50 MG 24 hr tablet   metFORMIN (GLUCOPHAGE-XR) 500 MG 24 hr tablet   Other Relevant Orders   HgB A1c   Hyperthyroidism    Ongoing.  Continue collaboration with endocrinology, is scheduled to see new endocrinologist upcoming at [facility].  At this time continue Metoprolol XL 50 MG daily due to mild tachycardia with her  underlying hyperthyroid and continue Armour as prescribed by endo.  Discussed with her that suspect the new endo may adjust medications and stop Armour, start Methimazole.  Return in 3 months.        Relevant Medications   metoprolol succinate (TOPROL-XL) 50 MG 24 hr tablet   Multiple thyroid nodules    Refer to hyperthyroid plan of care.      Relevant Medications   metoprolol succinate (TOPROL-XL) 50 MG 24 hr tablet   Type 2 diabetes mellitus with diabetic polyneuropathy, without long-term current use of insulin (HCC) - Primary    Chronic, ongoing.  A1c 7.2% last visit, slight trend up due to diet  at the time.  Recheck today. Urine ALB 10 (March 2024).  Will continue Metformin XR 1000 MG BID and discussed with her may need to adjust regimen if ongoing elevations noted.  Focus on diabetic diet at home and regular exercise.  Continue Gabapentin daily for neuropathy, 900 MG at HS and 600 MG in morning + 600 MG at evening time + Duloxetine 60 MG daily.  Current CrCl = 117 based on recent labs.   Recommend she monitor feet closely for any wounds or skin breakdown, alert provider immediately if present.  Continue collaboration with neurology.  Return in 3 months.  May need to consider pain management or Lyrica if worsening pain. - Eye exam and foot exam up to date - Vaccines up to date - Statin and ARB on board      Relevant Medications   valsartan-hydrochlorothiazide (DIOVAN-HCT) 80-12.5 MG tablet   metFORMIN (GLUCOPHAGE-XR) 500 MG 24 hr tablet   Other Relevant Orders   HgB A1c     Other   Gout    Chronic, ongoing.  Continue Allopurinol at this time, but may consider reduction in future.  Uric acid ordered today.      Relevant Orders   Uric acid   Morbid obesity (HCC)    BMI 37.45.  Recommended eating smaller high protein, low fat meals more frequently and exercising 30 mins a day 5 times a week with a goal of 10-15lb weight loss in the next 3 months. Patient voiced their understanding and motivation to adhere to these recommendations.       Relevant Medications   metFORMIN (GLUCOPHAGE-XR) 500 MG 24 hr tablet   Other Visit Diagnoses     Flu vaccine need       Flu vaccine provided today, educated on this.   Relevant Orders   Flu Vaccine Trivalent High Dose (Fluad) (Completed)        Follow up plan: Return in about 3 months (around 06/11/2023) for T2DM, HTN/HLD, OSTEOPENIA, THYROID.

## 2023-03-13 LAB — HEMOGLOBIN A1C
Est. average glucose Bld gHb Est-mCnc: 151 mg/dL
Hgb A1c MFr Bld: 6.9 % — ABNORMAL HIGH (ref 4.8–5.6)

## 2023-03-13 LAB — URIC ACID: Uric Acid: 4 mg/dL (ref 3.1–7.9)

## 2023-03-13 NOTE — Progress Notes (Signed)
Contacted via MyChart   Good news, your A1c is below 7%!!!  It is 6.9% today, coming down from 7.2%.  Great job.  Uric acid level is stable.  Any questions?  Let me know how endo visit goes.:) Keep being amazing!!  Thank you for allowing me to participate in your care.  I appreciate you. Kindest regards, Catherine Hinton

## 2023-03-18 DIAGNOSIS — E042 Nontoxic multinodular goiter: Secondary | ICD-10-CM | POA: Diagnosis not present

## 2023-03-18 DIAGNOSIS — E0580 Other thyrotoxicosis without thyrotoxic crisis or storm: Secondary | ICD-10-CM | POA: Diagnosis not present

## 2023-03-29 DIAGNOSIS — H04123 Dry eye syndrome of bilateral lacrimal glands: Secondary | ICD-10-CM | POA: Diagnosis not present

## 2023-03-29 DIAGNOSIS — H02883 Meibomian gland dysfunction of right eye, unspecified eyelid: Secondary | ICD-10-CM | POA: Diagnosis not present

## 2023-03-29 DIAGNOSIS — H40013 Open angle with borderline findings, low risk, bilateral: Secondary | ICD-10-CM | POA: Diagnosis not present

## 2023-03-29 DIAGNOSIS — H353131 Nonexudative age-related macular degeneration, bilateral, early dry stage: Secondary | ICD-10-CM | POA: Diagnosis not present

## 2023-03-29 DIAGNOSIS — H02886 Meibomian gland dysfunction of left eye, unspecified eyelid: Secondary | ICD-10-CM | POA: Diagnosis not present

## 2023-03-29 DIAGNOSIS — H26492 Other secondary cataract, left eye: Secondary | ICD-10-CM | POA: Diagnosis not present

## 2023-03-29 LAB — HM DIABETES EYE EXAM

## 2023-04-01 ENCOUNTER — Encounter: Payer: Self-pay | Admitting: Nurse Practitioner

## 2023-04-11 DIAGNOSIS — E042 Nontoxic multinodular goiter: Secondary | ICD-10-CM | POA: Diagnosis not present

## 2023-04-23 ENCOUNTER — Ambulatory Visit (INDEPENDENT_AMBULATORY_CARE_PROVIDER_SITE_OTHER): Payer: Medicare HMO | Admitting: Emergency Medicine

## 2023-04-23 VITALS — BP 115/70 | HR 93 | Ht 63.0 in | Wt 212.2 lb

## 2023-04-23 DIAGNOSIS — Z0000 Encounter for general adult medical examination without abnormal findings: Secondary | ICD-10-CM | POA: Diagnosis not present

## 2023-04-23 DIAGNOSIS — Z1231 Encounter for screening mammogram for malignant neoplasm of breast: Secondary | ICD-10-CM

## 2023-04-23 NOTE — Progress Notes (Signed)
Subjective:   Catherine Hinton is a 77 y.o. female who presents for Medicare Annual (Subsequent) preventive examination.  Visit Complete: In person  Patient Medicare AWV questionnaire was completed by the patient on 04/22/23; I have confirmed that all information answered by patient is correct and no changes since this date.  Cardiac Risk Factors include: advanced age (>72men, >80 women);obesity (BMI >30kg/m2);diabetes mellitus;hypertension;dyslipidemia     Objective:    Today's Vitals   04/23/23 0901  BP: 115/70  Pulse: 93  Weight: 212 lb 3.2 oz (96.3 kg)  Height: 5\' 3"  (1.6 m)   Body mass index is 37.59 kg/m.     04/23/2023    9:11 AM 04/17/2021    3:16 PM 02/01/2020    7:10 AM 12/25/2019   10:35 AM 04/10/2018   10:50 AM 04/04/2017   10:17 AM 04/25/2016   10:08 AM  Advanced Directives  Does Patient Have a Medical Advance Directive? Yes No No No Yes Yes Yes  Type of Estate agent of [place];Living will    Healthcare Power of [place];Living will Healthcare Power of [address];Living will   Does patient want to make changes to medical advance directive? No - Patient declined    --    Copy of Healthcare Power of Attorney in Chart? No - copy requested     No - copy requested   Would patient like information on creating a medical advance directive?   No - Patient declined No - Patient declined       Current Medications (verified) Outpatient Encounter Medications as of 04/23/2023  Medication Sig   allopurinol (ZYLOPRIM) 300 MG tablet Take 1 tablet (300 mg total) by mouth daily.   aspirin 81 MG EC tablet Take 81 mg by mouth daily.    Blood Glucose Monitoring Suppl (TRUE METRIX AIR GLUCOSE METER) w/Device KIT Use to check blood sugar 2-3 times a day and document.  Bring log to visits.   Cholecalciferol 25 MCG (1000 UT) tablet Take 2,000 Units by mouth daily.    DHA-EPA-Flaxseed Oil-Vitamin E (THERA TEARS NUTRITION PO) Take 1 mg by mouth 2 (two) times daily.     DULoxetine (CYMBALTA) 60 MG capsule Take 1 capsule (60 mg total) by mouth daily.   gabapentin (NEURONTIN) 300 MG capsule TAKE 2 CAPSULES EVERY MORNING, AND THEN 2 CAPSULES AT 2 PM, AND THEN TAKE 3 CAPSULES EVERY NIGHT BEFORE BEDTIME.   metFORMIN (GLUCOPHAGE-XR) 500 MG 24 hr tablet TAKE 2 TABLETS EVERY MORNING  AND TAKE 2 TABLETS AT BEDTIME   metoprolol succinate (TOPROL-XL) 50 MG 24 hr tablet Take 1 tablet (50 mg total) by mouth daily. Take with or immediately following a meal.   Multiple Vitamins-Minerals (EYE VITAMINS PO) Take 1 tablet by mouth daily.   rosuvastatin (CRESTOR) 20 MG tablet Take 1 tablet (20 mg total) by mouth daily.   TRUE METRIX BLOOD GLUCOSE TEST test strip TEST BLOOD SUGAR 2 TO 3 TIMES DAILY AND DOCUMENT. BRING LOG TO VISITS.   TRUEplus Lancets 33G MISC Use to check blood sugar 2 to 3 times a day and document. Bring log to visits.   valsartan-hydrochlorothiazide (DIOVAN-HCT) 80-12.5 MG tablet Take 1 tablet by mouth daily.   vitamin B-12 (CYANOCOBALAMIN) 1000 MCG tablet Take 1,000 mcg by mouth daily.   XIIDRA 5 % SOLN Place 1 drop into both eyes 2 (two) times daily.    NP THYROID 15 MG tablet Take 15 mg by mouth every morning. (Patient not taking: Reported on 04/23/2023)  No facility-administered encounter medications on file as of 04/23/2023.    Allergies (verified) Niacin and Sulfa antibiotics   History: Past Medical History:  Diagnosis Date   Chronic kidney disease 04/25/2015   Diabetes mellitus with renal manifestation (HCC)    Diabetes mellitus without complication (HCC)    Glaucoma    Gout    Hyperlipidemia    Hypertension    Hypertensive CKD (chronic kidney disease)    Neuropathy    feet   Obesity    Sleep apnea    CPAP   Past Surgical History:  Procedure Laterality Date   ABDOMINAL HYSTERECTOMY     BREAST CYST ASPIRATION     CHOLECYSTECTOMY     COLONOSCOPY WITH PROPOFOL N/A 02/01/2020   Procedure: COLONOSCOPY WITH PROPOFOL;  Surgeon: [name],  MD;  Location: [facility];  Service: Endoscopy;  Laterality: N/A;  Diabetic - oral meds sleep apnea priority 4   HERNIA REPAIR     THUMB FUSION     Family History  Problem Relation Age of Onset   Diabetes Mother    Cancer Mother        Pituitary tumor   Hyperlipidemia Mother    Hypertension Mother    Thyroid disease Mother    Stroke Mother    Cancer Father        Lung   Cancer Brother        Oral   Cancer Maternal Grandmother        colon   Blindness Maternal Grandfather    Arthritis Paternal Grandmother    Sleep apnea Daughter    Arthritis Daughter        RA   Sleep apnea Daughter    Hypertension Son    Breast cancer Neg Hx    Social History   Socioeconomic History   Marital status: Divorced    Spouse name: Not on file   Number of children: 3   Years of education: Not on file   Highest education level: Associate degree: occupational, Scientist, product/process development, or vocational program  Occupational History   Occupation: retired  Tobacco Use   Smoking status: Never   Smokeless tobacco: Never  Vaping Use   Vaping status: Never Used  Substance and Sexual Activity   Alcohol use: No    Alcohol/week: 0.0 standard drinks of alcohol   Drug use: No   Sexual activity: Not Currently  Other Topics Concern   Not on file  Social History Narrative   Not on file   Social Determinants of Health   Financial Resource Strain: Low Risk  (04/22/2023)   Overall Financial Resource Strain (CARDIA)    Difficulty of Paying Living Expenses: Not very hard  Food Insecurity: No Food Insecurity (04/22/2023)   Hunger Vital Sign    Worried About Running Out of Food in the Last Year: Never true    Ran Out of Food in the Last Year: Never true  Transportation Needs: No Transportation Needs (04/22/2023)   PRAPARE - Administrator, Civil Service (Medical): No    Lack of Transportation (Non-Medical): No  Physical Activity: Insufficiently Active (04/22/2023)   Exercise Vital Sign    Days  of Exercise per Week: 1 day    Minutes of Exercise per Session: 10 min  Stress: No Stress Concern Present (04/22/2023)   [employer] of Occupational Health - Occupational Stress Questionnaire    Feeling of Stress : Only a little  Social Connections: Socially Isolated (04/22/2023)   Social Connection  and Isolation Panel [NHANES]    Frequency of Communication with Friends and Family: Once a week    Frequency of Social Gatherings with Friends and Family: Once a week    Attends Religious Services: More than 4 times per year    Active Member of [employer] or Organizations: No    Attends Engineer, structural: Never    Marital Status: Divorced    Tobacco Counseling Counseling given: Not Answered   Clinical Intake:  Pre-visit preparation completed: Yes  Pain : No/denies pain     BMI - recorded: 37.59 Nutritional Status: BMI > 30  Obese Nutritional Risks: None Diabetes: Yes CBG done?: No (FBS 127 per patient) Did pt. bring in CBG monitor from home?: No  How often do you need to have someone help you when you read instructions, pamphlets, or other written materials from your doctor or pharmacy?: 1 - Never  Interpreter Needed?: No  Information entered by :: Tora Kindred, CMA   Activities of Daily Living    04/22/2023    9:37 AM  In your present state of health, do you have any difficulty performing the following activities:  Hearing? 1  Comment "maybe a little, not really"  Vision? 1  Comment due to dry eyes  Difficulty concentrating or making decisions? 0  Walking or climbing stairs? 1  Comment due to diabetic neuropathy  Dressing or bathing? 0  Doing errands, shopping? 1  Comment doesn't drive very far, due to eyes and neuropathy  Preparing Food and eating ? N  Using the Toilet? N  In the past six months, have you accidently leaked urine? Y  Comment wears pad prn  Do you have problems with loss of bowel control? N  Managing your Medications? N  Managing  your Finances? N  Housekeeping or managing your Housekeeping? N    Patient Care Team: Marjie Skiff, NP as PCP - General (Nurse Practitioner) Isla Pence, OD (Optometry) Zettie Pho, [facility] (Inactive) (Pharmacist)  Indicate any recent Medical Services you may have received from other than [facility] providers in the past year (date may be approximate).     Assessment:   This is a routine wellness examination for [place].  Hearing/Vision screen Hearing Screening - Comments:: Slight hearing loss Vision Screening - Comments:: Get seye exams   Goals Addressed               This Visit's Progress     Weight (lb) < 200 lb (90.7 kg) (pt-stated)   212 lb 3.2 oz (96.3 kg)     Depression Screen    04/23/2023    9:09 AM 03/12/2023   10:01 AM 12/10/2022   10:46 AM 09/07/2022    1:51 PM 06/06/2022    1:44 PM 04/24/2022    1:41 PM 04/19/2022   12:45 PM  PHQ 2/9 Scores  PHQ - 2 Score 0 0 0 0 1 2 2   PHQ- 9 Score 0 4 3 3 4 9 10     Fall Risk    04/22/2023    9:37 AM 03/12/2023   10:00 AM 12/10/2022   10:46 AM 09/07/2022    1:51 PM 06/06/2022    1:44 PM  Fall Risk   Falls in the past year? 1 1 0 0 0  Number falls in past yr: 0 0 0 0 0  Injury with Fall? 0 0 0 0 0  Risk for fall due to : History of fall(s);Impaired balance/gait;Impaired mobility No Fall Risks No  Fall Risks No Fall Risks No Fall Risks  Follow up  Falls evaluation completed Falls evaluation completed  Falls evaluation completed    MEDICARE RISK AT HOME: Medicare Risk at Home Any stairs in or around the home?: Yes If so, are there any without handrails?: No Home free of loose throw rugs in walkways, pet beds, electrical cords, etc?: Yes Adequate lighting in your home to reduce risk of falls?: Yes Life alert?: No Use of a cane, walker or w/c?: Yes (uses once in a while) Grab bars in the bathroom?: Yes Shower chair or bench in shower?: No Elevated toilet seat or a handicapped toilet?: No  TIMED UP AND  GO:  Was the test performed?  Yes  Length of time to ambulate 10 feet: 10 sec Gait slow and steady without use of assistive device    Cognitive Function:        04/23/2023    9:13 AM 04/19/2022   12:39 PM 12/25/2019   10:41 AM 04/10/2018   10:50 AM 04/04/2017   10:20 AM  6CIT Screen  What Year? 0 points 0 points 0 points 0 points 0 points  What month? 0 points 0 points 0 points 0 points 0 points  What time? 0 points 0 points 0 points 0 points 0 points  Count back from 20 0 points 0 points 2 points 0 points 0 points  Months in reverse 0 points 0 points 0 points 0 points 0 points  Repeat phrase 0 points 4 points 0 points 0 points 0 points  Total Score 0 points 4 points 2 points 0 points 0 points    Immunizations Immunization History  Administered Date(s) Administered   Fluad Quad(high Dose 65+) 03/24/2019, 04/08/2020, 03/13/2021   Fluad Trivalent(High Dose 65+) 03/12/2023   Influenza, High Dose Seasonal PF 04/25/2016, 04/04/2017, 02/18/2018, 04/06/2022   Influenza,inj,Quad PF,6+ Mos 04/25/2015   Influenza-Unspecified 04/18/2012   PFIZER(Purple Top)SARS-COV-2 Vaccination 07/29/2019, 08/19/2019, 03/20/2020, 12/03/2020, 04/28/2021   Pneumococcal Conjugate-13 04/20/2014   Pneumococcal Polysaccharide-23 10/26/2015   Pneumococcal-Unspecified 06/18/2006   Td 12/10/2022   Tdap 12/12/2010   Zoster Recombinant(Shingrix) 03/23/2021, 09/12/2021   Zoster, Live 09/21/2013    TDAP status: Up to date  Flu Vaccine status: Up to date  Pneumococcal vaccine status: Up to date  Covid-19 vaccine status: Information provided on how to obtain vaccines.   Qualifies for Shingles Vaccine? Yes   Zostavax completed No   Shingrix Completed?: Yes  Screening Tests Health Maintenance  Topic Date Due   COVID-19 Vaccine (6 - 2023-24 season) 02/17/2023   MAMMOGRAM  03/21/2023   Diabetic kidney evaluation - Urine ACR  09/07/2023   HEMOGLOBIN A1C  09/09/2023   Diabetic kidney evaluation - eGFR  measurement  12/10/2023   FOOT EXAM  03/11/2024   OPHTHALMOLOGY EXAM  03/28/2024   Medicare Annual Wellness (AWV)  04/22/2024   DEXA SCAN  02/02/2025   Colonoscopy  01/31/2030   DTaP/Tdap/Td (3 - Td or Tdap) 12/09/2032   Pneumonia Vaccine 56+ Years old  Completed   INFLUENZA VACCINE  Completed   Hepatitis C Screening  Completed   Zoster Vaccines- Shingrix  Completed   HPV VACCINES  Aged Out    Health Maintenance  Health Maintenance Due  Topic Date Due   COVID-19 Vaccine (6 - 2023-24 season) 02/17/2023   MAMMOGRAM  03/21/2023    Colorectal cancer screening: No longer required.   Mammogram status: Ordered 04/23/23. Pt provided with contact info and advised to call to schedule appt.  Bone Density status: Completed 02/03/20. Results reflect: Bone density results: OSTEOPENIA. Repeat every 5 years.  Lung Cancer Screening: (Low Dose CT Chest recommended if Age 63-80 years, 20 pack-year currently smoking OR have quit w/in 15years.) does not qualify.   Lung Cancer Screening Referral: n/a  Additional Screening:  Hepatitis C Screening: does not qualify; Completed 04/25/15  Vision Screening: Recommended annual ophthalmology exams for early detection of glaucoma and other disorders of the eye. Is the patient up to date with their annual eye exam?  Yes  Who is the provider or what is the name of the office in which the patient attends annual eye exams? Dr Clydene Pugh If pt is not established with a provider, would they like to be referred to a provider to establish care? No .   Dental Screening: Recommended annual dental exams for proper oral hygiene  Diabetic Foot Exam: Diabetic Foot Exam: Completed 03/12/23  Community Resource Referral / Chronic Care Management: CRR required this visit?  No   CCM required this visit?  No     Plan:     I have personally reviewed and noted the following in the patient's chart:   Medical and social history Use of alcohol, tobacco or illicit drugs   Current medications and supplements including opioid prescriptions. Patient is not currently taking opioid prescriptions. Functional ability and status Nutritional status Physical activity Advanced directives List of other physicians Hospitalizations, surgeries, and ER visits in previous 12 months Vitals Screenings to include cognitive, depression, and falls Referrals and appointments  In addition, I have reviewed and discussed with patient certain preventive protocols, quality metrics, and best practice recommendations. A written personalized care plan for preventive services as well as general preventive health recommendations were provided to patient.     Tora Kindred, CMA   04/23/2023   After Visit Summary: (In Person-Printed) AVS printed and given to the patient  Nurse Notes:  Placed order for MMG Declined DM & Nutrition education Covid booster done 04/11/23 per patient.

## 2023-04-23 NOTE — Patient Instructions (Addendum)
Ms. Viele , Thank you for taking time to come for your Medicare Wellness Visit. I appreciate your ongoing commitment to your health goals. Please review the following plan we discussed and let me know if I can assist you in the future.   Referrals/Orders/Follow-Ups/Clinician Recommendations: I have placed an order for a mammogram. Call [facility] @ [phone] to schedule at your earliest convenience.  This is a list of the screening recommended for you and due dates:  Health Maintenance  Topic Date Due   COVID-19 Vaccine (6 - 2023-24 season) 02/17/2023   Mammogram  03/21/2023   Yearly kidney health urinalysis for diabetes  09/07/2023   Hemoglobin A1C  09/09/2023   Yearly kidney function blood test for diabetes  12/10/2023   Complete foot exam   03/11/2024   Eye exam for diabetics  03/28/2024   Medicare Annual Wellness Visit  04/22/2024   DEXA scan (bone density measurement)  02/02/2025   Colon Cancer Screening  01/31/2030   DTaP/Tdap/Td vaccine (3 - Td or Tdap) 12/09/2032   Pneumonia Vaccine  Completed   Flu Shot  Completed   Hepatitis C Screening  Completed   Zoster (Shingles) Vaccine  Completed   HPV Vaccine  Aged Out    Advanced directives: (Copy Requested) Please bring a copy of your health care power of attorney and living will to the office to be added to your chart at your convenience.  Next Medicare Annual Wellness Visit scheduled for next year: Yes, 04/28/24 @ 10am  Fall Prevention in the Home, Adult Falls can cause injuries and affect people of all ages. There are Catherine simple things that you can do to make your home safe and to help prevent falls. If you need it, ask for help making these changes. What actions can I take to prevent falls? General information Use good lighting in all rooms. Make sure to: Replace any light bulbs that burn out. Turn on lights if it is dark and use night-lights. Keep items that you use often in easy-to-reach places. Lower  the shelves around your home if needed. Move furniture so that there are clear paths around it. Do not keep throw rugs or other things on the floor that can make you trip. If any of your floors are uneven, fix them. Add color or contrast paint or tape to clearly mark and help you see: Grab bars or handrails. First and last steps of staircases. Where the edge of each step is. If you use a ladder or stepladder: Make sure that it is fully opened. Do not climb a closed ladder. Make sure the sides of the ladder are locked in place. Have someone hold the ladder while you use it. Know where your pets are as you move through your home. What can I do in the bathroom?     Keep the floor dry. Clean up any water that is on the floor right away. Remove soap buildup in the bathtub or shower. Buildup makes bathtubs and showers slippery. Use non-skid mats or decals on the floor of the bathtub or shower. Attach bath mats securely with double-sided, non-slip rug tape. If you need to sit down while you are in the shower, use a non-slip stool. Install grab bars by the toilet and in the bathtub and shower. Do not use towel bars as grab bars. What can I do in the bedroom? Make sure that you have a light by your bed that is easy to reach. Do not use any  sheets or blankets on your bed that hang to the floor. Have a firm bench or chair with side arms that you can use for support when you get dressed. What can I do in the kitchen? Clean up any spills right away. If you need to reach something above you, use a sturdy step stool that has a grab bar. Keep electrical cables out of the way. Do not use floor polish or wax that makes floors slippery. What can I do with my stairs? Do not leave anything on the stairs. Make sure that you have a light switch at the top and the bottom of the stairs. Have them installed if you do not have them. Make sure that there are handrails on both sides of the stairs. Fix  handrails that are broken or loose. Make sure that handrails are as long as the staircases. Install non-slip stair treads on all stairs in your home if they do not have carpet. Avoid having throw rugs at the top or bottom of stairs, or secure the rugs with carpet tape to prevent them from moving. Choose a carpet design that does not hide the edge of steps on the stairs. Make sure that carpet is firmly attached to the stairs. Fix any carpet that is loose or worn. What can I do on the outside of my home? Use bright outdoor lighting. Repair the edges of walkways and driveways and fix any cracks. Clear paths of anything that can make you trip, such as tools or rocks. Add color or contrast paint or tape to clearly mark and help you see high doorway thresholds. Trim any bushes or trees on the main path into your home. Check that handrails are securely fastened and in good repair. Both sides of all steps should have handrails. Install guardrails along the edges of any raised decks or porches. Have leaves, snow, and ice cleared regularly. Use sand, salt, or ice melt on walkways during winter months if you live where there is ice and snow. In the garage, clean up any spills right away, including grease or oil spills. What other actions can I take? Review your medicines with your health care provider. Some medicines can make you confused or feel dizzy. This can increase your chance of falling. Wear closed-toe shoes that fit well and support your feet. Wear shoes that have rubber soles and low heels. Use a cane, walker, scooter, or crutches that help you move around if needed. Talk with your provider about other ways that you can decrease your risk of falls. This may include seeing a physical therapist to learn to do exercises to improve movement and strength. Where to find more information Centers for Disease Control and Prevention, [employer]: TonerPromos.no [employer] on Aging: BaseRingTones.pl National  Institute on Aging: BaseRingTones.pl Contact a health care provider if: You are afraid of falling at home. You feel weak, drowsy, or dizzy at home. You fall at home. Get help right away if you: Lose consciousness or have trouble moving after a fall. Have a fall that causes a head injury. These symptoms may be an emergency. Get help right away. Call 911. Do not wait to see if the symptoms will go away. Do not drive yourself to the hospital. This information is not intended to replace advice given to you by your health care provider. Make sure you discuss any questions you have with your health care provider. Document Revised: 02/05/2022 Document Reviewed: 02/05/2022 Elsevier Patient Education  2024 [employer].

## 2023-05-28 ENCOUNTER — Ambulatory Visit
Admission: RE | Admit: 2023-05-28 | Discharge: 2023-05-28 | Disposition: A | Discharge status: Home / Self Care | Payer: Medicare HMO | Source: Ambulatory Visit | Attending: Nurse Practitioner | Admitting: Nurse Practitioner

## 2023-05-28 DIAGNOSIS — Z1231 Encounter for screening mammogram for malignant neoplasm of breast: Secondary | ICD-10-CM | POA: Diagnosis not present

## 2023-05-30 NOTE — Progress Notes (Signed)
Contacted via MyChart   Normal mammogram, may repeat in one year:)

## 2023-06-15 NOTE — Patient Instructions (Signed)
 Be Involved in Caring For Your Health:  Taking Medications When medications are taken as directed, they can greatly improve your health. But if they are not taken as prescribed, they may not work. In some cases, not taking them correctly can be harmful. To help ensure your treatment remains effective and safe, understand your medications and how to take them. Bring your medications to each visit for review by your provider.  Your lab results, notes, and after visit summary will be available on My Chart. We strongly encourage you to use this feature. If lab results are abnormal the clinic will contact you with the appropriate steps. If the clinic does not contact you assume the results are satisfactory. You can always view your results on My Chart. If you have questions regarding your health or results, please contact the clinic during office hours. You can also ask questions on My Chart.  We at [facility] are grateful that you chose [place] to provide your care. We strive to provide evidence-based and compassionate care and are always looking for feedback. If you get a survey from the clinic please complete this so we can hear your opinions.  Diabetes Mellitus and Foot Care Diabetes, also called diabetes mellitus, may cause problems with your feet and legs because of poor blood flow (circulation). Poor circulation may make your skin: Become thinner and drier. Break more easily. Heal more slowly. Peel and crack. You may also have nerve damage (neuropathy). This can cause decreased feeling in your legs and feet. This means that you may not notice minor injuries to your feet that could lead to more serious problems. Finding and treating problems early is the best way to prevent future foot problems. How to care for your feet Foot hygiene  Wash your feet daily with warm water and mild soap. Do not use hot water. Then, pat your feet and the areas between your toes until they are fully dry. Do  not soak your feet. This can dry your skin. Trim your toenails straight across. Do not dig under them or around the cuticle. File the edges of your nails with an emery board or nail file. Apply a moisturizing lotion or petroleum jelly to the skin on your feet and to dry, brittle toenails. Use lotion that does not contain alcohol and is unscented. Do not apply lotion between your toes. Shoes and socks Wear clean socks or stockings every day. Make sure they are not too tight. Do not wear knee-high stockings. These may decrease blood flow to your legs. Wear shoes that fit well and have enough cushioning. Always look in your shoes before you put them on to be sure there are no objects inside. To break in new shoes, wear them for just a few hours a day. This prevents injuries on your feet. Wounds, scrapes, corns, and calluses  Check your feet daily for blisters, cuts, bruises, sores, and redness. If you cannot see the bottom of your feet, use a mirror or ask someone for help. Do not cut off corns or calluses or try to remove them with medicine. If you find a minor scrape, cut, or break in the skin on your feet, keep it and the skin around it clean and dry. You may clean these areas with mild soap and water. Do not clean the area with peroxide, alcohol, or iodine. If you have a wound, scrape, corn, or callus on your foot, look at it several times a day to make sure it  is healing and not infected. Check for: Redness, swelling, or pain. Fluid or blood. Warmth. Pus or a bad smell. General tips Do not cross your legs. This may decrease blood flow to your feet. Do not use heating pads or hot water bottles on your feet. They may burn your skin. If you have lost feeling in your feet or legs, you may not know this is happening until it is too late. Protect your feet from hot and cold by wearing shoes, such as at the beach or on hot pavement. Schedule a complete foot exam at least once a year or more often if  you have foot problems. Report any cuts, sores, or bruises to your health care provider right away. Where to find more information American Diabetes Association: diabetes.org Association of Diabetes Care & Education Specialists: diabeteseducator.org Contact a health care provider if: You have a condition that increases your risk of infection, and you have any cuts, sores, or bruises on your feet. You have an injury that is not healing. You have redness on your legs or feet. You feel burning or tingling in your legs or feet. You have pain or cramps in your legs and feet. Your legs or feet are numb. Your feet always feel cold. You have pain around any toenails. Get help right away if: You have a wound, scrape, corn, or callus on your foot and: You have signs of infection. You have a fever. You have a red line going up your leg. This information is not intended to replace advice given to you by your health care provider. Make sure you discuss any questions you have with your health care provider. Document Revised: 12/06/2021 Document Reviewed: 12/06/2021 Elsevier Patient Education  2024 [employer].

## 2023-06-19 DIAGNOSIS — M5126 Other intervertebral disc displacement, lumbar region: Secondary | ICD-10-CM

## 2023-06-19 HISTORY — DX: Other intervertebral disc displacement, lumbar region: M51.26

## 2023-06-20 ENCOUNTER — Ambulatory Visit: Payer: Medicare HMO | Admitting: Nurse Practitioner

## 2023-06-20 ENCOUNTER — Encounter: Payer: Self-pay | Admitting: Nurse Practitioner

## 2023-06-20 VITALS — BP 110/65 | HR 84 | Temp 98.1°F | Ht 63.0 in | Wt 214.0 lb

## 2023-06-20 DIAGNOSIS — E042 Nontoxic multinodular goiter: Secondary | ICD-10-CM

## 2023-06-20 DIAGNOSIS — E0590 Thyrotoxicosis, unspecified without thyrotoxic crisis or storm: Secondary | ICD-10-CM

## 2023-06-20 DIAGNOSIS — E1169 Type 2 diabetes mellitus with other specified complication: Secondary | ICD-10-CM | POA: Diagnosis not present

## 2023-06-20 DIAGNOSIS — E1159 Type 2 diabetes mellitus with other circulatory complications: Secondary | ICD-10-CM | POA: Diagnosis not present

## 2023-06-20 DIAGNOSIS — G4733 Obstructive sleep apnea (adult) (pediatric): Secondary | ICD-10-CM

## 2023-06-20 DIAGNOSIS — I152 Hypertension secondary to endocrine disorders: Secondary | ICD-10-CM | POA: Diagnosis not present

## 2023-06-20 DIAGNOSIS — Z7984 Long term (current) use of oral hypoglycemic drugs: Secondary | ICD-10-CM

## 2023-06-20 DIAGNOSIS — E1142 Type 2 diabetes mellitus with diabetic polyneuropathy: Secondary | ICD-10-CM | POA: Diagnosis not present

## 2023-06-20 DIAGNOSIS — E785 Hyperlipidemia, unspecified: Secondary | ICD-10-CM | POA: Diagnosis not present

## 2023-06-20 LAB — BAYER DCA HB A1C WAIVED: HB A1C (BAYER DCA - WAIVED): 6.2 % — ABNORMAL HIGH (ref 4.8–5.6)

## 2023-06-20 NOTE — Progress Notes (Signed)
 BP 110/65   Pulse 84   Temp 98.1 F (36.7 C) (Oral)   Ht 5' 3 (1.6 m)   Wt 214 lb (97.1 kg)   LMP  (LMP Unknown)   SpO2 97%   BMI 37.91 kg/m    Subjective:    Patient ID: Catherine  Hinton, female    DOB: 12-Nov-1945, 78 y.o.   MRN: [id number]  HPI: Catherine  Hinton is a 78 y.o. female  Chief Complaint  Patient presents with   Diabetes   Hyperlipidemia   Hypertension   Hypothyroidism   Osteopenia   DIABETES September A1c 6.9%.  Continues on Metformin  1000 MG BID. Is taking Gabapentin  & Duloxetine  for neuropathy which offers benefit.  Neurologist on 11/13/22 for neuropathy. 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   Hypoglycemic episodes:no Polydipsia/polyuria: no Visual disturbance: no Chest pain: no Paresthesias: no Glucose Monitoring: yes             Accucheck frequency: Daily             Fasting glucose: 117 this morning, average 124 to 126 range             Post prandial:             Evening:             Before meals: Taking Insulin?: no             Long acting insulin:             Short acting insulin: Blood Pressure Monitoring: daily Retinal Examination: Up to Date -- Dr. Mevelyn New Exam: Up to Date Pneumovax: Up to Date Influenza: Up to Date Aspirin: yes    HYPERTENSION / HYPERLIPIDEMIA Continues Metoprolol , Valsartan -HCTZ, ASA and Crestor . CPAP nightly -- last sleep study 11/14/22, moderates OSA. Satisfied with current treatment? yes Duration of hypertension: chronic BP monitoring frequency: 2 times a week BP range: 110-120/70-80 range BP medication side effects: no Duration of hyperlipidemia: chronic Cholesterol medication side effects: no Cholesterol supplements: none Medication compliance: good compliance Aspirin: yes Recent stressors: no Recurrent headaches: no Visual changes: no Palpitations: no Dyspnea: no Chest pain: no Lower extremity edema: no Dizzy/lightheaded:  no The ASCVD Risk score (Arnett DK, et al., 2019) failed to calculate for the following reasons:   The valid total cholesterol range is 130 to 320 mg/dL  HYPERTHYROIDISM Saw [facility] endocrinology 03/18/23, they stopped Armour 15 MG.  Taking Metoprolol  for HR control.  Does have upper tremor noted.  Had thyroid  ultrasound with endo and one new small nodule noted, but no biopsy at this time. Fatigue: yes Cold intolerance: no Heat intolerance: yes, at baseline Weight gain: no Weight loss: no Constipation: occasional Diarrhea/loose stools: no Palpitations: no Lower extremity edema: no Anxiety/depressed mood: no    06/20/2023    9:59 AM 04/23/2023    9:09 AM 03/12/2023   10:01 AM 12/10/2022   10:46 AM 09/07/2022    1:51 PM  Depression screen PHQ 2/9  Decreased Interest 0 0 0 0 0  Down, Depressed, Hopeless 0 0 0 0 0  PHQ - 2 Score 0 0 0 0 0  Altered sleeping 1 0 1 1 1   Tired, decreased energy 2 0 1 1 1   Change in appetite 1 0 2 1 1   Feeling bad or failure about yourself  0 0 0 0 0  Trouble concentrating 0 0 0 0 0  Moving slowly or fidgety/restless 0 0  0 0 0  Suicidal thoughts 0 0 0 0 0  PHQ-9 Score 4 0 4 3 3   Difficult doing work/chores Somewhat difficult Not difficult at all Not difficult at all Not difficult at all Somewhat difficult       06/20/2023   10:00 AM 03/12/2023   10:02 AM 12/10/2022   10:46 AM 09/07/2022    1:52 PM  GAD 7 : Generalized Anxiety Score  Nervous, Anxious, on Edge 1 0 0 1  Control/stop worrying 1 0 0 0  Worry too much - different things 1 0 0 0  Trouble relaxing 1 0 0 0  Restless 0 0 0 0  Easily annoyed or irritable 0 0 0 0  Afraid - awful might happen 0 0 0 0  Total GAD 7 Score 4 0 0 1  Anxiety Difficulty Somewhat difficult Not difficult at all Not difficult at all Not difficult at all   Relevant past medical, surgical, family and social history reviewed and updated as indicated. Interim medical history since our last visit reviewed. Allergies  and medications reviewed and updated.  Review of Systems  Constitutional:  Negative for activity change, appetite change, diaphoresis, fatigue and fever.  Respiratory:  Negative for cough, chest tightness, shortness of breath and wheezing.   Cardiovascular:  Negative for chest pain, palpitations and leg swelling.  Gastrointestinal: Negative.   Endocrine: Negative for cold intolerance, heat intolerance, polydipsia, polyphagia and polyuria.  Genitourinary: Negative.   Neurological: Negative.   Psychiatric/Behavioral: Negative.     Per HPI unless specifically indicated above     Objective:    BP 110/65   Pulse 84   Temp 98.1 F (36.7 C) (Oral)   Ht 5' 3 (1.6 m)   Wt 214 lb (97.1 kg)   LMP  (LMP Unknown)   SpO2 97%   BMI 37.91 kg/m   Wt Readings from Last 3 Encounters:  06/20/23 214 lb (97.1 kg)  04/23/23 212 lb 3.2 oz (96.3 kg)  03/12/23 211 lb 6.4 oz (95.9 kg)    Physical Exam Vitals and nursing note reviewed.  Constitutional:      General: She is awake. She is not in acute distress.    Appearance: She is well-developed. She is obese. She is not ill-appearing.  HENT:     Head: Normocephalic.     Right Ear: Hearing, tympanic membrane and ear canal normal. No drainage.     Left Ear: Hearing, tympanic membrane, ear canal and external ear normal. No drainage.     Nose: Nose normal.     Mouth/Throat:     Mouth: Mucous membranes are moist.     Pharynx: Oropharynx is clear.  Eyes:     General: Lids are normal.        Right eye: No discharge.        Left eye: No discharge.     Conjunctiva/sclera: Conjunctivae normal.     Pupils: Pupils are equal, round, and reactive to light.  Neck:     Vascular: No carotid bruit.  Cardiovascular:     Rate and Rhythm: Normal rate and regular rhythm.     Heart sounds: Normal heart sounds. No murmur heard.    No gallop.  Pulmonary:     Effort: Pulmonary effort is normal. No accessory muscle usage or respiratory distress.     Breath  sounds: Normal breath sounds.  Abdominal:     General: Bowel sounds are normal.     Palpations: Abdomen is soft.  Musculoskeletal:  Cervical back: Normal range of motion and neck supple.     Right lower leg: No edema.     Left lower leg: No edema.  Lymphadenopathy:     Cervical: No cervical adenopathy.  Skin:    General: Skin is warm and dry.  Neurological:     Mental Status: She is alert and oriented to person, place, and time.  Psychiatric:        Attention and Perception: Attention normal.        Mood and Affect: Mood normal.        Behavior: Behavior normal. Behavior is cooperative.        Thought Content: Thought content normal.        Judgment: Judgment normal.    Results for orders placed or performed in visit on 04/01/23  HM DIABETES EYE EXAM   Collection Time: 03/29/23 12:00 AM  Result Value Ref Range   HM Diabetic Eye Exam No Retinopathy No Retinopathy      Assessment & Plan:   Problem List Items Addressed This Visit       Cardiovascular and Mediastinum   Hypertension associated with diabetes (HCC)   Chronic, stable.  BP well below goal for age today.  Continue Valsartan -HCTZ 80 MG-12.5 MG daily and Metoprolol  XL 50 MG daily due to tachycardia (thyroid  related).  LABS: CMP.  Urine ALB 26 August 2022. Could consider discontinuation of HCTZ at upcoming visits, as BP well controlled and history of gout.  DASH diet focus at home and monitor BPs, bring levels to visit.        Relevant Orders   Bayer DCA Hb A1c Waived   Comprehensive metabolic panel     Respiratory   Obstructive sleep apnea   Chronic, stable.  Continue nightly use, praised for this.          Endocrine   Hyperlipidemia associated with type 2 diabetes mellitus (HCC)   Chronic, stable.  Tolerating Crestor , will continue this and adjust as needed.  Lipid panel today.      Relevant Orders   Bayer DCA Hb A1c Waived   Comprehensive metabolic panel   Lipid Panel w/o Chol/HDL Ratio    Hyperthyroidism   Chronic, ongoing.  Currently no medication.  Followed by endocrinology.  Continue this, recent notes reviewed.      Multiple thyroid  nodules   Followed by endocrinology with recent ultrasound.  Continue this collaboration, recent notes and imaging reviewed.      Type 2 diabetes mellitus with diabetic polyneuropathy, without long-term current use of insulin (HCC) - Primary   Chronic, ongoing.  A1c 6.2% today, well at goal. Urine ALB 10 (March 2024).  Will continue Metformin  XR 1000 MG BID and discussed with her may need to adjust regimen if ongoing elevations noted.  Focus on diabetic diet at home and regular exercise.  Continue Gabapentin  daily for neuropathy, 900 MG at HS and 600 MG in morning + 600 MG at evening time + Duloxetine  60 MG daily.  Current CrCl = 117 based on recent labs.   Recommend she monitor feet closely for any wounds or skin breakdown, alert provider immediately if present.  Continue collaboration with neurology.  Return in 3 months.  May need to consider pain management or Lyrica if worsening pain. - Eye exam and foot exam up to date - Vaccines up to date - Statin and ARB on board      Relevant Orders   Bayer DCA Hb A1c Waived  Other   Morbid obesity (HCC)   BMI 37.91.  Recommended eating smaller high protein, low fat meals more frequently and exercising 30 mins a day 5 times a week with a goal of 10-15lb weight loss in the next 3 months. Patient voiced their understanding and motivation to adhere to these recommendations.         Follow up plan: Return in about 3 months (around 09/18/2023) for T2DM, HTN/HLD -- please schedule after 09/18/23.

## 2023-06-20 NOTE — Assessment & Plan Note (Signed)
 Chronic, stable.  BP well below goal for age today.  Continue Valsartan -HCTZ 80 MG-12.5 MG daily and Metoprolol  XL 50 MG daily due to tachycardia (thyroid  related).  LABS: CMP.  Urine ALB 26 August 2022. Could consider discontinuation of HCTZ at upcoming visits, as BP well controlled and history of gout.  DASH diet focus at home and monitor BPs, bring levels to visit.

## 2023-06-20 NOTE — Assessment & Plan Note (Signed)
BMI 37.91.  Recommended eating smaller high protein, low fat meals more frequently and exercising 30 mins a day 5 times a week with a goal of 10-15lb weight loss in the next 3 months. Patient voiced their understanding and motivation to adhere to these recommendations.

## 2023-06-20 NOTE — Assessment & Plan Note (Signed)
 Chronic, stable.  Tolerating Crestor, will continue this and adjust as needed.  Lipid panel today.

## 2023-06-20 NOTE — Assessment & Plan Note (Signed)
 Chronic, ongoing.  Currently no medication.  Followed by endocrinology.  Continue this, recent notes reviewed.

## 2023-06-20 NOTE — Assessment & Plan Note (Signed)
 Followed by endocrinology with recent ultrasound.  Continue this collaboration, recent notes and imaging reviewed.

## 2023-06-20 NOTE — Assessment & Plan Note (Signed)
 Chronic, ongoing.  A1c 6.2% today, well at goal. Urine ALB 10 (March 2024).  Will continue Metformin  XR 1000 MG BID and discussed with her may need to adjust regimen if ongoing elevations noted.  Focus on diabetic diet at home and regular exercise.  Continue Gabapentin  daily for neuropathy, 900 MG at HS and 600 MG in morning + 600 MG at evening time + Duloxetine  60 MG daily.  Current CrCl = 117 based on recent labs.   Recommend she monitor feet closely for any wounds or skin breakdown, alert provider immediately if present.  Continue collaboration with neurology.  Return in 3 months.  May need to consider pain management or Lyrica if worsening pain. - Eye exam and foot exam up to date - Vaccines up to date - Statin and ARB on board

## 2023-06-20 NOTE — Assessment & Plan Note (Signed)
Chronic, stable.  Continue nightly use, praised for this.

## 2023-06-21 ENCOUNTER — Encounter: Payer: Self-pay | Admitting: Nurse Practitioner

## 2023-06-21 LAB — COMPREHENSIVE METABOLIC PANEL
ALT: 25 [IU]/L (ref 0–32)
AST: 33 [IU]/L (ref 0–40)
Albumin: 4.3 g/dL (ref 3.8–4.8)
Alkaline Phosphatase: 51 [IU]/L (ref 44–121)
BUN/Creatinine Ratio: 10 — ABNORMAL LOW (ref 12–28)
BUN: 7 mg/dL — ABNORMAL LOW (ref 8–27)
Bilirubin Total: 0.5 mg/dL (ref 0.0–1.2)
CO2: 24 mmol/L (ref 20–29)
Calcium: 9.1 mg/dL (ref 8.7–10.3)
Chloride: 99 mmol/L (ref 96–106)
Creatinine, Ser: 0.69 mg/dL (ref 0.57–1.00)
Globulin, Total: 1.9 g/dL (ref 1.5–4.5)
Glucose: 176 mg/dL — ABNORMAL HIGH (ref 70–99)
Potassium: 3.8 mmol/L (ref 3.5–5.2)
Sodium: 140 mmol/L (ref 134–144)
Total Protein: 6.2 g/dL (ref 6.0–8.5)
eGFR: 90 mL/min/{1.73_m2} (ref >59)

## 2023-06-21 LAB — LIPID PANEL W/O CHOL/HDL RATIO
Cholesterol, Total: 69 mg/dL — ABNORMAL LOW (ref 100–199)
HDL: 39 mg/dL — ABNORMAL LOW (ref >39)
LDL Chol Calc (NIH): 10 mg/dL (ref 0–99)
Triglycerides: 110 mg/dL (ref 0–149)
VLDL Cholesterol Cal: 20 mg/dL (ref 5–40)

## 2023-06-21 NOTE — Progress Notes (Signed)
 Contacted via MyChart   Good morning Catherine Hinton , your labs have returned and remain stable.  No medication changes needed.  Any questions? Keep being amazing!!  Thank you for allowing me to participate in your care.  I appreciate you. Kindest regards, Harris Penton

## 2023-07-10 ENCOUNTER — Encounter: Payer: Self-pay | Admitting: Nurse Practitioner

## 2023-08-09 DIAGNOSIS — E119 Type 2 diabetes mellitus without complications: Secondary | ICD-10-CM | POA: Diagnosis not present

## 2023-08-09 DIAGNOSIS — H40013 Open angle with borderline findings, low risk, bilateral: Secondary | ICD-10-CM | POA: Diagnosis not present

## 2023-08-09 DIAGNOSIS — H0288B Meibomian gland dysfunction left eye, upper and lower eyelids: Secondary | ICD-10-CM | POA: Diagnosis not present

## 2023-08-09 DIAGNOSIS — H0288A Meibomian gland dysfunction right eye, upper and lower eyelids: Secondary | ICD-10-CM | POA: Diagnosis not present

## 2023-08-09 DIAGNOSIS — H26492 Other secondary cataract, left eye: Secondary | ICD-10-CM | POA: Diagnosis not present

## 2023-08-09 DIAGNOSIS — H04123 Dry eye syndrome of bilateral lacrimal glands: Secondary | ICD-10-CM | POA: Diagnosis not present

## 2023-08-09 DIAGNOSIS — H353131 Nonexudative age-related macular degeneration, bilateral, early dry stage: Secondary | ICD-10-CM | POA: Diagnosis not present

## 2023-08-12 ENCOUNTER — Ambulatory Visit (INDEPENDENT_AMBULATORY_CARE_PROVIDER_SITE_OTHER): Payer: Medicare HMO | Admitting: Pediatrics

## 2023-08-12 ENCOUNTER — Ambulatory Visit: Payer: Self-pay | Admitting: Nurse Practitioner

## 2023-08-12 ENCOUNTER — Ambulatory Visit
Admission: RE | Admit: 2023-08-12 | Discharge: 2023-08-12 | Disposition: A | Discharge status: Home / Self Care | Payer: Medicare HMO | Source: Ambulatory Visit | Attending: Pediatrics | Admitting: Pediatrics

## 2023-08-12 ENCOUNTER — Ambulatory Visit
Admission: RE | Admit: 2023-08-12 | Discharge: 2023-08-12 | Disposition: A | Discharge status: Home / Self Care | Payer: Medicare HMO | Attending: Pediatrics | Admitting: Pediatrics

## 2023-08-12 ENCOUNTER — Encounter: Payer: Self-pay | Admitting: Pediatrics

## 2023-08-12 VITALS — BP 134/80 | HR 108 | Temp 98.4°F | Ht 63.0 in | Wt 210.6 lb

## 2023-08-12 DIAGNOSIS — M25561 Pain in right knee: Secondary | ICD-10-CM

## 2023-08-12 DIAGNOSIS — M25551 Pain in right hip: Secondary | ICD-10-CM

## 2023-08-12 DIAGNOSIS — M1611 Unilateral primary osteoarthritis, right hip: Secondary | ICD-10-CM | POA: Diagnosis not present

## 2023-08-12 DIAGNOSIS — Z1330 Encounter for screening examination for mental health and behavioral disorders, unspecified: Secondary | ICD-10-CM | POA: Diagnosis not present

## 2023-08-12 DIAGNOSIS — M8580 Other specified disorders of bone density and structure, unspecified site: Secondary | ICD-10-CM | POA: Diagnosis not present

## 2023-08-12 DIAGNOSIS — M85861 Other specified disorders of bone density and structure, right lower leg: Secondary | ICD-10-CM | POA: Diagnosis not present

## 2023-08-12 DIAGNOSIS — M1711 Unilateral primary osteoarthritis, right knee: Secondary | ICD-10-CM | POA: Diagnosis not present

## 2023-08-12 MED ORDER — CELECOXIB 100 MG PO CAPS: 100.0000 mg | ORAL_CAPSULE | Freq: Two times a day (BID) | ORAL | 0 refills | Status: AC

## 2023-08-12 NOTE — Telephone Encounter (Signed)
  Chief Complaint: lower back pain, right hip pain Symptoms: right lower back/hip pain radiating to right knee Frequency: x several weeks, worsened and constant since last night Pertinent Negatives: Patient denies loss of bowel or bladder control, fever, abdominal pain, changes in urination, dragging right foot or leg Disposition: [] ED /[] Urgent Care (no appt availability in office) / [x] Appointment(In office/virtual)/ []  [facility]/ [] Home Care/ [] Refused Recommended Disposition /[] [facility]/ []  Follow-up with PCP Additional Notes: Patient states she has been taking ibuprofen and tylenol for pain. Patient agreeable to office visit today with available provider.   Copied from CRM [id number]. Topic: Clinical - Red Word Triage >> Aug 12, 2023  9:11 AM Elle L wrote: Red Word that prompted transfer to Nurse Triage: The patient has severe hip pain that is running down her leg down to her knee. Reason for Disposition  [1] MODERATE back pain (e.g., interferes with normal activities) AND [2] present > 3 days  Answer Assessment - Initial Assessment Questions 1. ONSET: "When did the pain begin?"      Several weeks, worse since last night.  2. LOCATION: "Where does it hurt?" (upper, mid or lower back)     Lower right back, mostly the right hip.  3. SEVERITY: "How bad is the pain?"  (e.g., Scale 1-10; mild, moderate, or severe)   - MILD (1-3): Doesn't interfere with normal activities.    - MODERATE (4-7): Interferes with normal activities or awakens from sleep.    - SEVERE (8-10): Excruciating pain, unable to do any normal activities.      7/10.  4. PATTERN: "Is the pain constant?" (e.g., yes, no; constant, intermittent)      "Pretty much constant".  5. RADIATION: "Does the pain shoot into your legs or somewhere else?"     Radiates down right leg to the right knee.  6. CAUSE:  "What do you think is causing the back pain?"      Patient states she has had nagging lower back  pain for years but "never any hip problems".  7. BACK OVERUSE:  "Any recent lifting of heavy objects, strenuous work or exercise?"     Denies. She states she had a fall but that was back in September.  8. MEDICINES: "What have you taken so far for the pain?" (e.g., nothing, acetaminophen, NSAIDS)     Tylenol and ibuprofen once in a while, last dose 0500 this AM.  9. NEUROLOGIC SYMPTOMS: "Do you have any weakness, numbness, or problems with bowel/bladder control?"     Numbness she states is not new, states she has neuropathy. "A little bit of weakness, limps a little bit"  10. OTHER SYMPTOMS: "Do you have any other symptoms?" (e.g., fever, abdomen pain, burning with urination, blood in urine)       Denies.  Protocols used: Back Pain-A-AH

## 2023-08-12 NOTE — Telephone Encounter (Signed)
FYI for today's appointment.

## 2023-08-12 NOTE — Progress Notes (Signed)
 Office Visit  BP 134/80 (BP Location: Left Arm, Patient Position: Sitting, Cuff Size: Normal)   Pulse (!) 108   Temp 98.4 F (36.9 C) (Oral)   Ht 5\' 3"  (1.6 m)   Wt 210 lb 9.6 oz (95.5 kg)   LMP  (LMP Unknown)   SpO2 98%   BMI 37.31 kg/m    Subjective:    [name], female    DOB: [birth year], 78 y.o.   MRN: [id number]  HPI: Catherine Hinton is a 78 y.o. female  Chief Complaint  Patient presents with   Pain    Lower back pain radiates to the lower right leg, worse when walking 2 weeks try Tylenol, ice and heat      Discussed the use of AI scribe software for clinical note transcription with the patient, who gave verbal consent to proceed.  History of Present Illness   Catherine Hinton is a 78 year old female who presents with worsening hip and knee pain. She is accompanied by her daughter.  Over the past two weeks, she has experienced worsening pain in her hip and knee. Initially minor, the pain has progressively intensified, becoming severe enough to disrupt her sleep over the past weekend. The pain is described as burning and aching, primarily located in the hip and radiating down to the side of the knee, with associated popping and cracking sounds. It is dull and very tender to touch, particularly in a specific area of the hip, and the knee is also sore, especially on one side. No recent falls, joint conditions, or autoimmune conditions are reported, but she suspects arthritis, although she has never received treatment for it.  She recalls a fall in September while mowing the grass, which did not result in immediate symptoms. There is no history of recent trauma.  For pain management, she has been using Tylenol, ibuprofen, ice, and heat, but the pain has continued to worsen. She does not have any Voltaren gel at home but is open to obtaining it for joint pain relief.  Her current medications include Neurontin, taken at a dose of 600 mg in the morning, 600  mg in the evening, and 900 mg at bedtime for neuropathy, and Cymbalta, which she finds helpful for nerve pain.  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     Relevant past medical, surgical, family and social history reviewed and updated as indicated. Interim medical history since our last visit reviewed. Allergies and medications reviewed and updated.  ROS per HPI unless specifically indicated above     Objective:    BP 134/80 (BP Location: Left Arm, Patient Position: Sitting, Cuff Size: Normal)   Pulse (!) 108   Temp 98.4 F (36.9 C) (Oral)   Ht 5\' 3"  (1.6 m)   Wt 210 lb 9.6 oz (95.5 kg)   LMP  (LMP Unknown)   SpO2 98%   BMI 37.31 kg/m   Wt Readings from Last 3 Encounters:  08/12/23 210 lb 9.6 oz (95.5 kg)  06/20/23 214 lb (97.1 kg)  04/23/23 212 lb 3.2 oz (96.3 kg)     Physical Exam Constitutional:      Appearance: Normal appearance.  Pulmonary:     Effort: Pulmonary effort is normal.  Musculoskeletal:     Right hip: Tenderness present. Decreased range of motion.     Left hip: Normal.     Right knee: Swelling, deformity and bony tenderness  present. No effusion or erythema. Tenderness present.     Left knee: Normal.     Comments: Right knee with swollen band link area around knee joint. Unable to tolerate movement testing due to pain w minimal palpation. Not warm.  Skin:    Comments: Normal skin color  Neurological:     General: No focal deficit present.     Mental Status: She is alert. Mental status is at baseline.  Psychiatric:        Mood and Affect: Mood normal.        Behavior: Behavior normal.        Thought Content: Thought content normal.         08/12/2023    1:43 PM 06/20/2023    9:59 AM 04/23/2023    9:09 AM 03/12/2023   10:01 AM 12/10/2022   10:46 AM  Depression screen PHQ 2/9  Decreased Interest 0 0 0 0 0  Down, Depressed, Hopeless 0 0 0 0 0  PHQ - 2 Score 0 0 0 0 0  Altered sleeping 2 1 0 1 1   Tired, decreased energy 2 2 0 1 1  Change in appetite 2 1 0 2 1  Feeling bad or failure about yourself  0 0 0 0 0  Trouble concentrating 0 0 0 0 0  Moving slowly or fidgety/restless 0 0 0 0 0  Suicidal thoughts 0 0 0 0 0  PHQ-9 Score 6 4 0 4 3  Difficult doing work/chores  Somewhat difficult Not difficult at all Not difficult at all Not difficult at all       08/12/2023    1:47 PM 06/20/2023   10:00 AM 03/12/2023   10:02 AM 12/10/2022   10:46 AM  GAD 7 : Generalized Anxiety Score  Nervous, Anxious, on Edge 2 1 0 0  Control/stop worrying 0 1 0 0  Worry too much - different things 0 1 0 0  Trouble relaxing 2 1 0 0  Restless 2 0 0 0  Easily annoyed or irritable 0 0 0 0  Afraid - awful might happen 0 0 0 0  Total GAD 7 Score 6 4 0 0  Anxiety Difficulty Somewhat difficult Somewhat difficult Not difficult at all Not difficult at all       Assessment & Plan:  Assessment & Plan   Acute pain of right knee New onset of severe pain and swelling over the past two weeks. No recent trauma. Pain is dull and worsens with movement. Tenderness on palpation of the knee and hip. Unable to tolerate ROM and ligament testing due to pain. Plan as below -Order knee and hip X-rays to rule out fracture or dislocation. -Start Celebrex twice daily for pain and inflammation. -Consider referral to orthopedic surgery pending imaging results. -Potential for physical therapy and knee brace pending imaging results. -Apply over-the-counter diclofenac gel for additional pain relief. -Follow-up in one week to assess pain management efficacy. -     Celecoxib; Take 1 capsule (100 mg total) by mouth 2 (two) times daily for 14 days.  Dispense: 28 capsule; Refill: 0 -     DG Knee Complete 4 Views Right; Future  Acute right hip pain New onset of severe pain over the past two weeks. No recent trauma. Pain is dull and worsens with movement. Tenderness on palpation of the hip and knee. Suspect related to above. -Order  hip and knee X-rays to rule out fracture or dislocation. -Start Celebrex twice daily for  pain and inflammation. -Apply over-the-counter diclofenac gel for additional pain relief. -Follow-up in one week to assess pain management efficacy.  -     DG HIP UNILAT W OR W/O PELVIS 2-3 VIEWS RIGHT; Future -     Celecoxib; Take 1 capsule (100 mg total) by mouth 2 (two) times daily for 14 days.  Dispense: 28 capsule; Refill: 0  Encounter for behavioral health screening As part of their intake evaluation, the patient was screened for depression, anxiety.  PHQ9 SCORE 6, GAD7 SCORE 6. Screening results negative for tested conditions. Slight increase in scoring due to issues above. CTM.  Follow up plan: Return in about 1 week (around 08/19/2023) for hip pain.  Jackolyn Confer, MD

## 2023-08-12 NOTE — Patient Instructions (Addendum)
 Celebrex 100mg  twice daily for pain  Pick up voltaren gel over the counter  [facility] [address] [place],  [place]  [address]

## 2023-08-14 ENCOUNTER — Emergency Department: Payer: Medicare HMO

## 2023-08-14 ENCOUNTER — Other Ambulatory Visit: Payer: Self-pay

## 2023-08-14 ENCOUNTER — Emergency Department
Admission: EM | Admit: 2023-08-14 | Discharge: 2023-08-14 | Disposition: A | Discharge status: Home / Self Care | Payer: Medicare HMO | Attending: Emergency Medicine | Admitting: Emergency Medicine

## 2023-08-14 ENCOUNTER — Ambulatory Visit: Payer: Self-pay | Admitting: Nurse Practitioner

## 2023-08-14 ENCOUNTER — Encounter: Payer: Self-pay | Admitting: Emergency Medicine

## 2023-08-14 ENCOUNTER — Encounter: Payer: Self-pay | Admitting: Nurse Practitioner

## 2023-08-14 DIAGNOSIS — M5116 Intervertebral disc disorders with radiculopathy, lumbar region: Secondary | ICD-10-CM | POA: Diagnosis not present

## 2023-08-14 DIAGNOSIS — I10 Essential (primary) hypertension: Secondary | ICD-10-CM | POA: Diagnosis not present

## 2023-08-14 DIAGNOSIS — M47816 Spondylosis without myelopathy or radiculopathy, lumbar region: Secondary | ICD-10-CM | POA: Diagnosis not present

## 2023-08-14 DIAGNOSIS — E119 Type 2 diabetes mellitus without complications: Secondary | ICD-10-CM | POA: Insufficient documentation

## 2023-08-14 DIAGNOSIS — M4726 Other spondylosis with radiculopathy, lumbar region: Secondary | ICD-10-CM | POA: Diagnosis not present

## 2023-08-14 DIAGNOSIS — M47814 Spondylosis without myelopathy or radiculopathy, thoracic region: Secondary | ICD-10-CM | POA: Diagnosis not present

## 2023-08-14 DIAGNOSIS — M79651 Pain in right thigh: Secondary | ICD-10-CM | POA: Diagnosis present

## 2023-08-14 DIAGNOSIS — M79604 Pain in right leg: Secondary | ICD-10-CM | POA: Diagnosis not present

## 2023-08-14 DIAGNOSIS — M5126 Other intervertebral disc displacement, lumbar region: Secondary | ICD-10-CM | POA: Insufficient documentation

## 2023-08-14 DIAGNOSIS — M5416 Radiculopathy, lumbar region: Secondary | ICD-10-CM | POA: Insufficient documentation

## 2023-08-14 DIAGNOSIS — M4807 Spinal stenosis, lumbosacral region: Secondary | ICD-10-CM | POA: Diagnosis not present

## 2023-08-14 MED ORDER — CYCLOBENZAPRINE HCL 10 MG PO TABS
5.0000 mg | ORAL_TABLET | Freq: Once | ORAL | Status: AC
  Administered 2023-08-14: 5 mg via ORAL
  Filled 2023-08-14: qty 1

## 2023-08-14 MED ORDER — CYCLOBENZAPRINE HCL 5 MG PO TABS: 5.0000 mg | ORAL_TABLET | Freq: Three times a day (TID) | ORAL | 0 refills | Status: DC | PRN

## 2023-08-14 MED ORDER — OXYCODONE-ACETAMINOPHEN 5-325 MG PO TABS
1.0000 | ORAL_TABLET | Freq: Once | ORAL | Status: AC
  Administered 2023-08-14: 1 via ORAL
  Filled 2023-08-14: qty 1

## 2023-08-14 MED ORDER — PREDNISONE 20 MG PO TABS: 40.0000 mg | ORAL_TABLET | Freq: Every day | ORAL | 0 refills | Status: AC

## 2023-08-14 MED ORDER — ONDANSETRON 4 MG PO TBDP
4.0000 mg | ORAL_TABLET | Freq: Once | ORAL | Status: AC
  Administered 2023-08-14: 4 mg via ORAL
  Filled 2023-08-14: qty 1

## 2023-08-14 MED ORDER — OXYCODONE-ACETAMINOPHEN 5-325 MG PO TABS
1.0000 | ORAL_TABLET | Freq: Three times a day (TID) | ORAL | 0 refills | Status: AC | PRN
Start: 2023-08-14 — End: 2023-08-19

## 2023-08-14 NOTE — ED Notes (Signed)
Pt verbalizes understanding of discharge instructions. Opportunity for questioning and answers were provided. Pt discharged from ED to home with daughter.    

## 2023-08-14 NOTE — ED Provider Notes (Signed)
 [facility] Emergency Department Provider Note     Event Date/Time   First MD Initiated Contact with Patient 08/14/23 1237     (approximate)   History   Leg Pain   HPI  Catherine Hinton is a 78 y.o. female with a history of HTN, diabetes, HLD, gout, obesity, and diabetic neuropathy, presents to the ED complaints of increasing right anterior thigh pain.  Patient denies any preceding injury, trauma, or fall.  She would endorse several weeks of symptoms, but notes things have worsened over the last week.  No reports of any bladder or bowel incontinence, foot drop, saddle anesthesia.  She had been endorsing pain from the right hip to the knee, and presented to her PCP for evaluation of symptoms on Monday.  The PCP did subsequent x-rays of the hip and knee showing only some arthritic changes and osteopenia and hyperostosis.  According to chart review, the plan was to follow-up next week to discuss ordering outpatient MRI.  The patient presents today accompanied by her adult daughter, noting significant increase in her pain.  Patient endorses pain at a 9 out of 10 to the left leg.  Patient denies any comfortable position, noting that sitting, standing, and lying on the back increases her symptomology.  She does find some relief with lying on her left side with a pillow between the knees.  Her previous prescription of gabapentin for diabetic neuropathy has not been affecting the new RLE pain.  The previously prescribed Celebrex is also not been beneficial according to the patient.  Physical Exam   Triage Vital Signs: ED Triage Vitals  Encounter Vitals Group     BP 08/14/23 1015 127/80     Systolic BP Percentile --      Diastolic BP Percentile --      Pulse Rate 08/14/23 1015 (!) 105     Resp 08/14/23 1015 18     Temp 08/14/23 1015 98.6 F (37 C)     Temp Source 08/14/23 1015 Oral     SpO2 08/14/23 1015 94 %     Weight 08/14/23 1014 205 lb (93 kg)     Height  08/14/23 1014 5\' 3"  (1.6 m)     Head Circumference --      Peak Flow --      Pain Score 08/14/23 1014 7     Pain Loc --      Pain Education --      Exclude from Growth Chart --     Most recent vital signs: Vitals:   08/14/23 1409 08/14/23 1932  BP: (!) 158/72 (!) 151/96  Pulse: 97 97  Resp: 18 18  Temp:    SpO2: 99% 100%    General Awake, no distress. NAD HEENT NCAT. PERRL. EOMI. No rhinorrhea. Mucous membranes are moist.  CV:  Good peripheral perfusion. RRR RESP:  Normal effort. CTA ABD:  No distention.  MSK:  Normal spinal alignment without midline tenderness, spasm, deformity, or step-off.  Active range of motion of lower extremities bilaterally.  Normal hip flexion extension range on exam. NEURO: Cranial nerves II to XII grossly intact.  Normal LE DTRs bilaterally.  Normal toe dorsiflexion and foot eversion.  Negative seated straight leg raise bilaterally.   ED Results / Procedures / Treatments   Labs (all labs ordered are listed, but only abnormal results are displayed) Labs Reviewed - No data to display   EKG   RADIOLOGY  I personally viewed and evaluated these  images as part of my medical decision making, as well as reviewing the written report by the radiologist.  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 EXAM: MRI LUMBAR SPINE WITHOUT CONTRAST TECHNIQUE: Multiplanar, multisequence MR imaging of the lumbar spine was performed. No intravenous contrast was administered. COMPARISON:  Radiograph from earlier the same day. FINDINGS: Segmentation: Standard. Lowest well-formed disc space labeled the L5-S1 level. Alignment: Trace levoscoliosis. Trace 2 mm facet mediated anterolisthesis of L3 and L4 through L5 on S1. trace retrolisthesis of L1 on L2. Vertebrae: Vertebral body height  maintained without acute or chronic fracture. Bone marrow signal intensity within normal limits. No discrete or worrisome osseous lesions. Mild degenerate reactive endplate changes present about the T11-12 and L2-3 interspaces. No other abnormal marrow edema. Conus medullaris and cauda equina: Conus extends to the L1 level. Conus and cauda equina appear normal. Paraspinal and other soft tissues: Unremarkable. Disc levels: T11-12: Seen only on sagittal projection. Disc desiccation with mild disc bulge and reactive endplate spurring. No stenosis. T12-L1: Unremarkable. L1-2: Disc desiccation with mild diffuse disc bulge. Mild reactive endplate spurring. Mild bilateral facet hypertrophy. No spinal stenosis. Foramina remain patent. L2-3: Mild diffuse disc bulge with disc desiccation, asymmetric to the right. Superimposed right subarticular disc extrusion with inferior migration (series 5, image 6). Secondary impingement of the descending right L3 nerve root (series 8, image 15). Superimposed mild bilateral facet hypertrophy. Resultant severe right lateral recess stenosis. Central canal remains patent. Mild bilateral L2 foraminal narrowing. L3-4: Disc desiccation with mild disc bulge. Moderate bilateral facet hypertrophy. Resultant mild narrowing of the lateral recesses bilaterally. Central canal remains patent. Mild bilateral L3 foraminal stenosis. L4-5: Disc desiccation with mild diffuse disc bulge. Moderate bilateral facet hypertrophy. Resultant mild bilateral subarticular stenosis. Central canal remains patent. Mild bilateral L4 foraminal narrowing. L5-S1: Disc desiccation with mild disc bulge. Moderate left worse than right facet hypertrophy. Resultant mild narrowing of the lateral recesses bilaterally. Central canal remains patent. Mild bilateral L5 foraminal stenosis. IMPRESSION: 1. Right subarticular disc extrusion with inferior migration at L2-3, impinging upon the descending right L3 nerve root in the right  lateral recess. 2. Additional mild multilevel degenerative spondylosis and facet arthrosis as above. Resultant mild bilateral subarticular stenosis at L3-4 through L5-S1. 3. Mild bilateral L2 through L5 foraminal stenosis related to disc bulge and facet hypertrophy. Electronically Signed   By: Rise Mu M.D.   On: 08/14/2023 19:13   DG Lumbar Spine Complete Result Date: 08/14/2023 CLINICAL DATA:  Right lower extremity lumbar radiculopathy. EXAM: LUMBAR SPINE - COMPLETE 4+ VIEW COMPARISON:  CT abdomen and pelvis 02/16/2021 FINDINGS: Normal frontal alignment. Minimal retrolisthesis of L1 on L2, similar to prior CT. Vertebral body heights are maintained. Minimal posterior L1-2 through L3-4 and minimal posterior L5-S1 disc space narrowing. Moderate L4-5 facet joint sclerosis and hypertrophy. Hernia mesh coils overlie the right abdomen and pelvis. Right upper quadrant cholecystectomy clips. IMPRESSION: 1. Minimal retrolisthesis of L1 on L2, unchanged. 2. Minimal posterior L1-2 through L3-4 and minimal posterior L5-S1 disc space narrowing. 3. 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 Electronically Signed   By: Neita Garnet M.D.   On: 08/14/2023 15:51     PROCEDURES:  Critical Care performed: No  Procedures   MEDICATIONS ORDERED IN ED: Medications  ondansetron (ZOFRAN-ODT) disintegrating tablet 4 mg (4 mg Oral Given  08/14/23 1402)  oxyCODONE-acetaminophen (PERCOCET/ROXICET) 5-325 MG per tablet 1 tablet (1 tablet Oral Given 08/14/23 1402)  cyclobenzaprine (FLEXERIL) tablet 5 mg (5 mg Oral Given 08/14/23 1931)  oxyCODONE-acetaminophen (PERCOCET/ROXICET) 5-325 MG per tablet 1 tablet (1 tablet Oral Given 08/14/23 1931)     IMPRESSION / MDM / ASSESSMENT AND PLAN / ED COURSE  I reviewed the triage vital signs and the nursing notes.                              Differential diagnosis includes, but is not limited to, lumbar radiculopathy, HNP, myalgias, muscle strain, quadricep muscle  injury  Patient's presentation is most consistent with acute complicated illness / injury requiring diagnostic workup.  Patient's diagnosis is consistent with acute RLE radicular symptoms concerning for an acute radiculopathy.  Patient presents with increasing right anterior thigh discomfort.  Exam is concerning for a possible L3 nerve root impingement.  Plain x-rays of the lumbar spine showing degenerative changes and facet arthropathy throughout.  MRI of the lumbar sacral spine, confirms an L3 HNP with nerve root impingement on the right.  Patient with otherwise improved symptoms after p.o. medication ministration, is reassured by her exam and workup at this time.  She is inclined to start a steroid dose as well as pain medicines and muscle relaxants as discussed.  Patient will be discharged home with prescriptions for prednisone, cyclobenzaprine, and Percocet. Patient is to follow up with her PCP or neurologist as discussed, as needed or otherwise directed.  Patient will likely be referred to neurosurgery for possible surgical intervention.  Patient is given ED precautions to return to the ED for any worsening or new symptoms.  FINAL CLINICAL IMPRESSION(S) / ED DIAGNOSES   Final diagnoses:  Acute lumbar radiculopathy  HNP (herniated nucleus pulposus), lumbar     Rx / DC Orders   ED Discharge Orders          Ordered    cyclobenzaprine (FLEXERIL) 5 MG tablet  3 times daily PRN        08/14/23 1925    oxyCODONE-acetaminophen (PERCOCET) 5-325 MG tablet  Every 8 hours PRN        08/14/23 1925    predniSONE (DELTASONE) 20 MG tablet  Daily with breakfast        08/14/23 1925             Note:  This document was prepared using Dragon voice recognition software and may include unintentional dictation errors.    Lissa Hoard, PA-C 08/14/23 1945    Janith Lima, MD 08/15/23 [phone]

## 2023-08-14 NOTE — ED Notes (Signed)
Pt gone to xray

## 2023-08-14 NOTE — ED Triage Notes (Signed)
 Patient to ED via POV for right leg pain. Ongoing for a couple weeks but pain worsening. Denies injury. States pain is from hip to knee. Saw PCP on Monday and had x-ray that did not show anything.

## 2023-08-14 NOTE — ED Notes (Signed)
 MRI called to screen patient

## 2023-08-14 NOTE — ED Notes (Signed)
 Pt given [place] sandwich tray at this time

## 2023-08-14 NOTE — Telephone Encounter (Signed)
 Copied from CRM [id number]. Topic: Clinical - Red Word Triage >> Aug 14, 2023  9:02 AM Gildardo Pounds wrote: Red Word that prompted transfer to Nurse Triage: Tremendous pain in her leg and back, pain level is 9; seen Monday. xray didn't show any fractures. callback number [phone], Mindi Junker, daughter   Chief Complaint: back/leg pain Symptoms: 9/10 pain in "lower back and down the side of her leg,"  Frequency: constant Pertinent Negatives: Patient daughter denies fever, abdominal pain, trouble with bowel/urination to her knowledge Disposition: [] 911 / [x] ED /[] Urgent Care (no appt availability in office) / [] Appointment(In office/virtual)/ []  [facility]/ [] Home Care/ [] Refused Recommended Disposition /[] [facility]/ []  Follow-up with PCP Additional Notes: Pt daughter reporting pt was seen in office on Monday for this pain, "been in pain for a long time, 3 months, but severity of it has really changed, yesterday really has gotten worse," confirms "was probably 6/10 before, not able to do normal activities," but worsening now, pt told daughter she's in "excruciating pain today, wants to go to ER." Pt daughter reporting 9/10 pain to pt's "lower back and down the side of her leg," confirms just one leg, daughter unsure if any swelling or redness. Pt daughter confirms x-ray on Monday didn't show fractures and was prescribed "celebrex for her, been taking that, takes gabapentin for neuropathy," but not helping pain. Pt daughter confirms no sudden onset of pain but pain now is severe and "constant." Earliest appt with PCP office is 1 pm, advised pt be examined sooner, may be given more immediate relief to go to ED. Advised pt be examined in ED for her symptoms, pt daughter agreed and will take her there shortly.  Reason for Disposition  [1] SEVERE back pain (e.g., excruciating, unable to do any normal activities) AND [2] not improved 2 hours after pain medicine  Answer Assessment - Initial  Assessment Questions 1. ONSET: "When did the pain begin?"      Not sure 2. LOCATION: "Where does it hurt?" (upper, mid or lower back)     Lower back and down the side of her leg just one leg 3. SEVERITY: "How bad is the pain?"  (e.g., Scale 1-10; mild, moderate, or severe)   - MILD (1-3): Doesn't interfere with normal activities.    - MODERATE (4-7): Interferes with normal activities or awakens from sleep.    - SEVERE (8-10): Excruciating pain, unable to do any normal activities.      Excruciating pain today, wants to go to ED 4. PATTERN: "Is the pain constant?" (e.g., yes, no; constant, intermittent)      constant 5. RADIATION: "Does the pain shoot into your legs or somewhere else?"     Shoots into leg 8. MEDICINES: "What have you taken so far for the pain?" (e.g., nothing, acetaminophen, NSAIDS)     Prescribed celebrex for her been taking that, takes gabapentin for neuropathy, not helping 9. NEUROLOGIC SYMPTOMS: "Do you have any weakness, numbness, or problems with bowel/bladder control?"     Not that I know of 10. OTHER SYMPTOMS: "Do you have any other symptoms?" (e.g., fever, abdomen pain, burning with urination, blood in urine)       Not that I know of  Protocols used: Back Pain-A-AH

## 2023-08-14 NOTE — Discharge Instructions (Addendum)
 Your exam and MRI confirmed radicular symptoms in the L3 nerve root.  Your MRI shows a herniated disc at the L3 level.  This is likely causing your new right leg symptoms.  Take prescription meds as directed.  Follow-up with your primary provider for ongoing evaluation and referral to neurology/neurosurgery for further management and intervention.

## 2023-08-15 ENCOUNTER — Telehealth: Payer: Self-pay | Admitting: Neurosurgery

## 2023-08-15 NOTE — Telephone Encounter (Signed)
 Patient's daughter, Mindi Junker is calling to schedule an ER Follow up for her mother. Please advise if patient should see a PA or MD.   IMPRESSION: 1. Right subarticular disc extrusion with inferior migration at L2-3, impinging upon the descending right L3 nerve root in the right lateral recess. 2. Additional mild multilevel degenerative spondylosis and facet arthrosis as above. Resultant mild bilateral subarticular stenosis at L3-4 through L5-S1. 3. Mild bilateral L2 through L5 foraminal stenosis related to disc bulge and facet hypertrophy.

## 2023-08-21 NOTE — H&P (View-Only) (Signed)
 Referring Physician:  Marjie Skiff, NP [address] [place],  [place] [address]  Primary Physician:  Marjie Skiff, NP  History of Present Illness: 08/21/2023*** [name] has a history of HTN, OSA, hyperlipidemia, DM, hyperthyroidism, osteopenia, obesity, gout.   Seen in ED on 08/14/23 for right anterior thigh pain.    She is taking flexeril, celebrex, cymbalta, and neurontin. Was given percocet, flexeril, and prednisone from ED.   Duration: *** Location: *** Quality: *** Severity: ***  Precipitating: aggravated by *** Modifying factors: made better by *** Weakness: none Timing: ***  She does not smoke.   Bowel/Bladder Dysfunction: none  Conservative measures:  Physical therapy: *** has not participated in? Multimodal medical therapy including regular antiinflammatories: *** celebrex, flexeril, cymbalta, gabapentin,  Injections: *** no epidural steroid injections?  Past Surgery: ***no spinal surgeries  [name] has ***no symptoms of cervical myelopathy.  The symptoms are causing a significant impact on the patient's life.   Review of Systems:  A 10 point review of systems is negative, except for the pertinent positives and negatives detailed in the HPI.  Past Medical History: Past Medical History:  Diagnosis Date   Chronic kidney disease 04/25/2015   Diabetes mellitus with renal manifestation (HCC)    Diabetes mellitus without complication (HCC)    Glaucoma    Gout    Hyperlipidemia    Hypertension    Hypertensive CKD (chronic kidney disease)    Neuropathy    feet   Obesity    Sleep apnea    CPAP    Past Surgical History: Past Surgical History:  Procedure Laterality Date   ABDOMINAL HYSTERECTOMY     BREAST CYST ASPIRATION     CHOLECYSTECTOMY     COLONOSCOPY WITH PROPOFOL N/A 02/01/2020   Procedure: COLONOSCOPY WITH PROPOFOL;  Surgeon: [name], MD;  Location: [facility];  Service: Endoscopy;  Laterality: N/A;   Diabetic - oral meds sleep apnea priority 4   HERNIA REPAIR     THUMB FUSION      Allergies: Allergies as of 08/22/2023 - Review Complete 08/14/2023  Allergen Reaction Noted   Niacin Itching 12/01/2014   Sulfa antibiotics Other (See Comments) 12/01/2014    Medications: Outpatient Encounter Medications as of 08/22/2023  Medication Sig   allopurinol (ZYLOPRIM) 300 MG tablet Take 1 tablet (300 mg total) by mouth daily.   aspirin 81 MG EC tablet Take 81 mg by mouth daily.    Blood Glucose Monitoring Suppl (TRUE METRIX AIR GLUCOSE METER) w/Device KIT Use to check blood sugar 2-3 times a day and document.  Bring log to visits.   celecoxib (CELEBREX) 100 MG capsule Take 1 capsule (100 mg total) by mouth 2 (two) times daily for 14 days.   Cholecalciferol 25 MCG (1000 UT) tablet Take 2,000 Units by mouth daily.    cyclobenzaprine (FLEXERIL) 5 MG tablet Take 1 tablet (5 mg total) by mouth 3 (three) times daily as needed.   DHA-EPA-Flaxseed Oil-Vitamin E (THERA TEARS NUTRITION PO) Take 1 mg by mouth 2 (two) times daily.    DULoxetine (CYMBALTA) 60 MG capsule Take 1 capsule (60 mg total) by mouth daily.   gabapentin (NEURONTIN) 300 MG capsule TAKE 2 CAPSULES EVERY MORNING, AND THEN 2 CAPSULES AT 2 PM, AND THEN TAKE 3 CAPSULES EVERY NIGHT BEFORE BEDTIME.   metFORMIN (GLUCOPHAGE-XR) 500 MG 24 hr tablet TAKE 2 TABLETS EVERY MORNING  AND TAKE 2 TABLETS AT BEDTIME   metoprolol succinate (TOPROL-XL) 50 MG  24 hr tablet Take 1 tablet (50 mg total) by mouth daily. Take with or immediately following a meal.   Multiple Vitamins-Minerals (EYE VITAMINS PO) Take 1 tablet by mouth daily.   rosuvastatin (CRESTOR) 20 MG tablet Take 1 tablet (20 mg total) by mouth daily.   TRUE METRIX BLOOD GLUCOSE TEST test strip TEST BLOOD SUGAR 2 TO 3 TIMES DAILY AND DOCUMENT. BRING LOG TO VISITS.   TRUEplus Lancets 33G MISC Use to check blood sugar 2 to 3 times a day and document. Bring log to visits.    valsartan-hydrochlorothiazide (DIOVAN-HCT) 80-12.5 MG tablet Take 1 tablet by mouth daily.   vitamin B-12 (CYANOCOBALAMIN) 1000 MCG tablet Take 1,000 mcg by mouth daily.   XIIDRA 5 % SOLN Place 1 drop into both eyes 2 (two) times daily.    No facility-administered encounter medications on file as of 08/22/2023.    Social History: Social History   Tobacco Use   Smoking status: Never   Smokeless tobacco: Never  Vaping Use   Vaping status: Never Used  Substance Use Topics   Alcohol use: No    Alcohol/week: 0.0 standard drinks of alcohol   Drug use: No    Family Medical History: Family History  Problem Relation Age of Onset   Diabetes Mother    Cancer Mother        Pituitary tumor   Hyperlipidemia Mother    Hypertension Mother    Thyroid disease Mother    Stroke Mother    Cancer Father        Lung   Cancer Brother        Oral   Cancer Maternal Grandmother        colon   Blindness Maternal Grandfather    Arthritis Paternal Grandmother    Sleep apnea Daughter    Arthritis Daughter        RA   Sleep apnea Daughter    Hypertension Son    Breast cancer Neg Hx     Physical Examination: There were no vitals filed for this visit.  General: Patient is well developed, well nourished, calm, collected, and in no apparent distress. Attention to examination is appropriate.  Respiratory: Patient is breathing without any difficulty.   NEUROLOGICAL:     Awake, alert, oriented to person, place, and time.  Speech is clear and fluent. Fund of knowledge is appropriate.   Cranial Nerves: Pupils equal round and reactive to light.  Facial tone is symmetric.    *** ROM of cervical spine *** pain *** posterior cervical tenderness. *** tenderness in bilateral trapezial region.   *** ROM of lumbar spine *** pain *** posterior lumbar tenderness.   No abnormal lesions on exposed skin.   Strength: Side Biceps Triceps Deltoid Interossei Grip Wrist Ext. Wrist Flex.  R 5 5 5 5 5 5 5    L 5 5 5 5 5 5 5    Side Iliopsoas Quads Hamstring PF DF EHL  R 5 5 5 5 5 5   L 5 5 5 5 5 5    Reflexes are ***2+ and symmetric at the biceps, brachioradialis, patella and achilles.   Hoffman's is absent.  Clonus is not present.   Bilateral upper and lower extremity sensation is intact to light touch.     Gait is normal.   ***No difficulty with tandem gait.    Medical Decision Making  Imaging: MRI lumbar spine dated 08/14/23:  FINDINGS: Segmentation: Standard. Lowest well-formed disc space labeled the L5-S1 level.  Alignment: Trace levoscoliosis. Trace 2 mm facet mediated anterolisthesis of L3 and L4 through L5 on S1. trace retrolisthesis of L1 on L2.   Vertebrae: Vertebral body height maintained without acute or chronic fracture. Bone marrow signal intensity within normal limits. No discrete or worrisome osseous lesions. Mild degenerate reactive endplate changes present about the T11-12 and L2-3 interspaces. No other abnormal marrow edema.   Conus medullaris and cauda equina: Conus extends to the L1 level. Conus and cauda equina appear normal.   Paraspinal and other soft tissues: Unremarkable.   Disc levels:   T11-12: Seen only on sagittal projection. Disc desiccation with mild disc bulge and reactive endplate spurring. No stenosis.   T12-L1: Unremarkable.   L1-2: Disc desiccation with mild diffuse disc bulge. Mild reactive endplate spurring. Mild bilateral facet hypertrophy. No spinal stenosis. Foramina remain patent.   L2-3: Mild diffuse disc bulge with disc desiccation, asymmetric to the right. Superimposed right subarticular disc extrusion with inferior migration (series 5, image 6). Secondary impingement of the descending right L3 nerve root (series 8, image 15). Superimposed mild bilateral facet hypertrophy. Resultant severe right lateral recess stenosis. Central canal remains patent. Mild bilateral L2 foraminal narrowing.   L3-4: Disc desiccation with mild  disc bulge. Moderate bilateral facet hypertrophy. Resultant mild narrowing of the lateral recesses bilaterally. Central canal remains patent. Mild bilateral L3 foraminal stenosis.   L4-5: Disc desiccation with mild diffuse disc bulge. Moderate bilateral facet hypertrophy. Resultant mild bilateral subarticular stenosis. Central canal remains patent. Mild bilateral L4 foraminal narrowing.   L5-S1: Disc desiccation with mild disc bulge. Moderate left worse than right facet hypertrophy. Resultant mild narrowing of the lateral recesses bilaterally. Central canal remains patent. Mild bilateral L5 foraminal stenosis.   IMPRESSION: 1. Right subarticular disc extrusion with inferior migration at L2-3, impinging upon the descending right L3 nerve root in the right lateral recess. 2. Additional mild multilevel degenerative spondylosis and facet arthrosis as above. Resultant mild bilateral subarticular stenosis at L3-4 through L5-S1. 3. Mild bilateral L2 through L5 foraminal stenosis related to disc bulge and facet hypertrophy.     Electronically Signed   By: Rise Mu M.D.   On: 08/14/2023 19:13  Lumbar xrays dated 08/14/23:  FINDINGS: Normal frontal alignment. Minimal retrolisthesis of L1 on L2, similar to prior CT.   Vertebral body heights are maintained. Minimal posterior L1-2 through L3-4 and minimal posterior L5-S1 disc space narrowing. Moderate L4-5 facet joint sclerosis and hypertrophy.   Hernia mesh coils overlie the right abdomen and pelvis. Right upper quadrant cholecystectomy clips.   IMPRESSION: 1. Minimal retrolisthesis of L1 on L2, unchanged. 2. Minimal posterior L1-2 through L3-4 and minimal posterior L5-S1 disc space narrowing. 3. 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     Electronically Signed   By: Neita Garnet M.D.   On: 08/14/2023 15:51    I have personally reviewed the images and agree with the above interpretation.  Assessment and  Plan: Ms. Low is a pleasant 78 y.o. female has ***  Treatment options discussed with patient and following plan made:   - Order for physical therapy for *** spine ***. Patient to call to schedule appointment. *** - Continue current medications including ***. Reviewed dosing and side effects.  - Prescription for ***. Reviewed dosing and side effects. Take with food.  - Prescription for *** to take prn muscle spasms. Reviewed dosing and side effects. Discussed this can cause drowsiness.  - MRI of *** to further evaluate *** radiculopathy. No improvement time or  medications (***).  - Referral to PMR at [facility] to discuss possible *** injections.  - Will schedule phone visit to review MRI results once I get them back.   I spent a total of *** minutes in face-to-face and non-face-to-face activities related to this patient's care today including review of outside records, review of imaging, review of symptoms, physical exam, discussion of differential diagnosis, discussion of treatment options, and documentation.   Thank you for involving me in the care of this patient.   Drake Leach PA-C Dept. of Neurosurgery

## 2023-08-21 NOTE — Progress Notes (Unsigned)
 Referring Physician:  Marjie Skiff, NP [address] [place],  [place] [address]  Primary Physician:  Marjie Skiff, NP  History of Present Illness: 08/21/2023*** Ms. Jayd Forrey has a history of HTN, OSA, hyperlipidemia, DM, hyperthyroidism, osteopenia, obesity, gout.   Seen in ED on 08/14/23 for right anterior thigh pain.    She is taking flexeril, celebrex, cymbalta, and neurontin. Was given percocet, flexeril, and prednisone from ED.   Duration: *** Location: *** Quality: *** Severity: ***  Precipitating: aggravated by *** Modifying factors: made better by *** Weakness: none Timing: ***  She does not smoke.   Bowel/Bladder Dysfunction: none  Conservative measures:  Physical therapy: *** has not participated in? Multimodal medical therapy including regular antiinflammatories: *** celebrex, flexeril, cymbalta, gabapentin,  Injections: *** no epidural steroid injections?  Past Surgery: ***no spinal surgeries  [name] has ***no symptoms of cervical myelopathy.  The symptoms are causing a significant impact on the patient's life.   Review of Systems:  A 10 point review of systems is negative, except for the pertinent positives and negatives detailed in the HPI.  Past Medical History: Past Medical History:  Diagnosis Date   Chronic kidney disease 04/25/2015   Diabetes mellitus with renal manifestation (HCC)    Diabetes mellitus without complication (HCC)    Glaucoma    Gout    Hyperlipidemia    Hypertension    Hypertensive CKD (chronic kidney disease)    Neuropathy    feet   Obesity    Sleep apnea    CPAP    Past Surgical History: Past Surgical History:  Procedure Laterality Date   ABDOMINAL HYSTERECTOMY     BREAST CYST ASPIRATION     CHOLECYSTECTOMY     COLONOSCOPY WITH PROPOFOL N/A 02/01/2020   Procedure: COLONOSCOPY WITH PROPOFOL;  Surgeon: [name], MD;  Location: [facility];  Service: Endoscopy;  Laterality: N/A;   Diabetic - oral meds sleep apnea priority 4   HERNIA REPAIR     THUMB FUSION      Allergies: Allergies as of 08/22/2023 - Review Complete 08/14/2023  Allergen Reaction Noted   Niacin Itching 12/01/2014   Sulfa antibiotics Other (See Comments) 12/01/2014    Medications: Outpatient Encounter Medications as of 08/22/2023  Medication Sig   allopurinol (ZYLOPRIM) 300 MG tablet Take 1 tablet (300 mg total) by mouth daily.   aspirin 81 MG EC tablet Take 81 mg by mouth daily.    Blood Glucose Monitoring Suppl (TRUE METRIX AIR GLUCOSE METER) w/Device KIT Use to check blood sugar 2-3 times a day and document.  Bring log to visits.   celecoxib (CELEBREX) 100 MG capsule Take 1 capsule (100 mg total) by mouth 2 (two) times daily for 14 days.   Cholecalciferol 25 MCG (1000 UT) tablet Take 2,000 Units by mouth daily.    cyclobenzaprine (FLEXERIL) 5 MG tablet Take 1 tablet (5 mg total) by mouth 3 (three) times daily as needed.   DHA-EPA-Flaxseed Oil-Vitamin E (THERA TEARS NUTRITION PO) Take 1 mg by mouth 2 (two) times daily.    DULoxetine (CYMBALTA) 60 MG capsule Take 1 capsule (60 mg total) by mouth daily.   gabapentin (NEURONTIN) 300 MG capsule TAKE 2 CAPSULES EVERY MORNING, AND THEN 2 CAPSULES AT 2 PM, AND THEN TAKE 3 CAPSULES EVERY NIGHT BEFORE BEDTIME.   metFORMIN (GLUCOPHAGE-XR) 500 MG 24 hr tablet TAKE 2 TABLETS EVERY MORNING  AND TAKE 2 TABLETS AT BEDTIME   metoprolol succinate (TOPROL-XL) 50 MG  24 hr tablet Take 1 tablet (50 mg total) by mouth daily. Take with or immediately following a meal.   Multiple Vitamins-Minerals (EYE VITAMINS PO) Take 1 tablet by mouth daily.   rosuvastatin (CRESTOR) 20 MG tablet Take 1 tablet (20 mg total) by mouth daily.   TRUE METRIX BLOOD GLUCOSE TEST test strip TEST BLOOD SUGAR 2 TO 3 TIMES DAILY AND DOCUMENT. BRING LOG TO VISITS.   TRUEplus Lancets 33G MISC Use to check blood sugar 2 to 3 times a day and document. Bring log to visits.    valsartan-hydrochlorothiazide (DIOVAN-HCT) 80-12.5 MG tablet Take 1 tablet by mouth daily.   vitamin B-12 (CYANOCOBALAMIN) 1000 MCG tablet Take 1,000 mcg by mouth daily.   XIIDRA 5 % SOLN Place 1 drop into both eyes 2 (two) times daily.    No facility-administered encounter medications on file as of 08/22/2023.    Social History: Social History   Tobacco Use   Smoking status: Never   Smokeless tobacco: Never  Vaping Use   Vaping status: Never Used  Substance Use Topics   Alcohol use: No    Alcohol/week: 0.0 standard drinks of alcohol   Drug use: No    Family Medical History: Family History  Problem Relation Age of Onset   Diabetes Mother    Cancer Mother        Pituitary tumor   Hyperlipidemia Mother    Hypertension Mother    Thyroid disease Mother    Stroke Mother    Cancer Father        Lung   Cancer Brother        Oral   Cancer Maternal Grandmother        colon   Blindness Maternal Grandfather    Arthritis Paternal Grandmother    Sleep apnea Daughter    Arthritis Daughter        RA   Sleep apnea Daughter    Hypertension Son    Breast cancer Neg Hx     Physical Examination: There were no vitals filed for this visit.  General: Patient is well developed, well nourished, calm, collected, and in no apparent distress. Attention to examination is appropriate.  Respiratory: Patient is breathing without any difficulty.   NEUROLOGICAL:     Awake, alert, oriented to person, place, and time.  Speech is clear and fluent. Fund of knowledge is appropriate.   Cranial Nerves: Pupils equal round and reactive to light.  Facial tone is symmetric.    *** ROM of cervical spine *** pain *** posterior cervical tenderness. *** tenderness in bilateral trapezial region.   *** ROM of lumbar spine *** pain *** posterior lumbar tenderness.   No abnormal lesions on exposed skin.   Strength: Side Biceps Triceps Deltoid Interossei Grip Wrist Ext. Wrist Flex.  R 5 5 5 5 5 5 5    L 5 5 5 5 5 5 5    Side Iliopsoas Quads Hamstring PF DF EHL  R 5 5 5 5 5 5   L 5 5 5 5 5 5    Reflexes are ***2+ and symmetric at the biceps, brachioradialis, patella and achilles.   Hoffman's is absent.  Clonus is not present.   Bilateral upper and lower extremity sensation is intact to light touch.     Gait is normal.   ***No difficulty with tandem gait.    Medical Decision Making  Imaging: MRI lumbar spine dated 08/14/23:  FINDINGS: Segmentation: Standard. Lowest well-formed disc space labeled the L5-S1 level.  Alignment: Trace levoscoliosis. Trace 2 mm facet mediated anterolisthesis of L3 and L4 through L5 on S1. trace retrolisthesis of L1 on L2.   Vertebrae: Vertebral body height maintained without acute or chronic fracture. Bone marrow signal intensity within normal limits. No discrete or worrisome osseous lesions. Mild degenerate reactive endplate changes present about the T11-12 and L2-3 interspaces. No other abnormal marrow edema.   Conus medullaris and cauda equina: Conus extends to the L1 level. Conus and cauda equina appear normal.   Paraspinal and other soft tissues: Unremarkable.   Disc levels:   T11-12: Seen only on sagittal projection. Disc desiccation with mild disc bulge and reactive endplate spurring. No stenosis.   T12-L1: Unremarkable.   L1-2: Disc desiccation with mild diffuse disc bulge. Mild reactive endplate spurring. Mild bilateral facet hypertrophy. No spinal stenosis. Foramina remain patent.   L2-3: Mild diffuse disc bulge with disc desiccation, asymmetric to the right. Superimposed right subarticular disc extrusion with inferior migration (series 5, image 6). Secondary impingement of the descending right L3 nerve root (series 8, image 15). Superimposed mild bilateral facet hypertrophy. Resultant severe right lateral recess stenosis. Central canal remains patent. Mild bilateral L2 foraminal narrowing.   L3-4: Disc desiccation with mild  disc bulge. Moderate bilateral facet hypertrophy. Resultant mild narrowing of the lateral recesses bilaterally. Central canal remains patent. Mild bilateral L3 foraminal stenosis.   L4-5: Disc desiccation with mild diffuse disc bulge. Moderate bilateral facet hypertrophy. Resultant mild bilateral subarticular stenosis. Central canal remains patent. Mild bilateral L4 foraminal narrowing.   L5-S1: Disc desiccation with mild disc bulge. Moderate left worse than right facet hypertrophy. Resultant mild narrowing of the lateral recesses bilaterally. Central canal remains patent. Mild bilateral L5 foraminal stenosis.   IMPRESSION: 1. Right subarticular disc extrusion with inferior migration at L2-3, impinging upon the descending right L3 nerve root in the right lateral recess. 2. Additional mild multilevel degenerative spondylosis and facet arthrosis as above. Resultant mild bilateral subarticular stenosis at L3-4 through L5-S1. 3. Mild bilateral L2 through L5 foraminal stenosis related to disc bulge and facet hypertrophy.     Electronically Signed   By: Rise Mu M.D.   On: 08/14/2023 19:13  Lumbar xrays dated 08/14/23:  FINDINGS: Normal frontal alignment. Minimal retrolisthesis of L1 on L2, similar to prior CT.   Vertebral body heights are maintained. Minimal posterior L1-2 through L3-4 and minimal posterior L5-S1 disc space narrowing. Moderate L4-5 facet joint sclerosis and hypertrophy.   Hernia mesh coils overlie the right abdomen and pelvis. Right upper quadrant cholecystectomy clips.   IMPRESSION: 1. Minimal retrolisthesis of L1 on L2, unchanged. 2. Minimal posterior L1-2 through L3-4 and minimal posterior L5-S1 disc space narrowing. 3. 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     Electronically Signed   By: Neita Garnet M.D.   On: 08/14/2023 15:51    I have personally reviewed the images and agree with the above interpretation.  Assessment and  Plan: Ms. Low is a pleasant 78 y.o. female has ***  Treatment options discussed with patient and following plan made:   - Order for physical therapy for *** spine ***. Patient to call to schedule appointment. *** - Continue current medications including ***. Reviewed dosing and side effects.  - Prescription for ***. Reviewed dosing and side effects. Take with food.  - Prescription for *** to take prn muscle spasms. Reviewed dosing and side effects. Discussed this can cause drowsiness.  - MRI of *** to further evaluate *** radiculopathy. No improvement time or  medications (***).  - Referral to PMR at [facility] to discuss possible *** injections.  - Will schedule phone visit to review MRI results once I get them back.   I spent a total of *** minutes in face-to-face and non-face-to-face activities related to this patient's care today including review of outside records, review of imaging, review of symptoms, physical exam, discussion of differential diagnosis, discussion of treatment options, and documentation.   Thank you for involving me in the care of this patient.   Drake Leach PA-C Dept. of Neurosurgery

## 2023-08-22 ENCOUNTER — Ambulatory Visit: Admitting: Orthopedic Surgery

## 2023-08-22 ENCOUNTER — Encounter: Payer: Self-pay | Admitting: Orthopedic Surgery

## 2023-08-22 ENCOUNTER — Ambulatory Visit: Payer: Medicare HMO | Admitting: Neurosurgery

## 2023-08-22 ENCOUNTER — Other Ambulatory Visit: Payer: Self-pay

## 2023-08-22 VITALS — BP 116/74 | Ht 63.0 in | Wt 205.0 lb

## 2023-08-22 DIAGNOSIS — M5116 Intervertebral disc disorders with radiculopathy, lumbar region: Secondary | ICD-10-CM

## 2023-08-22 DIAGNOSIS — Z01818 Encounter for other preprocedural examination: Secondary | ICD-10-CM

## 2023-08-22 DIAGNOSIS — M47816 Spondylosis without myelopathy or radiculopathy, lumbar region: Secondary | ICD-10-CM

## 2023-08-22 DIAGNOSIS — R29898 Other symptoms and signs involving the musculoskeletal system: Secondary | ICD-10-CM

## 2023-08-22 DIAGNOSIS — M5416 Radiculopathy, lumbar region: Secondary | ICD-10-CM

## 2023-08-22 DIAGNOSIS — M5126 Other intervertebral disc displacement, lumbar region: Secondary | ICD-10-CM

## 2023-08-22 NOTE — Progress Notes (Signed)
 Referring Physician:  Marjie Skiff, NP [address] [place],  [place] [address]  Primary Physician:  Marjie Skiff, NP  History of Present Illness: 08/22/2023 Ms. [name] has a history of HTN, OSA, hyperlipidemia, DM, hyperthyroidism, osteopenia, obesity, gout.   Seen in ED on 08/14/23 for right anterior thigh pain.   She has 4 week history constant right lower back pain with right anterior thigh pain. She has known neuropathy with numbness and tingling in her legs- this is unchanged. She has increased weakness in right leg. Pain is worse with moving and better with medications, ice, and rest.   She is taking flexeril, celebrex, cymbalta, and neurontin. Was given percocet, flexeril, and prednisone from ED. She had some relief with these.   Last HgbA1c on 06/20/23 was 6.2.   She does not smoke.   Bowel/Bladder Dysfunction: none  Conservative measures:  Physical therapy:  has not participated in Multimodal medical therapy including regular antiinflammatories:  celebrex, flexeril, cymbalta, gabapentin, prednisone, percocet Injections:  no epidural steroid injections  Past Surgery: no spinal surgeries  [name] has no symptoms of cervical myelopathy.  The symptoms are causing a significant impact on the patient's life.   Review of Systems:  A 10 point review of systems is negative, except for the pertinent positives and negatives detailed in the HPI.  Past Medical History: Past Medical History:  Diagnosis Date   Chronic kidney disease 04/25/2015   Diabetes mellitus with renal manifestation (HCC)    Diabetes mellitus without complication (HCC)    Glaucoma    Gout    Hyperlipidemia    Hypertension    Hypertensive CKD (chronic kidney disease)    Neuropathy    feet   Obesity    Sleep apnea    CPAP    Past Surgical History: Past Surgical History:  Procedure Laterality Date   ABDOMINAL HYSTERECTOMY     BREAST CYST ASPIRATION     CHOLECYSTECTOMY      COLONOSCOPY WITH PROPOFOL N/A 02/01/2020   Procedure: COLONOSCOPY WITH PROPOFOL;  Surgeon: [name], MD;  Location: [facility];  Service: Endoscopy;  Laterality: N/A;  Diabetic - oral meds sleep apnea priority 4   HERNIA REPAIR     THUMB FUSION      Allergies: Allergies as of 08/22/2023 - Review Complete 08/22/2023  Allergen Reaction Noted   Niacin Itching 12/01/2014   Sulfa antibiotics Other (See Comments) 12/01/2014    Medications: Outpatient Encounter Medications as of 08/22/2023  Medication Sig   allopurinol (ZYLOPRIM) 300 MG tablet Take 1 tablet (300 mg total) by mouth daily.   aspirin 81 MG EC tablet Take 81 mg by mouth daily.    Blood Glucose Monitoring Suppl (TRUE METRIX AIR GLUCOSE METER) w/Device KIT Use to check blood sugar 2-3 times a day and document.  Bring log to visits.   celecoxib (CELEBREX) 100 MG capsule Take 1 capsule (100 mg total) by mouth 2 (two) times daily for 14 days.   Cholecalciferol 25 MCG (1000 UT) tablet Take 2,000 Units by mouth daily.    cyclobenzaprine (FLEXERIL) 5 MG tablet Take 1 tablet (5 mg total) by mouth 3 (three) times daily as needed.   DHA-EPA-Flaxseed Oil-Vitamin E (THERA TEARS NUTRITION PO) Take 1 mg by mouth 2 (two) times daily.    DULoxetine (CYMBALTA) 60 MG capsule Take 1 capsule (60 mg total) by mouth daily.   gabapentin (NEURONTIN) 300 MG capsule TAKE 2 CAPSULES EVERY MORNING, AND THEN 2 CAPSULES  AT 2 PM, AND THEN TAKE 3 CAPSULES EVERY NIGHT BEFORE BEDTIME.   metFORMIN (GLUCOPHAGE-XR) 500 MG 24 hr tablet TAKE 2 TABLETS EVERY MORNING  AND TAKE 2 TABLETS AT BEDTIME   metoprolol succinate (TOPROL-XL) 50 MG 24 hr tablet Take 1 tablet (50 mg total) by mouth daily. Take with or immediately following a meal.   Multiple Vitamins-Minerals (EYE VITAMINS PO) Take 1 tablet by mouth daily.   rosuvastatin (CRESTOR) 20 MG tablet Take 1 tablet (20 mg total) by mouth daily.   TRUE METRIX BLOOD GLUCOSE TEST test strip TEST BLOOD SUGAR 2 TO 3  TIMES DAILY AND DOCUMENT. BRING LOG TO VISITS.   TRUEplus Lancets 33G MISC Use to check blood sugar 2 to 3 times a day and document. Bring log to visits.   valsartan-hydrochlorothiazide (DIOVAN-HCT) 80-12.5 MG tablet Take 1 tablet by mouth daily.   vitamin B-12 (CYANOCOBALAMIN) 1000 MCG tablet Take 1,000 mcg by mouth daily.   XIIDRA 5 % SOLN Place 1 drop into both eyes 2 (two) times daily.    No facility-administered encounter medications on file as of 08/22/2023.    Social History: Social History   Tobacco Use   Smoking status: Never   Smokeless tobacco: Never  Vaping Use   Vaping status: Never Used  Substance Use Topics   Alcohol use: No    Alcohol/week: 0.0 standard drinks of alcohol   Drug use: No    Family Medical History: Family History  Problem Relation Age of Onset   Diabetes Mother    Cancer Mother        Pituitary tumor   Hyperlipidemia Mother    Hypertension Mother    Thyroid disease Mother    Stroke Mother    Cancer Father        Lung   Cancer Brother        Oral   Cancer Maternal Grandmother        colon   Blindness Maternal Grandfather    Arthritis Paternal Grandmother    Sleep apnea Daughter    Arthritis Daughter        RA   Sleep apnea Daughter    Hypertension Son    Breast cancer Neg Hx     Physical Examination: Vitals:   08/22/23 0906  BP: 116/74    General: Patient is well developed, well nourished, calm, collected, and in no apparent distress. Attention to examination is appropriate.  Respiratory: Patient is breathing without any difficulty.   NEUROLOGICAL:     Awake, alert, oriented to person, place, and time.  Speech is clear and fluent. Fund of knowledge is appropriate.   Cranial Nerves: Pupils equal round and reactive to light.  Facial tone is symmetric.    Diffuse right > left sided posterior lumbar tenderness.   No abnormal lesions on exposed skin.   Strength: Side Biceps Triceps Deltoid Interossei Grip Wrist Ext. Wrist  Flex.  R 5 5 5 5 5 5 5   L 5 5 5 5 5 5 5    Side Iliopsoas Quads Hamstring PF DF EHL  R 4- 5 5 5 5 5   L 5 5 5 5 5 5    Reflexes are 2+ and symmetric at the biceps, brachioradialis, patella and achilles.   Hoffman's is absent.  Clonus is not present.   Bilateral upper and lower extremity sensation is intact to light touch.     She has slow gait. Ambulates with a cane.   Medical Decision Making  Imaging: MRI lumbar  spine dated 08/14/23:  FINDINGS: Segmentation: Standard. Lowest well-formed disc space labeled the L5-S1 level.   Alignment: Trace levoscoliosis. Trace 2 mm facet mediated anterolisthesis of L3 and L4 through L5 on S1. trace retrolisthesis of L1 on L2.   Vertebrae: Vertebral body height maintained without acute or chronic fracture. Bone marrow signal intensity within normal limits. No discrete or worrisome osseous lesions. Mild degenerate reactive endplate changes present about the T11-12 and L2-3 interspaces. No other abnormal marrow edema.   Conus medullaris and cauda equina: Conus extends to the L1 level. Conus and cauda equina appear normal.   Paraspinal and other soft tissues: Unremarkable.   Disc levels:   T11-12: Seen only on sagittal projection. Disc desiccation with mild disc bulge and reactive endplate spurring. No stenosis.   T12-L1: Unremarkable.   L1-2: Disc desiccation with mild diffuse disc bulge. Mild reactive endplate spurring. Mild bilateral facet hypertrophy. No spinal stenosis. Foramina remain patent.   L2-3: Mild diffuse disc bulge with disc desiccation, asymmetric to the right. Superimposed right subarticular disc extrusion with inferior migration (series 5, image 6). Secondary impingement of the descending right L3 nerve root (series 8, image 15). Superimposed mild bilateral facet hypertrophy. Resultant severe right lateral recess stenosis. Central canal remains patent. Mild bilateral L2 foraminal narrowing.   L3-4: Disc desiccation  with mild disc bulge. Moderate bilateral facet hypertrophy. Resultant mild narrowing of the lateral recesses bilaterally. Central canal remains patent. Mild bilateral L3 foraminal stenosis.   L4-5: Disc desiccation with mild diffuse disc bulge. Moderate bilateral facet hypertrophy. Resultant mild bilateral subarticular stenosis. Central canal remains patent. Mild bilateral L4 foraminal narrowing.   L5-S1: Disc desiccation with mild disc bulge. Moderate left worse than right facet hypertrophy. Resultant mild narrowing of the lateral recesses bilaterally. Central canal remains patent. Mild bilateral L5 foraminal stenosis.   IMPRESSION: 1. Right subarticular disc extrusion with inferior migration at L2-3, impinging upon the descending right L3 nerve root in the right lateral recess. 2. Additional mild multilevel degenerative spondylosis and facet arthrosis as above. Resultant mild bilateral subarticular stenosis at L3-4 through L5-S1. 3. Mild bilateral L2 through L5 foraminal stenosis related to disc bulge and facet hypertrophy.     Electronically Signed   By: Rise Mu M.D.   On: 08/14/2023 19:13  Lumbar xrays dated 08/14/23:  FINDINGS: Normal frontal alignment. Minimal retrolisthesis of L1 on L2, similar to prior CT.   Vertebral body heights are maintained. Minimal posterior L1-2 through L3-4 and minimal posterior L5-S1 disc space narrowing. Moderate L4-5 facet joint sclerosis and hypertrophy.   Hernia mesh coils overlie the right abdomen and pelvis. Right upper quadrant cholecystectomy clips.   IMPRESSION: 1. Minimal retrolisthesis of L1 on L2, unchanged. 2. Minimal posterior L1-2 through L3-4 and minimal posterior L5-S1 disc space narrowing. 3. 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     Electronically Signed   By: Neita Garnet M.D.   On: 08/14/2023 15:51    I have personally reviewed the images and agree with the above interpretation.  Assessment  and Plan: Ms. Vater has a 4 week history constant right lower back pain with right anterior thigh pain. She has known neuropathy with numbness and tingling in her legs- this is unchanged. She has increased weakness in right leg.  She has known disc at L2-L3 with extrusion and inferior migration impinging on right L3 nerve. 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   She has weakness in right iliopsoas on exam.   Treatment options discussed  with patient and following plan made:   - Due to her right leg weakness, I recommend she see Dr. Myer Haff to discuss further options.  - She will see him today at 10:30.   I spent a total of 35 minutes in face-to-face and non-face-to-face activities related to this patient's care today including review of outside records, review of imaging, review of symptoms, physical exam, discussion of differential diagnosis, discussion of treatment options, and documentation.   Thank you for involving me in the care of this patient.   Drake Leach PA-C Dept. of Neurosurgery

## 2023-08-22 NOTE — Patient Instructions (Signed)
 Please see below for information in regards to your upcoming surgery:   Planned surgery: Right L2-3 microdiscectomy   Surgery date: 08/26/23 at [facility]: [address], [place], [place] [address]) - you will find out your arrival time the business day before your surgery.   Pre-op appointment at [facility] Pre-admit Testing: we will call you with a date/time for this. If you are scheduled for an in person appointment, Pre-admit Testing is located on the first floor of the [facility] building, [address] [facility], [address]. Please bring all prescriptions in the original prescription bottles to your appointment. During this appointment, they will advise you which medications you can take the morning of surgery, and which medications you will need to hold for surgery. Labs (such as blood work, EKG) may be done at your pre-op appointment. You are not required to fast for these labs. Should you need to change your pre-op appointment, please call Pre-admit testing at [phone].     Blood thinners:   Aspirin 81mg :  stop aspirin (already took dose on 3/6, do not take anymore), can resume aspirin 14 days after    Diabetes/weight loss medications: Per anesthesia guidelines (due to the increased risk of aspiration caused by delayed gastric emptying):  Metformin: hold for 2 days prior to surgery    Common restrictions after surgery: No bending, lifting, or twisting ("BLT"). Avoid lifting objects heavier than 10 pounds for the first 6 weeks after surgery. Where possible, avoid household activities that involve lifting, bending, reaching, pushing, or pulling such as laundry, vacuuming, grocery shopping, and childcare. Try to arrange for help from friends and family for these activities while you heal. Do not drive while taking prescription pain medication. Weeks 6 through 12 after surgery: avoid lifting more than 25 pounds.     How to contact  us:  If you have any questions/concerns before or after surgery, you can reach [place] at [phone], or you can send a mychart message. We can be reached by phone or mychart 8am-4pm, Monday-Friday.  *Please note: Calls after 4pm are forwarded to a third party answering service. Mychart messages are not routinely monitored during evenings, weekends, and holidays. Please call our office to contact the answering service for urgent concerns during non-business hours.    If you have FMLA/disability paperwork, please drop it off or fax it to [phone], attention Patty.   Appointments/FMLA & disability paperwork: Joycelyn Rua, & Flonnie Hailstone Registered Nurse/Surgery scheduler: Royston Cowper Medical Assistants: Nash Mantis Physician Assistants: Joan Flores, PA-C, Manning Charity, PA-C & Drake Leach, PA-C Surgeons: Venetia Night, MD & Ernestine Mcmurray, MD

## 2023-08-22 NOTE — Addendum Note (Signed)
 Addended by: Sharlot Gowda on: 08/22/2023 11:15 AM   Modules accepted: Orders

## 2023-08-23 ENCOUNTER — Encounter
Admission: RE | Admit: 2023-08-23 | Discharge: 2023-08-23 | Disposition: A | Discharge status: Home / Self Care | Source: Ambulatory Visit | Attending: Neurosurgery | Admitting: Neurosurgery

## 2023-08-23 ENCOUNTER — Telehealth: Payer: Self-pay | Admitting: Neurosurgery

## 2023-08-23 ENCOUNTER — Encounter: Payer: Self-pay | Admitting: Neurosurgery

## 2023-08-23 ENCOUNTER — Other Ambulatory Visit: Payer: Self-pay

## 2023-08-23 VITALS — BP 143/75 | HR 96 | Resp 18 | Ht 63.0 in | Wt 208.3 lb

## 2023-08-23 DIAGNOSIS — I10 Essential (primary) hypertension: Secondary | ICD-10-CM | POA: Insufficient documentation

## 2023-08-23 DIAGNOSIS — M5126 Other intervertebral disc displacement, lumbar region: Secondary | ICD-10-CM

## 2023-08-23 DIAGNOSIS — R29898 Other symptoms and signs involving the musculoskeletal system: Secondary | ICD-10-CM

## 2023-08-23 DIAGNOSIS — E119 Type 2 diabetes mellitus without complications: Secondary | ICD-10-CM | POA: Insufficient documentation

## 2023-08-23 DIAGNOSIS — M5416 Radiculopathy, lumbar region: Secondary | ICD-10-CM

## 2023-08-23 DIAGNOSIS — Z01812 Encounter for preprocedural laboratory examination: Secondary | ICD-10-CM

## 2023-08-23 DIAGNOSIS — Z01818 Encounter for other preprocedural examination: Secondary | ICD-10-CM | POA: Insufficient documentation

## 2023-08-23 DIAGNOSIS — M47816 Spondylosis without myelopathy or radiculopathy, lumbar region: Secondary | ICD-10-CM

## 2023-08-23 HISTORY — DX: Unspecified osteoarthritis, unspecified site: M19.90

## 2023-08-23 HISTORY — DX: Nontoxic multinodular goiter: E04.2

## 2023-08-23 HISTORY — DX: Radiculopathy, cervical region: M54.12

## 2023-08-23 LAB — BASIC METABOLIC PANEL
Anion gap: 13 (ref 5–15)
BUN: 19 mg/dL (ref 8–23)
CO2: 27 mmol/L (ref 22–32)
Calcium: 9.6 mg/dL (ref 8.9–10.3)
Chloride: 98 mmol/L (ref 98–111)
Creatinine, Ser: 0.67 mg/dL (ref 0.44–1.00)
GFR, Estimated: >60 mL/min (ref >60)
Glucose, Bld: 87 mg/dL (ref 70–99)
Potassium: 3.9 mmol/L (ref 3.5–5.1)
Sodium: 138 mmol/L (ref 135–145)

## 2023-08-23 LAB — TYPE AND SCREEN
ABO/RH(D): A POS
Antibody Screen: NEGATIVE

## 2023-08-23 LAB — URINALYSIS, COMPLETE (UACMP) WITH MICROSCOPIC
Bilirubin Urine: NEGATIVE
Glucose, UA: NEGATIVE mg/dL
Hgb urine dipstick: NEGATIVE
Ketones, ur: NEGATIVE mg/dL
Nitrite: NEGATIVE
Protein, ur: NEGATIVE mg/dL
RBC / HPF: 0 RBC/hpf (ref 0–5)
Specific Gravity, Urine: 1.011 (ref 1.005–1.030)
pH: 6 (ref 5.0–8.0)

## 2023-08-23 LAB — SURGICAL PCR SCREEN
MRSA, PCR: NEGATIVE
Staphylococcus aureus: NEGATIVE

## 2023-08-23 LAB — CBC
HCT: 42.2 % (ref 36.0–46.0)
Hemoglobin: 13.8 g/dL (ref 12.0–15.0)
MCH: 27.8 pg (ref 26.0–34.0)
MCHC: 32.7 g/dL (ref 30.0–36.0)
MCV: 85.1 fL (ref 80.0–100.0)
Platelets: 317 10*3/uL (ref 150–400)
RBC: 4.96 MIL/uL (ref 3.87–5.11)
RDW: 14.8 % (ref 11.5–15.5)
WBC: 7.9 10*3/uL (ref 4.0–10.5)
nRBC: 0 % (ref 0.0–0.2)

## 2023-08-23 MED ORDER — OXYCODONE-ACETAMINOPHEN 5-325 MG PO TABS: 1.0000 | ORAL_TABLET | Freq: Three times a day (TID) | ORAL | 0 refills | Status: DC | PRN

## 2023-08-23 NOTE — Telephone Encounter (Signed)
 Patient's daughter, Mindi Junker is calling to request a refill of Percocet to be sent to [facility] in [place]. The daughter states that she only has two left and needs some to get her through the weekend until she has surgery on Monday.

## 2023-08-23 NOTE — Telephone Encounter (Signed)
They also sent a my chart message

## 2023-08-23 NOTE — Addendum Note (Signed)
 Addended byDrake Leach on: 08/23/2023 10:54 AM   Modules accepted: Orders

## 2023-08-23 NOTE — Telephone Encounter (Signed)
 Tried to call patient and her daughter and got their voicemails. Will send MyChart message.

## 2023-08-23 NOTE — Telephone Encounter (Signed)
 Refill okay. PMP reviewed. She is scheduled for surgery on Monday. Will take least amount possible this weekend.

## 2023-08-23 NOTE — Patient Instructions (Addendum)
 Your procedure is scheduled on: 08/26/23 - Monday Report to the Registration Desk on the 1st floor of the [facility]. To find out your arrival time, please call [phone] between 1PM - 3PM on: 08/23/23 - Friday If your arrival time is 6:00 am, do not arrive before that time as the [facility] entrance doors do not open until 6:00 am.  REMEMBER: Instructions that are not followed completely may result in serious medical risk, up to and including death; or upon the discretion of your surgeon and anesthesiologist your surgery may need to be rescheduled.  Do not eat food after midnight the night before surgery.  No gum chewing or hard candies.  You may however, drink CLEAR liquids up to 2 hours before you are scheduled to arrive for your surgery. Do not drink anything within 2 hours of your scheduled arrival time.  Clear liquids include: - water   You may continue these if needed,  Anti-inflammatories (NSAIDS) such as Advil, Aleve, Ibuprofen, Motrin, Naproxen, Naprosyn and Aspirin based products such as Excedrin, Goody's Powder, BC Powder.  Stop ANY OVER THE COUNTER supplements until after surgery : Cholecalciferol ,Multiple Vitamins-Minerals THERA TEARS NUTRITION ,  You may take Tylenol if needed for pain up until the day of surgery.  We have instructed you to hold your blood thinner(s) and/or diabetes medications for surgery as listed below: Blood thinners:  Aspirin 81mg :  stop aspirin (already took dose on 3/6, do not take anymore), can resume aspirin 14 days after   Diabetes medications: Metformin: hold for 2 days prior to surgery beginning 08/24/23.  HOLD valsartan-hydrochlorothiazide on the day of surgery.   ON THE DAY OF SURGERY ONLY TAKE THESE MEDICATIONS WITH SIPS OF WATER:  allopurinol (ZYLOPRIM)  celecoxib (CELEBREX)  gabapentin (NEURONTIN)  oxyCODONE-acetaminophen if needed cyclobenzaprine (FLEXERIL) if needed DULoxetine (CYMBALTA)   Bring your CPAP with you  to the hospital of the day of surgery.   No Alcohol for 24 hours before or after surgery.  No Smoking including e-cigarettes for 24 hours before surgery.  No chewable tobacco products for at least 6 hours before surgery.  No nicotine patches on the day of surgery.  Do not use any "recreational" drugs for at least a week (preferably 2 weeks) before your surgery.  Please be advised that the combination of cocaine and anesthesia may have negative outcomes, up to and including death. If you test positive for cocaine, your surgery will be cancelled.  On the morning of surgery brush your teeth with toothpaste and water, you may rinse your mouth with mouthwash if you wish. Do not swallow any toothpaste or mouthwash.  Use CHG Soap or wipes as directed on instruction sheet.  Do not wear jewelry, make-up, hairpins, clips or nail polish.  For welded (permanent) jewelry: bracelets, anklets, waist bands, etc.  Please have this removed prior to surgery.  If it is not removed, there is a chance that hospital personnel will need to cut it off on the day of surgery.  Do not wear lotions, powders, or perfumes.   Do not shave body hair from the neck down 48 hours before surgery.  Contact lenses, hearing aids and dentures may not be worn into surgery.  Do not bring valuables to the hospital. [facility] is not responsible for any missing/lost belongings or valuables.   Notify your doctor if there is any change in your medical condition (cold, fever, infection).  Wear comfortable clothing (specific to your surgery type) to the hospital.  After surgery, you can help prevent lung complications by doing breathing exercises.  Take deep breaths and cough every 1-2 hours. Your doctor may order a device called an Incentive Spirometer to help you take deep breaths. When coughing or sneezing, hold a pillow firmly against your incision with both hands. This is called "splinting." Doing this helps protect your  incision. It also decreases belly discomfort.  If you are being admitted to the hospital overnight, leave your suitcase in the car. After surgery it may be brought to your room.  In case of increased patient census, it may be necessary for you, the patient, to continue your postoperative care in the Same Day Surgery department.  If you are being discharged the day of surgery, you will not be allowed to drive home. You will need a responsible individual to drive you home and stay with you for 24 hours after surgery.   If you are taking public transportation, you will need to have a responsible individual with you.  Please call the Pre-admissions Testing Dept. at [phone] if you have any questions about these instructions.  Surgery Visitation Policy:  Patients having surgery or a procedure may have two visitors.  Children under the age of 32 must have an adult with them who is not the patient.  Temporary Visitor Restrictions Due to increasing cases of flu, RSV and COVID-19: Children ages 64 and under will not be able to visit patients in [facility] hospitals under most circumstances.  Inpatient Visitation:    Visiting hours are 7 a.m. to 8 p.m. Up to four visitors are allowed at one time in a patient room. The visitors may rotate out with other people during the day.  One visitor age 8 or older may stay with the patient overnight and must be in the room by 8 p.m.    Pre-operative 5 CHG Bath Instructions   You can play a key role in reducing the risk of infection after surgery. Your skin needs to be as free of germs as possible. You can reduce the number of germs on your skin by washing with CHG (chlorhexidine gluconate) soap before surgery. CHG is an antiseptic soap that kills germs and continues to kill germs even after washing.   DO NOT use if you have an allergy to chlorhexidine/CHG or antibacterial soaps. If your skin becomes reddened or irritated, stop using the CHG and  notify one of our RNs at [phone].   Please shower with the CHG soap starting 4 days before surgery using the following schedule:     Please keep in mind the following:  DO NOT shave, including legs and underarms, starting the day of your first shower.   You may shave your face at any point before/day of surgery.  Place clean sheets on your bed the day you start using CHG soap. Use a clean washcloth (not used since being washed) for each shower. DO NOT sleep with pets once you start using the CHG.   CHG Shower Instructions:  If you choose to wash your hair and private area, wash first with your normal shampoo/soap.  After you use shampoo/soap, rinse your hair and body thoroughly to remove shampoo/soap residue.  Turn the water OFF and apply about 3 tablespoons (45 ml) of CHG soap to a CLEAN washcloth.  Apply CHG soap ONLY FROM YOUR NECK DOWN TO YOUR TOES (washing for 3-5 minutes)  DO NOT use CHG soap on face, private areas, open wounds, or sores.  Pay special attention to the area where your surgery is being performed.  If you are having back surgery, having someone wash your back for you may be helpful. Wait 2 minutes after CHG soap is applied, then you may rinse off the CHG soap.  Pat dry with a clean towel  Put on clean clothes/pajamas   If you choose to wear lotion, please use ONLY the CHG-compatible lotions on the back of this paper.     Additional instructions for the day of surgery: DO NOT APPLY any lotions, deodorants, cologne, or perfumes.   Put on clean/comfortable clothes.  Brush your teeth.  Ask your nurse before applying any prescription medications to the skin.      CHG Compatible Lotions   Aveeno Moisturizing lotion  Cetaphil Moisturizing Cream  Cetaphil Moisturizing Lotion  Clairol Herbal Essence Moisturizing Lotion, Dry Skin  Clairol Herbal Essence Moisturizing Lotion, Extra Dry Skin  Clairol Herbal Essence Moisturizing Lotion, Normal Skin  Curel Age  Defying Therapeutic Moisturizing Lotion with Alpha Hydroxy  Curel Extreme Care Body Lotion  Curel Soothing Hands Moisturizing Hand Lotion  Curel Therapeutic Moisturizing Cream, Fragrance-Free  Curel Therapeutic Moisturizing Lotion, Fragrance-Free  Curel Therapeutic Moisturizing Lotion, Original Formula  Eucerin Daily Replenishing Lotion  Eucerin Dry Skin Therapy Plus Alpha Hydroxy Crme  Eucerin Dry Skin Therapy Plus Alpha Hydroxy Lotion  Eucerin Original Crme  Eucerin Original Lotion  Eucerin Plus Crme Eucerin Plus Lotion  Eucerin TriLipid Replenishing Lotion  Keri Anti-Bacterial Hand Lotion  Keri Deep Conditioning Original Lotion Dry Skin Formula Softly Scented  Keri Deep Conditioning Original Lotion, Fragrance Free Sensitive Skin Formula  Keri Lotion Fast Absorbing Fragrance Free Sensitive Skin Formula  Keri Lotion Fast Absorbing Softly Scented Dry Skin Formula  Keri Original Lotion  Keri Skin Renewal Lotion Keri Silky Smooth Lotion  Keri Silky Smooth Sensitive Skin Lotion  Nivea Body Creamy Conditioning Oil  Nivea Body Extra Enriched Teacher, adult education Moisturizing Lotion Nivea Crme  Nivea Skin Firming Lotion  NutraDerm 30 Skin Lotion  NutraDerm Skin Lotion  NutraDerm Therapeutic Skin Cream  NutraDerm Therapeutic Skin Lotion  ProShield Protective Hand Cream  Provon moisturizing lotion

## 2023-08-23 NOTE — Telephone Encounter (Signed)
Handled via mychart message.

## 2023-08-26 ENCOUNTER — Ambulatory Visit

## 2023-08-26 ENCOUNTER — Ambulatory Visit: Payer: Self-pay | Admitting: Urgent Care

## 2023-08-26 ENCOUNTER — Other Ambulatory Visit: Payer: Self-pay

## 2023-08-26 ENCOUNTER — Ambulatory Visit: Admitting: Registered Nurse

## 2023-08-26 ENCOUNTER — Encounter
Admission: RE | Disposition: A | Discharge status: Home / Self Care | Payer: Self-pay | Source: Home / Self Care | Attending: Neurosurgery

## 2023-08-26 ENCOUNTER — Ambulatory Visit
Admission: RE | Admit: 2023-08-26 | Discharge: 2023-08-26 | Disposition: A | Discharge status: Home / Self Care | Attending: Neurosurgery | Admitting: Neurosurgery

## 2023-08-26 ENCOUNTER — Encounter: Payer: Self-pay | Admitting: Neurosurgery

## 2023-08-26 DIAGNOSIS — I129 Hypertensive chronic kidney disease with stage 1 through stage 4 chronic kidney disease, or unspecified chronic kidney disease: Secondary | ICD-10-CM | POA: Diagnosis not present

## 2023-08-26 DIAGNOSIS — E1122 Type 2 diabetes mellitus with diabetic chronic kidney disease: Secondary | ICD-10-CM | POA: Insufficient documentation

## 2023-08-26 DIAGNOSIS — Z01818 Encounter for other preprocedural examination: Secondary | ICD-10-CM

## 2023-08-26 DIAGNOSIS — E785 Hyperlipidemia, unspecified: Secondary | ICD-10-CM | POA: Diagnosis not present

## 2023-08-26 DIAGNOSIS — Z79899 Other long term (current) drug therapy: Secondary | ICD-10-CM | POA: Insufficient documentation

## 2023-08-26 DIAGNOSIS — M5116 Intervertebral disc disorders with radiculopathy, lumbar region: Secondary | ICD-10-CM | POA: Diagnosis not present

## 2023-08-26 DIAGNOSIS — N189 Chronic kidney disease, unspecified: Secondary | ICD-10-CM | POA: Diagnosis not present

## 2023-08-26 DIAGNOSIS — M5416 Radiculopathy, lumbar region: Secondary | ICD-10-CM | POA: Diagnosis not present

## 2023-08-26 DIAGNOSIS — E1140 Type 2 diabetes mellitus with diabetic neuropathy, unspecified: Secondary | ICD-10-CM | POA: Diagnosis not present

## 2023-08-26 DIAGNOSIS — M1990 Unspecified osteoarthritis, unspecified site: Secondary | ICD-10-CM | POA: Insufficient documentation

## 2023-08-26 DIAGNOSIS — Z9889 Other specified postprocedural states: Secondary | ICD-10-CM

## 2023-08-26 DIAGNOSIS — M109 Gout, unspecified: Secondary | ICD-10-CM | POA: Diagnosis not present

## 2023-08-26 DIAGNOSIS — G4733 Obstructive sleep apnea (adult) (pediatric): Secondary | ICD-10-CM | POA: Insufficient documentation

## 2023-08-26 DIAGNOSIS — R29898 Other symptoms and signs involving the musculoskeletal system: Secondary | ICD-10-CM | POA: Diagnosis not present

## 2023-08-26 DIAGNOSIS — E119 Type 2 diabetes mellitus without complications: Secondary | ICD-10-CM

## 2023-08-26 DIAGNOSIS — Z7984 Long term (current) use of oral hypoglycemic drugs: Secondary | ICD-10-CM | POA: Diagnosis not present

## 2023-08-26 DIAGNOSIS — Z419 Encounter for procedure for purposes other than remedying health state, unspecified: Secondary | ICD-10-CM

## 2023-08-26 HISTORY — PX: LUMBAR LAMINECTOMY/DECOMPRESSION MICRODISCECTOMY: SHX5026

## 2023-08-26 LAB — GLUCOSE, CAPILLARY
Glucose-Capillary: 129 mg/dL — ABNORMAL HIGH (ref 70–99)
Glucose-Capillary: 154 mg/dL — ABNORMAL HIGH (ref 70–99)

## 2023-08-26 LAB — ABO/RH: ABO/RH(D): A POS

## 2023-08-26 SURGERY — LUMBAR LAMINECTOMY/DECOMPRESSION MICRODISCECTOMY 1 LEVEL
Anesthesia: General | Laterality: Right

## 2023-08-26 MED ORDER — SODIUM CHLORIDE (PF) 0.9 % IJ SOLN
INTRAMUSCULAR | Status: DC | PRN
  Administered 2023-08-26: 60 mL

## 2023-08-26 MED ORDER — OXYCODONE HCL 5 MG PO TABS
5.0000 mg | ORAL_TABLET | ORAL | 0 refills | Status: DC | PRN
  Filled 2023-08-26: qty 30, 5d supply, fill #0

## 2023-08-26 MED ORDER — CHLORHEXIDINE GLUCONATE 0.12 % MT SOLN
15.0000 mL | Freq: Once | OROMUCOSAL | Status: AC
  Administered 2023-08-26: 15 mL via OROMUCOSAL

## 2023-08-26 MED ORDER — PHENYLEPHRINE 80 MCG/ML (10ML) SYRINGE FOR IV PUSH (FOR BLOOD PRESSURE SUPPORT)
PREFILLED_SYRINGE | INTRAVENOUS | Status: DC | PRN
  Administered 2023-08-26: 120 ug via INTRAVENOUS
  Administered 2023-08-26: 80 ug via INTRAVENOUS

## 2023-08-26 MED ORDER — OXYCODONE HCL 5 MG/5ML PO SOLN: 5.0000 mg | Freq: Once | ORAL | Status: DC | PRN

## 2023-08-26 MED ORDER — SENNA 8.6 MG PO TABS
1.0000 | ORAL_TABLET | Freq: Two times a day (BID) | ORAL | 0 refills | Status: AC | PRN
  Filled 2023-08-26: qty 30, 15d supply, fill #0

## 2023-08-26 MED ORDER — BUPIVACAINE LIPOSOME 1.3 % IJ SUSP
INTRAMUSCULAR | Status: AC
  Filled 2023-08-26: qty 20

## 2023-08-26 MED ORDER — EPHEDRINE 5 MG/ML INJ
INTRAVENOUS | Status: AC
  Filled 2023-08-26: qty 5

## 2023-08-26 MED ORDER — BUPIVACAINE-EPINEPHRINE (PF) 0.5% -1:200000 IJ SOLN
INTRAMUSCULAR | Status: AC
  Filled 2023-08-26: qty 20

## 2023-08-26 MED ORDER — PROPOFOL 10 MG/ML IV BOLUS
INTRAVENOUS | Status: AC
  Filled 2023-08-26: qty 20

## 2023-08-26 MED ORDER — SODIUM CHLORIDE 0.9% FLUSH: 3.0000 mL | Freq: Two times a day (BID) | INTRAVENOUS | Status: DC

## 2023-08-26 MED ORDER — FENTANYL CITRATE (PF) 100 MCG/2ML IJ SOLN
INTRAMUSCULAR | Status: DC | PRN
  Administered 2023-08-26: 50 ug via INTRAVENOUS

## 2023-08-26 MED ORDER — ACETAMINOPHEN 10 MG/ML IV SOLN: 1000.0000 mg | Freq: Once | INTRAVENOUS | Status: DC | PRN

## 2023-08-26 MED ORDER — BUPIVACAINE-EPINEPHRINE (PF) 0.5% -1:200000 IJ SOLN
INTRAMUSCULAR | Status: DC | PRN
  Administered 2023-08-26: 4 mL via PERINEURAL

## 2023-08-26 MED ORDER — DROPERIDOL 2.5 MG/ML IJ SOLN: 0.6250 mg | Freq: Once | INTRAMUSCULAR | Status: DC | PRN

## 2023-08-26 MED ORDER — LIDOCAINE HCL (PF) 2 % IJ SOLN
INTRAMUSCULAR | Status: DC | PRN
  Administered 2023-08-26: 100 mg via INTRADERMAL

## 2023-08-26 MED ORDER — FENTANYL CITRATE (PF) 100 MCG/2ML IJ SOLN: 25.0000 ug | INTRAMUSCULAR | Status: DC | PRN

## 2023-08-26 MED ORDER — SODIUM CHLORIDE (PF) 0.9 % IJ SOLN
INTRAMUSCULAR | Status: AC
  Filled 2023-08-26: qty 20

## 2023-08-26 MED ORDER — KETAMINE HCL 50 MG/5ML IJ SOSY
PREFILLED_SYRINGE | INTRAMUSCULAR | Status: DC | PRN
  Administered 2023-08-26: 20 mg via INTRAVENOUS

## 2023-08-26 MED ORDER — FENTANYL CITRATE (PF) 100 MCG/2ML IJ SOLN
INTRAMUSCULAR | Status: AC
  Filled 2023-08-26: qty 2

## 2023-08-26 MED ORDER — PHENYLEPHRINE HCL-NACL 20-0.9 MG/250ML-% IV SOLN
INTRAVENOUS | Status: DC | PRN
  Administered 2023-08-26: 40 ug/min via INTRAVENOUS

## 2023-08-26 MED ORDER — SUCCINYLCHOLINE CHLORIDE 200 MG/10ML IV SOSY
PREFILLED_SYRINGE | INTRAVENOUS | Status: DC | PRN
  Administered 2023-08-26: 100 mg via INTRAVENOUS

## 2023-08-26 MED ORDER — ONDANSETRON HCL 4 MG/2ML IJ SOLN
INTRAMUSCULAR | Status: DC | PRN
  Administered 2023-08-26: 4 mg via INTRAVENOUS

## 2023-08-26 MED ORDER — PHENYLEPHRINE 80 MCG/ML (10ML) SYRINGE FOR IV PUSH (FOR BLOOD PRESSURE SUPPORT)
PREFILLED_SYRINGE | INTRAVENOUS | Status: AC
  Filled 2023-08-26: qty 10

## 2023-08-26 MED ORDER — PROPOFOL 500 MG/50ML IV EMUL
INTRAVENOUS | Status: DC | PRN
  Administered 2023-08-26: 75 ug/kg/min via INTRAVENOUS

## 2023-08-26 MED ORDER — EPHEDRINE SULFATE-NACL 50-0.9 MG/10ML-% IV SOSY
PREFILLED_SYRINGE | INTRAVENOUS | Status: DC | PRN
  Administered 2023-08-26: 10 mg via INTRAVENOUS
  Administered 2023-08-26: 5 mg via INTRAVENOUS

## 2023-08-26 MED ORDER — PROPOFOL 10 MG/ML IV BOLUS
INTRAVENOUS | Status: DC | PRN
  Administered 2023-08-26: 150 mg via INTRAVENOUS

## 2023-08-26 MED ORDER — 0.9 % SODIUM CHLORIDE (POUR BTL) OPTIME
TOPICAL | Status: DC | PRN
  Administered 2023-08-26: 500 mL

## 2023-08-26 MED ORDER — ORAL CARE MOUTH RINSE: 15.0000 mL | Freq: Once | OROMUCOSAL | Status: AC

## 2023-08-26 MED ORDER — ONDANSETRON HCL 4 MG/2ML IJ SOLN
INTRAMUSCULAR | Status: AC
  Filled 2023-08-26: qty 2

## 2023-08-26 MED ORDER — OXYCODONE HCL 5 MG PO TABS: 5.0000 mg | ORAL_TABLET | Freq: Once | ORAL | Status: DC | PRN

## 2023-08-26 MED ORDER — MIDAZOLAM HCL 2 MG/2ML IJ SOLN
INTRAMUSCULAR | Status: DC | PRN
  Administered 2023-08-26: 2 mg via INTRAVENOUS

## 2023-08-26 MED ORDER — CEFAZOLIN SODIUM-DEXTROSE 2-4 GM/100ML-% IV SOLN
INTRAVENOUS | Status: AC
  Filled 2023-08-26: qty 100

## 2023-08-26 MED ORDER — BUPIVACAINE HCL (PF) 0.5 % IJ SOLN
INTRAMUSCULAR | Status: AC
Start: 2023-08-26 — End: ?
  Filled 2023-08-26: qty 30

## 2023-08-26 MED ORDER — MIDAZOLAM HCL 2 MG/2ML IJ SOLN
INTRAMUSCULAR | Status: AC
Start: 2023-08-26 — End: ?
  Filled 2023-08-26: qty 2

## 2023-08-26 MED ORDER — METHYLPREDNISOLONE ACETATE 40 MG/ML IJ SUSP
INTRAMUSCULAR | Status: AC
  Filled 2023-08-26: qty 1

## 2023-08-26 MED ORDER — METHYLPREDNISOLONE ACETATE 40 MG/ML IJ SUSP
INTRAMUSCULAR | Status: DC | PRN
  Administered 2023-08-26: 40 mg

## 2023-08-26 MED ORDER — SURGIFLO WITH THROMBIN (HEMOSTATIC MATRIX KIT) OPTIME
TOPICAL | Status: DC | PRN
  Administered 2023-08-26: 1 via TOPICAL

## 2023-08-26 MED ORDER — REMIFENTANIL HCL 1 MG IV SOLR
INTRAVENOUS | Status: AC
Start: 2023-08-26 — End: ?
  Filled 2023-08-26: qty 1000

## 2023-08-26 MED ORDER — KETAMINE HCL 50 MG/5ML IJ SOSY
PREFILLED_SYRINGE | INTRAMUSCULAR | Status: AC
  Filled 2023-08-26: qty 5

## 2023-08-26 MED ORDER — CYCLOBENZAPRINE HCL 10 MG PO TABS
10.0000 mg | ORAL_TABLET | Freq: Three times a day (TID) | ORAL | 0 refills | Status: DC | PRN
  Filled 2023-08-26: qty 90, 30d supply, fill #0

## 2023-08-26 MED ORDER — SODIUM CHLORIDE 0.9 % IV SOLN: INTRAVENOUS | Status: DC | PRN

## 2023-08-26 MED ORDER — REMIFENTANIL HCL 1 MG IV SOLR
INTRAVENOUS | Status: DC | PRN
  Administered 2023-08-26: .1 ug/kg/min via INTRAVENOUS

## 2023-08-26 MED ORDER — CHLORHEXIDINE GLUCONATE 0.12 % MT SOLN
OROMUCOSAL | Status: AC
  Filled 2023-08-26: qty 15

## 2023-08-26 MED ORDER — ROCURONIUM BROMIDE 10 MG/ML (PF) SYRINGE
PREFILLED_SYRINGE | INTRAVENOUS | Status: AC
  Filled 2023-08-26: qty 10

## 2023-08-26 MED ORDER — DEXAMETHASONE SODIUM PHOSPHATE 10 MG/ML IJ SOLN
INTRAMUSCULAR | Status: AC
  Filled 2023-08-26: qty 1

## 2023-08-26 MED ORDER — VASOPRESSIN 20 UNIT/ML IV SOLN
INTRAVENOUS | Status: DC | PRN
  Administered 2023-08-26: .5 [IU] via INTRAVENOUS
  Administered 2023-08-26 (×2): 1 [IU] via INTRAVENOUS

## 2023-08-26 MED ORDER — PROPOFOL 1000 MG/100ML IV EMUL
INTRAVENOUS | Status: AC
  Filled 2023-08-26: qty 100

## 2023-08-26 MED ORDER — CEFAZOLIN SODIUM-DEXTROSE 2-4 GM/100ML-% IV SOLN
2.0000 g | Freq: Once | INTRAVENOUS | Status: AC
  Administered 2023-08-26: 2 g via INTRAVENOUS

## 2023-08-26 MED ORDER — SODIUM CHLORIDE 0.9% FLUSH: 3.0000 mL | INTRAVENOUS | Status: DC | PRN

## 2023-08-26 MED ORDER — DEXAMETHASONE SODIUM PHOSPHATE 10 MG/ML IJ SOLN
INTRAMUSCULAR | Status: DC | PRN
  Administered 2023-08-26: 10 mg via INTRAVENOUS

## 2023-08-26 SURGICAL SUPPLY — 33 items
BASIN KIT SINGLE STR (MISCELLANEOUS) ×1 IMPLANT
BUR NEURO DRILL SOFT 3.0X3.8M (BURR) ×1 IMPLANT
DERMABOND ADVANCED .7 DNX12 (GAUZE/BANDAGES/DRESSINGS) ×1 IMPLANT
DRAPE C ARM PK CFD 31 SPINE (DRAPES) ×1 IMPLANT
DRAPE LAPAROTOMY 100X77 ABD (DRAPES) ×1 IMPLANT
DRAPE MICROSCOPE SPINE 48X150 (DRAPES) ×1 IMPLANT
DRSG OPSITE POSTOP 3X4 (GAUZE/BANDAGES/DRESSINGS) ×1 IMPLANT
ELECT EZSTD 165MM 6.5IN (MISCELLANEOUS) ×1 IMPLANT
ELECT REM PT RETURN 9FT ADLT (ELECTROSURGICAL) ×1 IMPLANT
ELECTRODE EZSTD 165MM 6.5IN (MISCELLANEOUS) ×1 IMPLANT
ELECTRODE REM PT RTRN 9FT ADLT (ELECTROSURGICAL) ×1 IMPLANT
GLOVE BIOGEL PI IND STRL 6.5 (GLOVE) ×1 IMPLANT
GLOVE SURG SYN 6.5 ES PF (GLOVE) ×1 IMPLANT
GLOVE SURG SYN 6.5 PF PI (GLOVE) ×1 IMPLANT
GLOVE SURG SYN 8.5 E (GLOVE) ×3 IMPLANT
GLOVE SURG SYN 8.5 PF PI (GLOVE) ×3 IMPLANT
GOWN SRG LRG LVL 4 IMPRV REINF (GOWNS) ×1 IMPLANT
GOWN SRG XL LVL 3 NONREINFORCE (GOWNS) ×1 IMPLANT
KIT SPINAL PRONEVIEW (KITS) ×1 IMPLANT
MANIFOLD NEPTUNE II (INSTRUMENTS) ×1 IMPLANT
MARKER SKIN DUAL TIP RULER LAB (MISCELLANEOUS) ×1 IMPLANT
NDL SAFETY ECLIPSE 18X1.5 (NEEDLE) ×1 IMPLANT
NS IRRIG 500ML POUR BTL (IV SOLUTION) ×1 IMPLANT
PACK LAMINECTOMY ARMC (PACKS) ×1 IMPLANT
PAD ARMBOARD 7.5X6 YLW CONV (MISCELLANEOUS) ×1 IMPLANT
SURGIFLO W/THROMBIN 8M KIT (HEMOSTASIS) ×1 IMPLANT
SUT STRATA 3-0 15 PS-2 (SUTURE) ×1 IMPLANT
SUT VIC AB 0 CT1 27XCR 8 STRN (SUTURE) ×1 IMPLANT
SUT VIC AB 2-0 CT1 18 (SUTURE) ×1 IMPLANT
SYR 30ML LL (SYRINGE) ×2 IMPLANT
SYR 3ML LL SCALE MARK (SYRINGE) ×1 IMPLANT
TRAP FLUID SMOKE EVACUATOR (MISCELLANEOUS) ×1 IMPLANT
WATER STERILE IRR 500ML POUR (IV SOLUTION) IMPLANT

## 2023-08-26 NOTE — Interval H&P Note (Signed)
 History and Physical Interval Note:  08/26/2023 6:59 AM  [place]  has presented today for surgery, with the diagnosis of M54.16 lumbar radiculopathy R29.898 right leg weakness.  The various methods of treatment have been discussed with the patient and family. After consideration of risks, benefits and other options for treatment, the patient has consented to  Procedure(s) with comments: LUMBAR LAMINECTOMY/DECOMPRESSION MICRODISCECTOMY 1 LEVEL (Right) - RIGHT L2-3 MICRODISCECTOMY as a surgical intervention.  The patient's history has been reviewed, patient examined, no change in status, stable for surgery.  I have reviewed the patient's chart and labs.  Questions were answered to the patient's satisfaction.    Heart sounds normal no MRG. Chest Clear to Auscultation Bilaterally.  Pj Zehner

## 2023-08-26 NOTE — Discharge Instructions (Addendum)
 Your surgeon has performed an operation on your lumbar spine (low back) to relieve pressure on one or more nerves. Many times, patients feel better immediately after surgery and can "overdo it." Even if you feel well, it is important that you follow these activity guidelines. If you do not let your back heal properly from the surgery, you can increase the chance of a disc herniation and/or return of your symptoms. The following are instructions to help in your recovery once you have been discharged from the hospital.  * It is ok to take NSAIDs after surgery.  Activity    No bending, lifting, or twisting ("BLT"). Avoid lifting objects heavier than 10 pounds (gallon milk jug).  Where possible, avoid household activities that involve lifting, bending, pushing, or pulling such as laundry, vacuuming, grocery shopping, and childcare. Try to arrange for help from friends and family for these activities while your back heals.  Increase physical activity slowly as tolerated.  Taking short walks is encouraged, but avoid strenuous exercise. Do not jog, run, bicycle, lift weights, or participate in any other exercises unless specifically allowed by your doctor. Avoid prolonged sitting, including car rides.  Talk to your doctor before resuming sexual activity.  You should not drive until cleared by your doctor.  Until released by your doctor, you should not return to work or school.  You should rest at home and let your body heal.   You may shower three days after your surgery.  After showering, lightly dab your incision dry. Do not take a tub bath or go swimming for 3 weeks, or until approved by your doctor at your follow-up appointment.  If you smoke, we strongly recommend that you quit.  Smoking has been proven to interfere with normal healing in your back and will dramatically reduce the success rate of your surgery. Please contact [facility] (800-QUIT-NOW) and use the resources at www.[facility].com for  assistance in stopping smoking.  Surgical Incision   If you have a dressing on your incision, you may remove it three days after your surgery. Keep your incision area clean and dry.  If you have staples or stitches on your incision, you should have a follow up scheduled for removal. If you do not have staples or stitches, you will have steri-strips (small pieces of surgical tape) or Dermabond glue. The steri-strips/glue should begin to peel away within about a week (it is fine if the steri-strips fall off before then). If the strips are still in place one week after your surgery, you may gently remove them.  Diet            You may return to your usual diet. Be sure to stay hydrated.  When to Contact us  Although your surgery and recovery will likely be uneventful, you may have some residual numbness, aches, and pains in your back and/or legs. This is normal and should improve in the next few weeks.  However, should you experience any of the following, contact us immediately: New numbness or weakness Pain that is progressively getting worse, and is not relieved by your pain medications or rest Bleeding, redness, swelling, pain, or drainage from surgical incision Chills or flu-like symptoms Fever greater than 101.0 F (38.3 C) Problems with bowel or bladder functions Difficulty breathing or shortness of breath Warmth, tenderness, or swelling in your calf  Contact Information How to contact us:  If you have any questions/concerns before or after surgery, you can reach [place] at [phone], or you can  send a [employer]. We can be reached by phone or mychart 8am-4pm, Monday-Friday.  *Please note: Calls after 4pm are forwarded to a third party answering service. Mychart messages are not routinely monitored during evenings, weekends, and holidays. Please call our office to contact the answering service for urgent concerns during non-business hours.

## 2023-08-26 NOTE — Anesthesia Preprocedure Evaluation (Addendum)
 Anesthesia Evaluation  Patient identified by MRN, date of birth, ID band Patient awake    Reviewed: Allergy & Precautions, H&P , NPO status , Patient's Chart, lab work & pertinent test results  Airway Mallampati: II  TM Distance: >3 FB Neck ROM: full    Dental  (+) Chipped   Pulmonary sleep apnea    Pulmonary exam normal        Cardiovascular hypertension, Normal cardiovascular exam     Neuro/Psych  Neuromuscular disease  negative psych ROS   GI/Hepatic negative GI ROS, Neg liver ROS,,,  Endo/Other  diabetes    Renal/GU Renal InsufficiencyRenal disease     Musculoskeletal  (+) Arthritis ,    Abdominal  (+) + obese  Peds  Hematology negative hematology ROS (+)   Anesthesia Other Findings Past Medical History: No date: Arthritis 04/25/2015: Chronic kidney disease No 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) No date: Diabetes mellitus without complication (HCC) No date: Glaucoma No date: Gout No date: Hyperlipidemia No date: Hypertension No 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 No date: Multinodular goiter No date: Neuropathy     Comment:  feet No date: Obesity No date: Radiculopathy of cervical region No date: Sleep apnea     Comment:  CPAP  Past Surgical History: No date: ABDOMINAL HYSTERECTOMY No date: BREAST CYST ASPIRATION No date: CHOLECYSTECTOMY 02/01/2020: COLONOSCOPY WITH PROPOFOL; N/A     Comment:  Procedure: COLONOSCOPY WITH PROPOFOL;  Surgeon: [name], MD;  Location: [facility];  Service:               Endoscopy;  Laterality: N/A;  Diabetic - oral meds sleep              apnea priority 4 No date: HERNIA REPAIR     Comment:  x 2 No date: THUMB FUSION  BMI    Body Mass Index: 36.90 kg/m      Reproductive/Obstetrics negative OB ROS                             Anesthesia  Physical Anesthesia Plan  ASA: 3  Anesthesia Plan: General   Post-op Pain Management:    Induction:   PONV Risk Score and Plan:   Airway Management Planned: Oral ETT  Additional Equipment:   Intra-op Plan:   Post-operative Plan:   Informed Consent:      Dental Advisory Given  Plan Discussed with: CRNA and Surgeon  Anesthesia Plan Comments:         Anesthesia Quick Evaluation

## 2023-08-26 NOTE — Anesthesia Procedure Notes (Signed)
 Procedure Name: Intubation Date/Time: 08/26/2023 7:23 AM  Performed by: Emeterio Reeve, CRNAPre-anesthesia Checklist: Patient identified, Emergency Drugs available, Suction available and Patient being monitored Patient Re-evaluated:Patient Re-evaluated prior to induction Oxygen Delivery Method: Circle system utilized Preoxygenation: Pre-oxygenation with 100% oxygen Induction Type: IV induction Ventilation: Mask ventilation without difficulty Laryngoscope Size: McGrath and 4 Tube type: Oral Tube size: 7.0 mm Number of attempts: 1 Airway Equipment and Method: Stylet and Oral airway Placement Confirmation: ETT inserted through vocal cords under direct vision, positive ETCO2 and breath sounds checked- equal and bilateral Secured at: 22 cm Tube secured with: Tape Dental Injury: Teeth and Oropharynx as per pre-operative assessment  Comments: Cords clear; no trauma. CA

## 2023-08-26 NOTE — Discharge Summary (Signed)
 Discharge Summary  [name] MRN: [id number] DOB/AGE: 02-13-1946 78 y.o.  Admit date: 08/26/2023 Discharge date: 08/26/2023  Admission Diagnoses: M54.16 lumbar radiculopathy, R29.898 right leg weakness   Discharge Diagnoses:  Active Problems:   * No active hospital problems. *   Discharged Condition: good  Hospital Course:  Catherine Hinton is 78 y.o presenting with lumbar radiculopathy s/p right L2-3 microdiscectomy. Her intraoperative course was uncomplicated. She was monitored in PACU and discharged home after ambulating, urinating, and tolerating PO intake.   Consults: None  Significant Diagnostic Studies: none  Treatments: surgery: as above. Please see separately dictated operative report for further details   Discharge Exam: Blood pressure (!) 150/78, pulse 87, temperature 97.9 F (36.6 C), temperature source Temporal, resp. rate 17, height 5\' 3"  (1.6 m), weight 94.5 kg, SpO2 97%. CN grossly intact MAEW Incision c/d/I   Disposition: Discharge disposition: 01-Home or Self Care        Allergies as of 08/26/2023       Reactions   Niacin Itching   Sulfa Antibiotics Other (See Comments)   Fever        Medication List     PAUSE taking these medications    aspirin EC 81 MG tablet Wait to take this until your doctor or other care provider tells you to start again. Take 81 mg by mouth in the morning.       STOP taking these medications    oxyCODONE-acetaminophen 5-325 MG tablet Commonly known as: PERCOCET/ROXICET       TAKE these medications    acetaminophen 500 MG tablet Commonly known as: TYLENOL Take 1,000 mg by mouth every 6 (six) hours as needed.   allopurinol 300 MG tablet Commonly known as: ZYLOPRIM Take 1 tablet (300 mg total) by mouth daily.   celecoxib 100 MG capsule Commonly known as: CeleBREX Take 1 capsule (100 mg total) by mouth 2 (two) times daily for 14 days.   Cholecalciferol 25 MCG (1000 UT)  tablet Take 1,000 Units by mouth in the morning.   cyanocobalamin 1000 MCG tablet Commonly known as: VITAMIN B12 Take 1,000 mcg by mouth in the morning.   cyclobenzaprine 10 MG tablet Commonly known as: FLEXERIL Take 1 tablet (10 mg total) by mouth 3 (three) times daily as needed for muscle spasms. What changed:  medication strength how much to take reasons to take this   DULoxetine 60 MG capsule Commonly known as: Cymbalta Take 1 capsule (60 mg total) by mouth daily.   EYE VITAMINS PO Take 1 tablet by mouth daily.   gabapentin 300 MG capsule Commonly known as: NEURONTIN TAKE 2 CAPSULES EVERY MORNING, AND THEN 2 CAPSULES AT 2 PM, AND THEN TAKE 3 CAPSULES EVERY NIGHT BEFORE BEDTIME.   metFORMIN 500 MG 24 hr tablet Commonly known as: GLUCOPHAGE-XR TAKE 2 TABLETS EVERY MORNING  AND TAKE 2 TABLETS AT BEDTIME   metoprolol succinate 50 MG 24 hr tablet Commonly known as: TOPROL-XL Take 1 tablet (50 mg total) by mouth daily. Take with or immediately following a meal. What changed: when to take this   oxyCODONE 5 MG immediate release tablet Commonly known as: Roxicodone Take 1 tablet (5 mg total) by mouth every 4 (four) hours as needed for severe pain (pain score 7-10).   rosuvastatin 20 MG tablet Commonly known as: Crestor Take 1 tablet (20 mg total) by mouth daily. What changed: when to take this   senna 8.6 MG Tabs tablet Commonly known as: SENOKOT Take 1 tablet (  8.6 mg total) by mouth 2 (two) times daily as needed for mild constipation.   THERA TEARS NUTRITION PO Take 1 mg by mouth 2 (two) times daily.   True Metrix Air Glucose Meter w/Device Kit Use to check blood sugar 2-3 times a day and document.  Bring log to visits.   True Metrix Blood Glucose Test test strip Generic drug: glucose blood TEST BLOOD SUGAR 2 TO 3 TIMES DAILY AND DOCUMENT. BRING LOG TO VISITS.   TRUEplus Lancets 33G Misc Use to check blood sugar 2 to 3 times a day and document. Bring log to  visits.   valsartan-hydrochlorothiazide 80-12.5 MG tablet Commonly known as: DIOVAN-HCT Take 1 tablet by mouth daily.   Xiidra 5 % Soln Generic drug: Lifitegrast Place 1 drop into both eyes 2 (two) times daily.        Follow-up Information     Drake Leach, PA-C Follow up on 09/10/2023.   Specialty: Neurosurgery Contact information: [address] [place] [address] [phone]                 Signed: Susanne Borders 08/26/2023, 8:44 AM

## 2023-08-26 NOTE — Transfer of Care (Signed)
 Immediate Anesthesia Transfer of Care Note  Patient: [name]  Procedure(s) Performed: LUMBAR LAMINECTOMY/DECOMPRESSION MICRODISCECTOMY 1 LEVEL (Right)  Patient Location: PACU  Anesthesia Type:General  Level of Consciousness: drowsy and patient cooperative  Airway & Oxygen Therapy: Patient Spontanous Breathing and Patient connected to face mask oxygen  Post-op Assessment: Report given to RN and Post -op Vital signs reviewed and stable  Post vital signs: stable  Last Vitals:  Vitals Value Taken Time  BP 125/55 08/26/23 0850  Temp    Pulse 78 08/26/23 0853  Resp 17 08/26/23 0853  SpO2 100 % 08/26/23 0853  Vitals shown include unfiled device data.  Last Pain:  Vitals:   08/26/23 0617  TempSrc: Temporal  PainSc: 5          Complications: No notable events documented.

## 2023-08-26 NOTE — Op Note (Signed)
 Indications: [name] is suffering from lumbar radiculopathy and leg weakness. The patient tried and failed conservative management, prompting surgical intervention.  Findings: disc herniation L2/3  Preoperative Diagnosis: M54.16 lumbar radiculopathy, R29.898 right leg weakness  Postoperative Diagnosis: same   EBL: 10 ml IVF: see anesthesia record Drains: none Disposition: Extubated and Stable to PACU Complications: none  No foley catheter was placed.   Preoperative Note:   Risks of surgery discussed include: infection, bleeding, stroke, coma, death, paralysis, CSF leak, nerve/spinal cord injury, numbness, tingling, weakness, complex regional pain syndrome, recurrent stenosis and/or disc herniation, vascular injury, development of instability, neck/back pain, need for further surgery, persistent symptoms, development of deformity, and the risks of anesthesia. The patient understood these risks and agreed to proceed.  Operative Note:   1) Right L2/3 microdiscectomy  The patient was then brought from the preoperative center with intravenous access established.  The patient underwent general anesthesia and endotracheal tube intubation, and was then rotated on the [place] rail top where all pressure points were appropriately padded.  The skin was then thoroughly cleansed.  Perioperative antibiotic prophylaxis was administered.  Sterile prep and drapes were then applied and a timeout was then observed.  C-arm was brought into the field under sterile conditions, and the L2-3 disc space identified and marked with an incision on the right 1cm lateral to midline.  Once this was complete a 2 cm incision was opened with the use of a #10 blade knife.  The Metrx tubes were sequentially advanced under lateral fluoroscopy until a 18 x 50 mm Metrx tube was placed over the facet and lamina and secured to the bed.    The microscope was then sterilely brought into the field and muscle creep was  hemostased with a bipolar and resected with a pituitary rongeur.  A Bovie extender was then used to expose the spinous process and lamina.  Careful attention was placed to not violate the facet capsule. A 3 mm matchstick drill bit was then used to make a hemi-laminotomy trough until the ligamentum flavum was exposed.  This was extended to the base of the spinous process.  Once this was complete and the underlying ligamentum flavum was visualized, the ligamentum was dissected with an up angle curette and resected with a #2 and #3 mm biting Kerrison.  The laminotomy opening was also expanded in similar fashion and hemostasis was obtained with Surgifoam and a patty as well as bone wax.  The rostral aspect of the caudal level of the lamina was also resected with a #2 biting Kerrison effort to further enhance exposure.  Once the underlying dura was visualized a Penfield 4 was then used to dissect and expose the traversing nerve root.  Once this was identified a nerve root retractor suction was used to mobilize this medially.  The venous plexus was hemostased with Surgifoam and light bipolar use.  A small penfied was then used to make a small annulotomy within the disc space and disc space contents were noted to come through the annulus.    The disc herniation was identified and dissected free using a balltip probe. The pituitary rongeur was used to remove the extruded disc fragments. Once the thecal sac and nerve root were noted to be relaxed and under less tension the ball-tipped feeler was passed along the foramen distally to ensure no residual compression was noted.    Depo-Medrol was placed along the nerve root.  The area was irrigated. The tube system was then removed  under microscopic visualization and hemostasis was obtained with a bipolar.    The fascial layer was reapproximated with the use of a 0- Vicryl suture.  Subcutaneous tissue layer was reapproximated using 2-0 Vicryl suture.  3-0 monocryl was used  on the skin. The skin was then cleansed and Dermabond was used to close the skin opening.  Patient was then rotated back to the preoperative bed awakened from anesthesia and taken to recovery all counts are correct in this case.   I performed the entire procedure with the assistance of Manning Charity PA as an Designer, television/film set. An assistant was required for this procedure due to the complexity.  The assistant provided assistance in tissue manipulation and suction, and was required for the successful and safe performance of the procedure. I performed the critical portions of the procedure.   Venetia Night MD

## 2023-08-27 ENCOUNTER — Encounter: Payer: Self-pay | Admitting: Neurosurgery

## 2023-08-27 NOTE — Anesthesia Postprocedure Evaluation (Signed)
 Anesthesia Post Note  Patient: [place] H[place]  Procedure(s) Performed: LUMBAR LAMINECTOMY/DECOMPRESSION MICRODISCECTOMY 1 LEVEL (Right)  Patient location during evaluation: PACU Anesthesia Type: General Level of consciousness: awake and alert Pain management: pain level controlled Vital Signs Assessment: post-procedure vital signs reviewed and stable Respiratory status: spontaneous breathing, nonlabored ventilation and respiratory function stable Cardiovascular status: blood pressure returned to baseline and stable Postop Assessment: no apparent nausea or vomiting Anesthetic complications: no   No notable events documented.   Last Vitals:  Vitals:   08/26/23 0924 08/26/23 0936  BP: 117/63 (!) 114/58  Pulse: 80 84  Resp: 15 16  Temp: 36.6 C 36.6 C  SpO2: 98% 96%    Last Pain:  Vitals:   08/26/23 0936  TempSrc: Temporal  PainSc: 0-No pain                 Foye Deer

## 2023-08-28 ENCOUNTER — Ambulatory Visit: Payer: Medicare HMO | Admitting: Nurse Practitioner

## 2023-08-28 ENCOUNTER — Telehealth: Payer: Self-pay

## 2023-08-28 NOTE — Telephone Encounter (Signed)
Can we do this for the patient

## 2023-08-28 NOTE — Telephone Encounter (Signed)
 Copied from CRM [id number]. Topic: General - Other >> Aug 28, 2023 11:35 AM Geroge Baseman wrote: Reason for CRM: Patient has severe neuropathy and her daughter is wanting to know if she can get a handicap placard for her car as she is very unstable. Please call daughter Enrique Sack [phone] to follow up.

## 2023-08-29 NOTE — Telephone Encounter (Signed)
 Called and notified patient's daughter that the form was ready to be picked up.

## 2023-08-29 NOTE — Progress Notes (Signed)
   REFERRING PHYSICIAN:  Channing Mutters [address] [place],  [place] [address]  DOS: 08/26/23 right L2-L3 microdiscectomy  HISTORY OF PRESENT ILLNESS: [place] Hinton is approximately 2 weeks status post above surgery. Was given flexeril and oxycodone on discharge from the hospital.   She is doing very well! She has some soreness in her lower back. Her right leg pain is mostly gone! She has chronic numbness and tingling in her legs and this is unchanged.   She is taking prn flexeril. Not taking oxycodone.    PHYSICAL EXAMINATION:  General: Patient is well developed, well nourished, calm, collected, and in no apparent distress.   NEUROLOGICAL:  General: In no acute distress.   Awake, alert, oriented to person, place, and time.  Pupils equal round and reactive to light.  Facial tone is symmetric.     Strength:          Side Iliopsoas Quads Hamstring PF DF EHL  R 5 5 5 5 5 5   L 5 5 5 5 5 5    Incision c/d/i   ROS (Neurologic):  Negative except as noted above  IMAGING: Nothing new to review.   ASSESSMENT/PLAN:  Catherine Hinton is doing well s/p above surgery. Treatment options reviewed with patient and following plan made:   - I have advised the patient to lift up to 10 pounds until 6 weeks after surgery (follow up with Dr. Myer Haff).  - Reviewed wound care.  - No bending, twisting, or lifting.  - Continue on current medications including prn flexeril.  - Follow up as scheduled in 4 weeks and prn.   Advised to contact the office if any questions or concerns arise.  Drake Leach PA-C Department of neurosurgery

## 2023-09-10 ENCOUNTER — Ambulatory Visit (INDEPENDENT_AMBULATORY_CARE_PROVIDER_SITE_OTHER): Admitting: Orthopedic Surgery

## 2023-09-10 ENCOUNTER — Encounter: Payer: Self-pay | Admitting: Orthopedic Surgery

## 2023-09-10 VITALS — BP 120/78 | Temp 98.3°F | Ht 63.0 in | Wt 208.0 lb

## 2023-09-10 DIAGNOSIS — Z9889 Other specified postprocedural states: Secondary | ICD-10-CM

## 2023-09-10 DIAGNOSIS — M5126 Other intervertebral disc displacement, lumbar region: Secondary | ICD-10-CM

## 2023-09-13 ENCOUNTER — Other Ambulatory Visit: Payer: Self-pay

## 2023-09-15 DIAGNOSIS — E119 Type 2 diabetes mellitus without complications: Secondary | ICD-10-CM | POA: Insufficient documentation

## 2023-09-15 NOTE — Patient Instructions (Signed)
 Be Involved in Caring For Your Health:  Taking Medications When medications are taken as directed, they can greatly improve your health. But if they are not taken as prescribed, they may not work. In some cases, not taking them correctly can be harmful. To help ensure your treatment remains effective and safe, understand your medications and how to take them. Bring your medications to each visit for review by your provider.  Your lab results, notes, and after visit summary will be available on My Chart. We strongly encourage you to use this feature. If lab results are abnormal the clinic will contact you with the appropriate steps. If the clinic does not contact you assume the results are satisfactory. You can always view your results on My Chart. If you have questions regarding your health or results, please contact the clinic during office hours. You can also ask questions on My Chart.  We at [facility] are grateful that you chose [place] to provide your care. We strive to provide evidence-based and compassionate care and are always looking for feedback. If you get a survey from the clinic please complete this so we can hear your opinions.  Diabetes Mellitus and Foot Care Diabetes, also called diabetes mellitus, may cause problems with your feet and legs because of poor blood flow (circulation). Poor circulation may make your skin: Become thinner and drier. Break more easily. Heal more slowly. Peel and crack. You may also have nerve damage (neuropathy). This can cause decreased feeling in your legs and feet. This means that you may not notice minor injuries to your feet that could lead to more serious problems. Finding and treating problems early is the best way to prevent future foot problems. How to care for your feet Foot hygiene  Wash your feet daily with warm water and mild soap. Do not use hot water. Then, pat your feet and the areas between your toes until they are fully dry. Do  not soak your feet. This can dry your skin. Trim your toenails straight across. Do not dig under them or around the cuticle. File the edges of your nails with an emery board or nail file. Apply a moisturizing lotion or petroleum jelly to the skin on your feet and to dry, brittle toenails. Use lotion that does not contain alcohol and is unscented. Do not apply lotion between your toes. Shoes and socks Wear clean socks or stockings every day. Make sure they are not too tight. Do not wear knee-high stockings. These may decrease blood flow to your legs. Wear shoes that fit well and have enough cushioning. Always look in your shoes before you put them on to be sure there are no objects inside. To break in new shoes, wear them for just a few hours a day. This prevents injuries on your feet. Wounds, scrapes, corns, and calluses  Check your feet daily for blisters, cuts, bruises, sores, and redness. If you cannot see the bottom of your feet, use a mirror or ask someone for help. Do not cut off corns or calluses or try to remove them with medicine. If you find a minor scrape, cut, or break in the skin on your feet, keep it and the skin around it clean and dry. You may clean these areas with mild soap and water. Do not clean the area with peroxide, alcohol, or iodine. If you have a wound, scrape, corn, or callus on your foot, look at it several times a day to make sure it  is healing and not infected. Check for: Redness, swelling, or pain. Fluid or blood. Warmth. Pus or a bad smell. General tips Do not cross your legs. This may decrease blood flow to your feet. Do not use heating pads or hot water bottles on your feet. They may burn your skin. If you have lost feeling in your feet or legs, you may not know this is happening until it is too late. Protect your feet from hot and cold by wearing shoes, such as at the beach or on hot pavement. Schedule a complete foot exam at least once a year or more often if  you have foot problems. Report any cuts, sores, or bruises to your health care provider right away. Where to find more information American Diabetes Association: diabetes.org Association of Diabetes Care & Education Specialists: diabeteseducator.org Contact a health care provider if: You have a condition that increases your risk of infection, and you have any cuts, sores, or bruises on your feet. You have an injury that is not healing. You have redness on your legs or feet. You feel burning or tingling in your legs or feet. You have pain or cramps in your legs and feet. Your legs or feet are numb. Your feet always feel cold. You have pain around any toenails. Get help right away if: You have a wound, scrape, corn, or callus on your foot and: You have signs of infection. You have a fever. You have a red line going up your leg. This information is not intended to replace advice given to you by your health care provider. Make sure you discuss any questions you have with your health care provider. Document Revised: 12/06/2021 Document Reviewed: 12/06/2021 Elsevier Patient Education  2024 [employer].

## 2023-09-19 ENCOUNTER — Ambulatory Visit: Payer: Medicare HMO | Admitting: Nurse Practitioner

## 2023-09-23 ENCOUNTER — Other Ambulatory Visit: Payer: Self-pay | Admitting: Endocrinology

## 2023-09-23 DIAGNOSIS — E0590 Thyrotoxicosis, unspecified without thyrotoxic crisis or storm: Secondary | ICD-10-CM

## 2023-09-24 ENCOUNTER — Ambulatory Visit (INDEPENDENT_AMBULATORY_CARE_PROVIDER_SITE_OTHER): Payer: Self-pay | Admitting: Nurse Practitioner

## 2023-09-24 ENCOUNTER — Encounter: Payer: Self-pay | Admitting: Nurse Practitioner

## 2023-09-24 VITALS — BP 96/64 | HR 92 | Temp 98.7°F | Ht 62.9 in | Wt 214.2 lb

## 2023-09-24 DIAGNOSIS — I700 Atherosclerosis of aorta: Secondary | ICD-10-CM

## 2023-09-24 DIAGNOSIS — E1169 Type 2 diabetes mellitus with other specified complication: Secondary | ICD-10-CM | POA: Diagnosis not present

## 2023-09-24 DIAGNOSIS — E1142 Type 2 diabetes mellitus with diabetic polyneuropathy: Secondary | ICD-10-CM | POA: Diagnosis not present

## 2023-09-24 DIAGNOSIS — G4733 Obstructive sleep apnea (adult) (pediatric): Secondary | ICD-10-CM

## 2023-09-24 DIAGNOSIS — E1159 Type 2 diabetes mellitus with other circulatory complications: Secondary | ICD-10-CM | POA: Diagnosis not present

## 2023-09-24 DIAGNOSIS — E0590 Thyrotoxicosis, unspecified without thyrotoxic crisis or storm: Secondary | ICD-10-CM

## 2023-09-24 DIAGNOSIS — Z7984 Long term (current) use of oral hypoglycemic drugs: Secondary | ICD-10-CM

## 2023-09-24 DIAGNOSIS — E785 Hyperlipidemia, unspecified: Secondary | ICD-10-CM

## 2023-09-24 DIAGNOSIS — Z9889 Other specified postprocedural states: Secondary | ICD-10-CM

## 2023-09-24 DIAGNOSIS — E119 Type 2 diabetes mellitus without complications: Secondary | ICD-10-CM

## 2023-09-24 DIAGNOSIS — I152 Hypertension secondary to endocrine disorders: Secondary | ICD-10-CM

## 2023-09-24 LAB — MICROALBUMIN, URINE WAIVED
Creatinine, Urine Waived: 100 mg/dL (ref 10–300)
Microalb, Ur Waived: 150 mg/L — ABNORMAL HIGH (ref 0–19)

## 2023-09-24 LAB — BAYER DCA HB A1C WAIVED: HB A1C (BAYER DCA - WAIVED): 6.3 % — ABNORMAL HIGH (ref 4.8–5.6)

## 2023-09-24 NOTE — Assessment & Plan Note (Addendum)
 Back and leg pain are improving. Reports 5/10 pain in office today. Plans to restart ASA therapy. Follow up in 3 months.

## 2023-09-24 NOTE — Assessment & Plan Note (Signed)
 Ongoing. Will restart ASA and continue to monitor.

## 2023-09-24 NOTE — Assessment & Plan Note (Addendum)
 Chronic, ongoing. Continue current medication regimen Metformin XR 1000 mg BID. Labs today. Focus on diet of smaller high protein, low fat meals more frequently and daily exercise. Follow up in 3 months.

## 2023-09-24 NOTE — Assessment & Plan Note (Signed)
 Chronic, stable. BP below goal today in office. Continue current medication regimen of Valsartan-hydrochlorothiazide 80 mg-12.5 mg daily and Metoprolol XL 50 mg daily. Heart rate improved today in office. Labs: CBC, CMP, Microalbumin. Continue DASH diet at home and monitor BP readings and document. Follow up in 3 months.

## 2023-09-24 NOTE — Progress Notes (Signed)
 BP 96/64   Pulse 92   Temp 98.7 F (37.1 C) (Oral)   Ht 5' 2.9" (1.598 m)   Wt 214 lb 3.2 oz (97.2 kg)   LMP  (LMP Unknown)   SpO2 97%   BMI 38.06 kg/m    Subjective:    [name], female    DOB: 05/22/1946, 78 y.o.   MRN: [id number]  HPI: Catherine Hinton is a 78 y.o. female    Chief Complaint  Patient presents with   Diabetes   Hyperlipidemia   Hypertension   NOTE WRITTEN BY DNP STUDENT.  ASSESSMENT AND PLAN OF CARE REVIEWED WITH STUDENT, AGREE WITH ABOVE FINDINGS AND PLAN.  DIABETES A1c 6.2% January.  Takes Metformin 1000 MG BID + Gabapentin & Duloxetine for neuropathy which offers benefit.  Saw neurologist on 11/13/22 for neuropathy. 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  Recent lumbar laminectomy on 08/26/23 due to radiculopathy and right leg weakness, is continuing to recover.  Last saw neurosurgery on 09/10/23. Back is slowly improving. Hypoglycemic episodes:no Polydipsia/polyuria: no Visual disturbance: no Chest pain: no Paresthesias: no Glucose Monitoring: yes  Accucheck frequency: Daily  Fasting glucose: 103 Taking Insulin?: no Blood Pressure Monitoring: a few times a week Retinal Examination: Up to Date Foot Exam: Up to Date Diabetic Education: Completed Pneumovax: Up to Date Influenza: Up to Date Aspirin:  not since surgery but plans to start back    HYPERTENSION / HYPERLIPIDEMIA Continues Metoprolol, Valsartan-HCTZ, ASA and Crestor. CPAP nightly -- last sleep study 11/14/22, moderates OSA.  Satisfied with current treatment? yes Duration of hypertension: years BP monitoring frequency: a few times a week BP range: 116/68 BP medication side effects: no Duration of hyperlipidemia: years Cholesterol medication side effects: no Cholesterol supplements: fish oil Medication compliance: good compliance Aspirin: yes Recent stressors: no Recurrent headaches: no Visual changes:  no Palpitations: no Dyspnea: no Chest pain: no Lower extremity edema: no Dizzy/lightheaded: no  The ASCVD Risk score (Arnett DK, et al., 2019) failed to calculate for the following reasons:   The valid total cholesterol range is 130 to 320 mg/dL  HYPERTHYROIDISM Saw [facility] endocrinology 09/18/23. Takes Metoprolol for HR control.  Does have upper tremor at baseline.  Had thyroid ultrasound with endo and one new small nodule noted, but no biopsy at time.  Scheduled for repeat testing, thyroid uptake.  Thyroid control status:uncontrolled Satisfied with current treatment? no Medication side effects: no Medication compliance: good compliance Recent dose adjustment:no Fatigue: no Cold intolerance: no Heat intolerance: yes Weight gain: no Weight loss: no Constipation:  at times Diarrhea/loose stools: no Palpitations: no Lower extremity edema: no Anxiety/depressed mood: no   Relevant past medical, surgical, family and social history reviewed and updated as indicated. Interim medical history since our last visit reviewed. Allergies and medications reviewed and updated.  Review of Systems  Constitutional:  Positive for fatigue. Negative for appetite change.  HENT: Negative.    Eyes: Negative.   Respiratory:  Negative for chest tightness and shortness of breath.   Cardiovascular:  Negative for chest pain and leg swelling.  Gastrointestinal: Negative.   Endocrine: Positive for heat intolerance. Negative for polydipsia and polyuria.  Genitourinary: Negative.   Musculoskeletal:  Positive for back pain.  Skin: Negative.   Allergic/Immunologic: Negative.   Neurological:  Negative for dizziness, light-headedness and headaches.  Hematological: Negative.   Psychiatric/Behavioral: Negative.     Per HPI unless specifically indicated above     Objective:  BP 96/64   Pulse 92   Temp 98.7 F (37.1 C) (Oral)   Ht 5' 2.9" (1.598 m)   Wt 214 lb 3.2 oz (97.2 kg)   LMP  (LMP  Unknown)   SpO2 97%   BMI 38.06 kg/m   Wt Readings from Last 3 Encounters:  09/24/23 214 lb 3.2 oz (97.2 kg)  09/10/23 208 lb (94.3 kg)  08/26/23 208 lb 5.4 oz (94.5 kg)    Physical Exam Vitals and nursing note reviewed.  Constitutional:      General: She is awake. She is not in acute distress.    Appearance: She is well-developed. She is obese. She is not ill-appearing.  HENT:     Head: Normocephalic.     Right Ear: Hearing, tympanic membrane and ear canal normal. No drainage.     Left Ear: Hearing, tympanic membrane, ear canal and external ear normal. No drainage.     Nose: Nose normal.     Mouth/Throat:     Mouth: Mucous membranes are moist.     Pharynx: Oropharynx is clear.  Eyes:     General: Lids are normal.        Right eye: No discharge.        Left eye: No discharge.     Conjunctiva/sclera: Conjunctivae normal.     Pupils: Pupils are equal, round, and reactive to light.  Neck:     Vascular: No carotid bruit.  Cardiovascular:     Rate and Rhythm: Normal rate and regular rhythm.     Heart sounds: Normal heart sounds. No murmur heard.    No gallop.  Pulmonary:     Effort: Pulmonary effort is normal. No accessory muscle usage or respiratory distress.     Breath sounds: Normal breath sounds.  Abdominal:     General: Bowel sounds are normal.     Palpations: Abdomen is soft.  Musculoskeletal:     Cervical back: Normal range of motion and neck supple.     Right lower leg: No edema.     Left lower leg: No edema.  Lymphadenopathy:     Cervical: No cervical adenopathy.  Skin:    General: Skin is warm and dry.  Neurological:     Mental Status: She is alert and oriented to person, place, and time.     Motor: Tremor present.  Psychiatric:        Attention and Perception: Attention normal.        Mood and Affect: Mood normal.        Behavior: Behavior normal. Behavior is cooperative.        Thought Content: Thought content normal.        Judgment: Judgment normal.      Results for orders placed or performed in visit on 09/24/23  Bayer DCA Hb A1c Waived   Collection Time: 09/24/23 10:48 AM  Result Value Ref Range   HB A1C (BAYER DCA - WAIVED) 6.3 (H) 4.8 - 5.6 %  Microalbumin, Urine Waived   Collection Time: 09/24/23 10:48 AM  Result Value Ref Range   Microalb, Ur Waived 150 (H) 0 - 19 mg/L   Creatinine, Urine Waived 100 10 - 300 mg/dL   Microalb/Creat Ratio 30-300 (H) <30 mg/g      Assessment & Plan:   Problem List Items Addressed This Visit       Cardiovascular and Mediastinum   Hypertension associated with diabetes (HCC)   Chronic, stable. BP below goal today in office. Continue  current medication regimen of Valsartan-hydrochlorothiazide 80 mg-12.5 mg daily and Metoprolol XL 50 mg daily. Heart rate improved today in office. Labs: CBC, CMP, Microalbumin. Continue DASH diet at home and monitor BP readings and document. Follow up in 3 months.      Relevant Orders   Bayer DCA Hb A1c Waived (Completed)   Microalbumin, Urine Waived (Completed)   Aortic atherosclerosis (HCC)   Ongoing. Will restart ASA and continue to monitor.         Respiratory   Obstructive sleep apnea   Chronic, stable.  Continue nightly use, praised for this.          Endocrine   Diabetes mellitus treated with oral medication (HCC)   Chronic, ongoing. Continue current medication regimen Metformin XR 1000 mg BID. Labs today. Focus on diet of smaller high protein, low fat meals more frequently and daily exercise. Follow up in 3 months.      Hyperlipidemia associated with type 2 diabetes mellitus (HCC)   Chronic, stable. Continue current medication regimen Crestor 20 mg daily. Lipid panel today. Follow up in 3 months.      Relevant Orders   Bayer DCA Hb A1c Waived (Completed)   Comprehensive metabolic panel with GFR   Lipid Panel w/o Chol/HDL Ratio   Hyperthyroidism   Chronic, ongoing. Currently no medication on board. Followed by endocrinology, thyroid up  take scheduled. Continue collaboration with endocrinology.      Type 2 diabetes mellitus with diabetic polyneuropathy, without long-term current use of insulin (HCC) - Primary   Chronic, ongoing. Alc 6.3% today. Continue current medication regimen Metformin XR 1000 mg BID. Focus on diet of smaller high protein, low fat meals more frequently and daily exercise. Continue Gabapentin daily for neuropathy, 600 mg in the morning and late afternoon, and 900 mg at HS. Continue Duloxetine 60 mg daily. Continue to monitor feet for wounds and breakdown. Follow up in 3 months.      Relevant Orders   Bayer DCA Hb A1c Waived (Completed)   Microalbumin, Urine Waived (Completed)     Other   H/O laminectomy   Back and leg pain are improving. Reports 5/10 pain in office today. Plans to restart ASA therapy. Follow up in 3 months.      Relevant Orders   CBC with Differential/Platelet   Morbid obesity (HCC)   BMI 38.06. Continued recommendation of eating smaller high protein, low fat meals more frequently and exercising 5 times per week 30 mins per day. Follow up in 3 months.        Follow up plan: Return in about 3 months (around 12/24/2023) for T2DM, HTN/HLD, HYPERTHYROID.

## 2023-09-24 NOTE — Progress Notes (Deleted)
 BP 96/64   Pulse 92   Temp 98.7 F (37.1 C) (Oral)   Ht 5' 2.9" (1.598 m)   Wt 214 lb 3.2 oz (97.2 kg)   LMP  (LMP Unknown)   SpO2 97%   BMI 38.06 kg/m    Subjective:    [name], female    DOB: Oct 18, 1945, 78 y.o.   MRN: [id number]  HPI: Catherine Hinton is a 78 y.o. female  Chief Complaint  Patient presents with   Diabetes   Hyperlipidemia   Hypertension   DIABETES A1c 6.2% January.  Takes Metformin 1000 MG BID + Gabapentin & Duloxetine for neuropathy which offers benefit.  Saw neurologist on 11/13/22 for neuropathy. 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  Recent lumbar laminectomy on 08/26/23 due to radiculopathy and right leg weakness, is continuing to recover.  Last saw neurosurgery on 09/10/23. Hypoglycemic episodes:{Blank single:19197::"yes","no"} Polydipsia/polyuria: {Blank single:19197::"yes","no"} Visual disturbance: {Blank single:19197::"yes","no"} Chest pain: {Blank single:19197::"yes","no"} Paresthesias: {Blank single:19197::"yes","no"} Glucose Monitoring: {Blank single:19197::"yes","no"}  Accucheck frequency: {Blank single:19197::"Not Checking","Daily","BID","TID"}  Fasting glucose:  Post prandial:  Evening:  Before meals: Taking Insulin?: {Blank single:19197::"yes","no"}  Long acting insulin:  Short acting insulin: Blood Pressure Monitoring: {Blank single:19197::"not checking","rarely","daily","weekly","monthly","a few times a day","a few times a week","a few times a month"} Retinal Examination: {Blank single:19197::"Up to Date","Not up to Date"} Foot Exam: {Blank single:19197::"Up to Date","Not up to Date"} Diabetic Education: {Blank single:19197::"Completed","Not Completed"} Pneumovax: {Blank single:19197::"Up to Date","Not up to Date","unknown"} Influenza: {Blank single:19197::"Up to Date","Not up to Date","unknown"} Aspirin: {Blank single:19197::"yes","no"}    HYPERTENSION / HYPERLIPIDEMIA Continues Metoprolol, Valsartan-HCTZ, ASA and Crestor. CPAP nightly -- last sleep study 11/14/22, moderates OSA.  Satisfied with current treatment? {Blank single:19197::"yes","no"} Duration of hypertension: {Blank single:19197::"chronic","months","years"} BP monitoring frequency: {Blank single:19197::"not checking","rarely","daily","weekly","monthly","a few times a day","a few times a week","a few times a month"} BP range:  BP medication side effects: {Blank single:19197::"yes","no"} Duration of hyperlipidemia: {Blank single:19197::"chronic","months","years"} Cholesterol medication side effects: {Blank single:19197::"yes","no"} Cholesterol supplements: {Blank multiple:19196::"none","fish oil","niacin","red yeast rice"} Medication compliance: {Blank single:19197::"excellent compliance","good compliance","fair compliance","poor compliance"} Aspirin: {Blank single:19197::"yes","no"} Recent stressors: {Blank single:19197::"yes","no"} Recurrent headaches: {Blank single:19197::"yes","no"} Visual changes: {Blank single:19197::"yes","no"} Palpitations: {Blank single:19197::"yes","no"} Dyspnea: {Blank single:19197::"yes","no"} Chest pain: {Blank single:19197::"yes","no"} Lower extremity edema: {Blank single:19197::"yes","no"} Dizzy/lightheaded: {Blank single:19197::"yes","no"}  The ASCVD Risk score (Arnett DK, et al., 2019) failed to calculate for the following reasons:   The valid total cholesterol range is 130 to 320 mg/dL  HYPERTHYROIDISM Saw [facility] endocrinology 09/18/23. Takes Metoprolol for HR control.  Does have upper tremor at baseline.  Had thyroid ultrasound with endo and one new small nodule noted, but no biopsy at time.  Scheduled for repeat testing. Thyroid control status:{Blank single:19197::"controlled","uncontrolled","better","worse","exacerbated","stable"} Satisfied with current treatment? {Blank single:19197::"yes","no"} Medication side  effects: {Blank single:19197::"yes","no"} Medication compliance: {Blank single:19197::"excellent compliance","good compliance","fair compliance","poor compliance"} Etiology of hypothyroidism:  Recent dose adjustment:{Blank single:19197::"yes","no"} Fatigue: {Blank single:19197::"yes","no"} Cold intolerance: {Blank single:19197::"yes","no"} Heat intolerance: {Blank single:19197::"yes","no"} Weight gain: {Blank single:19197::"yes","no"} Weight loss: {Blank single:19197::"yes","no"} Constipation: {Blank single:19197::"yes","no"} Diarrhea/loose stools: {Blank single:19197::"yes","no"} Palpitations: {Blank single:19197::"yes","no"} Lower extremity edema: {Blank single:19197::"yes","no"} Anxiety/depressed mood: {Blank single:19197::"yes","no"}   Relevant past medical, surgical, family and social history reviewed and updated as indicated. Interim medical history since our last visit reviewed. Allergies and medications reviewed and updated.  Review of Systems  Per HPI unless specifically indicated above     Objective:    BP 96/64   Pulse 92   Temp 98.7 F (37.1 C) (Oral)   Ht 5' 2.9" (1.598 m)   Wt 214 lb  3.2 oz (97.2 kg)   LMP  (LMP Unknown)   SpO2 97%   BMI 38.06 kg/m   Wt Readings from Last 3 Encounters:  09/24/23 214 lb 3.2 oz (97.2 kg)  09/10/23 208 lb (94.3 kg)  08/26/23 208 lb 5.4 oz (94.5 kg)    Physical Exam  Results for orders placed or performed during the hospital encounter of 08/26/23  Glucose, capillary   Collection Time: 08/26/23  6:18 AM  Result Value Ref Range   Glucose-Capillary 129 (H) 70 - 99 mg/dL  ABO/Rh   Collection Time: 08/26/23  6:34 AM  Result Value Ref Range   ABO/RH(D)      A POS Performed at [facility], [address] [place], [place] [address]   Glucose, capillary   Collection Time: 08/26/23  8:53 AM  Result Value Ref Range   Glucose-Capillary 154 (H) 70 - 99 mg/dL      Assessment & Plan:   Problem List Items Addressed  This Visit       Cardiovascular and Mediastinum   Hypertension associated with diabetes (HCC)   Relevant Orders   Bayer DCA Hb A1c Waived   Microalbumin, Urine Waived   Aortic atherosclerosis (HCC)     Respiratory   Obstructive sleep apnea     Endocrine   Diabetes mellitus treated with oral medication (HCC)   Hyperlipidemia associated with type 2 diabetes mellitus (HCC)   Relevant Orders   Bayer DCA Hb A1c Waived   Comprehensive metabolic panel with GFR   Lipid Panel w/o Chol/HDL Ratio   Hyperthyroidism   Type 2 diabetes mellitus with diabetic polyneuropathy, without long-term current use of insulin (HCC) - Primary   Relevant Orders   Bayer DCA Hb A1c Waived   Microalbumin, Urine Waived     Other   H/O laminectomy   Relevant Orders   CBC with Differential/Platelet   Morbid obesity (HCC)     Follow up plan: Return in about 3 months (around 12/24/2023) for T2DM, HTN/HLD, HYPERTHYROID.

## 2023-09-24 NOTE — Assessment & Plan Note (Signed)
 Chronic, ongoing. Currently no medication on board. Followed by endocrinology, thyroid up take scheduled. Continue collaboration with endocrinology.

## 2023-09-24 NOTE — Assessment & Plan Note (Signed)
 Chronic, ongoing. Alc 6.3% today. Continue current medication regimen Metformin XR 1000 mg BID. Focus on diet of smaller high protein, low fat meals more frequently and daily exercise. Continue Gabapentin daily for neuropathy, 600 mg in the morning and late afternoon, and 900 mg at HS. Continue Duloxetine 60 mg daily. Continue to monitor feet for wounds and breakdown. Follow up in 3 months.

## 2023-09-24 NOTE — Assessment & Plan Note (Signed)
 Chronic, stable. Continue current medication regimen Crestor 20 mg daily. Lipid panel today. Follow up in 3 months.

## 2023-09-24 NOTE — Assessment & Plan Note (Signed)
 BMI 38.06. Continued recommendation of eating smaller high protein, low fat meals more frequently and exercising 5 times per week 30 mins per day. Follow up in 3 months.

## 2023-09-24 NOTE — Assessment & Plan Note (Signed)
Chronic, stable.  Continue nightly use, praised for this.

## 2023-09-25 ENCOUNTER — Encounter: Payer: Self-pay | Admitting: Nurse Practitioner

## 2023-09-25 LAB — COMPREHENSIVE METABOLIC PANEL WITH GFR
ALT: 18 IU/L (ref 0–32)
AST: 25 IU/L (ref 0–40)
Albumin: 4.2 g/dL (ref 3.8–4.8)
Alkaline Phosphatase: 57 IU/L (ref 44–121)
BUN/Creatinine Ratio: 16 (ref 12–28)
BUN: 9 mg/dL (ref 8–27)
Bilirubin Total: 0.3 mg/dL (ref 0.0–1.2)
CO2: 26 mmol/L (ref 20–29)
Calcium: 9.6 mg/dL (ref 8.7–10.3)
Chloride: 99 mmol/L (ref 96–106)
Creatinine, Ser: 0.57 mg/dL (ref 0.57–1.00)
Globulin, Total: 1.8 g/dL (ref 1.5–4.5)
Glucose: 182 mg/dL — ABNORMAL HIGH (ref 70–99)
Potassium: 4.1 mmol/L (ref 3.5–5.2)
Sodium: 143 mmol/L (ref 134–144)
Total Protein: 6 g/dL (ref 6.0–8.5)
eGFR: 95 mL/min/1.73

## 2023-09-25 LAB — CBC WITH DIFFERENTIAL/PLATELET
Basophils Absolute: 0 x10E3/uL (ref 0.0–0.2)
Basos: 1 %
EOS (ABSOLUTE): 0.2 x10E3/uL (ref 0.0–0.4)
Eos: 3 %
Hematocrit: 38.9 % (ref 34.0–46.6)
Hemoglobin: 12.3 g/dL (ref 11.1–15.9)
Immature Grans (Abs): 0 x10E3/uL (ref 0.0–0.1)
Immature Granulocytes: 0 %
Lymphocytes Absolute: 2 x10E3/uL (ref 0.7–3.1)
Lymphs: 38 %
MCH: 27.6 pg (ref 26.6–33.0)
MCHC: 31.6 g/dL (ref 31.5–35.7)
MCV: 87 fL (ref 79–97)
Monocytes Absolute: 0.3 x10E3/uL (ref 0.1–0.9)
Monocytes: 6 %
Neutrophils Absolute: 2.7 x10E3/uL (ref 1.4–7.0)
Neutrophils: 52 %
Platelets: 267 x10E3/uL (ref 150–450)
RBC: 4.45 x10E6/uL (ref 3.77–5.28)
RDW: 14.1 % (ref 11.7–15.4)
WBC: 5.2 x10E3/uL (ref 3.4–10.8)

## 2023-09-25 LAB — LIPID PANEL W/O CHOL/HDL RATIO
Cholesterol, Total: 88 mg/dL — ABNORMAL LOW (ref 100–199)
HDL: 44 mg/dL
LDL Chol Calc (NIH): 18 mg/dL (ref 0–99)
Triglycerides: 156 mg/dL — ABNORMAL HIGH (ref 0–149)
VLDL Cholesterol Cal: 26 mg/dL (ref 5–40)

## 2023-09-25 NOTE — Progress Notes (Signed)
 Contacted via MyChart   Good morning Okey Regal, your labs have returned and overall these remain stable.  No medication changes needed.  Any questions? Keep being stellar!!  Thank you for allowing me to participate in your care.  I appreciate you. Kindest regards, Tenia Goh

## 2023-10-04 ENCOUNTER — Encounter: Payer: Self-pay | Admitting: Neurosurgery

## 2023-10-07 ENCOUNTER — Encounter
Admission: RE | Admit: 2023-10-07 | Discharge: 2023-10-07 | Disposition: A | Discharge status: Home / Self Care | Source: Ambulatory Visit | Attending: Endocrinology | Admitting: Endocrinology

## 2023-10-07 DIAGNOSIS — E0590 Thyrotoxicosis, unspecified without thyrotoxic crisis or storm: Secondary | ICD-10-CM | POA: Insufficient documentation

## 2023-10-07 MED ORDER — SODIUM IODIDE I-123 7.4 MBQ CAPS
421.8000 | ORAL_CAPSULE | Freq: Once | ORAL | Status: AC
  Administered 2023-10-07: 421.8 via ORAL

## 2023-10-08 ENCOUNTER — Encounter
Admission: RE | Admit: 2023-10-08 | Discharge: 2023-10-08 | Disposition: A | Discharge status: Home / Self Care | Source: Ambulatory Visit | Attending: Endocrinology | Admitting: Endocrinology

## 2023-10-08 DIAGNOSIS — E0590 Thyrotoxicosis, unspecified without thyrotoxic crisis or storm: Secondary | ICD-10-CM | POA: Insufficient documentation

## 2023-10-10 ENCOUNTER — Encounter: Payer: Self-pay | Admitting: Neurosurgery

## 2023-10-10 ENCOUNTER — Ambulatory Visit (INDEPENDENT_AMBULATORY_CARE_PROVIDER_SITE_OTHER): Admitting: Neurosurgery

## 2023-10-10 VITALS — BP 120/72 | Temp 99.1°F | Ht 62.9 in | Wt 214.0 lb

## 2023-10-10 DIAGNOSIS — M5416 Radiculopathy, lumbar region: Secondary | ICD-10-CM

## 2023-10-10 DIAGNOSIS — Z9889 Other specified postprocedural states: Secondary | ICD-10-CM

## 2023-10-10 DIAGNOSIS — Z09 Encounter for follow-up examination after completed treatment for conditions other than malignant neoplasm: Secondary | ICD-10-CM

## 2023-10-10 NOTE — Progress Notes (Signed)
   REFERRING PHYSICIAN:  Raford Bunk [address] [place],  [place] [address]  DOS: 08/26/23 right L2-L3 microdiscectomy  HISTORY OF PRESENT ILLNESS: Catherine  Hinton is status post above surgery.  She is doing very well.  She has minimal pain.    PHYSICAL EXAMINATION:  General: Patient is well developed, well nourished, calm, collected, and in no apparent distress.   NEUROLOGICAL:  General: In no acute distress.   Awake, alert, oriented to person, place, and time.  Pupils equal round and reactive to light.  Facial tone is symmetric.     Strength:          Side Iliopsoas Quads Hamstring PF DF EHL  R 5 5 5 5 5 5   L 5 5 5 5 5 5    Incision c/d/i   ROS (Neurologic):  Negative except as noted above  IMAGING: Nothing new to review.   ASSESSMENT/PLAN:  Catherine  Hinton is doing well s/p above surgery.  I am very pleased with her improvements.  We reviewed her activity limitations.  She will return to clinic in 6 weeks.  I am very pleased with her response to surgery.     Jodeen Munch MD Department of neurosurgery

## 2023-11-11 NOTE — Progress Notes (Unsigned)
   REFERRING PHYSICIAN:  Raford Bunk [address] [place],  [place] [address]  DOS: 08/26/23 right L2-L3 microdiscectomy  HISTORY OF PRESENT ILLNESS:  She was doing well at her last visit.   She continues to do well. No right leg pain. No numbness, tingling, or weakness. She has some intermittent lower back soreness with increased activity. This is tolerable.    PHYSICAL EXAMINATION:  General: Patient is well developed, well nourished, calm, collected, and in no apparent distress.   NEUROLOGICAL:  General: In no acute distress.   Awake, alert, oriented to person, place, and time.  Pupils equal round and reactive to light.  Facial tone is symmetric.     Strength:          Side Iliopsoas Quads Hamstring PF DF EHL  R 5 5 5 5 5 5   L 5 5 5 5 5 5    Incision well healed   ROS (Neurologic):  Negative except as noted above  IMAGING: Nothing new to review.   ASSESSMENT/PLAN:  [place]  Carvey is doing well s/p above surgery. Treatment options reviewed with patient and following plan made:   - She can slowly return to activity as tolerated.  - She will f/u prn.   Advised to contact the office if any questions or concerns arise.  Lucetta Russel PA-C Department of neurosurgery

## 2023-11-14 ENCOUNTER — Ambulatory Visit (INDEPENDENT_AMBULATORY_CARE_PROVIDER_SITE_OTHER): Admitting: Orthopedic Surgery

## 2023-11-14 ENCOUNTER — Encounter: Payer: Self-pay | Admitting: Orthopedic Surgery

## 2023-11-14 VITALS — BP 122/82 | Temp 98.6°F | Ht 62.9 in | Wt 214.0 lb

## 2023-11-14 DIAGNOSIS — Z9889 Other specified postprocedural states: Secondary | ICD-10-CM

## 2023-11-14 DIAGNOSIS — M5416 Radiculopathy, lumbar region: Secondary | ICD-10-CM

## 2023-11-14 DIAGNOSIS — M5126 Other intervertebral disc displacement, lumbar region: Secondary | ICD-10-CM

## 2023-12-03 ENCOUNTER — Other Ambulatory Visit: Payer: Self-pay | Admitting: Nurse Practitioner

## 2023-12-05 NOTE — Telephone Encounter (Signed)
 Requested Prescriptions  Pending Prescriptions Disp Refills   gabapentin  (NEURONTIN ) 300 MG capsule [Pharmacy Med Name: Gabapentin  Oral Capsule 300 MG] 530 capsule 1    Sig: TAKE 2 CAPSULES EVERY MORNING, AND 2 CAPSULES AT 2PM, AND 3 CAPSULES EVERY NIGHT BEFORE BEDTIME     Neurology: Anticonvulsants - gabapentin  Passed - 12/05/2023  2:02 PM      Passed - Cr in normal range and within 360 days    Creatinine, Ser  Date Value Ref Range Status  09/24/2023 0.57 0.57 - 1.00 mg/dL Final         Passed - Completed PHQ-2 or PHQ-9 in the last 360 days      Passed - Valid encounter within last 12 months    Recent Outpatient Visits           2 months ago Type 2 diabetes mellitus with diabetic polyneuropathy, without long-term current use of insulin (HCC)   [facility] [place], [place] T, NP   3 months ago Acute pain of right knee   [facility] Hadassah Letters, MD

## 2023-12-11 ENCOUNTER — Other Ambulatory Visit: Payer: Self-pay | Admitting: Nurse Practitioner

## 2023-12-12 NOTE — Telephone Encounter (Signed)
 Requested Prescriptions  Pending Prescriptions Disp Refills   DULoxetine  (CYMBALTA ) 60 MG capsule [Pharmacy Med Name: DULoxetine  HCl Oral Capsule Delayed Release Particles 60 MG] 90 capsule 1    Sig: TAKE 1 CAPSULE EVERY DAY     Psychiatry: Antidepressants - SNRI - duloxetine  Passed - 12/12/2023  2:08 PM      Passed - Cr in normal range and within 360 days    Creatinine, Ser  Date Value Ref Range Status  09/24/2023 0.57 0.57 - 1.00 mg/dL Final         Passed - eGFR is 30 or above and within 360 days    GFR calc Af Amer  Date Value Ref Range Status  07/12/2020 105 >59 mL/min/1.73 Final    Comment:    **In accordance with recommendations from the NKF-ASN Task force,**   [facility] is in the process of updating its eGFR calculation to the   2021 CKD-EPI creatinine equation that estimates kidney function   without a race variable.    GFR, Estimated  Date Value Ref Range Status  08/23/2023 >60 >60 mL/min Final    Comment:    (NOTE) Calculated using the CKD-EPI Creatinine Equation (2021)    eGFR  Date Value Ref Range Status  09/24/2023 95 >59 mL/min/1.73 Final         Passed - Completed PHQ-2 or PHQ-9 in the last 360 days      Passed - Last BP in normal range    BP Readings from Last 1 Encounters:  11/14/23 122/82         Passed - Valid encounter within last 6 months    Recent Outpatient Visits           2 months ago Type 2 diabetes mellitus with diabetic polyneuropathy, without long-term current use of insulin (HCC)   [place] [facility] [place], [place] T, NP   4 months ago Acute pain of right knee   [place] [facility] [place] Hadassah SQUIBB, MD

## 2023-12-21 NOTE — Patient Instructions (Signed)
Be Involved in Caring For Your Health:  Taking Medications When medications are taken as directed, they can greatly improve your health. But if they are not taken as prescribed, they may not work. In some cases, not taking them correctly can be harmful. To help ensure your treatment remains effective and safe, understand your medications and how to take them. Bring your medications to each visit for review by your provider.  Your lab results, notes, and after visit summary will be available on My Chart. We strongly encourage you to use this feature. If lab results are abnormal the clinic will contact you with the appropriate steps. If the clinic does not contact you assume the results are satisfactory. You can always view your results on My Chart. If you have questions regarding your health or results, please contact the clinic during office hours. You can also ask questions on My Chart.  We at [facility] are grateful that you chose [place] to provide your care. We strive to provide evidence-based and compassionate care and are always looking for feedback. If you get a survey from the clinic please complete this so we can hear your opinions.  Diabetes Mellitus and Exercise Regular exercise is important for your health, especially if you have diabetes mellitus. Exercise is not just about losing weight. It can also help you increase muscle strength and bone density and reduce body fat and stress. This can help your level of endurance and make you more fit and flexible. Why should I exercise if I have diabetes? Exercise has many benefits for people with diabetes. It can: Help lower and control your blood sugar (glucose). Help your body respond better and become more sensitive to the hormone insulin. Reduce how much insulin your body needs. Lower your risk for heart disease by: Lowering how much "bad" cholesterol and triglycerides you have in your body. Increasing how much "good" cholesterol  you have in your body. Lowering your blood pressure. Lowering your blood glucose levels. What is my activity plan? Your health care provider or an expert trained in diabetes care (certified diabetes educator) can help you make an activity plan. This plan can help you find the type of exercise that works for you. It may also tell you how often to exercise and for how long. Be sure to: Get at least 150 minutes of medium-intensity or high-intensity exercise each week. This may involve brisk walking, biking, or water aerobics. Do stretching and strengthening exercises at least 2 times a week. This may involve yoga or weight lifting. Spread out your activity over at least 3 days of the week. Get some form of physical activity each day. Do not go more than 2 days in a row without some kind of activity. Avoid being inactive for more than 30 minutes at a time. Take frequent breaks to walk or stretch. Choose activities that you enjoy. Set goals that you know you can accomplish. Start slowly and increase the intensity of your exercise over time. How do I manage my diabetes during exercise?  Monitor your blood glucose Check your blood glucose before and after you exercise. If your blood glucose is 240 mg/dL (40.9 mmol/L) or higher before you exercise, check your urine for ketones. These are chemicals created by the liver. If you have ketones in your urine, do not exercise until your blood glucose returns to normal. If your blood glucose is 100 mg/dL (5.6 mmol/L) or lower, eat a snack that has 15-20 grams of carbohydrate in  it. Check your blood glucose 15 minutes after the snack to make sure that your level is above 100 mg/dL (5.6 mmol/L) before you start to exercise. Your risk for low blood glucose (hypoglycemia) goes up during and after exercise. Know the symptoms of this condition and how to treat it. Follow these instructions at home: Keep a carbohydrate snack on hand for use before, during, and after  exercise. This can help prevent or treat hypoglycemia. Avoid injecting insulin into parts of your body that are going to be used during exercise. This may include: Your arms, when you are going to play tennis. Your legs, when you are about to go jogging. Keep track of your exercise habits. This can help you and your health care provider watch and adjust your activity plan. Write down: What you eat before and after you exercise. Blood glucose levels before and after you exercise. The type and amount of exercise you do. Talk to your health care provider before you start a new activity. They may need to: Make sure that the activity is safe for you. Adjust your insulin, other medicines, and food that you eat. Drink water while you exercise. This can stop you from losing too much water (dehydration). It can also prevent problems caused by having a lot of heat in your body (heat stroke). Where to find more information American Diabetes Association: diabetes.org Association of Diabetes Care & Education Specialists: diabeteseducator.org This information is not intended to replace advice given to you by your health care provider. Make sure you discuss any questions you have with your health care provider. Document Revised: 11/22/2021 Document Reviewed: 11/22/2021 Elsevier Patient Education  2024 [employer].

## 2023-12-25 ENCOUNTER — Encounter: Payer: Self-pay | Admitting: Nurse Practitioner

## 2023-12-25 ENCOUNTER — Ambulatory Visit (INDEPENDENT_AMBULATORY_CARE_PROVIDER_SITE_OTHER): Admitting: Nurse Practitioner

## 2023-12-25 VITALS — BP 119/70 | HR 97 | Temp 98.9°F | Ht 63.0 in | Wt 211.6 lb

## 2023-12-25 DIAGNOSIS — E1159 Type 2 diabetes mellitus with other circulatory complications: Secondary | ICD-10-CM | POA: Diagnosis not present

## 2023-12-25 DIAGNOSIS — I700 Atherosclerosis of aorta: Secondary | ICD-10-CM

## 2023-12-25 DIAGNOSIS — E1169 Type 2 diabetes mellitus with other specified complication: Secondary | ICD-10-CM | POA: Diagnosis not present

## 2023-12-25 DIAGNOSIS — E785 Hyperlipidemia, unspecified: Secondary | ICD-10-CM

## 2023-12-25 DIAGNOSIS — E0590 Thyrotoxicosis, unspecified without thyrotoxic crisis or storm: Secondary | ICD-10-CM

## 2023-12-25 DIAGNOSIS — E1142 Type 2 diabetes mellitus with diabetic polyneuropathy: Secondary | ICD-10-CM | POA: Diagnosis not present

## 2023-12-25 DIAGNOSIS — I152 Hypertension secondary to endocrine disorders: Secondary | ICD-10-CM

## 2023-12-25 DIAGNOSIS — E119 Type 2 diabetes mellitus without complications: Secondary | ICD-10-CM

## 2023-12-25 DIAGNOSIS — Z7984 Long term (current) use of oral hypoglycemic drugs: Secondary | ICD-10-CM

## 2023-12-25 DIAGNOSIS — G4733 Obstructive sleep apnea (adult) (pediatric): Secondary | ICD-10-CM

## 2023-12-25 LAB — BAYER DCA HB A1C WAIVED: HB A1C (BAYER DCA - WAIVED): 6.8 % — ABNORMAL HIGH (ref 4.8–5.6)

## 2023-12-25 NOTE — Progress Notes (Signed)
 BP 119/70   Pulse 97   Temp 98.9 F (37.2 C) (Oral)   Ht 5' 3 (1.6 m)   Wt 211 lb 9.6 oz (96 kg)   LMP  (LMP Unknown)   SpO2 95%   BMI 37.48 kg/m    Subjective:    Patient ID: Catherine  Hinton, female    DOB: 13-Nov-1945, 78 y.o.   MRN: [id number]  HPI: [name] is a 78 y.o. female    Chief Complaint  Patient presents with   Diabetes   Hyperlipidemia   Hypertension   Hyperthyroidism   DIABETES April A1c was 6.3%. Continues Metformin  1000 MG BID. Taking Gabapentin  & Duloxetine  for neuropathy which offers benefit.  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  Last visit with them was on 11/13/22.  Lumbar laminectomy on 08/26/23 due to radiculopathy and right leg weakness, overall has been healing well per her report. Saw neurosurgery last on 11/14/23. Hypoglycemic episodes:no Polydipsia/polyuria: no Visual disturbance: no Chest pain: no Paresthesias: no Glucose Monitoring: yes  Accucheck frequency: Daily  Fasting glucose: 115 this morning, usually around 120  Post prandial:  Evening:  Before meals: Taking Insulin?: no  Long acting insulin:  Short acting insulin: Blood Pressure Monitoring: a few times a week Retinal Examination: Up to Date Foot Exam: Up to Date Diabetic Education: Completed Pneumovax: Up to Date Influenza: Up to Date Aspirin: yes   HYPERTENSION / HYPERLIPIDEMIA Taking Metoprolol , Valsartan -HCTZ, ASA and Crestor . CPAP nightly -- last sleep study 11/14/22 with moderate OSA, uses CPAP nightly. Aortic atherosclerosis noted on past imaging. Satisfied with current treatment? yes Duration of hypertension: years BP monitoring frequency: a few times a week BP range: 110/70 range BP medication side effects: no Duration of hyperlipidemia: years Cholesterol medication side effects: no Cholesterol supplements: fish oil Medication compliance: good compliance Aspirin:  yes Recent stressors: no Recurrent headaches: no Visual changes: no Palpitations: no Dyspnea: no Chest pain: no Lower extremity edema: very little at times Dizzy/lightheaded: no  The ASCVD Risk score (Arnett DK, et al., 2019) failed to calculate for the following reasons:   The valid total cholesterol range is 130 to 320 mg/dL  HYPERTHYROIDISM Follows with [facility] endocrinology, last visit was 09/18/23. Takes Metoprolol  for HR control.  Does have upper tremor at baseline. Recent imaging on 10/07/23 was stable. Thyroid  control status:uncontrolled Satisfied with current treatment? no Medication side effects: no Medication compliance: good compliance Recent dose adjustment:no Fatigue: no Cold intolerance: no Heat intolerance: yes Weight gain: no Weight loss: no Constipation: occasional constipation Diarrhea/loose stools: no Palpitations: no Lower extremity edema: no Anxiety/depressed mood: no      09/24/2023   10:48 AM 08/12/2023    1:43 PM 06/20/2023    9:59 AM 04/23/2023    9:09 AM 03/12/2023   10:01 AM  Depression screen PHQ 2/9  Decreased Interest 0 0 0 0 0  Down, Depressed, Hopeless 0 0 0 0 0  PHQ - 2 Score 0 0 0 0 0  Altered sleeping 0 2 1 0 1  Tired, decreased energy 3 2 2  0 1  Change in appetite 2 2 1  0 2  Feeling bad or failure about yourself  0 0 0 0 0  Trouble concentrating 0 0 0 0 0  Moving slowly or fidgety/restless 0 0 0 0 0  Suicidal thoughts 0 0 0 0 0  PHQ-9 Score 5 6 4  0 4  Difficult doing work/chores Not difficult at all  Somewhat difficult Not difficult at all Not difficult at all       09/24/2023   10:49 AM 08/12/2023    1:47 PM 06/20/2023   10:00 AM 03/12/2023   10:02 AM  GAD 7 : Generalized Anxiety Score  Nervous, Anxious, on Edge 1 2 1  0  Control/stop worrying 0 0 1 0  Worry too much - different things 0 0 1 0  Trouble relaxing 0 2 1 0  Restless 0 2 0 0  Easily annoyed or irritable 0 0 0 0  Afraid - awful might happen 0 0 0 0  Total GAD 7 Score 1  6 4  0  Anxiety Difficulty Somewhat difficult Somewhat difficult Somewhat difficult Not difficult at all   Relevant past medical, surgical, family and social history reviewed and updated as indicated. Interim medical history since our last visit reviewed. Allergies and medications reviewed and updated.  Review of Systems  Constitutional:  Positive for fatigue. Negative for activity change, appetite change, diaphoresis and fever.  Respiratory:  Negative for cough, chest tightness, shortness of breath and wheezing.   Cardiovascular:  Negative for chest pain, palpitations and leg swelling.  Gastrointestinal: Negative.   Endocrine: Positive for heat intolerance. Negative for cold intolerance, polydipsia, polyphagia and polyuria.  Neurological: Negative.   Psychiatric/Behavioral: Negative.     Per HPI unless specifically indicated above     Objective:    BP 119/70   Pulse 97   Temp 98.9 F (37.2 C) (Oral)   Ht 5' 3 (1.6 m)   Wt 211 lb 9.6 oz (96 kg)   LMP  (LMP Unknown)   SpO2 95%   BMI 37.48 kg/m   Wt Readings from Last 3 Encounters:  12/25/23 211 lb 9.6 oz (96 kg)  11/14/23 214 lb (97.1 kg)  10/10/23 214 lb (97.1 kg)    Physical Exam Vitals and nursing note reviewed.  Constitutional:      General: She is awake. She is not in acute distress.    Appearance: She is well-developed. She is obese. She is not ill-appearing.  HENT:     Head: Normocephalic.     Right Ear: Hearing, tympanic membrane and ear canal normal. No drainage.     Left Ear: Hearing, tympanic membrane, ear canal and external ear normal. No drainage.     Nose: Nose normal.     Mouth/Throat:     Mouth: Mucous membranes are moist.     Pharynx: Oropharynx is clear.  Eyes:     General: Lids are normal.        Right eye: No discharge.        Left eye: No discharge.     Conjunctiva/sclera: Conjunctivae normal.     Pupils: Pupils are equal, round, and reactive to light.  Neck:     Vascular: No carotid bruit.   Cardiovascular:     Rate and Rhythm: Normal rate and regular rhythm.     Heart sounds: Normal heart sounds. No murmur heard.    No gallop.  Pulmonary:     Effort: Pulmonary effort is normal. No accessory muscle usage or respiratory distress.     Breath sounds: Normal breath sounds.  Abdominal:     General: Bowel sounds are normal.     Palpations: Abdomen is soft.  Musculoskeletal:     Cervical back: Normal range of motion and neck supple.     Right lower leg: No edema.     Left lower leg: No edema.  Lymphadenopathy:  Cervical: No cervical adenopathy.  Skin:    General: Skin is warm and dry.  Neurological:     Mental Status: She is alert and oriented to person, place, and time.     Motor: Tremor present.  Psychiatric:        Attention and Perception: Attention normal.        Mood and Affect: Mood normal.        Behavior: Behavior normal. Behavior is cooperative.        Thought Content: Thought content normal.        Judgment: Judgment normal.    Results for orders placed or performed in visit on 09/24/23  Bayer DCA Hb A1c Waived   Collection Time: 09/24/23 10:48 AM  Result Value Ref Range   HB A1C (BAYER DCA - WAIVED) 6.3 (H) 4.8 - 5.6 %  Microalbumin, Urine Waived   Collection Time: 09/24/23 10:48 AM  Result Value Ref Range   Microalb, Ur Waived 150 (H) 0 - 19 mg/L   Creatinine, Urine Waived 100 10 - 300 mg/dL   Microalb/Creat Ratio 30-300 (H) <30 mg/g  Comprehensive metabolic panel with GFR   Collection Time: 09/24/23 10:49 AM  Result Value Ref Range   Glucose 182 (H) 70 - 99 mg/dL   BUN 9 8 - 27 mg/dL   Creatinine, Ser 9.42 0.57 - 1.00 mg/dL   eGFR 95 >40 fO/fpw/8.26   BUN/Creatinine Ratio 16 12 - 28   Sodium 143 134 - 144 mmol/L   Potassium 4.1 3.5 - 5.2 mmol/L   Chloride 99 96 - 106 mmol/L   CO2 26 20 - 29 mmol/L   Calcium  9.6 8.7 - 10.3 mg/dL   Total Protein 6.0 6.0 - 8.5 g/dL   Albumin 4.2 3.8 - 4.8 g/dL   Globulin, Total 1.8 1.5 - 4.5 g/dL    Bilirubin Total 0.3 0.0 - 1.2 mg/dL   Alkaline Phosphatase 57 44 - 121 IU/L   AST 25 0 - 40 IU/L   ALT 18 0 - 32 IU/L  Lipid Panel w/o Chol/HDL Ratio   Collection Time: 09/24/23 10:49 AM  Result Value Ref Range   Cholesterol, Total 88 (L) 100 - 199 mg/dL   Triglycerides 843 (H) 0 - 149 mg/dL   HDL 44 >60 mg/dL   VLDL Cholesterol Cal 26 5 - 40 mg/dL   LDL Chol Calc (NIH) 18 0 - 99 mg/dL  CBC with Differential/Platelet   Collection Time: 09/24/23 10:49 AM  Result Value Ref Range   WBC 5.2 3.4 - 10.8 x10E3/uL   RBC 4.45 3.77 - 5.28 x10E6/uL   Hemoglobin 12.3 11.1 - 15.9 g/dL   Hematocrit 61.0 65.9 - 46.6 %   MCV 87 79 - 97 fL   MCH 27.6 26.6 - 33.0 pg   MCHC 31.6 31.5 - 35.7 g/dL   RDW 85.8 88.2 - 84.5 %   Platelets 267 150 - 450 x10E3/uL   Neutrophils 52 Not Estab. %   Lymphs 38 Not Estab. %   Monocytes 6 Not Estab. %   Eos 3 Not Estab. %   Basos 1 Not Estab. %   Neutrophils Absolute 2.7 1.4 - 7.0 x10E3/uL   Lymphocytes Absolute 2.0 0.7 - 3.1 x10E3/uL   Monocytes Absolute 0.3 0.1 - 0.9 x10E3/uL   EOS (ABSOLUTE) 0.2 0.0 - 0.4 x10E3/uL   Basophils Absolute 0.0 0.0 - 0.2 x10E3/uL   Immature Granulocytes 0 Not Estab. %   Immature Grans (Abs) 0.0 0.0 - 0.1 x10E3/uL  Assessment & Plan:   Problem List Items Addressed This Visit       Cardiovascular and Mediastinum   Hypertension associated with diabetes (HCC)   Chronic, stable.  BP well below goal for age today.  Continue Valsartan -HCTZ 80 MG-12.5 MG daily and Metoprolol  XL 50 MG daily due to tachycardia (thyroid  related).  LABS: CMP.  Urine ALB 150 April 2025. Could consider discontinuation of HCTZ at upcoming visits, as BP well controlled and history of gout.  DASH diet focus at home and monitor BPs, bring levels to visit.        Relevant Orders   Bayer DCA Hb A1c Waived   Aortic atherosclerosis (HCC)   Ongoing.  Noted on CT 09/18/19.  Continue to monitor and continue ASA + Lopid  for prevention.      Relevant Orders    Comprehensive metabolic panel with GFR   Lipid Panel w/o Chol/HDL Ratio     Respiratory   Obstructive sleep apnea   Chronic, stable.  Continue nightly use, praised for this.          Endocrine   Type 2 diabetes mellitus with diabetic polyneuropathy, without long-term current use of insulin (HCC) - Primary   Chronic, ongoing.  A1c 6.8% today, slight trend up due to recent surgery and steroids, but well at goal. Urine ALB 150 (April 2025).  Will continue Metformin  XR 1000 MG BID and discussed with her may need to adjust regimen if ongoing elevations noted.  Focus on diabetic diet at home and regular exercise.  Continue Gabapentin  daily for neuropathy, 900 MG at HS and 600 MG in morning + 600 MG at evening time + Duloxetine  60 MG daily.  Recommend she monitor feet closely for any wounds or skin breakdown, alert provider immediately if present.  Continue collaboration with neurology.  Return in 3 months.  May need to consider pain management or Lyrica if worsening pain. - Eye exam and foot exam up to date - Vaccines up to date - Statin and ARB on board      Relevant Orders   Bayer DCA Hb A1c Waived   Hyperthyroidism   Chronic, ongoing.  Currently no medication.  Followed by endocrinology.  Continue this, recent notes reviewed.      Hyperlipidemia associated with type 2 diabetes mellitus (HCC)   Chronic, stable.  Tolerating Crestor , will continue this and adjust as needed.  Lipid panel today.      Relevant Orders   Bayer DCA Hb A1c Waived   Comprehensive metabolic panel with GFR   Lipid Panel w/o Chol/HDL Ratio   Diabetes mellitus treated with oral medication (HCC)   Refer to diabetes with neuropathy plan of care.      Relevant Orders   Bayer DCA Hb A1c Waived     Other   Morbid obesity (HCC)   BMI 37.48.  Recommended eating smaller high protein, low fat meals more frequently and exercising 30 mins a day 5 times a week with a goal of 10-15lb weight loss in the next 3 months.  Patient voiced their understanding and motivation to adhere to these recommendations.         Follow up plan: Return in about 3 months (around 03/26/2024) for Annual Physical and diabetes check.

## 2023-12-25 NOTE — Assessment & Plan Note (Addendum)
 Chronic, ongoing.  A1c 6.8% today, slight trend up due to recent surgery and steroids, but well at goal. Urine ALB 150 (April 2025).  Will continue Metformin  XR 1000 MG BID and discussed with her may need to adjust regimen if ongoing elevations noted.  Focus on diabetic diet at home and regular exercise.  Continue Gabapentin  daily for neuropathy, 900 MG at HS and 600 MG in morning + 600 MG at evening time + Duloxetine  60 MG daily.  Recommend she monitor feet closely for any wounds or skin breakdown, alert provider immediately if present.  Continue collaboration with neurology.  Return in 3 months.  May need to consider pain management or Lyrica if worsening pain. - Eye exam and foot exam up to date - Vaccines up to date - Statin and ARB on board

## 2023-12-25 NOTE — Assessment & Plan Note (Signed)
Ongoing.  Noted on CT 09/18/19.  Continue to monitor and continue ASA + Lopid for prevention. 

## 2023-12-25 NOTE — Assessment & Plan Note (Signed)
 Chronic, stable.  BP well below goal for age today.  Continue Valsartan -HCTZ 80 MG-12.5 MG daily and Metoprolol  XL 50 MG daily due to tachycardia (thyroid  related).  LABS: CMP.  Urine ALB 150 April 2025. Could consider discontinuation of HCTZ at upcoming visits, as BP well controlled and history of gout.  DASH diet focus at home and monitor BPs, bring levels to visit.

## 2023-12-25 NOTE — Assessment & Plan Note (Signed)
 Chronic, stable.  Tolerating Crestor, will continue this and adjust as needed.  Lipid panel today.

## 2023-12-25 NOTE — Assessment & Plan Note (Signed)
Chronic, stable.  Continue nightly use, praised for this.

## 2023-12-25 NOTE — Assessment & Plan Note (Signed)
 Chronic, ongoing.  Currently no medication.  Followed by endocrinology.  Continue this, recent notes reviewed.

## 2023-12-25 NOTE — Assessment & Plan Note (Signed)
BMI 37.48.  Recommended eating smaller high protein, low fat meals more frequently and exercising 30 mins a day 5 times a week with a goal of 10-15lb weight loss in the next 3 months. Patient voiced their understanding and motivation to adhere to these recommendations.

## 2023-12-25 NOTE — Assessment & Plan Note (Signed)
 Refer to diabetes with neuropathy plan of care

## 2023-12-26 ENCOUNTER — Ambulatory Visit: Payer: Self-pay | Admitting: Nurse Practitioner

## 2023-12-26 LAB — COMPREHENSIVE METABOLIC PANEL WITH GFR
ALT: 24 IU/L (ref 0–32)
AST: 33 IU/L (ref 0–40)
Albumin: 4.6 g/dL (ref 3.8–4.8)
Alkaline Phosphatase: 64 IU/L (ref 44–121)
BUN/Creatinine Ratio: 16 (ref 12–28)
BUN: 10 mg/dL (ref 8–27)
Bilirubin Total: 0.5 mg/dL (ref 0.0–1.2)
CO2: 21 mmol/L (ref 20–29)
Calcium: 9.9 mg/dL (ref 8.7–10.3)
Chloride: 99 mmol/L (ref 96–106)
Creatinine, Ser: 0.64 mg/dL (ref 0.57–1.00)
Globulin, Total: 2.1 g/dL (ref 1.5–4.5)
Glucose: 193 mg/dL — ABNORMAL HIGH (ref 70–99)
Potassium: 4 mmol/L (ref 3.5–5.2)
Sodium: 142 mmol/L (ref 134–144)
Total Protein: 6.7 g/dL (ref 6.0–8.5)
eGFR: 92 mL/min/1.73 (ref >59)

## 2023-12-26 LAB — LIPID PANEL W/O CHOL/HDL RATIO
Cholesterol, Total: 88 mg/dL — ABNORMAL LOW (ref 100–199)
HDL: 41 mg/dL (ref >39)
LDL Chol Calc (NIH): 15 mg/dL (ref 0–99)
Triglycerides: 207 mg/dL — ABNORMAL HIGH (ref 0–149)
VLDL Cholesterol Cal: 32 mg/dL (ref 5–40)

## 2023-12-26 NOTE — Progress Notes (Signed)
 Contacted via MyChart  Good morning Catherine Hinton, your labs have returned: - Kidney function, creatinine and eGFR, remains normal, as is liver function, AST and ALT.  - Lipid panel overall remains stable with exception of some elevation in triglycerides.  No medication changes needed.  Continue all current medications.  Any questions? Keep being amazing!!  Thank you for allowing me to participate in your care.  I appreciate you. Kindest regards, Rhylei Mcquaig

## 2024-02-06 DIAGNOSIS — E119 Type 2 diabetes mellitus without complications: Secondary | ICD-10-CM | POA: Diagnosis not present

## 2024-02-06 DIAGNOSIS — H04123 Dry eye syndrome of bilateral lacrimal glands: Secondary | ICD-10-CM | POA: Diagnosis not present

## 2024-02-06 DIAGNOSIS — H353131 Nonexudative age-related macular degeneration, bilateral, early dry stage: Secondary | ICD-10-CM | POA: Diagnosis not present

## 2024-02-06 DIAGNOSIS — H26492 Other secondary cataract, left eye: Secondary | ICD-10-CM | POA: Diagnosis not present

## 2024-02-06 DIAGNOSIS — H40013 Open angle with borderline findings, low risk, bilateral: Secondary | ICD-10-CM | POA: Diagnosis not present

## 2024-02-06 LAB — HM DIABETES EYE EXAM

## 2024-03-06 DIAGNOSIS — H04123 Dry eye syndrome of bilateral lacrimal glands: Secondary | ICD-10-CM | POA: Diagnosis not present

## 2024-03-06 DIAGNOSIS — H401131 Primary open-angle glaucoma, bilateral, mild stage: Secondary | ICD-10-CM | POA: Diagnosis not present

## 2024-03-06 DIAGNOSIS — E119 Type 2 diabetes mellitus without complications: Secondary | ICD-10-CM | POA: Diagnosis not present

## 2024-03-06 DIAGNOSIS — H353131 Nonexudative age-related macular degeneration, bilateral, early dry stage: Secondary | ICD-10-CM | POA: Diagnosis not present

## 2024-03-06 DIAGNOSIS — H02883 Meibomian gland dysfunction of right eye, unspecified eyelid: Secondary | ICD-10-CM | POA: Diagnosis not present

## 2024-03-06 DIAGNOSIS — H26492 Other secondary cataract, left eye: Secondary | ICD-10-CM | POA: Diagnosis not present

## 2024-03-06 DIAGNOSIS — H02886 Meibomian gland dysfunction of left eye, unspecified eyelid: Secondary | ICD-10-CM | POA: Diagnosis not present

## 2024-03-20 ENCOUNTER — Other Ambulatory Visit: Payer: Self-pay | Admitting: Nurse Practitioner

## 2024-03-21 NOTE — Patient Instructions (Signed)

## 2024-03-23 NOTE — Telephone Encounter (Signed)
 Too soon for refill, LRF 12/12/23 FOR 90 AND 1 RF.  Requested Prescriptions  Pending Prescriptions Disp Refills   gabapentin  (NEURONTIN ) 300 MG capsule [Pharmacy Med Name: GABAPENTIN  300 MG Oral Capsule] 530 capsule 1    Sig: TAKE 2 CAPSULES EVERY MORNING, 2 CAPSULES AT 2PM, AND 3 CAPSULES EVERY NIGHT BEFORE BEDTIME     Neurology: Anticonvulsants - gabapentin  Passed - 03/23/2024 10:45 AM      Passed - Cr in normal range and within 360 days    Creatinine, Ser  Date Value Ref Range Status  12/25/2023 0.64 0.57 - 1.00 mg/dL Final         Passed - Completed PHQ-2 or PHQ-9 in the last 360 days      Passed - Valid encounter within last 12 months    Recent Outpatient Visits           2 months ago Type 2 diabetes mellitus with diabetic polyneuropathy, without long-term current use of insulin (HCC)   [place] [facility] [place], [place] T, NP   6 months ago Type 2 diabetes mellitus with diabetic polyneuropathy, without long-term current use of insulin (HCC)   [place] [facility] [place], [place] T, NP   7 months ago Acute pain of right knee   [place] [facility] [place] SQUIBB, MD

## 2024-03-27 ENCOUNTER — Encounter: Payer: Self-pay | Admitting: Nurse Practitioner

## 2024-03-27 ENCOUNTER — Ambulatory Visit: Admitting: Nurse Practitioner

## 2024-03-27 VITALS — BP 108/70 | HR 83 | Temp 98.6°F | Ht 62.8 in | Wt 213.6 lb

## 2024-03-27 DIAGNOSIS — M85851 Other specified disorders of bone density and structure, right thigh: Secondary | ICD-10-CM | POA: Diagnosis not present

## 2024-03-27 DIAGNOSIS — Z23 Encounter for immunization: Secondary | ICD-10-CM | POA: Diagnosis not present

## 2024-03-27 DIAGNOSIS — E1142 Type 2 diabetes mellitus with diabetic polyneuropathy: Secondary | ICD-10-CM | POA: Diagnosis not present

## 2024-03-27 DIAGNOSIS — E119 Type 2 diabetes mellitus without complications: Secondary | ICD-10-CM | POA: Diagnosis not present

## 2024-03-27 DIAGNOSIS — Z1231 Encounter for screening mammogram for malignant neoplasm of breast: Secondary | ICD-10-CM | POA: Diagnosis not present

## 2024-03-27 DIAGNOSIS — E785 Hyperlipidemia, unspecified: Secondary | ICD-10-CM | POA: Diagnosis not present

## 2024-03-27 DIAGNOSIS — Z0000 Encounter for general adult medical examination without abnormal findings: Secondary | ICD-10-CM

## 2024-03-27 DIAGNOSIS — G4733 Obstructive sleep apnea (adult) (pediatric): Secondary | ICD-10-CM

## 2024-03-27 DIAGNOSIS — E1159 Type 2 diabetes mellitus with other circulatory complications: Secondary | ICD-10-CM

## 2024-03-27 DIAGNOSIS — Z7984 Long term (current) use of oral hypoglycemic drugs: Secondary | ICD-10-CM | POA: Diagnosis not present

## 2024-03-27 DIAGNOSIS — I152 Hypertension secondary to endocrine disorders: Secondary | ICD-10-CM | POA: Diagnosis not present

## 2024-03-27 DIAGNOSIS — E1169 Type 2 diabetes mellitus with other specified complication: Secondary | ICD-10-CM

## 2024-03-27 DIAGNOSIS — E538 Deficiency of other specified B group vitamins: Secondary | ICD-10-CM

## 2024-03-27 DIAGNOSIS — E0590 Thyrotoxicosis, unspecified without thyrotoxic crisis or storm: Secondary | ICD-10-CM

## 2024-03-27 DIAGNOSIS — M1A3610 Chronic gout due to renal impairment, right knee, without tophus (tophi): Secondary | ICD-10-CM | POA: Diagnosis not present

## 2024-03-27 LAB — BAYER DCA HB A1C WAIVED: HB A1C (BAYER DCA - WAIVED): 6.3 % — ABNORMAL HIGH (ref 4.8–5.6)

## 2024-03-27 MED ORDER — GABAPENTIN 300 MG PO CAPS: ORAL_CAPSULE | ORAL | 4 refills | Status: AC

## 2024-03-27 MED ORDER — VALSARTAN-HYDROCHLOROTHIAZIDE 80-12.5 MG PO TABS: 1.0000 | ORAL_TABLET | Freq: Every day | ORAL | 4 refills | Status: AC

## 2024-03-27 MED ORDER — ROSUVASTATIN CALCIUM 20 MG PO TABS: 20.0000 mg | ORAL_TABLET | Freq: Every day | ORAL | 4 refills | Status: AC

## 2024-03-27 MED ORDER — ALLOPURINOL 300 MG PO TABS: 300.0000 mg | ORAL_TABLET | Freq: Every day | ORAL | 4 refills | Status: AC

## 2024-03-27 MED ORDER — METOPROLOL SUCCINATE ER 50 MG PO TB24: 50.0000 mg | ORAL_TABLET | Freq: Every day | ORAL | 4 refills | Status: AC

## 2024-03-27 MED ORDER — METFORMIN HCL ER 500 MG PO TB24: ORAL_TABLET | ORAL | 4 refills | Status: AC

## 2024-03-27 MED ORDER — DULOXETINE HCL 60 MG PO CPEP: 60.0000 mg | ORAL_CAPSULE | Freq: Every day | ORAL | 4 refills | Status: AC

## 2024-03-27 NOTE — Assessment & Plan Note (Signed)
Chronic, stable.  Continue nightly use, praised for this.

## 2024-03-27 NOTE — Assessment & Plan Note (Signed)
 Chronic, stable.  BP well below goal for age today. Continue Valsartan -HCTZ 80 MG-12.5 MG daily and Metoprolol  XL 50 MG daily due to tachycardia (thyroid  related).  LABS: CBC, CMP.  Urine ALB 150 April 2025. Could consider discontinuation of HCTZ at upcoming visits, as BP well controlled and history of gout.  DASH diet focus at home and monitor BPs, bring levels to visit.

## 2024-03-27 NOTE — Assessment & Plan Note (Signed)
 Refer to diabetes with neuropathy plan of care

## 2024-03-27 NOTE — Assessment & Plan Note (Signed)
Ongoing.  Continue daily supplement and recheck level today.  She is on chronic Metformin, which can cause lower B12 levels.  Educated her on this.  May consider monthly injections for a bit if level not improved with oral supplement. 

## 2024-03-27 NOTE — Assessment & Plan Note (Signed)
Chronic, ongoing.  Continue Allopurinol at this time, but may consider reduction in future.  Uric acid ordered today.

## 2024-03-27 NOTE — Progress Notes (Signed)
 BP 108/70   Pulse 83   Temp 98.6 F (37 C) (Oral)   Ht 5' 2.8 (1.595 m)   Wt 213 lb 9.6 oz (96.9 kg)   LMP  (LMP Unknown)   SpO2 97%   BMI 38.08 kg/m    Subjective:    Patient ID: Catherine  Hinton, female    DOB: 1946-03-05, 78 y.o.   MRN: [id number]  HPI: Catherine  Hinton is a 78 y.o. female presenting on 03/27/2024 for comprehensive medical examination. Current medical complaints include:none  She currently lives with: self Menopausal Symptoms: no  Continues on Vitamin D  daily for osteopenia.  DIABETES A1c in July was 6.8%. Takes Metformin  XR 1000 MG BID. Takes Gabapentin  & Duloxetine  for neuropathy and back pain + takes B12 supplement.  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 Saw neurology 11/13/22 to return as needed.  Lumbar laminectomy on 08/26/23 due to radiculopathy and right leg weakness. Last saw neurosurgery 11/14/23, to return as needed Hypoglycemic episodes:no Polydipsia/polyuria: no Visual disturbance: no Chest pain: no Paresthesias: no Glucose Monitoring: yes  Accucheck frequency: almost every day  Fasting glucose: 119 this morning and 123 yesterday  Post prandial:  Evening:  Before meals: Taking Insulin?: no  Long acting insulin:  Short acting insulin: Blood Pressure Monitoring: a few times a week Retinal Examination: Up to Date Foot Exam: Up to Date Diabetic Education: Completed Pneumovax: Up to Date Influenza: Up to Date Aspirin: yes   HYPERTENSION / HYPERLIPIDEMIA Continues Metoprolol , Valsartan -HCTZ, ASA and Crestor . Last sleep study 11/14/22 with moderate OSA, uses CPAP nightly. Aortic atherosclerosis noted on past imaging. Satisfied with current treatment? yes Duration of hypertension: years BP monitoring frequency: a few times a week BP range: 110-120/70 range BP medication side effects: no Duration of hyperlipidemia: years Cholesterol medication side effects: no Cholesterol  supplements: fish oil Medication compliance: good compliance Aspirin: yes Recent stressors: no Recurrent headaches: no Visual changes: no Palpitations: no Dyspnea: no Chest pain: no Lower extremity edema: a little at times Dizzy/lightheaded: no  The ASCVD Risk score (Arnett DK, et al., 2019) failed to calculate for the following reasons:   The valid total cholesterol range is 130 to 320 mg/dL  HYPERTHYROIDISM Follows with [facility] endocrinology, last visit was 09/18/23. Takes Metoprolol  for HR control.  Does have upper tremor at baseline. Recent imaging on 10/07/23 was stable. Thyroid  control status:uncontrolled Satisfied with current treatment? no Medication side effects: no Medication compliance: good compliance Recent dose adjustment:no Fatigue: no Cold intolerance: no Heat intolerance: yes Weight gain: no Weight loss: no Constipation: no Diarrhea/loose stools: no Palpitations: no Lower extremity edema: a little at times Anxiety/depressed mood: no   GOUT Continues to take Allopurinol . No recent flares. Duration:chronic Swelling: no Redness: no Trauma: no Recent dietary change or indiscretion: no Fevers: no Nausea/vomiting: no Aggravating factors: foods Alleviating factors: Allopurinol  Status:  stable Treatments attempted: Allopurinol   Depression Screen done today and results listed below:     03/27/2024   10:23 AM 09/24/2023   10:48 AM 08/12/2023    1:43 PM 06/20/2023    9:59 AM 04/23/2023    9:09 AM  Depression screen PHQ 2/9  Decreased Interest 0 0 0 0 0  Down, Depressed, Hopeless 0 0 0 0 0  PHQ - 2 Score 0 0 0 0 0  Altered sleeping 1 0 2 1 0  Tired, decreased energy 1 3 2 2  0  Change in appetite 1 2 2 1  0  Feeling bad or failure  about yourself  0 0 0 0 0  Trouble concentrating 0 0 0 0 0  Moving slowly or fidgety/restless 0 0 0 0 0  Suicidal thoughts 0 0 0 0 0  PHQ-9 Score 3 5 6 4  0  Difficult doing work/chores Somewhat difficult Not difficult at all   Somewhat difficult Not difficult at all      03/27/2024   10:24 AM 09/24/2023   10:49 AM 08/12/2023    1:47 PM 06/20/2023   10:00 AM  GAD 7 : Generalized Anxiety Score  Nervous, Anxious, on Edge 0 1 2 1   Control/stop worrying 0 0 0 1  Worry too much - different things 1 0 0 1  Trouble relaxing 1 0 2 1  Restless 0 0 2 0  Easily annoyed or irritable 0 0 0 0  Afraid - awful might happen 0 0 0 0  Total GAD 7 Score 2 1 6 4   Anxiety Difficulty Not difficult at all Somewhat difficult Somewhat difficult Somewhat difficult      04/22/2023    9:37 AM 06/20/2023    9:59 AM 08/12/2023    1:43 PM 09/24/2023   10:47 AM 03/27/2024   10:23 AM  Fall Risk  Falls in the past year? 1 1 0 1 0  Was there an injury with Fall? 0 0 0 0 0  Fall Risk Category Calculator 1 1 0 1 0  Patient at Risk for Falls Due to History of fall(s);Impaired balance/gait;Impaired mobility No Fall Risks No Fall Risks No Fall Risks No Fall Risks  Fall risk Follow up  Falls evaluation completed Falls evaluation completed Falls evaluation completed Falls evaluation completed    Functional Status Survey: Is the patient deaf or have difficulty hearing?: No Does the patient have difficulty seeing, even when wearing glasses/contacts?: No Does the patient have difficulty concentrating, remembering, or making decisions?: No Does the patient have difficulty walking or climbing stairs?: No Does the patient have difficulty dressing or bathing?: No Does the patient have difficulty doing errands alone such as visiting a doctor's office or shopping?: No  ) Past Medical History:  Past Medical History:  Diagnosis Date   Allergy    Anxiety    Arthritis    Chronic kidney disease 04/25/2015   Depression 1990   Diabetes mellitus with renal manifestation (HCC)    Diabetes mellitus without complication (HCC)    GERD (gastroesophageal reflux disease)    Glaucoma    Gout    Hyperlipidemia    Hypertension    Hypertensive CKD (chronic kidney  disease)    Lumbar disc herniation 2025   Multinodular goiter    Neuropathy    feet   Obesity    Radiculopathy of cervical region    Sleep apnea    CPAP    Surgical History:  Past Surgical History:  Procedure Laterality Date   ABDOMINAL HYSTERECTOMY     BREAST CYST ASPIRATION     CHOLECYSTECTOMY     COLONOSCOPY WITH PROPOFOL  N/A 02/01/2020   Procedure: COLONOSCOPY WITH PROPOFOL ;  Surgeon: Jinny Carmine, MD;  Location: [facility];  Service: Endoscopy;  Laterality: N/A;  Diabetic - oral meds sleep apnea priority 4   EYE SURGERY  2015   Cataract removal   HERNIA REPAIR     x 2   LUMBAR LAMINECTOMY/DECOMPRESSION MICRODISCECTOMY Right 08/26/2023   Procedure: LUMBAR LAMINECTOMY/DECOMPRESSION MICRODISCECTOMY 1 LEVEL;  Surgeon: Clois Fret, MD;  Location: [facility];  Service: Neurosurgery;  Laterality: Right;  RIGHT  L2-3 MICRODISCECTOMY   SPINE SURGERY  2025   THUMB FUSION      Medications:  Current Outpatient Medications on File Prior to Visit  Medication Sig   acetaminophen  (TYLENOL ) 500 MG tablet Take 1,000 mg by mouth every 6 (six) hours as needed.   aspirin 81 MG EC tablet Take 81 mg by mouth in the morning.   Blood Glucose Monitoring Suppl (TRUE METRIX AIR GLUCOSE METER) w/Device KIT Use to check blood sugar 2-3 times a day and document.  Bring log to visits.   Cholecalciferol 25 MCG (1000 UT) tablet Take 1,000 Units by mouth in the morning.   DHA-EPA-Flaxseed Oil-Vitamin E (THERA TEARS NUTRITION PO) Take 1 mg by mouth 2 (two) times daily.    Multiple Vitamins-Minerals (EYE VITAMINS PO) Take 1 tablet by mouth daily.   senna (SENOKOT) 8.6 MG TABS tablet Take 1 tablet (8.6 mg total) by mouth 2 (two) times daily as needed for mild constipation.   TRUE METRIX BLOOD GLUCOSE TEST test strip TEST BLOOD SUGAR 2 TO 3 TIMES DAILY AND DOCUMENT. BRING LOG TO VISITS.   TRUEplus Lancets 33G MISC Use to check blood sugar 2 to 3 times a day and document. Bring log to visits.    vitamin B-12 (CYANOCOBALAMIN ) 1000 MCG tablet Take 1,000 mcg by mouth in the morning.   XIIDRA 5 % SOLN Place 1 drop into both eyes 2 (two) times daily.    No current facility-administered medications on file prior to visit.    Allergies:  Allergies  Allergen Reactions   Niacin Itching   Sulfa Antibiotics Other (See Comments)    Fever    Social History:  Social History   Socioeconomic History   Marital status: Divorced    Spouse name: Not on file   Number of children: 3   Years of education: Not on file   Highest education level: Associate degree: occupational, Scientist, product/process development, or vocational program  Occupational History   Occupation: retired  Tobacco Use   Smoking status: Never   Smokeless tobacco: Never  Vaping Use   Vaping status: Never Used  Substance and Sexual Activity   Alcohol use: Never   Drug use: Never   Sexual activity: Not Currently  Other Topics Concern   Not on file  Social History Narrative   Lives alone   Social Drivers of Health   Financial Resource Strain: Medium Risk (12/24/2023)   Overall Financial Resource Strain (CARDIA)    Difficulty of Paying Living Expenses: Somewhat hard  Food Insecurity: No Food Insecurity (12/24/2023)   Hunger Vital Sign    Worried About Running Out of Food in the Last Year: Never true    Ran Out of Food in the Last Year: Never true  Transportation Needs: No Transportation Needs (12/24/2023)   PRAPARE - Administrator, Civil Service (Medical): No    Lack of Transportation (Non-Medical): No  Physical Activity: Insufficiently Active (12/24/2023)   Exercise Vital Sign    Days of Exercise per Week: 2 days    Minutes of Exercise per Session: 10 min  Stress: No Stress Concern Present (12/24/2023)   [employer] of Occupational Health - Occupational Stress Questionnaire    Feeling of Stress: Only a little  Social Connections: Moderately Isolated (12/24/2023)   Social Connection and Isolation Panel    Frequency of  Communication with Friends and Family: Twice a week    Frequency of Social Gatherings with Friends and Family: Once a week    Attends  Religious Services: More than 4 times per year    Active Member of Clubs or Organizations: No    Attends Banker Meetings: Not on file    Marital Status: Divorced  Intimate Partner Violence: Not At Risk (03/27/2024)   Humiliation, Afraid, Rape, and Kick questionnaire    Fear of Current or Ex-Partner: No    Emotionally Abused: No    Physically Abused: No    Sexually Abused: No   Social History   Tobacco Use  Smoking Status Never  Smokeless Tobacco Never   Social History   Substance and Sexual Activity  Alcohol Use Never    Family History:  Family History  Problem Relation Age of Onset   Diabetes Mother    Cancer Mother        Pituitary tumor   Hyperlipidemia Mother    Hypertension Mother    Thyroid  disease Mother    Stroke Mother    Cancer Father        Lung   Cancer Brother        Oral   Cancer Maternal Grandmother        colon   Blindness Maternal Grandfather    Vision loss Maternal Grandfather    Arthritis Paternal Grandmother    Sleep apnea Daughter    Arthritis Daughter        RA   Sleep apnea Daughter    Hypertension Son    Breast cancer Neg Hx     Past medical history, surgical history, medications, allergies, family history and social history reviewed with patient today and changes made to appropriate areas of the chart.   ROS All other ROS negative except what is listed above and in the HPI.      Objective:    BP 108/70   Pulse 83   Temp 98.6 F (37 C) (Oral)   Ht 5' 2.8 (1.595 m)   Wt 213 lb 9.6 oz (96.9 kg)   LMP  (LMP Unknown)   SpO2 97%   BMI 38.08 kg/m   Wt Readings from Last 3 Encounters:  03/27/24 213 lb 9.6 oz (96.9 kg)  12/25/23 211 lb 9.6 oz (96 kg)  11/14/23 214 lb (97.1 kg)    Physical Exam Vitals and nursing note reviewed. Exam conducted with a chaperone present.   Constitutional:      General: She is awake. She is not in acute distress.    Appearance: She is well-developed and well-groomed. She is obese. She is not ill-appearing or toxic-appearing.  HENT:     Head: Normocephalic and atraumatic.     Right Ear: Hearing, tympanic membrane, ear canal and external ear normal. No drainage.     Left Ear: Hearing, tympanic membrane, ear canal and external ear normal. No drainage.     Nose: Nose normal.     Right Sinus: No maxillary sinus tenderness or frontal sinus tenderness.     Left Sinus: No maxillary sinus tenderness or frontal sinus tenderness.     Mouth/Throat:     Mouth: Mucous membranes are moist.     Pharynx: Oropharynx is clear. Uvula midline. No pharyngeal swelling, oropharyngeal exudate or posterior oropharyngeal erythema.  Eyes:     General: Lids are normal.        Right eye: No discharge.        Left eye: No discharge.     Extraocular Movements: Extraocular movements intact.     Conjunctiva/sclera: Conjunctivae normal.     Pupils: Pupils are  equal, round, and reactive to light.     Visual Fields: Right eye visual fields normal and left eye visual fields normal.  Neck:     Thyroid : No thyromegaly.     Vascular: No carotid bruit.     Trachea: Trachea normal.  Cardiovascular:     Rate and Rhythm: Normal rate and regular rhythm.     Heart sounds: Normal heart sounds. No murmur heard.    No gallop.  Pulmonary:     Effort: Pulmonary effort is normal. No accessory muscle usage or respiratory distress.     Breath sounds: Normal breath sounds.  Chest:  Breasts:    Right: Normal.     Left: Normal.  Abdominal:     General: Bowel sounds are normal.     Palpations: Abdomen is soft. There is no hepatomegaly or splenomegaly.     Tenderness: There is no abdominal tenderness.  Musculoskeletal:        General: Normal range of motion.     Cervical back: Normal range of motion and neck supple.     Right lower leg: No edema.     Left lower leg:  No edema.  Lymphadenopathy:     Head:     Right side of head: No submental, submandibular, tonsillar, preauricular or posterior auricular adenopathy.     Left side of head: No submental, submandibular, tonsillar, preauricular or posterior auricular adenopathy.     Cervical: No cervical adenopathy.     Upper Body:     Right upper body: No supraclavicular, axillary or pectoral adenopathy.     Left upper body: No supraclavicular, axillary or pectoral adenopathy.  Skin:    General: Skin is warm and dry.     Capillary Refill: Capillary refill takes less than 2 seconds.     Findings: No rash.  Neurological:     Mental Status: She is alert and oriented to person, place, and time.     Gait: Gait is intact.     Deep Tendon Reflexes: Reflexes are normal and symmetric.     Reflex Scores:      Brachioradialis reflexes are 2+ on the right side and 2+ on the left side.      Patellar reflexes are 2+ on the right side and 2+ on the left side. Psychiatric:        Attention and Perception: Attention normal.        Mood and Affect: Mood normal.        Speech: Speech normal.        Behavior: Behavior normal. Behavior is cooperative.        Thought Content: Thought content normal.        Judgment: Judgment normal.    Diabetic Foot Exam - Simple   Simple Foot Form Visual Inspection No deformities, no ulcerations, no other skin breakdown bilaterally: Yes Sensation Testing See comments: Yes Pulse Check Posterior Tibialis and Dorsalis pulse intact bilaterally: Yes Comments Sensation 5/10 both feet.     Results for orders placed or performed in visit on 02/12/24  HM DIABETES EYE EXAM   Collection Time: 02/06/24  8:58 AM  Result Value Ref Range   HM Diabetic Eye Exam No Retinopathy No Retinopathy      Assessment & Plan:   Problem List Items Addressed This Visit       Cardiovascular and Mediastinum   Hypertension associated with diabetes (HCC)   Chronic, stable.  BP well below goal for age  today. Continue Valsartan -HCTZ 80  MG-12.5 MG daily and Metoprolol  XL 50 MG daily due to tachycardia (thyroid  related).  LABS: CBC, CMP.  Urine ALB 150 April 2025. Could consider discontinuation of HCTZ at upcoming visits, as BP well controlled and history of gout.  DASH diet focus at home and monitor BPs, bring levels to visit.        Relevant Medications   metFORMIN  (GLUCOPHAGE -XR) 500 MG 24 hr tablet   metoprolol  succinate (TOPROL -XL) 50 MG 24 hr tablet   rosuvastatin  (CRESTOR ) 20 MG tablet   valsartan -hydrochlorothiazide  (DIOVAN -HCT) 80-12.5 MG tablet   Other Relevant Orders   Bayer DCA Hb A1c Waived   CBC with Differential/Platelet   Comprehensive metabolic panel with GFR     Respiratory   Obstructive sleep apnea   Chronic, stable.  Continue nightly use, praised for this.          Endocrine   Type 2 diabetes mellitus with diabetic polyneuropathy, without long-term current use of insulin (HCC) - Primary   Chronic, ongoing.  A1c 6.3% today, well at goal. Urine ALB 150 (April 2025).  Will continue Metformin  XR 1000 MG BID and discussed with her may need to adjust regimen in future.  Focus on diabetic diet at home and regular exercise.  Continue Gabapentin  daily for neuropathy, 900 MG at HS and 600 MG in morning + 600 MG at evening time + Duloxetine  60 MG daily.  Monitor kidney function with this regimen. Recommend she monitor feet closely for any wounds or skin breakdown, alert provider immediately if present.  Continue collaboration with neurology.  Return in 3 months.  May need to consider pain management or Lyrica if worsening pain. - Eye exam and foot exam up to date - Vaccines up to date - Statin and ARB on board      Relevant Medications   DULoxetine  (CYMBALTA ) 60 MG capsule   gabapentin  (NEURONTIN ) 300 MG capsule   metFORMIN  (GLUCOPHAGE -XR) 500 MG 24 hr tablet   rosuvastatin  (CRESTOR ) 20 MG tablet   valsartan -hydrochlorothiazide  (DIOVAN -HCT) 80-12.5 MG tablet   Other Relevant  Orders   Bayer DCA Hb A1c Waived   Hyperthyroidism   Chronic, ongoing.  Currently no medication.  Followed by endocrinology.  Continue this, recent notes reviewed.      Relevant Medications   metoprolol  succinate (TOPROL -XL) 50 MG 24 hr tablet   Hyperlipidemia associated with type 2 diabetes mellitus (HCC)   Chronic, stable.  Tolerating Crestor , will continue this and adjust as needed.  Lipid panel today.      Relevant Medications   metFORMIN  (GLUCOPHAGE -XR) 500 MG 24 hr tablet   metoprolol  succinate (TOPROL -XL) 50 MG 24 hr tablet   rosuvastatin  (CRESTOR ) 20 MG tablet   valsartan -hydrochlorothiazide  (DIOVAN -HCT) 80-12.5 MG tablet   Other Relevant Orders   Bayer DCA Hb A1c Waived   Comprehensive metabolic panel with GFR   Lipid Panel w/o Chol/HDL Ratio   Diabetes mellitus treated with oral medication (HCC)   Refer to diabetes with neuropathy plan of care.      Relevant Medications   metFORMIN  (GLUCOPHAGE -XR) 500 MG 24 hr tablet   rosuvastatin  (CRESTOR ) 20 MG tablet   valsartan -hydrochlorothiazide  (DIOVAN -HCT) 80-12.5 MG tablet   Other Relevant Orders   Bayer DCA Hb A1c Waived     Musculoskeletal and Integument   Osteopenia   Chronic, ongoing.  Noted on DEXA.  Continue Vitamin D  supplement and check level today.  DEXA in 2026 next.      Relevant Orders   VITAMIN D  25 Hydroxy (Vit-D  Deficiency, Fractures)     Other   Morbid obesity (HCC)   BMI 38.08.  Recommended eating smaller high protein, low fat meals more frequently and exercising 30 mins a day 5 times a week with a goal of 10-15lb weight loss in the next 3 months. Patient voiced their understanding and motivation to adhere to these recommendations.       Relevant Medications   metFORMIN  (GLUCOPHAGE -XR) 500 MG 24 hr tablet   Gout   Chronic, ongoing.  Continue Allopurinol  at this time, but may consider reduction in future.  Uric acid ordered today.      Relevant Orders   Uric acid   B12 deficiency   Ongoing.   Continue daily supplement and recheck level today.  She is on chronic Metformin , which can cause lower B12 levels.  Educated her on this.  May consider monthly injections for a bit if level not improved with oral supplement.      Relevant Orders   Vitamin B12   Other Visit Diagnoses       Need for influenza vaccination       Relevant Orders   Flu vaccine HIGH DOSE PF(Fluzone Trivalent)     Encounter for screening mammogram for malignant neoplasm of breast       Mammogram ordered.   Relevant Orders   MM 3D SCREENING MAMMOGRAM BILATERAL BREAST     Encounter for annual physical exam       Annual physical today, health maintenance reviewed.        Follow up plan: Return in about 6 months (around 09/25/2024) for T2DM, HTN/HLD, Hyperthyroid.   LABORATORY TESTING:  - Pap smear: not applicable  IMMUNIZATIONS:   - Tdap: Tetanus vaccination status reviewed: last tetanus booster within 10 years. - Influenza: Up to date - Pneumovax: Up to date - Prevnar: Up to date - COVID: Up to date - HPV: Not applicable - Shingrix  vaccine: Up to date  SCREENING: -Mammogram: Up to date  - Colonoscopy: Not applicable  - Bone Density: Up to date  -Hearing Test: Not applicable  -Spirometry: Not applicable   PATIENT COUNSELING:   Advised to take 1 mg of folate supplement per day if capable of pregnancy.   Sexuality: Discussed sexually transmitted diseases, partner selection, use of condoms, avoidance of unintended pregnancy  and contraceptive alternatives.   Advised to avoid cigarette smoking.  I discussed with the patient that most people either abstain from alcohol or drink within safe limits (<=14/week and <=4 drinks/occasion for males, <=7/weeks and <= 3 drinks/occasion for females) and that the risk for alcohol disorders and other health effects rises proportionally with the number of drinks per week and how often a drinker exceeds daily limits.  Discussed cessation/primary prevention of  drug use and availability of treatment for abuse.   Diet: Encouraged to adjust caloric intake to maintain  or achieve ideal body weight, to reduce intake of dietary saturated fat and total fat, to limit sodium intake by avoiding high sodium foods and not adding table salt, and to maintain adequate dietary potassium and calcium  preferably from fresh fruits, vegetables, and low-fat dairy products.    Stressed the importance of regular exercise  Injury prevention: Discussed safety belts, safety helmets, smoke detector, smoking near bedding or upholstery.   Dental health: Discussed importance of regular tooth brushing, flossing, and dental visits.    NEXT PREVENTATIVE PHYSICAL DUE IN 1 YEAR. Return in about 6 months (around 09/25/2024) for T2DM, HTN/HLD, Hyperthyroid.

## 2024-03-27 NOTE — Assessment & Plan Note (Signed)
 Chronic, ongoing.  A1c 6.3% today, well at goal. Urine ALB 150 (April 2025).  Will continue Metformin  XR 1000 MG BID and discussed with her may need to adjust regimen in future.  Focus on diabetic diet at home and regular exercise.  Continue Gabapentin  daily for neuropathy, 900 MG at HS and 600 MG in morning + 600 MG at evening time + Duloxetine  60 MG daily.  Monitor kidney function with this regimen. Recommend she monitor feet closely for any wounds or skin breakdown, alert provider immediately if present.  Continue collaboration with neurology.  Return in 3 months.  May need to consider pain management or Lyrica if worsening pain. - Eye exam and foot exam up to date - Vaccines up to date - Statin and ARB on board

## 2024-03-27 NOTE — Assessment & Plan Note (Signed)
 Chronic, stable.  Tolerating Crestor, will continue this and adjust as needed.  Lipid panel today.

## 2024-03-27 NOTE — Assessment & Plan Note (Signed)
 BMI 38.08.  Recommended eating smaller high protein, low fat meals more frequently and exercising 30 mins a day 5 times a week with a goal of 10-15lb weight loss in the next 3 months. Patient voiced their understanding and motivation to adhere to these recommendations.

## 2024-03-27 NOTE — Assessment & Plan Note (Signed)
 Chronic, ongoing.  Currently no medication.  Followed by endocrinology.  Continue this, recent notes reviewed.

## 2024-03-27 NOTE — Assessment & Plan Note (Signed)
Chronic, ongoing.  Noted on DEXA.  Continue Vitamin D supplement and check level today.  DEXA in 2026 next.

## 2024-03-28 ENCOUNTER — Ambulatory Visit: Payer: Self-pay | Admitting: Nurse Practitioner

## 2024-03-28 NOTE — Progress Notes (Signed)
 Contacted via MyChart  Good evening Niels, your labs have returned: - CBC shows no anemia or infection. - Kidney function, creatinine and eGFR, remains stable, as is liver function, AST and ALT.  - Lipid panel continues to show an LDL well below goal for stroke prevention. - Remainder of labs normal.  Waiting on Vitamin D  level, if abnormal will let you know.  Any questions? Keep being amazing!!  Thank you for allowing me to participate in your care.  I appreciate you. Kindest regards, Johnie Stadel

## 2024-03-29 LAB — CBC WITH DIFFERENTIAL/PLATELET
Basophils Absolute: 0 x10E3/uL (ref 0.0–0.2)
Basos: 1 %
EOS (ABSOLUTE): 0.2 x10E3/uL (ref 0.0–0.4)
Eos: 4 %
Hematocrit: 40.9 % (ref 34.0–46.6)
Hemoglobin: 12.6 g/dL (ref 11.1–15.9)
Immature Grans (Abs): 0 x10E3/uL (ref 0.0–0.1)
Immature Granulocytes: 0 %
Lymphocytes Absolute: 2 x10E3/uL (ref 0.7–3.1)
Lymphs: 38 %
MCH: 26.8 pg (ref 26.6–33.0)
MCHC: 30.8 g/dL — ABNORMAL LOW (ref 31.5–35.7)
MCV: 87 fL (ref 79–97)
Monocytes Absolute: 0.4 x10E3/uL (ref 0.1–0.9)
Monocytes: 8 %
Neutrophils Absolute: 2.6 x10E3/uL (ref 1.4–7.0)
Neutrophils: 49 %
Platelets: 231 x10E3/uL (ref 150–450)
RBC: 4.71 x10E6/uL (ref 3.77–5.28)
RDW: 14.4 % (ref 11.7–15.4)
WBC: 5.3 x10E3/uL (ref 3.4–10.8)

## 2024-03-29 LAB — LIPID PANEL W/O CHOL/HDL RATIO
Cholesterol, Total: 78 mg/dL — ABNORMAL LOW (ref 100–199)
HDL: 38 mg/dL — ABNORMAL LOW (ref >39)
LDL Chol Calc (NIH): 7 mg/dL (ref 0–99)
Triglycerides: 223 mg/dL — ABNORMAL HIGH (ref 0–149)
VLDL Cholesterol Cal: 33 mg/dL (ref 5–40)

## 2024-03-29 LAB — COMPREHENSIVE METABOLIC PANEL WITH GFR
ALT: 25 IU/L (ref 0–32)
AST: 34 IU/L (ref 0–40)
Albumin: 4.5 g/dL (ref 3.8–4.8)
Alkaline Phosphatase: 56 IU/L (ref 49–135)
BUN/Creatinine Ratio: 24 (ref 12–28)
BUN: 13 mg/dL (ref 8–27)
Bilirubin Total: 0.4 mg/dL (ref 0.0–1.2)
CO2: 19 mmol/L — ABNORMAL LOW (ref 20–29)
Calcium: 9.5 mg/dL (ref 8.7–10.3)
Chloride: 98 mmol/L (ref 96–106)
Creatinine, Ser: 0.54 mg/dL — ABNORMAL LOW (ref 0.57–1.00)
Globulin, Total: 2.1 g/dL (ref 1.5–4.5)
Glucose: 123 mg/dL — ABNORMAL HIGH (ref 70–99)
Potassium: 4.3 mmol/L (ref 3.5–5.2)
Sodium: 139 mmol/L (ref 134–144)
Total Protein: 6.6 g/dL (ref 6.0–8.5)
eGFR: 95 mL/min/1.73

## 2024-03-29 LAB — URIC ACID: Uric Acid: 4.2 mg/dL (ref 3.1–7.9)

## 2024-03-29 LAB — VITAMIN D 25 HYDROXY (VIT D DEFICIENCY, FRACTURES): Vit D, 25-Hydroxy: 44.3 ng/mL (ref 30.0–100.0)

## 2024-03-29 LAB — VITAMIN B12: Vitamin B-12: 552 pg/mL (ref 232–1245)

## 2024-04-28 ENCOUNTER — Ambulatory Visit: Payer: Self-pay

## 2024-04-28 VITALS — BP 112/68 | Ht 63.0 in | Wt 216.4 lb

## 2024-04-28 DIAGNOSIS — Z23 Encounter for immunization: Secondary | ICD-10-CM

## 2024-04-28 DIAGNOSIS — Z0000 Encounter for general adult medical examination without abnormal findings: Secondary | ICD-10-CM

## 2024-04-28 NOTE — Progress Notes (Signed)
 Subjective:   Catherine Hinton is a 78 y.o. female who presents for a Medicare Annual Wellness Visit.  Allergies (verified) Niacin and Sulfa antibiotics   History: Past Medical History:  Diagnosis Date   Allergy    Anxiety    Arthritis    Chronic kidney disease 04/25/2015   Depression 1990   Diabetes mellitus with renal manifestation (HCC)    Diabetes mellitus without complication (HCC)    GERD (gastroesophageal reflux disease)    Glaucoma    Gout    Hyperlipidemia    Hypertension    Hypertensive CKD (chronic kidney disease)    Lumbar disc herniation 2025   Multinodular goiter    Neuropathy    feet   Obesity    Radiculopathy of cervical region    Sleep apnea    CPAP   Past Surgical History:  Procedure Laterality Date   ABDOMINAL HYSTERECTOMY     BREAST CYST ASPIRATION     CHOLECYSTECTOMY     COLONOSCOPY WITH PROPOFOL  N/A 02/01/2020   Procedure: COLONOSCOPY WITH PROPOFOL ;  Surgeon: Jinny Carmine, MD;  Location: [facility];  Service: Endoscopy;  Laterality: N/A;  Diabetic - oral meds sleep apnea priority 4   EYE SURGERY  2015   Cataract removal   HERNIA REPAIR     x 2   LUMBAR LAMINECTOMY/DECOMPRESSION MICRODISCECTOMY Right 08/26/2023   Procedure: LUMBAR LAMINECTOMY/DECOMPRESSION MICRODISCECTOMY 1 LEVEL;  Surgeon: Clois Fret, MD;  Location: [facility];  Service: Neurosurgery;  Laterality: Right;  RIGHT L2-3 MICRODISCECTOMY   SPINE SURGERY  2025   THUMB FUSION     Family History  Problem Relation Age of Onset   Diabetes Mother    Cancer Mother        Pituitary tumor   Hyperlipidemia Mother    Hypertension Mother    Thyroid  disease Mother    Stroke Mother    Cancer Father        Lung   Cancer Brother        Oral   Cancer Maternal Grandmother        colon   Blindness Maternal Grandfather    Vision loss Maternal Grandfather    Arthritis Paternal Grandmother    Sleep apnea Daughter    Arthritis Daughter        RA   Sleep apnea Daughter     Hypertension Son    Breast cancer Neg Hx    Social History   Occupational History   Occupation: retired  Tobacco Use   Smoking status: Never   Smokeless tobacco: Never  Vaping Use   Vaping status: Never Used  Substance and Sexual Activity   Alcohol use: Never   Drug use: Never   Sexual activity: Not Currently   Tobacco Counseling Counseling given: Not Answered  SDOH Screenings   Food Insecurity: No Food Insecurity (04/28/2024)  Housing: Low Risk  (04/28/2024)  Transportation Needs: No Transportation Needs (04/28/2024)  Utilities: Not At Risk (04/28/2024)  Alcohol Screen: Low Risk  (04/22/2023)  Depression (PHQ2-9): Low Risk  (04/28/2024)  Financial Resource Strain: Medium Risk (12/24/2023)  Physical Activity: Inactive (04/28/2024)  Social Connections: Moderately Isolated (04/28/2024)  Stress: Stress Concern Present (04/28/2024)  Tobacco Use: Low Risk  (04/28/2024)  Health Literacy: Adequate Health Literacy (04/28/2024)   Depression Screen    04/28/2024   10:22 AM 03/27/2024   10:23 AM 09/24/2023   10:48 AM 08/12/2023    1:43 PM 06/20/2023    9:59 AM 04/23/2023    9:09 AM  03/12/2023   10:01 AM  PHQ 2/9 Scores  PHQ - 2 Score 0 0 0 0 0 0 0  PHQ- 9 Score 3 3  5  6  4   0  4      Data saved with a previous flowsheet row definition      Goals Addressed               This Visit's Progress     lose weight (pt-stated)         Visit info / Clinical Intake: Medicare Wellness Visit Type:: Subsequent Annual Wellness Visit Persons participating in visit:: patient Medicare Wellness Visit Mode:: In-person (required for WTM) Information given by:: patient Interpreter Needed?: No Pre-visit prep was completed: yes AWV questionnaire completed by patient prior to visit?: no Living arrangements:: (!) lives alone Patient's Overall Health Status Rating: (!) fair Typical amount of pain: some Does pain affect daily life?: (!) yes Are you currently prescribed opioids?:  no  Dietary Habits and Nutritional Risks How many meals a day?: 2 Eats fruit and vegetables daily?: yes Most meals are obtained by: preparing own meals In the last 2 weeks, have you had any of the following?: none Diabetic:: (!) yes Any non-healing wounds?: no How often do you check your BS?: 1 Would you like to be referred to a Nutritionist or for Diabetic Management? : no  Functional Status Activities of Daily Living (to include ambulation/medication): Independent Ambulation: Independent with device- listed below Home Assistive Devices/Equipment: Eyeglasses; Other (Comment) (CPAP) Medication Administration: Independent Home Management: Independent Manage your own finances?: yes Primary transportation is: driving Concerns about vision?: (!) yes (dry eyes) Concerns about hearing?: no  Fall Screening Falls in the past year?: 0 Number of falls in past year: 0 Was there an injury with Fall?: 0 Fall Risk Category Calculator: 0 Patient Fall Risk Level: Low Fall Risk  Fall Risk Patient at Risk for Falls Due to: No Fall Risks Fall risk Follow up: Falls evaluation completed  Home and Transportation Safety: All rugs have non-skid backing?: yes All stairs or steps have railings?: yes Grab bars in the bathtub or shower?: yes Have non-skid surface in bathtub or shower?: yes Good home lighting?: yes Regular seat belt use?: yes Hospital stays in the last year:: no  Cognitive Assessment Difficulty concentrating, remembering, or making decisions? : yes Will 6CIT or Mini Cog be Completed: yes What year is it?: 0 points What month is it?: 0 points Give patient an address phrase to remember (5 components): [address] [place] About what time is it?: 0 points Count backwards from 20 to 1: 0 points Say the months of the year in reverse: 0 points Repeat the address phrase from earlier: 0 points 6 CIT Score: 0 points  Advance Directives (For Healthcare) Does Patient Have a  Medical Advance Directive?: No Would patient like information on creating a medical advance directive?: No - Patient declined  Reviewed/Updated  Reviewed/Updated: Reviewed All (Medical, Surgical, Family, Medications, Allergies, Care Teams, Patient Goals)        Objective:    Today's Vitals   04/28/24 1005  BP: 112/68  Weight: 216 lb 6.4 oz (98.2 kg)  Height: 5' 3 (1.6 m)   Body mass index is 38.33 kg/m.  Current Medications (verified) Outpatient Encounter Medications as of 04/28/2024  Medication Sig   acetaminophen  (TYLENOL ) 500 MG tablet Take 1,000 mg by mouth every 6 (six) hours as needed.   allopurinol  (ZYLOPRIM ) 300 MG tablet Take 1 tablet (  300 mg total) by mouth daily.   aspirin 81 MG EC tablet Take 81 mg by mouth in the morning.   Blood Glucose Monitoring Suppl (TRUE METRIX AIR GLUCOSE METER) w/Device KIT Use to check blood sugar 2-3 times a day and document.  Bring log to visits.   Cholecalciferol 25 MCG (1000 UT) tablet Take 1,000 Units by mouth in the morning.   DHA-EPA-Flaxseed Oil-Vitamin E (THERA TEARS NUTRITION PO) Take 1 mg by mouth 2 (two) times daily.    DULoxetine  (CYMBALTA ) 60 MG capsule Take 1 capsule (60 mg total) by mouth daily.   gabapentin  (NEURONTIN ) 300 MG capsule TAKE 2 CAPSULES EVERY MORNING, AND 2 CAPSULES AT 2PM, AND 3 CAPSULES EVERY NIGHT BEFORE BEDTIME   metFORMIN  (GLUCOPHAGE -XR) 500 MG 24 hr tablet TAKE 2 TABLETS EVERY MORNING  AND TAKE 2 TABLETS AT BEDTIME   metoprolol  succinate (TOPROL -XL) 50 MG 24 hr tablet Take 1 tablet (50 mg total) by mouth daily. Take with or immediately following a meal.   Multiple Vitamins-Minerals (EYE VITAMINS PO) Take 1 tablet by mouth daily.   rosuvastatin  (CRESTOR ) 20 MG tablet Take 1 tablet (20 mg total) by mouth daily.   senna (SENOKOT) 8.6 MG TABS tablet Take 1 tablet (8.6 mg total) by mouth 2 (two) times daily as needed for mild constipation.   TRUE METRIX BLOOD GLUCOSE TEST test strip TEST BLOOD SUGAR 2 TO 3  TIMES DAILY AND DOCUMENT. BRING LOG TO VISITS.   TRUEplus Lancets 33G MISC Use to check blood sugar 2 to 3 times a day and document. Bring log to visits.   valsartan -hydrochlorothiazide  (DIOVAN -HCT) 80-12.5 MG tablet Take 1 tablet by mouth daily.   vitamin B-12 (CYANOCOBALAMIN ) 1000 MCG tablet Take 1,000 mcg by mouth in the morning.   XIIDRA 5 % SOLN Place 1 drop into both eyes 2 (two) times daily.    No facility-administered encounter medications on file as of 04/28/2024.   Hearing/Vision screen Hearing Screening - Comments:: Denies hearing loss  Vision Screening - Comments:: UTD @ Dr. Robynn Mevelyn [place] Immunizations and Health Maintenance Health Maintenance  Topic Date Due   FOOT EXAM  03/11/2024   Mammogram  05/27/2024   Diabetic kidney evaluation - Urine ACR  09/23/2024   HEMOGLOBIN A1C  09/25/2024   COVID-19 Vaccine (7 - 2025-26 season) 10/26/2024   DEXA SCAN  02/02/2025   OPHTHALMOLOGY EXAM  02/05/2025   Diabetic kidney evaluation - eGFR measurement  03/27/2025   Medicare Annual Wellness (AWV)  04/28/2025   DTaP/Tdap/Td (3 - Td or Tdap) 12/09/2032   Pneumococcal Vaccine: 50+ Years  Completed   Influenza Vaccine  Completed   Hepatitis C Screening  Completed   Zoster Vaccines- Shingrix   Completed   Meningococcal B Vaccine  Aged Out   Colonoscopy  Discontinued        Assessment/Plan:  This is a routine wellness examination for [place] .  Patient Care Team: Cannady, [name] T, NP as PCP - General (Nurse Practitioner) Mevelyn JONETTA Robynn, OD (Optometry) Jonette Lauraine FORBES DEVONNA (Physician Assistant) Hilma Hastings, PA-C as Physician Assistant (Neurosurgery) Clois Fret, MD as Consulting Physician (Neurosurgery) Standley Dorothyann LABOR, MD (Endocrinology)  I have personally reviewed and noted the following in the patient's chart:   Medical and social history Use of alcohol, tobacco or illicit drugs  Current medications and supplements including opioid  prescriptions. Functional ability and status Nutritional status Physical activity Advanced directives List of other physicians Hospitalizations, surgeries, and ER visits in previous 12 months Vitals Screenings  to include cognitive, depression, and falls Referrals and appointments  Orders Placed This Encounter  Procedures   Pfizer Comirnaty Covid -19 Vaccine 64yrs and older   In addition, I have reviewed and discussed with patient certain preventive protocols, quality metrics, and best practice recommendations. A written personalized care plan for preventive services as well as general preventive health recommendations were provided to patient.   Vina Ned, CMA   04/28/2024   Return in 1 year (on 04/28/2025).  After Visit Summary: (In Person-Printed) AVS printed and given to the patient  Nurse Notes:  FBS this morning per patient was 123 Gave Covid vaccine at today's visit Needs DM foot exam at next OV Declined referral to DM & Nutrition education Gave ph# to schedule MMG (due ~ 05/27/24)

## 2024-04-28 NOTE — Patient Instructions (Signed)
 Ms. Luthi,  Thank you for taking the time for your Medicare Wellness Visit. I appreciate your continued commitment to your health goals. Please review the care plan we discussed, and feel free to reach out if I can assist you further.  Please note that Annual Wellness Visits do not include a physical exam. Some assessments may be limited, especially if the visit was conducted virtually. If needed, we may recommend an in-person follow-up with your provider.  Ongoing Care Seeing your primary care provider every 3 to 6 months helps us  monitor your health and provide consistent, personalized care.   Referrals If a referral was made during today's visit and you haven't received any updates within two weeks, please contact the referred provider directly to check on the status.  Recommended Screenings:  Please call to schedule your mammogram (due after 05/27/24):  [facility] Address: [address], [place], [place] Phone: [phone]  [facility] [address], [address] [place], [place] [address] Phone: [phone]   Health Maintenance  Topic Date Due   Complete foot exam   03/11/2024   Medicare Annual Wellness Visit  04/22/2024   Breast Cancer Screening  05/27/2024   Yearly kidney health urinalysis for diabetes  09/23/2024   Hemoglobin A1C  09/25/2024   COVID-19 Vaccine (7 - 2025-26 season) 10/26/2024   DEXA scan (bone density measurement)  02/02/2025   Eye exam for diabetics  02/05/2025   Yearly kidney function blood test for diabetes  03/27/2025   DTaP/Tdap/Td vaccine (3 - Td or Tdap) 12/09/2032   Pneumococcal Vaccine for age over 66  Completed   Flu Shot  Completed   Hepatitis C Screening  Completed   Zoster (Shingles) Vaccine  Completed   Meningitis B Vaccine  Aged Out   Colon Cancer Screening  Discontinued       04/28/2024   10:11 AM  Advanced Directives  Does Patient Have a Medical Advance  Directive? No  Would patient like information on creating a medical advance directive? No - Patient declined    Vision: Annual vision screenings are recommended for early detection of glaucoma, cataracts, and diabetic retinopathy. These exams can also reveal signs of chronic conditions such as diabetes and high blood pressure.  Dental: Annual dental screenings help detect early signs of oral cancer, gum disease, and other conditions linked to overall health, including heart disease and diabetes.  Please see the attached documents for additional preventive care recommendations.   Fall Prevention in the Home, Adult Falls can cause injuries and affect people of all ages. There are many simple things that you can do to make your home safe and to help prevent falls. If you need it, ask for help making these changes. What actions can I take to prevent falls? General information Use good lighting in all rooms. Make sure to: Replace any light bulbs that burn out. Turn on lights if it is dark and use night-lights. Keep items that you use often in easy-to-reach places. Lower the shelves around your home if needed. Move furniture so that there are clear paths around it. Do not keep throw rugs or other things on the floor that can make you trip. If any of your floors are uneven, fix them. Add color or contrast paint or tape to clearly mark and help you see: Grab bars or handrails. First and last steps of staircases. Where the edge of each step is. If you use a ladder  or stepladder: Make sure that it is fully opened. Do not climb a closed ladder. Make sure the sides of the ladder are locked in place. Have someone hold the ladder while you use it. Know where your pets are as you move through your home. What can I do in the bathroom?     Keep the floor dry. Clean up any water  that is on the floor right away. Remove soap buildup in the bathtub or shower. Buildup makes bathtubs and showers  slippery. Use non-skid mats or decals on the floor of the bathtub or shower. Attach bath mats securely with double-sided, non-slip rug tape. If you need to sit down while you are in the shower, use a non-slip stool. Install grab bars by the toilet and in the bathtub and shower. Do not use towel bars as grab bars. What can I do in the bedroom? Make sure that you have a light by your bed that is easy to reach. Do not use any sheets or blankets on your bed that hang to the floor. Have a firm bench or chair with side arms that you can use for support when you get dressed. What can I do in the kitchen? Clean up any spills right away. If you need to reach something above you, use a sturdy step stool that has a grab bar. Keep electrical cables out of the way. Do not use floor polish or wax that makes floors slippery. What can I do with my stairs? Do not leave anything on the stairs. Make sure that you have a light switch at the top and the bottom of the stairs. Have them installed if you do not have them. Make sure that there are handrails on both sides of the stairs. Fix handrails that are broken or loose. Make sure that handrails are as long as the staircases. Install non-slip stair treads on all stairs in your home if they do not have carpet. Avoid having throw rugs at the top or bottom of stairs, or secure the rugs with carpet tape to prevent them from moving. Choose a carpet design that does not hide the edge of steps on the stairs. Make sure that carpet is firmly attached to the stairs. Fix any carpet that is loose or worn. What can I do on the outside of my home? Use bright outdoor lighting. Repair the edges of walkways and driveways and fix any cracks. Clear paths of anything that can make you trip, such as tools or rocks. Add color or contrast paint or tape to clearly mark and help you see high doorway thresholds. Trim any bushes or trees on the main path into your home. Check that  handrails are securely fastened and in good repair. Both sides of all steps should have handrails. Install guardrails along the edges of any raised decks or porches. Have leaves, snow, and ice cleared regularly. Use sand, salt, or ice melt on walkways during winter months if you live where there is ice and snow. In the garage, clean up any spills right away, including grease or oil spills. What other actions can I take? Review your medicines with your health care provider. Some medicines can make you confused or feel dizzy. This can increase your chance of falling. Wear closed-toe shoes that fit well and support your feet. Wear shoes that have rubber soles and low heels. Use a cane, walker, scooter, or crutches that help you move around if needed. Talk with your provider about other ways  that you can decrease your risk of falls. This may include seeing a physical therapist to learn to do exercises to improve movement and strength. Where to find more information Centers for Disease Control and Prevention, STEADI: tonerpromos.no [employer] on Aging: baseringtones.pl National Institute on Aging: baseringtones.pl Contact a health care provider if: You are afraid of falling at home. You feel weak, drowsy, or dizzy at home. You fall at home. Get help right away if you: Lose consciousness or have trouble moving after a fall. Have a fall that causes a head injury. These symptoms may be an emergency. Get help right away. Call 911. Do not wait to see if the symptoms will go away. Do not drive yourself to the hospital. This information is not intended to replace advice given to you by your health care provider. Make sure you discuss any questions you have with your health care provider. Document Revised: 02/05/2022 Document Reviewed: 02/05/2022 Elsevier Patient Education  2024 [employer].

## 2024-05-23 ENCOUNTER — Emergency Department
Admission: EM | Admit: 2024-05-23 | Discharge: 2024-05-23 | Disposition: A | Discharge status: Home / Self Care | Attending: Emergency Medicine | Admitting: Emergency Medicine

## 2024-05-23 ENCOUNTER — Other Ambulatory Visit: Payer: Self-pay

## 2024-05-23 ENCOUNTER — Emergency Department

## 2024-05-23 ENCOUNTER — Encounter: Payer: Self-pay | Admitting: Emergency Medicine

## 2024-05-23 DIAGNOSIS — E1122 Type 2 diabetes mellitus with diabetic chronic kidney disease: Secondary | ICD-10-CM | POA: Insufficient documentation

## 2024-05-23 DIAGNOSIS — N189 Chronic kidney disease, unspecified: Secondary | ICD-10-CM | POA: Diagnosis not present

## 2024-05-23 DIAGNOSIS — N132 Hydronephrosis with renal and ureteral calculous obstruction: Secondary | ICD-10-CM | POA: Diagnosis not present

## 2024-05-23 DIAGNOSIS — N200 Calculus of kidney: Secondary | ICD-10-CM

## 2024-05-23 DIAGNOSIS — I129 Hypertensive chronic kidney disease with stage 1 through stage 4 chronic kidney disease, or unspecified chronic kidney disease: Secondary | ICD-10-CM | POA: Insufficient documentation

## 2024-05-23 LAB — LIPASE, BLOOD: Lipase: 39 U/L (ref 11–51)

## 2024-05-23 LAB — CBC
HCT: 41.4 % (ref 36.0–46.0)
Hemoglobin: 13 g/dL (ref 12.0–15.0)
MCH: 26.4 pg (ref 26.0–34.0)
MCHC: 31.4 g/dL (ref 30.0–36.0)
MCV: 84.1 fL (ref 80.0–100.0)
Platelets: 294 K/uL (ref 150–400)
RBC: 4.92 MIL/uL (ref 3.87–5.11)
RDW: 15.3 % (ref 11.5–15.5)
WBC: 7 K/uL (ref 4.0–10.5)
nRBC: 0 % (ref 0.0–0.2)

## 2024-05-23 LAB — URINALYSIS, ROUTINE W REFLEX MICROSCOPIC
Bilirubin Urine: NEGATIVE
Glucose, UA: NEGATIVE mg/dL
Ketones, ur: NEGATIVE mg/dL
Nitrite: NEGATIVE
Protein, ur: 100 mg/dL — AB
RBC / HPF: >50 RBC/hpf (ref 0–5)
Specific Gravity, Urine: 1.018 (ref 1.005–1.030)
pH: 5 (ref 5.0–8.0)

## 2024-05-23 LAB — COMPREHENSIVE METABOLIC PANEL WITH GFR
ALT: 30 U/L (ref 0–44)
AST: 38 U/L (ref 15–41)
Albumin: 4.5 g/dL (ref 3.5–5.0)
Alkaline Phosphatase: 55 U/L (ref 38–126)
Anion gap: 15 (ref 5–15)
BUN: 12 mg/dL (ref 8–23)
CO2: 26 mmol/L (ref 22–32)
Calcium: 9.6 mg/dL (ref 8.9–10.3)
Chloride: 98 mmol/L (ref 98–111)
Creatinine, Ser: 0.77 mg/dL (ref 0.44–1.00)
GFR, Estimated: >60 mL/min (ref >60)
Glucose, Bld: 188 mg/dL — ABNORMAL HIGH (ref 70–99)
Potassium: 4 mmol/L (ref 3.5–5.1)
Sodium: 139 mmol/L (ref 135–145)
Total Bilirubin: 0.6 mg/dL (ref 0.0–1.2)
Total Protein: 7 g/dL (ref 6.5–8.1)

## 2024-05-23 LAB — CBG MONITORING, ED: Glucose-Capillary: 149 mg/dL — ABNORMAL HIGH (ref 70–99)

## 2024-05-23 MED ORDER — IOHEXOL 300 MG/ML  SOLN
100.0000 mL | Freq: Once | INTRAMUSCULAR | Status: AC | PRN
  Administered 2024-05-23: 100 mL via INTRAVENOUS

## 2024-05-23 MED ORDER — MORPHINE SULFATE (PF) 4 MG/ML IV SOLN
4.0000 mg | Freq: Once | INTRAVENOUS | Status: AC
  Administered 2024-05-23: 4 mg via INTRAVENOUS
  Filled 2024-05-23: qty 1

## 2024-05-23 MED ORDER — ONDANSETRON 4 MG PO TBDP: 4.0000 mg | ORAL_TABLET | Freq: Three times a day (TID) | ORAL | 0 refills | Status: AC | PRN

## 2024-05-23 MED ORDER — OXYCODONE-ACETAMINOPHEN 5-325 MG PO TABS: 1.0000 | ORAL_TABLET | ORAL | 0 refills | Status: AC | PRN

## 2024-05-23 MED ORDER — LACTATED RINGERS IV BOLUS
1000.0000 mL | Freq: Once | INTRAVENOUS | Status: AC
  Administered 2024-05-23: 1000 mL via INTRAVENOUS

## 2024-05-23 MED ORDER — OXYCODONE-ACETAMINOPHEN 5-325 MG PO TABS
1.0000 | ORAL_TABLET | Freq: Once | ORAL | Status: AC
  Administered 2024-05-23: 1 via ORAL
  Filled 2024-05-23: qty 1

## 2024-05-23 MED ORDER — ONDANSETRON HCL 4 MG/2ML IJ SOLN
4.0000 mg | Freq: Once | INTRAMUSCULAR | Status: AC
  Administered 2024-05-23: 4 mg via INTRAVENOUS
  Filled 2024-05-23: qty 2

## 2024-05-23 NOTE — ED Triage Notes (Signed)
 Patient c/o right lower abdominal pain onset of this morning with nausea. Reports pink tinged urine since yesterday.

## 2024-05-23 NOTE — ED Provider Notes (Signed)
 [facility] Provider Note    Event Date/Time   First MD Initiated Contact with Patient 05/23/24 1322     (approximate)   History   Chief Complaint Abdominal Pain   HPI  Catherine  Hinton is a 78 y.o. female with past medical history of hypertension, hyperlipidemia, diabetes, and CKD who presents to the ED complaining of abdominal pain.  Patient reports that she has had worsening pain in the right lower quadrant of her abdomen since getting up this morning.  She describes the pain now as sharp and severe, not exacerbated or alleviated by anything in particular.  She has been feeling nauseous but denies any vomiting and has not had any changes in her bowel movements.  She does report a pink tinge to her urine but has not had any dysuria or flank pain.  She denies any history of similar symptoms.     Physical Exam   Triage Vital Signs: ED Triage Vitals  Encounter Vitals Group     BP 05/23/24 1315 (!) 171/97     Girls Systolic BP Percentile --      Girls Diastolic BP Percentile --      Boys Systolic BP Percentile --      Boys Diastolic BP Percentile --      Pulse Rate 05/23/24 1315 88     Resp 05/23/24 1315 20     Temp 05/23/24 1314 97.9 F (36.6 C)     Temp Source 05/23/24 1314 Oral     SpO2 05/23/24 1315 99 %     Weight 05/23/24 1314 216 lb (98 kg)     Height 05/23/24 1314 5' 3 (1.6 m)     Head Circumference --      Peak Flow --      Pain Score 05/23/24 1314 9     Pain Loc --      Pain Education --      Exclude from Growth Chart --     Most recent vital signs: Vitals:   05/23/24 1314 05/23/24 1315  BP:  (!) 171/97  Pulse:  88  Resp:  20  Temp: 97.9 F (36.6 C)   SpO2:  99%    Constitutional: Alert and oriented. Eyes: Conjunctivae are normal. Head: Atraumatic. Nose: No congestion/rhinnorhea. Mouth/Throat: Mucous membranes are moist.  Cardiovascular: Normal rate, regular rhythm. Grossly normal heart sounds.  2+ radial pulses  bilaterally. Respiratory: Normal respiratory effort.  No retractions. Lungs CTAB. Gastrointestinal: Soft and tender to palpation in the right lower quadrant with no rebound or guarding.  No CVA tenderness bilaterally. No distention. Musculoskeletal: No lower extremity tenderness nor edema.  Neurologic:  Normal speech and language. No gross focal neurologic deficits are appreciated.    ED Results / Procedures / Treatments   Labs (all labs ordered are listed, but only abnormal results are displayed) Labs Reviewed  COMPREHENSIVE METABOLIC PANEL WITH GFR - Abnormal; Notable for the following components:      Result Value   Glucose, Bld 188 (*)    All other components within normal limits  URINALYSIS, ROUTINE W REFLEX MICROSCOPIC - Abnormal; Notable for the following components:   Color, Urine AMBER (*)    APPearance CLOUDY (*)    Hgb urine dipstick MODERATE (*)    Protein, ur 100 (*)    Leukocytes,Ua TRACE (*)    Bacteria, UA RARE (*)    All other components within normal limits  CBG MONITORING, ED - Abnormal; Notable for the following components:  Glucose-Capillary 149 (*)    All other components within normal limits  LIPASE, BLOOD  CBC    RADIOLOGY CT abdomen/pelvis reviewed and interpreted by me with stone in the right proximal ureter.  PROCEDURES:  Critical Care performed: No  Procedures   MEDICATIONS ORDERED IN ED: Medications  oxyCODONE -acetaminophen  (PERCOCET/ROXICET) 5-325 MG per tablet 1 tablet (has no administration in time range)  morphine  (PF) 4 MG/ML injection 4 mg (4 mg Intravenous Given 05/23/24 1403)  ondansetron  (ZOFRAN ) injection 4 mg (4 mg Intravenous Given 05/23/24 1404)  lactated ringers  bolus 1,000 mL (1,000 mLs Intravenous New Bag/Given 05/23/24 1412)  iohexol  (OMNIPAQUE ) 300 MG/ML solution 100 mL (100 mLs Intravenous Contrast Given 05/23/24 1410)     IMPRESSION / MDM / ASSESSMENT AND PLAN / ED COURSE  I reviewed the triage vital signs and the  nursing notes.                              78 y.o. female with past medical history of hypertension, hyperlipidemia, diabetes, and CKD who presents to the ED complaining of worsening right lower quadrant abdominal pain since she got up this morning.  Patient's presentation is most consistent with acute presentation with potential threat to life or bodily function.  Differential diagnosis includes, but is not limited to, appendicitis, kidney stone, hernia, UTI, bowel obstruction, colitis, diverticulitis.  Patient uncomfortable appearing but nontoxic and in no acute distress, vital signs are unremarkable.  Her abdomen is soft but she does have focal tenderness in the right lower quadrant, will further assess with CT imaging.  Labs and urinalysis are pending at this time, will treat symptomatically with IV morphine  and Zofran , hydrate with IV fluid.  CT imaging shows 5 mm stone in the right proximal ureter with associated hydronephrosis.  Urinalysis with hematuria but no signs of infection and patient reports feeling better on reassessment.  Labs without significant anemia, leukocytosis, electrolyte abnormality, or AKI.  LFTs are also unremarkable.  Patient appropriate for outpatient management with urology follow-up, was counseled to return to the ED for new or worsening symptoms.  Patient and daughter agree with plan.      FINAL CLINICAL IMPRESSION(S) / ED DIAGNOSES   Final diagnoses:  Kidney stone     Rx / DC Orders   ED Discharge Orders          Ordered    oxyCODONE -acetaminophen  (PERCOCET) 5-325 MG tablet  Every 4 hours PRN        05/23/24 1508    ondansetron  (ZOFRAN -ODT) 4 MG disintegrating tablet  Every 8 hours PRN        05/23/24 1508             Note:  This document was prepared using Dragon voice recognition software and may include unintentional dictation errors.   Willo Dunnings, MD 05/23/24 [phone]

## 2024-05-25 LAB — URINE CULTURE: Culture: NO GROWTH

## 2024-05-27 ENCOUNTER — Other Ambulatory Visit: Payer: Self-pay | Admitting: *Deleted

## 2024-05-27 ENCOUNTER — Ambulatory Visit
Admission: RE | Admit: 2024-05-27 | Discharge: 2024-05-27 | Disposition: A | Discharge status: Home / Self Care | Source: Ambulatory Visit | Attending: Urology | Admitting: Urology

## 2024-05-27 ENCOUNTER — Encounter: Payer: Self-pay | Admitting: Urology

## 2024-05-27 ENCOUNTER — Ambulatory Visit: Admitting: Urology

## 2024-05-27 VITALS — BP 118/69 | HR 69 | Ht 63.0 in | Wt 204.0 lb

## 2024-05-27 DIAGNOSIS — N200 Calculus of kidney: Secondary | ICD-10-CM

## 2024-05-27 MED ORDER — SILODOSIN 8 MG PO CAPS: 8.0000 mg | ORAL_CAPSULE | Freq: Every day | ORAL | 0 refills | Status: AC

## 2024-05-27 NOTE — Progress Notes (Signed)
 05/27/2024 11:13 AM   Catherine  Hinton [birth year] [id number]  Referring provider: Valerio Melanie DASEN, NP [address] [place],  [place] [address]  Chief Complaint  Patient presents with   Nephrolithiasis    HPI: Catherine  Hinton is a 78 y.o. female presents in follow-up after recent ED visit for renal colic.  [facility] ED visit 05/23/2024 with complaints of right lower quadrant abdominal pain starting the morning of her visit.  Some radiation of the pain to her right flank region.  No fever or chills.  She had nausea without vomiting.  No bothersome LUTS though did note pink-tinged urine ED evaluation remarkable for UA showing >50 RBC.  Urine culture was negative.  CT abdomen pelvis with contrast showed a 5 mm right proximal ureteral calculus with mild hydronephrosis and bilateral, nonobstructing or renal calculi.  Pain improved with parenteral analgesics/antiemetics and she was discharged on Zofran  and oxycodone  Since her ED visit pain has been controlled with oxycodone  No previous history of stone disease   PMH: Past Medical History:  Diagnosis Date   Allergy    Anxiety    Arthritis    Chronic kidney disease 04/25/2015   Depression 1990   Diabetes mellitus with renal manifestation (HCC)    Diabetes mellitus without complication (HCC)    GERD (gastroesophageal reflux disease)    Glaucoma    Gout    Hyperlipidemia    Hypertension    Hypertensive CKD (chronic kidney disease)    Lumbar disc herniation 2025   Multinodular goiter    Neuropathy    feet   Obesity    Radiculopathy of cervical region    Sleep apnea    CPAP    Surgical History: Past Surgical History:  Procedure Laterality Date   ABDOMINAL HYSTERECTOMY     BREAST CYST ASPIRATION     CHOLECYSTECTOMY     COLONOSCOPY WITH PROPOFOL  N/A 02/01/2020   Procedure: COLONOSCOPY WITH PROPOFOL ;  Surgeon: Jinny Carmine, MD;  Location: [facility];  Service: Endoscopy;  Laterality: N/A;  Diabetic - oral meds sleep  apnea priority 4   EYE SURGERY  2015   Cataract removal   HERNIA REPAIR     x 2   LUMBAR LAMINECTOMY/DECOMPRESSION MICRODISCECTOMY Right 08/26/2023   Procedure: LUMBAR LAMINECTOMY/DECOMPRESSION MICRODISCECTOMY 1 LEVEL;  Surgeon: Clois Fret, MD;  Location: [facility];  Service: Neurosurgery;  Laterality: Right;  RIGHT L2-3 MICRODISCECTOMY   SPINE SURGERY  2025   THUMB FUSION      Home Medications:  Allergies as of 05/27/2024       Reactions   Niacin Itching   Sulfa Antibiotics Other (See Comments)   Fever        Medication List        Accurate as of May 27, 2024 11:13 AM. If you have any questions, ask your nurse or doctor.          acetaminophen  500 MG tablet Commonly known as: TYLENOL  Take 1,000 mg by mouth every 6 (six) hours as needed.   allopurinol  300 MG tablet Commonly known as: ZYLOPRIM  Take 1 tablet (300 mg total) by mouth daily.   aspirin EC 81 MG tablet Take 81 mg by mouth in the morning.   Cholecalciferol 25 MCG (1000 UT) tablet Take 1,000 Units by mouth in the morning.   cyanocobalamin  1000 MCG tablet Commonly known as: VITAMIN B12 Take 1,000 mcg by mouth in the morning.   DULoxetine  60 MG capsule Commonly known as: CYMBALTA  Take 1 capsule (60 mg  total) by mouth daily.   EYE VITAMINS PO Take 1 tablet by mouth daily.   gabapentin  300 MG capsule Commonly known as: NEURONTIN  TAKE 2 CAPSULES EVERY MORNING, AND 2 CAPSULES AT 2PM, AND 3 CAPSULES EVERY NIGHT BEFORE BEDTIME   metFORMIN  500 MG 24 hr tablet Commonly known as: GLUCOPHAGE -XR TAKE 2 TABLETS EVERY MORNING  AND TAKE 2 TABLETS AT BEDTIME   metoprolol  succinate 50 MG 24 hr tablet Commonly known as: TOPROL -XL Take 1 tablet (50 mg total) by mouth daily. Take with or immediately following a meal.   ondansetron  4 MG disintegrating tablet Commonly known as: ZOFRAN -ODT Take 1 tablet (4 mg total) by mouth every 8 (eight) hours as needed for nausea or vomiting.    oxyCODONE -acetaminophen  5-325 MG tablet Commonly known as: Percocet Take 1 tablet by mouth every 4 (four) hours as needed.   rosuvastatin  20 MG tablet Commonly known as: Crestor  Take 1 tablet (20 mg total) by mouth daily.   senna 8.6 MG Tabs tablet Commonly known as: SENOKOT Take 1 tablet (8.6 mg total) by mouth 2 (two) times daily as needed for mild constipation.   THERA TEARS NUTRITION PO Take 1 mg by mouth 2 (two) times daily.   True Metrix Air Glucose Meter w/Device Kit Use to check blood sugar 2-3 times a day and document.  Bring log to visits.   True Metrix Blood Glucose Test test strip Generic drug: glucose blood TEST BLOOD SUGAR 2 TO 3 TIMES DAILY AND DOCUMENT. BRING LOG TO VISITS.   TRUEplus Lancets 33G Misc Use to check blood sugar 2 to 3 times a day and document. Bring log to visits.   valsartan -hydrochlorothiazide  80-12.5 MG tablet Commonly known as: DIOVAN -HCT Take 1 tablet by mouth daily.   Xiidra 5 % Soln Generic drug: Lifitegrast Place 1 drop into both eyes 2 (two) times daily.        Allergies:  Allergies  Allergen Reactions   Niacin Itching   Sulfa Antibiotics Other (See Comments)    Fever    Family History: Family History  Problem Relation Age of Onset   Diabetes Mother    Cancer Mother        Pituitary tumor   Hyperlipidemia Mother    Hypertension Mother    Thyroid  disease Mother    Stroke Mother    Cancer Father        Lung   Cancer Brother        Oral   Cancer Maternal Grandmother        colon   Blindness Maternal Grandfather    Vision loss Maternal Grandfather    Arthritis Paternal Grandmother    Sleep apnea Daughter    Arthritis Daughter        RA   Sleep apnea Daughter    Hypertension Son    Breast cancer Neg Hx     Social History:  reports that she has never smoked. She has never used smokeless tobacco. She reports that she does not drink alcohol and does not use drugs.   Physical Exam: BP 118/69   Pulse 69   Ht  5' 3 (1.6 m)   Wt 204 lb (92.5 kg)   LMP  (LMP Unknown)   BMI 36.14 kg/m   Constitutional:  Alert, No acute distress. HEENT: [place] AT Respiratory: Normal respiratory effort, no increased work of breathing. Psychiatric: Normal mood and affect.   Assessment & Plan:    1.  Right proximal ureteral calculus We discussed various treatment  options for urolithiasis including observation with or without medical expulsive therapy, shockwave lithotripsy (SWL), ureteroscopy and laser lithotripsy with stent placement. We discussed that management is based on stone size, location, density, patient co-morbidities, and patient preference.  Stones < 5mm in size have a >80% spontaneous passage rate. Data surrounding the use of tamsulosin for medical expulsive therapy is controversial, but meta analyses suggests it is most efficacious for distal stones between 5-55mm in size.  SWL has a lower stone free rate in a single procedure, but also a lower complication rate compared to ureteroscopy and avoids a stent and associated stent related symptoms. Possible complications include renal hematoma, steinstrasse, and need for additional treatment. Ureteroscopy with laser lithotripsy and stent placement has a higher stone free rate than SWL in a single procedure, however increased complication rate including possible infection, ureteral injury, bleeding, and stent related morbidity. Common stent related symptoms include dysuria, urgency/frequency, and flank pain. After an extensive discussion of the risks and benefits of the above treatment options, the patient would like to proceed with MET. KUB was ordered to see if calculus visualized on plain x-ray and to assess for any distal progression If she is unable to pass she would prefer SWL Will notify with KUB results Rx silodosin sent to pharmacy  2.  Bilateral nephrolithiasis Nonobstructing bilateral renal calculi Will discuss metabolic evaluation after treatment of  her ureteral calculus   Catherine JAYSON Barba, MD  [facility] [address] [place], [place] [address] [phone]

## 2024-06-02 ENCOUNTER — Ambulatory Visit
Admission: RE | Admit: 2024-06-02 | Discharge: 2024-06-02 | Disposition: A | Source: Ambulatory Visit | Attending: Urology | Admitting: Urology

## 2024-06-02 ENCOUNTER — Ambulatory Visit: Payer: Self-pay | Admitting: Urology

## 2024-06-02 DIAGNOSIS — N200 Calculus of kidney: Secondary | ICD-10-CM | POA: Diagnosis present

## 2024-06-04 ENCOUNTER — Other Ambulatory Visit: Payer: Self-pay | Admitting: *Deleted

## 2024-06-04 NOTE — Telephone Encounter (Signed)
 Error. Last message was not correct. I called patient today and advised to have the ct renal stone study done. Patient is going to call and scheduled.

## 2024-06-09 ENCOUNTER — Ambulatory Visit
Admission: RE | Admit: 2024-06-09 | Discharge: 2024-06-09 | Disposition: A | Discharge status: Home / Self Care | Source: Ambulatory Visit | Attending: Urology | Admitting: Urology

## 2024-06-09 ENCOUNTER — Ambulatory Visit
Admission: RE | Admit: 2024-06-09 | Discharge: 2024-06-09 | Disposition: A | Discharge status: Home / Self Care | Source: Ambulatory Visit | Attending: Nurse Practitioner | Admitting: Nurse Practitioner

## 2024-06-09 DIAGNOSIS — Z1231 Encounter for screening mammogram for malignant neoplasm of breast: Secondary | ICD-10-CM | POA: Insufficient documentation

## 2024-06-09 DIAGNOSIS — N200 Calculus of kidney: Secondary | ICD-10-CM

## 2024-06-17 NOTE — Progress Notes (Signed)
 Contacted via MyChart   Normal mammogram, may repeat in one year:)

## 2024-06-19 ENCOUNTER — Ambulatory Visit: Payer: Self-pay | Admitting: Urology

## 2024-06-19 DIAGNOSIS — N200 Calculus of kidney: Secondary | ICD-10-CM

## 2024-06-22 NOTE — Telephone Encounter (Signed)
 Appt scheduled. KUB ordered.

## 2024-09-25 ENCOUNTER — Ambulatory Visit: Admitting: Nurse Practitioner

## 2025-05-04 ENCOUNTER — Ambulatory Visit

## 2025-05-28 ENCOUNTER — Ambulatory Visit: Admitting: Urology
